# Patient Record
Sex: Male | Born: 1975 | Race: White | Hispanic: No | Marital: Married | Smoking: Never smoker
Health system: Southern US, Community
[De-identification: ages and names within clinical notes are randomized; demographics above are authoritative.]

## PROBLEM LIST (undated history)

## (undated) DIAGNOSIS — K4090 Unilateral inguinal hernia, without obstruction or gangrene, not specified as recurrent: Secondary | ICD-10-CM

## (undated) DIAGNOSIS — F32A Depression, unspecified: Secondary | ICD-10-CM

## (undated) DIAGNOSIS — K219 Gastro-esophageal reflux disease without esophagitis: Secondary | ICD-10-CM

## (undated) DIAGNOSIS — F329 Major depressive disorder, single episode, unspecified: Secondary | ICD-10-CM

## (undated) DIAGNOSIS — K589 Irritable bowel syndrome without diarrhea: Secondary | ICD-10-CM

## (undated) DIAGNOSIS — R7303 Prediabetes: Secondary | ICD-10-CM

## (undated) DIAGNOSIS — K2970 Gastritis, unspecified, without bleeding: Secondary | ICD-10-CM

## (undated) DIAGNOSIS — I739 Peripheral vascular disease, unspecified: Secondary | ICD-10-CM

## (undated) DIAGNOSIS — U071 COVID-19: Secondary | ICD-10-CM

## (undated) DIAGNOSIS — F419 Anxiety disorder, unspecified: Secondary | ICD-10-CM

## (undated) DIAGNOSIS — K529 Noninfective gastroenteritis and colitis, unspecified: Secondary | ICD-10-CM

## (undated) DIAGNOSIS — G4700 Insomnia, unspecified: Secondary | ICD-10-CM

## (undated) DIAGNOSIS — IMO0002 Reserved for concepts with insufficient information to code with codable children: Secondary | ICD-10-CM

## (undated) DIAGNOSIS — T7840XA Allergy, unspecified, initial encounter: Secondary | ICD-10-CM

## (undated) DIAGNOSIS — N419 Inflammatory disease of prostate, unspecified: Secondary | ICD-10-CM

## (undated) HISTORY — DX: Anxiety disorder, unspecified: F41.9

## (undated) HISTORY — DX: Reserved for concepts with insufficient information to code with codable children: IMO0002

## (undated) HISTORY — DX: Gilbert syndrome: E80.4

## (undated) HISTORY — DX: Gastro-esophageal reflux disease without esophagitis: K21.9

## (undated) HISTORY — DX: Allergy, unspecified, initial encounter: T78.40XA

## (undated) HISTORY — PX: SPINE SURGERY: SHX786

## (undated) HISTORY — DX: Irritable bowel syndrome, unspecified: K58.9

## (undated) HISTORY — PX: LASIK: SHX215

## (undated) HISTORY — DX: Insomnia, unspecified: G47.00

## (undated) HISTORY — DX: Gastritis, unspecified, without bleeding: K29.70

## (undated) HISTORY — PX: EYE SURGERY: SHX253

## (undated) HISTORY — DX: Inflammatory disease of prostate, unspecified: N41.9

## (undated) HISTORY — DX: Depression, unspecified: F32.A

## (undated) HISTORY — DX: Unilateral inguinal hernia, without obstruction or gangrene, not specified as recurrent: K40.90

## (undated) HISTORY — PX: BACK SURGERY: SHX140

## (undated) HISTORY — DX: Major depressive disorder, single episode, unspecified: F32.9

## (undated) HISTORY — DX: Noninfective gastroenteritis and colitis, unspecified: K52.9

---

## 1997-12-03 ENCOUNTER — Emergency Department (HOSPITAL_COMMUNITY): Admission: EM | Admit: 1997-12-03 | Discharge: 1997-12-03 | Payer: Self-pay | Admitting: Emergency Medicine

## 1999-01-11 ENCOUNTER — Encounter: Admission: RE | Admit: 1999-01-11 | Discharge: 1999-01-26 | Payer: Self-pay | Admitting: Family Medicine

## 2000-12-06 ENCOUNTER — Encounter: Payer: Self-pay | Admitting: Family Medicine

## 2000-12-06 ENCOUNTER — Encounter: Admission: RE | Admit: 2000-12-06 | Discharge: 2000-12-06 | Payer: Self-pay | Admitting: Family Medicine

## 2004-06-09 ENCOUNTER — Encounter: Admission: RE | Admit: 2004-06-09 | Discharge: 2004-06-09 | Payer: Self-pay | Admitting: Occupational Medicine

## 2004-09-20 HISTORY — PX: ESOPHAGOGASTRODUODENOSCOPY: SHX1529

## 2004-10-07 ENCOUNTER — Emergency Department (HOSPITAL_COMMUNITY): Admission: EM | Admit: 2004-10-07 | Discharge: 2004-10-08 | Payer: Self-pay | Admitting: Emergency Medicine

## 2004-10-08 ENCOUNTER — Ambulatory Visit: Payer: Self-pay | Admitting: Family Medicine

## 2004-10-10 ENCOUNTER — Emergency Department (HOSPITAL_COMMUNITY): Admission: EM | Admit: 2004-10-10 | Discharge: 2004-10-10 | Payer: Self-pay | Admitting: Emergency Medicine

## 2004-10-11 ENCOUNTER — Ambulatory Visit: Payer: Self-pay | Admitting: Family Medicine

## 2004-10-15 ENCOUNTER — Ambulatory Visit: Payer: Self-pay | Admitting: Internal Medicine

## 2004-10-19 ENCOUNTER — Ambulatory Visit (HOSPITAL_COMMUNITY): Admission: RE | Admit: 2004-10-19 | Discharge: 2004-10-19 | Payer: Self-pay | Admitting: Internal Medicine

## 2004-10-19 ENCOUNTER — Ambulatory Visit: Payer: Self-pay | Admitting: Internal Medicine

## 2004-10-20 ENCOUNTER — Ambulatory Visit: Payer: Self-pay | Admitting: Family Medicine

## 2005-02-02 ENCOUNTER — Inpatient Hospital Stay: Payer: Self-pay | Admitting: Unknown Physician Specialty

## 2005-02-03 ENCOUNTER — Encounter: Payer: Self-pay | Admitting: Gastroenterology

## 2005-02-07 ENCOUNTER — Encounter: Payer: Self-pay | Admitting: Gastroenterology

## 2005-02-08 ENCOUNTER — Encounter: Payer: Self-pay | Admitting: Gastroenterology

## 2005-02-11 ENCOUNTER — Ambulatory Visit: Payer: Self-pay | Admitting: Gastroenterology

## 2005-02-11 ENCOUNTER — Encounter: Payer: Self-pay | Admitting: Gastroenterology

## 2005-02-17 ENCOUNTER — Ambulatory Visit: Payer: Self-pay | Admitting: Family Medicine

## 2005-03-17 ENCOUNTER — Encounter: Payer: Self-pay | Admitting: Gastroenterology

## 2005-03-24 ENCOUNTER — Ambulatory Visit: Payer: Self-pay | Admitting: Gastroenterology

## 2005-03-24 ENCOUNTER — Encounter: Payer: Self-pay | Admitting: Gastroenterology

## 2005-04-26 ENCOUNTER — Encounter: Payer: Self-pay | Admitting: Gastroenterology

## 2005-04-28 ENCOUNTER — Ambulatory Visit: Payer: Self-pay | Admitting: Family Medicine

## 2005-05-27 ENCOUNTER — Ambulatory Visit: Payer: Self-pay | Admitting: Family Medicine

## 2005-06-08 ENCOUNTER — Ambulatory Visit: Payer: Self-pay | Admitting: Family Medicine

## 2005-06-09 ENCOUNTER — Ambulatory Visit: Payer: Self-pay | Admitting: Gastroenterology

## 2005-06-27 ENCOUNTER — Ambulatory Visit: Payer: Self-pay | Admitting: Family Medicine

## 2005-11-11 ENCOUNTER — Ambulatory Visit: Payer: Self-pay | Admitting: Family Medicine

## 2006-05-19 ENCOUNTER — Ambulatory Visit: Payer: Self-pay | Admitting: Family Medicine

## 2006-05-23 DIAGNOSIS — N419 Inflammatory disease of prostate, unspecified: Secondary | ICD-10-CM

## 2006-05-23 HISTORY — DX: Inflammatory disease of prostate, unspecified: N41.9

## 2006-08-07 ENCOUNTER — Emergency Department: Payer: Self-pay | Admitting: Emergency Medicine

## 2006-08-07 ENCOUNTER — Encounter: Payer: Self-pay | Admitting: Gastroenterology

## 2007-05-24 HISTORY — PX: BACK SURGERY: SHX140

## 2007-05-30 ENCOUNTER — Ambulatory Visit: Payer: Self-pay | Admitting: Gastroenterology

## 2007-05-30 LAB — CONVERTED CEMR LAB
Albumin: 4 g/dL (ref 3.5–5.2)
BUN: 14 mg/dL (ref 6–23)
Basophils Relative: 0.1 % (ref 0.0–1.0)
CO2: 31 meq/L (ref 19–32)
Calcium: 9.6 mg/dL (ref 8.4–10.5)
Chloride: 103 meq/L (ref 96–112)
Creatinine, Ser: 0.9 mg/dL (ref 0.4–1.5)
Eosinophils Relative: 1.1 % (ref 0.0–5.0)
GFR calc Af Amer: 128 mL/min
GFR calc non Af Amer: 106 mL/min
HCT: 44.5 % (ref 39.0–52.0)
Hemoglobin: 15.3 g/dL (ref 13.0–17.0)
Lipase: 20 units/L (ref 11.0–59.0)
Lymphocytes Relative: 28.1 % (ref 12.0–46.0)
Monocytes Absolute: 0.6 10*3/uL (ref 0.2–0.7)
Monocytes Relative: 11.4 % — ABNORMAL HIGH (ref 3.0–11.0)
Neutro Abs: 3.1 10*3/uL (ref 1.4–7.7)
Neutrophils Relative %: 59.3 % (ref 43.0–77.0)
TSH: 1.29 microintl units/mL (ref 0.35–5.50)
Tissue Transglutaminase Ab, IgA: 0.5 units (ref <7)

## 2007-06-10 ENCOUNTER — Encounter: Admission: RE | Admit: 2007-06-10 | Discharge: 2007-06-10 | Payer: Self-pay | Admitting: Gastroenterology

## 2007-08-30 ENCOUNTER — Ambulatory Visit: Payer: Self-pay | Admitting: Family Medicine

## 2007-08-30 ENCOUNTER — Telehealth (INDEPENDENT_AMBULATORY_CARE_PROVIDER_SITE_OTHER): Payer: Self-pay | Admitting: Internal Medicine

## 2007-08-30 DIAGNOSIS — J309 Allergic rhinitis, unspecified: Secondary | ICD-10-CM | POA: Insufficient documentation

## 2007-10-27 ENCOUNTER — Encounter: Admission: RE | Admit: 2007-10-27 | Discharge: 2007-10-27 | Payer: Self-pay | Admitting: Orthopedic Surgery

## 2007-11-13 ENCOUNTER — Encounter (INDEPENDENT_AMBULATORY_CARE_PROVIDER_SITE_OTHER): Payer: Self-pay | Admitting: Internal Medicine

## 2007-11-14 ENCOUNTER — Encounter (INDEPENDENT_AMBULATORY_CARE_PROVIDER_SITE_OTHER): Payer: Self-pay | Admitting: Internal Medicine

## 2007-11-14 DIAGNOSIS — M5126 Other intervertebral disc displacement, lumbar region: Secondary | ICD-10-CM | POA: Insufficient documentation

## 2007-11-28 ENCOUNTER — Ambulatory Visit (HOSPITAL_COMMUNITY): Admission: RE | Admit: 2007-11-28 | Discharge: 2007-11-29 | Payer: Self-pay | Admitting: Orthopedic Surgery

## 2008-03-17 ENCOUNTER — Ambulatory Visit: Payer: Self-pay | Admitting: Family Medicine

## 2008-03-21 ENCOUNTER — Telehealth: Payer: Self-pay | Admitting: Family Medicine

## 2008-05-23 ENCOUNTER — Emergency Department: Payer: Self-pay | Admitting: Unknown Physician Specialty

## 2008-07-28 ENCOUNTER — Ambulatory Visit: Payer: Self-pay | Admitting: Family Medicine

## 2008-09-01 ENCOUNTER — Ambulatory Visit: Payer: Self-pay | Admitting: Family Medicine

## 2008-09-01 DIAGNOSIS — F341 Dysthymic disorder: Secondary | ICD-10-CM | POA: Insufficient documentation

## 2008-09-02 ENCOUNTER — Encounter: Payer: Self-pay | Admitting: Family Medicine

## 2008-10-07 ENCOUNTER — Ambulatory Visit: Payer: Self-pay | Admitting: Family Medicine

## 2009-06-29 ENCOUNTER — Encounter: Payer: Self-pay | Admitting: Endocrinology

## 2009-06-29 ENCOUNTER — Encounter: Payer: Self-pay | Admitting: Family Medicine

## 2009-09-18 ENCOUNTER — Encounter: Payer: Self-pay | Admitting: Family Medicine

## 2009-09-18 ENCOUNTER — Encounter: Payer: Self-pay | Admitting: Endocrinology

## 2009-09-28 ENCOUNTER — Ambulatory Visit: Payer: Self-pay | Admitting: Gastroenterology

## 2009-09-28 ENCOUNTER — Encounter: Payer: Self-pay | Admitting: Endocrinology

## 2009-09-28 DIAGNOSIS — R1011 Right upper quadrant pain: Secondary | ICD-10-CM | POA: Insufficient documentation

## 2009-09-28 DIAGNOSIS — K580 Irritable bowel syndrome with diarrhea: Secondary | ICD-10-CM | POA: Insufficient documentation

## 2009-09-28 DIAGNOSIS — K589 Irritable bowel syndrome without diarrhea: Secondary | ICD-10-CM

## 2009-09-28 DIAGNOSIS — R197 Diarrhea, unspecified: Secondary | ICD-10-CM | POA: Insufficient documentation

## 2009-09-29 ENCOUNTER — Ambulatory Visit: Payer: Self-pay | Admitting: Family Medicine

## 2009-09-29 DIAGNOSIS — R5381 Other malaise: Secondary | ICD-10-CM | POA: Insufficient documentation

## 2009-09-29 DIAGNOSIS — W57XXXA Bitten or stung by nonvenomous insect and other nonvenomous arthropods, initial encounter: Secondary | ICD-10-CM | POA: Insufficient documentation

## 2009-09-29 DIAGNOSIS — R5383 Other fatigue: Secondary | ICD-10-CM | POA: Insufficient documentation

## 2009-09-29 DIAGNOSIS — T148 Other injury of unspecified body region: Secondary | ICD-10-CM | POA: Insufficient documentation

## 2009-10-01 ENCOUNTER — Ambulatory Visit: Payer: Self-pay | Admitting: Internal Medicine

## 2009-10-01 DIAGNOSIS — K4090 Unilateral inguinal hernia, without obstruction or gangrene, not specified as recurrent: Secondary | ICD-10-CM | POA: Insufficient documentation

## 2009-10-02 ENCOUNTER — Ambulatory Visit: Payer: Self-pay | Admitting: Urology

## 2009-10-02 ENCOUNTER — Encounter: Payer: Self-pay | Admitting: Family Medicine

## 2009-10-02 ENCOUNTER — Encounter: Payer: Self-pay | Admitting: Endocrinology

## 2009-10-07 ENCOUNTER — Encounter: Payer: Self-pay | Admitting: Endocrinology

## 2009-10-12 ENCOUNTER — Ambulatory Visit: Payer: Self-pay | Admitting: Urology

## 2009-10-12 ENCOUNTER — Encounter: Payer: Self-pay | Admitting: Endocrinology

## 2009-10-13 ENCOUNTER — Encounter: Payer: Self-pay | Admitting: Gastroenterology

## 2009-10-15 ENCOUNTER — Encounter: Payer: Self-pay | Admitting: Family Medicine

## 2009-10-26 ENCOUNTER — Ambulatory Visit: Payer: Self-pay | Admitting: Family Medicine

## 2009-10-26 DIAGNOSIS — H6690 Otitis media, unspecified, unspecified ear: Secondary | ICD-10-CM | POA: Insufficient documentation

## 2009-10-28 ENCOUNTER — Ambulatory Visit: Payer: Self-pay | Admitting: Gastroenterology

## 2009-11-02 ENCOUNTER — Encounter: Payer: Self-pay | Admitting: Endocrinology

## 2009-11-12 ENCOUNTER — Ambulatory Visit: Payer: Self-pay | Admitting: Gastroenterology

## 2009-11-13 ENCOUNTER — Ambulatory Visit: Payer: Self-pay | Admitting: Endocrinology

## 2009-11-13 DIAGNOSIS — E229 Hyperfunction of pituitary gland, unspecified: Secondary | ICD-10-CM | POA: Insufficient documentation

## 2009-12-09 ENCOUNTER — Encounter: Payer: Self-pay | Admitting: Endocrinology

## 2009-12-14 ENCOUNTER — Ambulatory Visit: Payer: Self-pay

## 2009-12-18 ENCOUNTER — Encounter: Payer: Self-pay | Admitting: Family Medicine

## 2010-01-07 ENCOUNTER — Ambulatory Visit: Payer: Self-pay | Admitting: Internal Medicine

## 2010-01-07 DIAGNOSIS — R112 Nausea with vomiting, unspecified: Secondary | ICD-10-CM | POA: Insufficient documentation

## 2010-01-13 ENCOUNTER — Encounter (INDEPENDENT_AMBULATORY_CARE_PROVIDER_SITE_OTHER): Payer: Self-pay | Admitting: *Deleted

## 2010-02-11 ENCOUNTER — Encounter: Payer: Self-pay | Admitting: Endocrinology

## 2010-05-23 HISTORY — PX: LASIK: SHX215

## 2010-06-22 NOTE — Letter (Signed)
[facility]: [facility]   Imported By: Lanelle Bal 12/25/2009 13:44:29  _____________________________________________________________________  External Attachment:    Type:   Image     Comment:   External Document

## 2010-06-22 NOTE — Letter (Signed)
Summary: [facility]   Imported By: Sherian Rein 09/29/2009 07:17:52  _____________________________________________________________________  External Attachment:    Type:   Image     Comment:   External Document

## 2010-06-22 NOTE — Letter (Signed)
Summary: [facility]   Imported By: Sherian Rein 09/29/2009 07:18:42  _____________________________________________________________________  External Attachment:    Type:   Image     Comment:   External Document

## 2010-06-22 NOTE — Assessment & Plan Note (Signed)
Summary: R EAR/CLE   Vital Signs:  Patient profile:   35 year old male Height:      73 inches Weight:      178.38 pounds BMI:     23.62 Temp:     98.7 degrees F oral Pulse rate:   80 / minute Pulse rhythm:   regular BP sitting:   102 / 62  (left arm) Cuff size:   regular  Vitals Entered By: Linde Gillis CMA Duncan Dull) (October 26, 2009 3:40 PM) CC: right ear pain   History of Present Illness: 35 yo with Right ear pain, getting worse over last several days.  Started out with runny nose and sneezing which has resolved. No fevers, cough or other symptoms. [place].  No drainage or difficulty hearing.  Current Medications (verified): 1)  Wellbutrin Xl 150 Mg Xr24h-Tab (Bupropion Hcl) .Marland Kitchen.. 1 By Mouth Once Daily 2)  Glycopyrrolate 2 Mg Tabs (Glycopyrrolate) .... One Tablet By Mouth Two Times A Day 3)  Amoxicillin 500 Mg Tabs (Amoxicillin) .Marland Kitchen.. 1 Tab By Mouth Two Times A Day X 10 Days  Allergies (verified): No Known Drug Allergies  Review of Systems      See HPI General:  Denies chills, fatigue, and fever. ENT:  Complains of earache and nasal congestion; denies ear discharge, sinus pressure, and sore throat. Resp:  Denies cough.  Physical Exam  General:  Well-developed,well-nourished,in no acute distress; alert,appropriate and cooperative throughout examination Ears:  R TM erythema and dull.   Left TM normal Nose:  no nasal discharge.   Mouth:  pharynx pink and moist.   Lungs:  Normal respiratory effort, chest expands symmetrically. Lungs are clear to auscultation, no crackles or wheezes. Heart:  Normal rate and regular rhythm. S1 and S2 normal without gallop, murmur, click, rub or other extra sounds. Psych:  nl affect    Impression & Recommendations:  Problem # 1:  OTITIS MEDIA, ACUTE, RIGHT (ICD-382.9) Assessment New Amoxicillin 500 mg two times a day x 10 days. Ibuprofen as needed pain.  Follow up if no resolution of  symptoms. The following medications were removed from the medication list:    Doxycycline Hyclate 100 Mg Caps (Doxycycline hyclate) .Marland Kitchen... 1 by mouth two times a day for 10 days His updated medication list for this problem includes:    Amoxicillin 500 Mg Tabs (Amoxicillin) .Marland Kitchen... 1 tab by mouth two times a day x 10 days  Complete Medication List: 1)  Wellbutrin Xl 150 Mg Xr24h-tab (Bupropion hcl) .Marland Kitchen.. 1 by mouth once daily 2)  Glycopyrrolate 2 Mg Tabs (Glycopyrrolate) .... One tablet by mouth two times a day 3)  Amoxicillin 500 Mg Tabs (Amoxicillin) .Marland Kitchen.. 1 tab by mouth two times a day x 10 days Prescriptions: AMOXICILLIN 500 MG TABS (AMOXICILLIN) 1 tab by mouth two times a day x 10 days  #20 x 0   Entered and Authorized by:   Ruthe Mannan MD   Signed by:   Ruthe Mannan MD on 10/26/2009   Method used:   Electronically to        [facility]* (retail)       [address]       [place], [place]  [address]       Ph: [phone]       Fax: [phone]   RxID:   [id number]   Current Allergies (reviewed today): No known allergies

## 2010-06-22 NOTE — Assessment & Plan Note (Signed)
Summary: THROWING UP AFTER HE EATS/CLE   Vital Signs:  Patient profile:   35 year old male Weight:      174.75 pounds Temp:     98.7 degrees F oral Pulse rate:   84 / minute Pulse rhythm:   regular BP sitting:   122 / 78  (left arm) Cuff size:   regular  Vitals Entered By: Selena Batten Dance CMA Duncan Dull) (January 07, 2010 3:11 PM) CC: Vomitting after eating   History of Present Illness: CC: vomiting after all food   Sick 2 wks ago, flu like sxs - body aches, nausea, diarrhea, mild cough.  No vomiting, ST, congestion, fevers.  Did get better over weekend but around 12/28/09 started getting sick after every meal.  About 15-20 min after meal feels epigastric pain, then has nausea that progresses to vomiting.  After emesis x 1 feels normal.  Emesis clear, mucousy, yellow.  Diarrhea has continued, 3x/day, yellow and watery.  No blood in stool or emesis.  has noted nausea/vomiting worse with standing, improved with sitting.  ROS: + red eyes.  No f/c/joint or muscle pain, chest pain, eye pain, skin rash/change or oral lesions.  Tolerates fluids, but unable to tolerate food.  Today for lunch had fried shrimp, yesterday dinner had chicken, vomited afterwards both times.  Thinks may be developing aversion to meals to prevent vomiting.  Appetite intact.  No sick contacts or similar sxs in family at home.  No smokers at home.    sees Dr. Russella Dar of [facility], dx with IBS.  Normally with diarrhea.  On glycopyrrolate for this.  Also on wellbutrin.  Reviewing chart, extensive GI w/u in past by multiple GI docs including: Recent abd CT unremarkable x small fat containing R inguinal hernia (09/2009).  Recent blood work WNL x Tbili 1.4 (09/2009).  Per GI note, colonoscopy 2006 normal with normal random biopsies.  Normal ANCA, ANA and anti- tissue transglutaminase Ab in 2009.  Allergies (verified): No Known Drug Allergies  Past History:  Past Medical History: depression/ anxiety insomnia   gastritis gerd gilbert's syndrome (hyperbilirubinemia) prostatitis 08 deg disk disease  [name]- Dr Shon Baton  urology- Achilles Dunk  counselor   Social History: Reviewed history from 09/28/2009 and no changes required. married  no smoking  no alcohol Illicit Drug Use - no Occupation: Pharmacologist  Review of Systems       per hpi  Physical Exam  General:  Well-developed,well-nourished,in no acute distress; alert,appropriate and cooperative throughout examination.  vitals reviewed, afebrile, normal, nontoxic Eyes:  + conjunctival injection Mouth:  Oral mucosa and oropharynx without lesions or exudates.  Teeth in good repair. MMM Lungs:  Normal respiratory effort, chest expands symmetrically. Lungs are clear to auscultation, no crackles or wheezes. Heart:  Normal rate and regular rhythm. S1 and S2 normal without gallop, murmur, click, rub or other extra sounds. Abdomen:  hyperactive BS, soft, NTND, no masses appreciated, no HSM Pulses:  2+ periph pulses Extremities:  No clubbing, cyanosis, edema, or deformity noted with normal full range of motion of all joints.   Skin:  no rash Psych:  nl affect    Impression & Recommendations:  Problem # 1:  NAUSEA AND VOMITING (ICD-787.01) Assessment New Vitals stable.  nontoxic today.  No red flags on exam.  Treat symptomatically with zofran, phenergan in case cannot afford zofran.  RTC in 1 wk to see if improvement.  If not, rec f/u with Dr. Russella Dar for recheck.  Extensive  GI w/u in past, consider functional nausea/vomiting vs gastroparesis vs other.  Will need to verify not using MJ next visit.  will have pt return for labwork.  Both wellbutrin and glycopyrrolate have side effects of n/v, however not consistent with current presentation.  Centricity down today, but recommended pt start diary of emesis and foods he ate prior.  To see if any foods precipitate vomiting, and if any foods better  tolerated.  Rec also stick to bland foods.  To call us if cannot keep meds down, could change to suppositories.  Complete Medication List: 1)  Wellbutrin Xl 150 Mg Xr24h-tab (Bupropion hcl) .Marland Kitchen.. 1 by mouth once daily 2)  Glycopyrrolate 2 Mg Tabs (Glycopyrrolate) .Marland Kitchen.. 1 1/2 tablet by mouth once daily as needed for abdominal pain 3)  Zofran 4 Mg Tabs (Ondansetron hcl) .... One q4 hours as needed nausea 4)  Promethazine Hcl 25 Mg Tabs (Promethazine hcl) .... One by mouth q6h as needed nausea Prescriptions: PROMETHAZINE HCL 25 MG TABS (PROMETHAZINE HCL) one by mouth q6h as needed nausea  #30 x 0   Entered and Authorized by:   Eustaquio Boyden  MD   Signed by:   Eustaquio Boyden  MD on 01/07/2010   Method used:   Handwritten   RxID:   [id number] ZOFRAN 4 MG TABS (ONDANSETRON HCL) one q4 hours as needed nausea  #30 x 0   Entered and Authorized by:   Eustaquio Boyden  MD   Signed by:   Eustaquio Boyden  MD on 01/07/2010   Method used:   Handwritten   RxID:   [id number]   Current Allergies (reviewed today): No known allergies   Appended Document: THROWING UP AFTER HE EATS/CLE  can we have pt return for blood work when possible?  CMP, CBC, Lipase [787.01]  Eustaquio Boyden  MD  January 07, 2010 11:27 PM  Left message on mail to return my call. Kim Dance CMA Duncan Dull)  January 08, 2010 9:13 AM   Advised pt, lab appt made.            Lowella Petties CMA  January 08, 2010 11:57 AM  Clinical Lists Changes

## 2010-06-22 NOTE — Procedures (Signed)
Summary: Upper GI Endoscopy/[facility]  Upper GI Endoscopy/[facility]   Imported By: Sherian Rein 09/29/2009 07:14:19  _____________________________________________________________________  External Attachment:    Type:   Image     Comment:   External Document

## 2010-06-22 NOTE — Letter (Signed)
Summary: [facility]   Imported By: Lanelle Bal 10/26/2009 08:44:34  _____________________________________________________________________  External Attachment:    Type:   Image     Comment:   External Document

## 2010-06-22 NOTE — Letter (Signed)
Summary: [facility]   Imported By: Lester Robinson 10/26/2009 11:11:23  _____________________________________________________________________  External Attachment:    Type:   Image     Comment:   External Document

## 2010-06-22 NOTE — Consult Note (Signed)
Summary: Endocrinology/[facility]  [facility]inology/[facility]   Imported By: Sherian Rein 02/19/2010 09:35:38  _____________________________________________________________________  External Attachment:    Type:   Image     Comment:   External Document

## 2010-06-22 NOTE — Procedures (Signed)
Summary: Colonoscopy/[facility]  Colonoscopy/[facility]   Imported By: Sherian Rein 09/29/2009 07:16:51  _____________________________________________________________________  External Attachment:    Type:   Image     Comment:   External Document

## 2010-06-22 NOTE — Procedures (Signed)
Summary: EGD   EGD  Procedure date:  10/19/2004  Findings:      Findings: Gastritis  Location: [facility]    EGD  Procedure date:  10/19/2004  Findings:      Findings: Gastritis  Location: [facility]   Patient Name: Sergio Garcia, Sergio l. MRN: [id number] Procedure Procedures: Panendoscopy (EGD) CPT: 43235.    with biopsy(s)/brushing(s). CPT: D1846139.  Personnel: Endoscopist: Maegen Wigle L. Juanda Chance, MD.  Referred By: Roxy Manns, MD.  Exam Location: Exam performed in Outpatient Clinic. Outpatient  Patient Consent: Procedure, Alternatives, Risks and Benefits discussed, consent obtained, from patient. Consent was obtained by the RN.  Indications Symptoms: Nausea. Vomiting. Hematemesis. Last bleeding episode >48 hours ago,  History  Current Medications: Patient is not currently taking Coumadin.  Pre-Exam Physical: Performed Oct 19, 2004  Entire physical exam was normal.  Exam Exam Info: Maximum depth of insertion Duodenum, intended Duodenum. Vocal cords visualized. Gastric retroflexion performed. Images taken. ASA Classification: I. Tolerance: good.  Sedation Meds: Patient assessed and found to be appropriate for moderate (conscious) sedation. Fentanyl 50 mcg. given IV. Versed 6 mg. given IV. Cetacaine Spray 2 sprays given aerosolized.  Monitoring: BP and pulse monitoring done. Oximetry used. Supplemental O2 given  Findings - Normal: Distal Esophagus. Comments: no M-Weiss tear.  - MUCOSAL ABNORMALITY: Body to Antrum. Erythematous mucosa. RUT done, results pending. ICD9: Gastritis, Acute: 535.00. Comment: mild antral erythema, no erosions.   Assessment Normal examination.  Diagnoses: 535.00: Gastritis, Acute.   Comments: nothing to account for vomiting and abd. pain Events  Unplanned Intervention: No unplanned interventions were required.  Unplanned Events: There were no complications. Plans Medication(s): PPI: Aciphex 20 mg QAM,  starting Oct 19, 2004  Phenergan: 25mg  pc, starting Oct 19, 2004   Comments: if vomiting continues, will continue evaluation  with either CT scan or ultrasound of the abdomen, r/o functional vomiting Disposition: After procedure patient sent to recovery. After recovery patient sent home.  This report was created from the original endoscopy report, which was reviewed and signed by the above listed endoscopist.    cc: Roxy Manns, MD

## 2010-06-22 NOTE — Assessment & Plan Note (Signed)
Summary: UROLOGIST HAS DONE EVERYTHING HE CAN DO & HIS REFERRING HIM B...   History of Present Illness Visit Type: Follow-up Visit Primary GI MD: Elie Goody MD [facility] Chief Complaint: RLQ pain persists with urgency/loose stools after eating History of Present Illness:   This is a 35 year old male has had chronic problems with right-sided abdominal pain. He relates pain frequently radiating into the right testicle as well and has been diagnosed with chronic prostatitis by Dr. Achilles Dunk. He has frequent postprandial right-sided abdominal pain, primarily in the right upper quadrant associated with urgent, loose postprandial bowel movements without bleeding. Colonoscopy performed in September 2006 was normal with normal random biopsies. His last abdominal ultrasound was performed on March 2008 and was normal.   GI Review of Systems    Reports abdominal pain.     Location of  Abdominal pain: RUQ.    Denies acid reflux, belching, bloating, chest pain, dysphagia with liquids, dysphagia with solids, heartburn, loss of appetite, nausea, vomiting, vomiting blood, weight loss, and  weight gain.      Reports change in bowel habits and  diarrhea.     Denies anal fissure, black tarry stools, constipation, diverticulosis, fecal incontinence, heme positive stool, hemorrhoids, irritable bowel syndrome, jaundice, light color stool, liver problems, rectal bleeding, and  rectal pain.   Current Medications (verified): 1)  Wellbutrin Xl 300 Mg Xr24h-Tab (Bupropion Hcl) .Marland Kitchen.. 1 By Mouth Each Am 2)  Rapaflo 8 Mg Caps (Silodosin) .Marland Kitchen.. 1 By Mouth Once Daily  Allergies (verified): No Known Drug Allergies  Past History:  Past Medical History: depression/ anxiety insomnia  gastritis gerd gilbert's syndrome (hyperbilirubinemia) prostatitis 08 deg disk disease  [name] ortho- Dr Shon Baton  urology- Achilles Dunk  counselor   Past Surgical History: Reviewed history from 09/01/2008 and no changes  required. myringotomy tubes 5/06 EGD gastritis (acute), duodenitis  9/06 colonoscopy- ? 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 Family History of Colon Cancer:Grandfather Family History of Colon Polyps:Mother Family History of Heart Disease: Father Family History of Prostate Cancer:Father Family History of Pancreatic Cancer:Grandfather  Social History: Reviewed history from 09/01/2008 and no changes required. married  no smoking  no alcohol Illicit Drug Use - no Occupation: [employer] Drug Use:  no  Review of Systems       The pertinent positives and negatives are noted as above and in the HPI. All other ROS were reviewed and were negative.   Vital Signs:  Patient profile:   35 year old male Height:      73 inches Weight:      176 pounds BMI:     23.30 Pulse rate:   64 / minute Pulse rhythm:   regular BP sitting:   98 / 74  (left arm)  Vitals Entered By: Milford Cage NCMA (Sep 28, 2009 9:16 AM)  Physical Exam  General:  Well developed, well nourished, no acute distress. Head:  Normocephalic and atraumatic. Eyes:  PERRLA, no icterus. Ears:  Normal auditory acuity. Mouth:  No deformity or lesions, dentition normal. Neck:  Supple; no masses or thyromegaly. Lungs:  Clear throughout to auscultation. Heart:  Regular rate and rhythm; no murmurs, rubs,  or bruits. Abdomen:  Soft, nontender and nondistended. No masses, hepatosplenomegaly or hernias noted. Normal bowel sounds. Msk:  Symmetrical with no gross deformities. Normal posture. Pulses:  Normal pulses noted. Extremities:  No clubbing,  cyanosis, edema or deformities noted. Neurologic:  Alert and  oriented x4;  grossly normal neurologically. Cervical Nodes:  No significant cervical adenopathy. Inguinal Nodes:  No significant inguinal adenopathy. Psych:  Alert and cooperative.  anxious.  anxious.    Impression & Recommendations:  Problem # 1:  IRRITABLE BOWEL SYNDROME (ICD-564.1) Right-sided abdominal pain, and urgent postprandial bowel movements and extensive prior evaluation unrevealing. This is typical for irritable bowel syndrome. He has been treated with dicyclomine in the past and states that it was not effective. Trial of glycopyrrolate 2 mg twice daily. Schedule abdominal pelvic CT scan to evaluate for other causes of pain.  Problem # 2:  GILBERT'S SYNDROME (ICD-277.4)  Other Orders: CT Abdomen/Pelvis with Contrast (CT Abd/Pelvis w/con)  Patient Instructions: 1)  You have been schedule for a CT scan.  2)  Pick up your prescription from your pharmacy.  3)  Your f/u appointment with Dr. Russella Dar is on 10/28/09 at 10:15am.  4)  The medication list was reviewed and reconciled.  All changed / newly prescribed medications were explained.  A complete medication list was provided to the patient / caregiver.  Prescriptions: GLYCOPYRROLATE 2 MG TABS (GLYCOPYRROLATE) one tablet by mouth two times a day  #60 x 11   Entered by:   Christie Nottingham CMA (AAMA)   Authorized by:   Meryl Dare MD [facility]   Signed by:   Meryl Dare MD FACG on 09/28/2009   Method used:   Electronically to        [facility]* (retail)       [address]       [place], [place]  [address]       Ph: [phone]       Fax: [phone]   RxID:   [id number]

## 2010-06-22 NOTE — Letter (Signed)
Summary: [facility]   Imported By: Sherian Rein 09/29/2009 07:27:43  _____________________________________________________________________  External Attachment:    Type:   Image     Comment:   External Document

## 2010-06-22 NOTE — Consult Note (Signed)
Summary: Endocrine[facility]  [facility]   Imported By: Sherian Rein 12/14/2009 10:54:40  _____________________________________________________________________  External Attachment:    Type:   Image     Comment:   External Document

## 2010-06-22 NOTE — Letter (Signed)
[facility]   Imported By: Maryln Gottron 09/24/2009 10:53:17  _____________________________________________________________________  External Attachment:    Type:   Image     Comment:   External Document

## 2010-06-22 NOTE — Letter (Signed)
Summary: [facility]   Imported By: Lanelle Bal 10/12/2009 08:31:26  _____________________________________________________________________  External Attachment:    Type:   Image     Comment:   External Document

## 2010-06-22 NOTE — Letter (Signed)
Summary: [facility] No Show Letter  [place] [facility]  [address] [place], [place] [address]   Phone: [phone]  Fax: [phone]    01/13/2010 MRN: [id number]  Sergio Garcia [address] [place], [place]  [address]   Dear Mr. GURNEY,   Our records indicate that you missed your scheduled appointment with ___lab__________________ on __8.23.11__________.  Please contact this office to reschedule your appointment as soon as possible.  It is important that you keep your scheduled appointments with your physician, so we can provide you the best care possible.  Please be advised that there may be a charge for "no show" appointments.    Sincerely,    at [facility]

## 2010-06-22 NOTE — Assessment & Plan Note (Signed)
Summary: f/u abd pain/all   History of Present Illness Visit Type: Follow-up Visit Primary GI MD: Elie Goody MD [facility] Primary Provider: Roxy Manns, MD Chief Complaint: abdominal pain x 1 month - CT scan History of Present Illness:   Sergio Garcia returns today. He reports a marked improvement in his right-sided abdominal pain. His pain is only intermittent and minimal at this point. He notes that glycopyrrolate substantially help the pain but it caused weakness and constipation. He decreased the dosage to once daily and still experienced the side effects so he discontinued it. He was evaluated by Dr. Dwain Sarna, who felt that this hernia was not likely causing symptoms and that elective repair should be deferred unless his hernia worsens.   GI Review of Systems    Reports abdominal pain.     Location of  Abdominal pain: right side.    Denies acid reflux, belching, bloating, chest pain, dysphagia with liquids, dysphagia with solids, heartburn, loss of appetite, nausea, vomiting, vomiting blood, weight loss, and  weight gain.      Reports constipation, diarrhea, and  irritable bowel syndrome.     Denies anal fissure, black tarry stools, change in bowel habit, diverticulosis, fecal incontinence, heme positive stool, hemorrhoids, jaundice, light color stool, liver problems, rectal bleeding, and  rectal pain.   Current Medications (verified): 1)  Wellbutrin Xl 150 Mg Xr24h-Tab (Bupropion Hcl) .Marland Kitchen.. 1 By Mouth Once Daily 2)  Glycopyrrolate 2 Mg Tabs (Glycopyrrolate) .... One Tablet By Mouth Two Times A Day 3)  Amoxicillin 500 Mg Tabs (Amoxicillin) .Marland Kitchen.. 1 Tab By Mouth Two Times A Day X 10 Days  Allergies (verified): No Known Drug Allergies  Past History:  Past Medical History: Reviewed history from 09/28/2009 and no changes required. 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 myringotomy tubes 5/06 EGD gastritis (acute), duodenitis  9/06 colonoscopy- ? 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 Family History of Colon Cancer:Grandfather Family History of Colon Polyps:Mother Family History of Heart Disease: Father Family History of Prostate Cancer:Father Family History of Pancreatic Cancer:Grandfather  Social History: Reviewed history from 09/28/2009 and no changes required. married  no smoking  no alcohol Illicit Drug Use - no Occupation: [employer]  Review of Systems       The patient complains of allergy/sinus, depression-new, and headaches-new.  The patient denies anemia, anxiety-new, arthritis/joint pain, back pain, blood in urine, breast changes/lumps, change in vision, confusion, cough, coughing up blood, fainting, fatigue, fever, hearing problems, heart murmur, heart rhythm changes, itching, menstrual pain, muscle pains/cramps, night sweats, nosebleeds, pregnancy symptoms, shortness of breath, skin rash, sleeping problems, sore throat, swelling of feet/legs, swollen lymph glands, thirst - excessive , urination - excessive , urination changes/pain, urine leakage, vision changes, and voice change.    Vital Signs:  Patient profile:   Sergio Garcia Height:      73 inches Weight:      179.13 pounds BMI:     23.72 Pulse rate:   68 / minute Pulse rhythm:   regular BP sitting:   90 / 68  (left arm) Cuff size:   regular  Vitals Entered By: June McMurray CMA Duncan Dull) (October 28, 2009 10:40  AM)  Physical Exam  General:  Well developed, well nourished, no acute distress. Head:  Normocephalic and atraumatic. Mouth:  No deformity or lesions, dentition normal. Lungs:  Clear throughout to  auscultation. Heart:  Regular rate and rhythm; no murmurs, rubs,  or bruits. Abdomen:  Soft, nontender and nondistended. No masses, hepatosplenomegaly or hernias noted. Normal bowel sounds. Psych:  Alert and cooperative. Normal mood and affect.  Impression & Recommendations:  Problem # 1:  IRRITABLE BOWEL SYNDROME (ICD-564.1) Decrease glycopyrrolate to 1 mg q. day as needed for abdominal pain, which is likely related to irritable bowel syndrome. At this point he is not having symptoms and does not need medication. If he experiences side effects at the lower dose of glycopyrrolate will try another antispasmodic. GI followup p.r.n.  Problem # 2:  INGUINAL HERNIA, RIGHT (ICD-550.90) Followup with Dr. Dwain Sarna, if his hernia worsens.  Patient Instructions: 1)  Change robinul forte to 1 1/2 tablet by mouth once daily as needed for abdominal pain. 2)  Please continue current medications.  3)  Copy sent to : Roxy Manns, MD 4)  The medication list was reviewed and reconciled.  All changed / newly prescribed medications were explained.  A complete medication list was provided to the patient / caregiver.

## 2010-06-22 NOTE — Letter (Signed)
Summary: [facility]   Imported By: Lanelle Bal 07/07/2009 07:57:26  _____________________________________________________________________  External Attachment:    Type:   Image     Comment:   External Document

## 2010-06-22 NOTE — Assessment & Plan Note (Signed)
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 test/aj   Primary Provider:  Roxy Manns, MD  CC:  new endo pt/ABN elev. test/aj.  History of Present Illness: pt states few months of slight headache worst at the bifrontal area, and associated fatigue.  he was found to have an elevated testsosterone level.  he says he has never taken exogenous tesosterone.  Current Medications (verified): 1)  Wellbutrin Xl 150 Mg Xr24h-Tab (Bupropion Hcl) .Marland Kitchen.. 1 By Mouth Once Daily 2)  Glycopyrrolate 2 Mg Tabs (Glycopyrrolate) .Marland Kitchen.. 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 Family History of Colon Cancer:Grandfather Family History of Colon Polyps:Mother Family History of Heart Disease: Father Family History of Prostate Cancer:Father Family History of Pancreatic Cancer:Grandfather neg for testosterone problems  Social History: Reviewed history from 09/28/2009 and no changes required. married  no smoking  no alcohol Illicit Drug Use - no Occupation: [employer]  Review of Systems       denies polyuria, numbness, weight change, decreased  urinary stream, gynecomastia, easy bruising, sob, rash, blurry vision, chest pain.  depression is well-controlled.  he has erectile dysfunction, rhinorrhea, and muscle weakness   Physical Exam  General:  normal appearance.   Head:  head: no deformity eyes: no periorbital swelling, no proptosis external nose and ears are normal mouth: no lesion seen Neck:  Supple without thyroid enlargement or tenderness.  Breasts:  no gynecomastia. Lungs:  Clear to auscultation bilaterally. Normal respiratory effort.  Heart:  Regular rate and rhythm without murmurs or gallops noted. Normal S1,S2.   Abdomen:  abdomen is soft, nontender.  no hepatosplenomegaly.   not distended.  no hernia  Genitalia:  Normal external male genitalia with no urethral discharge.  Msk:  muscle bulk and strength are grossly normal.  no obvious joint swelling.  gait is normal and steady  Extremities:  no edema no deformity Neurologic:  cn 2-12 grossly intact.   readily moves all 4's.   sensation is intact to touch on the feet  Skin:  normal hair distribution Cervical Nodes:  No significant adenopathy.  Psych:  Alert and cooperative; normal mood and affect; normal attention span and concentration.   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 253.1  Other Orders: Endocrinology Referral (Endocrine) Consultation Level IV ([address])  Patient Instructions: 1)  check "hCG" level.  here is a prescription to get it checked at [facility]. 2)  i am referring you to an endocrinologist at [facility], [facility], or [facility].  you will be called with a day  and time for an appointment. Prescriptions: HCG 253.1  #0 x 0   Entered and Authorized by:   Minus Breeding MD   Signed by:   Minus Breeding MD on 11/13/2009   Method used:   Print then Give to Patient   RxID:   [id number]

## 2010-06-22 NOTE — Assessment & Plan Note (Signed)
Summary: LOW ENERGY TIC BITE/DLO   Vital Signs:  Patient profile:   35 year old male Height:      73 inches Weight:      180.25 pounds BMI:     23.87 Temp:     98.4 degrees F oral Pulse rate:   68 / minute Pulse rhythm:   regular BP sitting:   118 / 74  (left arm) Cuff size:   regular  Vitals Entered By: Lewanda Rife LPN (Sep 29, 2009 2:59 PM) CC: Tick bite two weeks ago on right side and back of neck. Now low energy and diarrhea for last 2 days   History of Present Illness: in the past few weeks - no energy -- getting worse and worse  did find 2 ticks  one on neck- no spot one on R torso-- and a sore spot remains  has had some other sore red spots not corresponding to ticks  no fever -- no chills but is achey like the flu  little sore throat last week and a little nausea - no vomiting  diarrhea started yesterday-all day today  some abd cramping  no blood in stool   has had tick fever in the past -- when he was 35 years old -does not remember much about it  no sick contacts   ate grilled chicken yesterday for lunch   saw GI for abd pain -- getting CT of abd on thursday for chronic abd pain   Allergies (verified): No Known Drug Allergies  Past History:  Past Medical History: Last updated: 09/28/2009 depression/ anxiety insomnia  gastritis gerd gilbert's syndrome (hyperbilirubinemia) prostatitis 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 Eyes:  Denies blurring, discharge, and eye irritation. ENT:  Denies nasal congestion, postnasal drainage, and sinus pressure. CV:  Denies chest pain or discomfort, palpitations, and swelling of feet. Resp:  Denies cough, shortness of breath, and wheezing. GI:  Complains of abdominal pain; denies bloody stools, change in bowel habits, and indigestion. GU:  Denies dysuria, hematuria, and urinary frequency. MS:  Denies joint redness, joint swelling, and cramps. Derm:  Denies itching, lesion(s), poor wound healing, and rash. Neuro:  Denies numbness, tingling, and weakness. Psych:  mood has been fairly stable. Endo:  Denies cold intolerance, excessive thirst, excessive urination, and heat intolerance. Heme:  Denies abnormal bruising and bleeding.  Physical Exam  General:  Well-developed,well-nourished,in no acute distress; alert,appropriate and cooperative throughout examination Head:  normocephalic, atraumatic, and no abnormalities observed.  no sinus tenderness  Eyes:  vision grossly intact, pupils equal, pupils round, and pupils reactive to light.  no conjunctival pallor, injection or icterus  Nose:  no nasal discharge.   Mouth:  pharynx pink and moist.   Neck:  No deformities, masses, or tenderness noted. Lungs:  Normal respiratory effort, chest expands symmetrically. Lungs are clear to  auscultation, no crackles or wheezes. Heart:  Normal rate and regular rhythm. S1 and S2 normal without gallop, murmur, click, rub or other extra sounds. Abdomen:  mild epigastric tenderness without rebound or gaurding soft, no distention, and no masses.   Msk:  No deformity or scoliosis noted of thoracic or lumbar spine.  no acute joint changes Pulses:  R and L  carotid,radial,femoral,dorsalis pedis and posterior tibial pulses are full and equal bilaterally Extremities:  No clubbing, cyanosis, edema, or deformity noted with normal full range of motion of all joints.   Neurologic:  cranial nerves II-XII intact, sensation intact to light touch, gait normal, and DTRs symmetrical and normal.   Skin:  small scab R side of back (no tick remains) several scabs consistent with healed folliculitis on L thigh no rash Cervical Nodes:  No lymphadenopathy noted Inguinal Nodes:  No significant adenopathy Psych:  nl affect    Impression & Recommendations:  Problem # 1:  MALAISE AND FATIGUE (ICD-780.79) Assessment New with hx of several tick bites (from sound of it more likely dog than deer ticks but not sure) will tx empirically with doxycycline and get labwork  disc stress issues and other symptoms  no rash on exam  Orders: Venipuncture ([address]454) T-Lyme Disease (09811-91478) T- * Misc. Laboratory test [phone]) Specimen Handling (13086) TLB-BMP (Basic Metabolic Panel-BMET) (80048-METABOL) TLB-CBC Platelet - w/Differential (85025-CBCD) TLB-Hepatic/Liver Function Pnl (80076-HEPATIC) TLB-TSH (Thyroid Stimulating Hormone) (57846-NGE) Prescription Created Electronically [phone])  Problem # 2:  TICK BITE (ICD-E906.4) Assessment: New see above  no target or other rash in light of aches and fatigue - tx empirically until labs return Orders: Venipuncture (13244) T-Lyme Disease (01027-25366) T- * Misc. Laboratory test [phone]) Specimen Handling (74259) TLB-BMP (Basic Metabolic Panel-BMET) (80048-METABOL) TLB-CBC Platelet - w/Differential (85025-CBCD) TLB-Hepatic/Liver Function Pnl (80076-HEPATIC) TLB-TSH (Thyroid Stimulating Hormone) (56387-FIE) Prescription Created Electronically [phone])  Problem # 3:  ANXIETY DEPRESSION (ICD-300.4) Assessment: Improved overall doing better - wants to go down on dose of wellbutrin xl to 150 (daw) -- since the 300 makes  him jittery  will do this - sent to pharmacy  update  will watch for worse depression  Complete Medication List: 1)  Wellbutrin Xl 150 Mg Xr24h-tab (Bupropion hcl) .Marland Kitchen.. 1 by mouth once daily 2)  Rapaflo 8 Mg Caps (Silodosin) .Marland Kitchen.. 1 by mouth once daily 3)  Glycopyrrolate 2 Mg Tabs (Glycopyrrolate) .... One tablet by mouth two times a day 4)  Doxycycline Hyclate 100 Mg Caps (Doxycycline hyclate) .Marland Kitchen.. 1 by mouth two times a day for 10 days  Patient Instructions: 1)  checking labs today 2)  start doxycycline 100 mg two times a day with non dairy food  3)  will advise you when labs come back 4)  update me if rash or feeling worse  5)  go down to 150 mg for wellbutrin xl  Prescriptions: DOXYCYCLINE HYCLATE 100 MG CAPS (DOXYCYCLINE HYCLATE) 1 by mouth two times a day for 10 days  #20 x 0   Entered and Authorized by:   Judith Part MD   Signed by:   Judith Part MD on 09/29/2009   Method used:   Electronically to        [facility]* (retail)       [address]       [place], [place]  [address]       Ph: [phone]       Fax: [phone]   RxID:   [id number] WELLBUTRIN XL 150 MG XR24H-TAB (BUPROPION HCL)  1 by mouth once daily Brand medically necessary #90 x 3   Entered and Authorized by:   Judith Part MD   Signed by:   Judith Part MD on 09/29/2009   Method used:   Electronically to        [facility]* (retail)       [address]       [place], [place]  [address]       Ph: [phone]       Fax: [phone]   RxID:   [id number]   Current Allergies (reviewed today): No known allergies

## 2010-10-05 NOTE — Op Note (Signed)
NAME:  Sergio Garcia, Sergio Garcia NO.:  [id number]   MEDICAL RECORD NO.:  [id number]          PATIENT TYPE:  AMB   LOCATION:  DAY                          FACILITY[facility]  [facility]   PHYSICIAN:  Alvy Beal, MD    DATE OF BIRTH:  July 07, 1975   DATE OF PROCEDURE:  DATE OF DISCHARGE:                               OPERATIVE REPORT   PREOPERATIVE DIAGNOSIS:  Left L5-S1 disk herniation.   POSTOPERATIVE DIAGNOSIS:  Left L5-S1 disk herniation.   FIRST ASSISTANT:  Crissie Reese, PA.   OPERATIVE PROCEDURE:  L5-S1 left lumbar laminectomy for diskectomy, CPT  code 54098.   HISTORY:  This is a very pleasant 35 year old gentleman who presents to  my office with complaints of severe left leg pain.  Clinical and  radiographic analysis confirmed the diagnosis of a disk herniation.  Attempts at conservative management have failed to alleviate his  symptoms.  As a result, he elected to proceed with surgery.  All  appropriate risks, benefits and alternatives were discussed with the  patient.  Consent was obtained.   OPERATIVE NOTE:  The patient was brought to the operating room and  placed supine on the operating table.  After the successful induction of  general anesthesia and endotracheal intubation, TEDs and SCDs were  applied.  He was turned prone onto a Wilson frame.  The back was prepped  and draped in a standard fashion.  Two needles were then placed into the  spine and intraoperative x-ray was done to confirm the incision site.  Once confirmed, a small midline incision was made.  Sharp dissection was  carried out down to the deep fascia.  The deep fascia was sharply  incised on the left-hand side and using a Cobb elevator, I stripped the  paraspinal muscles to expose the L5-S1 spinous process and the 5-1  interbody space.   At this point, I then placed a Penfield IV underneath the lamina of L5  and took a second intraoperative x-ray.  Once confirmed I was at the L5  lamina, I then  proceeded with surgery.   Using a 3 mL Kerrison, I performed a laminotomy of L5.  Then using a  microcurette, I released the lamina from the superior aspect of the S1  lamina.  I then used a 2-mm Kerrison to just resect a small portion of  the S1 lamina.  At this point, I then used that same microcurette to  dissect through the ligamentum flavum and then used 2-3 mm Kerrison to  resect the remaining portion of the ligamentum flavum to expose the  underlying thecal sac.   I then swept the thecal sac and nerve root medially to expose a very  large fragment disk herniation.  At this point using a nerve retractor,  I protected the thecal sac and traversing nerve root, incised the  annulus and then resected the disk fragment.  Using micropituitary  rongeur and Epstein curettes, I was able to mobilize the disk fragment  and resect it.  At this point with the diskectomy completed, I examined  the amount and it  was consistent with what was seen on the MRI.  At this  point, I removed all  the retractors and irrigated copiously with normal  saline.  I used bipolar electrocautery to obtain hemostasis.  I then  took a dural elevator (hockey stick) and passed it down the lateral  recess inferiorly, superiorly and medially.  There was no free fragments  of disk her ongoing compression.  I was able to sweep down the lateral  recess towards the neural foramen to ensure that the nerve root itself  was no longer under any undue tension.  Once this was confirmed, I  placed a thrombin-soaked Gelfoam patty over the laminotomy  site and then closed the deep fascia with interrupted #1 Vicryl sutures,  superficial with 2-0 Vicryl sutures and a 3-0 Monocryl for the skin.  Steri-Strips, dry dressing and Mepilex dressing was applied.  The  patient was extubated and transferred to the PACU without incident.  At  the end of the case, all needle and sponge counts were correct.      Alvy Beal, MD   Electronically Signed     DDB/MEDQ  D:  11/28/2007  T:  11/28/2007  Job:  [id number]

## 2010-10-05 NOTE — Assessment & Plan Note (Signed)
[facility]                         GASTROENTEROLOGY OFFICE NOTE   JAMY, CLECKLER                       MRN:          [id number]  DATE:05/30/2007                            DOB:          1976/03/16    CHIEF COMPLAINT:  A 35 year old white male with postprandial right upper  quadrant pain and diarrhea.   HISTORY OF PRESENT ILLNESS:  The patient is here with his wife today.  He is a prior patient of Hedwig Morton. Juanda Chance, M.D. but requested to switch  physicians.  He has also been evaluated by Lynnae Prude, M.D. and Dr.  Barnetta Chapel in [place].  An upper endoscopy performed by Dr.  Juanda Chance in May of 2006 for nausea, vomiting, and hematemesis showed mild  nonerosive antral gastritis.  Rapid urease testing was negative.  He  states that he has undergone colonoscopy previously by Dr. Marva Panda  which revealed a mild abnormality and a biopsy was taken.  He does not  recall the abnormality and records are pending at the time of this  dictation.  His diarrhea complaints have been present for about six  months and they generally start with meals.  He does better with blander  foods at home.  When he is out to eat, he generally has much more urgent  diarrhea.  He notes right upper quadrant pain and then urgent watery,  nonbloody diarrhea.  He has had problems with right upper quadrant pain  for several years and underwent evaluation by Dr. Mechele Collin and Dr.  Marva Panda in 2006.  A mild indirect hyperbilirubinemia was noted in 2006  with a total bilirubin of 1.3 and a direct bilirubin of 0.1.  Repeat  liver function tests in March of 2008 showed normal liver function tests  and normal chemistries except for an elevated total bilirubin of 1.8.  The bilirubin was not fractionated.  An abdominal ultrasound was  performed in March of 2008 which showed no significant abnormality.  He  was treated with Protonix in the past once a day and twice a day which  did not  improve his symptoms.  Per the records from Dr. Marva Panda he was  treated with Bentyl 10 mg p.r.n., but the patient did not recall taking  this medication.  He does not recall that any medication helped his  symptoms.  An abnormal MRCP is noted in the prior ofiice notes, but the  report is not present.  There was a question of ductal narrowing and a  repeat MRCP versus ERCP was suggested in the prior notes.  An ANCA panel  was negative as was an ANA and antimyocardial antibody panel.  He notes  no weight loss, melena, hematochezia, dysphagia, odynophagia, change in  stool caliber, fevers or chills.  He notes no specific food  intolerances.  It seems that almost any food will lead to postprandial  right upper quadrant pain and diarrhea.  He states that his grandfather  had colon cancer and his mother has had colon polyps.   PAST MEDICAL HISTORY:  Anxiety  Depression  and as in the history of present illness.  PAST SURGICAL HISTORY:  Negative.   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  He denies tobacco and  alcohol product usage.   REVIEW OF SYSTEMS:  Entirely negative except for the recent onset of  headaches.   PHYSICAL EXAMINATION:  GENERAL:  Well-nourished, well-developed, anxious  white male.  VITAL SIGNS:  Weight 164 pounds, blood pressure 108/76, pulse 68 and  regular.  HEENT:  Anicteric sclerae, oropharynx clear.  NECK:  Without thyromegaly or adenopathy.  CHEST:  Clear to auscultation bilaterally.  HEART:  Regular rate and rhythm without murmurs.  ABDOMEN:  Soft, nondistended.  Normal active bowel sounds.  Mild right  upper quadrant tenderness to deep palpation.  No rebound or guarding.  No palpable organomegaly, masses, or hernias.  Normal active bowel  sounds.  EXTREMITIES:  Without cyanosis, clubbing, or edema.  NEUROLOGY:  Alert and oriented x3, mildly anxious, grossly nonfocal.    ASSESSMENT:  1. Postprandial right upper quadrant pain and diarrhea.  We will      obtain records from his prior colonoscopy.  Trial of Bentyl q.a.c.      and q.h.s.  If this is not effective, we will consider other      antispasmodics.  Obtain a CBC, CMET, lipase, TSH, and tissue      transglutaminase today.  Return office visit in 3-4 weeks.  2. Suspected Gilbert's syndrome with a mild indirect      hyperbilirubinemia.  3. History of an abnormal MRCP.  Await records.  Consider a repeat      MRCP.  Return office visit in 3-4 weeks.  4. Anxiety with a history of anxiety and depression.  This may be      contributing to his gastrointestinal complaints and this should be      reassessed by his primary physician, Idamae Schuller A. Milinda Antis, M.D.     Venita Lick. Russella Dar, MD, [facility]  Electronically Signed    MTS/MedQ  DD: 05/30/2007  DT: 05/30/2007  Job #: [id number]   cc:   Marne A. Milinda Antis, MD

## 2010-10-12 ENCOUNTER — Encounter: Payer: Self-pay | Admitting: Family Medicine

## 2010-10-13 ENCOUNTER — Ambulatory Visit (INDEPENDENT_AMBULATORY_CARE_PROVIDER_SITE_OTHER): Payer: BC Managed Care – PPO | Admitting: Family Medicine

## 2010-10-13 ENCOUNTER — Encounter: Payer: Self-pay | Admitting: Family Medicine

## 2010-10-13 VITALS — BP 110/68 | HR 64 | Temp 98.1°F | Ht 73.0 in | Wt 188.5 lb

## 2010-10-13 DIAGNOSIS — W57XXXA Bitten or stung by nonvenomous insect and other nonvenomous arthropods, initial encounter: Secondary | ICD-10-CM | POA: Insufficient documentation

## 2010-10-13 DIAGNOSIS — T148 Other injury of unspecified body region: Secondary | ICD-10-CM

## 2010-10-13 MED ORDER — MUPIROCIN 2 % EX OINT: TOPICAL_OINTMENT | CUTANEOUS | Status: DC

## 2010-10-13 MED ORDER — DOXYCYCLINE HYCLATE 100 MG PO TABS: 100.0000 mg | ORAL_TABLET | Freq: Two times a day (BID) | ORAL | Status: AC

## 2010-10-13 NOTE — Assessment & Plan Note (Signed)
Some redness but no target lesion (inner r thigh)  No diffuse rash Some aches and pains  Will empirically tx with tick fever with doxy (has had tick fever in past) bactroban for bite site Disc what to watch for Update if worse or not imp

## 2010-10-13 NOTE — Progress Notes (Signed)
  Subjective:    Patient ID: Sergio Garcia, male    DOB: 1975/11/15, 35 y.o.   MRN: [id number]  HPI Pulled a tick off back of leg last week Was not on there long  Has a rash around site of bite  Leg is achey  End of last week - felt terrible in general No fever - but was achey all over  No joint swelling  Feels a bit better- still wiped out  Today is first day of work since last Thursday   No other tick bite   Size of tick-- medium (so likely not deer tick)   Had tick fever when she was really young  Past Medical History  Diagnosis Date  . Depression   . Anxiety   . GERD (gastroesophageal reflux disease)   . Insomnia   . Gastritis   . Gilbert's syndrome     hyperbilirubinemia  . Prostatitis 2008  . Degenerative disc disease     History   Social History  . Marital Status: Married    Spouse Name: N/A    Number of Children: N/A  . Years of Education: N/A   Occupational History  . Not on file.   Social History Main Topics  . Smoking status: Never Smoker   . Smokeless tobacco: Not on file  . Alcohol Use: No  . Drug Use: No  . Sexually Active:    Other Topics Concern  . Not on file   Social History Narrative  . No narrative on file      Review of Systems Review of Systems  Constitutional: Negative for fever, appetite change, and unexpected weight change. pos for fatigue  Eyes: Negative for pain and visual disturbance.  Respiratory: Negative for cough and shortness of breath.   Cardiovascular: Negative. For cp or palp   Gastrointestinal: Negative for nausea, diarrhea and constipation.  Genitourinary: Negative for urgency and frequency.  Skin: Negative for pallor. pos for redness around tick bite site but neg for rash  MSK pos for achiness, neg for swollen or tender joints  Neurological: Negative for weakness, light-headedness, numbness and headaches.  Hematological: Negative for adenopathy. Does not bruise/bleed easily.  Psychiatric/Behavioral: Negative  for dysphoric mood. The patient is not nervous/anxious.         Objective:   Physical Exam  Constitutional: He appears well-developed and well-nourished.  HENT:  Head: Normocephalic and atraumatic.  Right Ear: External ear normal.  Left Ear: External ear normal.  Nose: Nose normal.  Mouth/Throat: Oropharynx is clear and moist.  Eyes: Conjunctivae and EOM are normal. Pupils are equal, round, and reactive to light.  Neck: Normal range of motion. Neck supple. No JVD present. No thyromegaly present.  Cardiovascular: Normal rate, regular rhythm, normal heart sounds and intact distal pulses.   Pulmonary/Chest: Effort normal and breath sounds normal.  Abdominal: Soft. Bowel sounds are normal.  Musculoskeletal: He exhibits no edema and no tenderness.  Lymphadenopathy:    He has no cervical adenopathy.  Neurological: He is alert. He has normal reflexes. Coordination normal.  Skin: Skin is warm and dry.       Scabbed tick bite site R inner thigh with 1 cm collar of redness that appears to be resolving Improved swelling per pt  No target pattern  Psychiatric: He has a normal mood and affect.          Assessment & Plan:

## 2010-10-13 NOTE — Patient Instructions (Signed)
Use the ointment 3 times daily to tick bite and update me if the redness worsens or does not improve  Take the doxycycline for 14 days- please update me if not improved with other symptoms If fever or worse - alert me

## 2011-06-17 ENCOUNTER — Ambulatory Visit: Payer: BC Managed Care – PPO | Admitting: Internal Medicine

## 2011-06-20 ENCOUNTER — Encounter: Payer: Self-pay | Admitting: Internal Medicine

## 2011-06-20 ENCOUNTER — Ambulatory Visit (INDEPENDENT_AMBULATORY_CARE_PROVIDER_SITE_OTHER): Payer: BC Managed Care – PPO | Admitting: Internal Medicine

## 2011-06-20 ENCOUNTER — Ambulatory Visit: Payer: BC Managed Care – PPO | Admitting: Internal Medicine

## 2011-06-20 VITALS — BP 102/66 | HR 59 | Temp 98.6°F | Ht 74.0 in | Wt 185.0 lb

## 2011-06-20 DIAGNOSIS — J069 Acute upper respiratory infection, unspecified: Secondary | ICD-10-CM | POA: Insufficient documentation

## 2011-06-20 DIAGNOSIS — K589 Irritable bowel syndrome without diarrhea: Secondary | ICD-10-CM

## 2011-06-20 DIAGNOSIS — Z23 Encounter for immunization: Secondary | ICD-10-CM

## 2011-06-20 DIAGNOSIS — Z0000 Encounter for general adult medical examination without abnormal findings: Secondary | ICD-10-CM

## 2011-06-20 NOTE — Assessment & Plan Note (Signed)
Symptoms consistent with Viral URI. Symptoms mild and not currently requiring any medication. If symptoms do not continue to improve over next week, pt will call.

## 2011-06-20 NOTE — Assessment & Plan Note (Signed)
Chronic issue which is unchanged. We discussed using Align, and pt will look into this. Follow up if symptoms worsening or no improvement.

## 2011-06-20 NOTE — Progress Notes (Signed)
Subjective:    Patient ID: Sergio Garcia, male    DOB: [birth year], 36 y.o.   MRN: [id number]  HPI 36 year old male with history of irritable bowel syndrome presents to establish care. He was previously seen by Dr. Milinda Antis in our practice. He reports that he is generally feeling well. He notes that, in regards to his irritable bowel syndrome, he continues to have intermittent episodes of diarrhea. He reports being tried on some medication the past with little benefit. He has never tried probiotics. He denies any abdominal pain. He denies any blood in his stool. He denies any fever or chills.  He also notes that in the last 2 weeks he has had some nasal congestion and yellow to clear sinus drainage. He denies any sinus pressure. He does have occasional cough which is nonproductive. He denies any shortness of breath. He denies any fever or chills. He reports that his symptoms are gradually improving without intervention.  Outpatient Encounter Prescriptions as of 06/20/2011  Medication Sig Dispense Refill  NONE  Review of Systems  Constitutional: Negative for fever, chills, activity change, appetite change, fatigue and unexpected weight change.  HENT: Positive for congestion, rhinorrhea and postnasal drip. Negative for ear pain, sore throat, sneezing and sinus pressure.   Eyes: Negative for visual disturbance.  Respiratory: Positive for cough. Negative for shortness of breath.   Cardiovascular: Negative for chest pain, palpitations and leg swelling.  Gastrointestinal: Positive for diarrhea. Negative for abdominal pain, constipation, blood in stool, abdominal distention and anal bleeding.  Genitourinary: Negative for dysuria, urgency and difficulty urinating.  Musculoskeletal: Negative for arthralgias and gait problem.  Skin: Negative for color change and rash.  Hematological: Negative for adenopathy.  Psychiatric/Behavioral: Negative for sleep disturbance and dysphoric mood. The patient is not  nervous/anxious.    BP 102/66  Pulse 59  Temp(Src) 98.6 F (37 C) (Oral)  Ht 6\' 2"  (1.88 m)  Wt 185 lb (83.915 kg)  BMI 23.75 kg/m2  SpO2 97%     Objective:   Physical Exam  Constitutional: He is oriented to person, place, and time. He appears well-developed and well-nourished. No distress.  HENT:  Head: Normocephalic and atraumatic.  Right Ear: External ear normal.  Left Ear: External ear normal.  Nose: Nose normal.  Mouth/Throat: Oropharynx is clear and moist. No oropharyngeal exudate.  Eyes: Conjunctivae and EOM are normal. Pupils are equal, round, and reactive to light. Right eye exhibits no discharge. Left eye exhibits no discharge. No scleral icterus.  Neck: Normal range of motion. Neck supple. No tracheal deviation present. No thyromegaly present.  Cardiovascular: Normal rate, regular rhythm and normal heart sounds.  Exam reveals no gallop and no friction rub.   No murmur heard. Pulmonary/Chest: Effort normal and breath sounds normal. No respiratory distress. He has no wheezes. He has no rales. He exhibits no tenderness.  Abdominal: Soft. Bowel sounds are normal. He exhibits no distension and no mass. There is no tenderness. There is no rebound and no guarding.  Musculoskeletal: Normal range of motion. He exhibits no edema.  Lymphadenopathy:    He has no cervical adenopathy.  Neurological: He is alert and oriented to person, place, and time. No cranial nerve deficit. Coordination normal.  Skin: Skin is warm and dry. No rash noted. He is not diaphoretic. No erythema. No pallor.  Psychiatric: He has a normal mood and affect. His behavior is normal. Judgment and thought content normal.          Assessment & Plan:

## 2011-06-21 ENCOUNTER — Telehealth: Payer: Self-pay | Admitting: *Deleted

## 2011-06-21 LAB — CBC WITH DIFFERENTIAL/PLATELET
Basophils Absolute: 0.1 10*3/uL (ref 0.0–0.1)
Eosinophils Absolute: 0.1 10*3/uL (ref 0.0–0.7)
HCT: 45.8 % (ref 39.0–52.0)
Hemoglobin: 15.6 g/dL (ref 13.0–17.0)
Lymphs Abs: 1.9 10*3/uL (ref 0.7–4.0)
MCHC: 34.1 g/dL (ref 30.0–36.0)
Monocytes Relative: 8.5 % (ref 3.0–12.0)
Neutro Abs: 2.8 10*3/uL (ref 1.4–7.7)
Platelets: 261 10*3/uL (ref 150.0–400.0)
RDW: 13.3 % (ref 11.5–14.6)

## 2011-06-21 LAB — COMPREHENSIVE METABOLIC PANEL
AST: 32 U/L (ref 0–37)
Alkaline Phosphatase: 56 U/L (ref 39–117)
BUN: 18 mg/dL (ref 6–23)
Calcium: 9.4 mg/dL (ref 8.4–10.5)
Creatinine, Ser: 0.9 mg/dL (ref 0.4–1.5)
Glucose, Bld: 77 mg/dL (ref 70–99)

## 2011-06-21 LAB — LIPID PANEL
Cholesterol: 176 mg/dL (ref 0–200)
HDL: 38 mg/dL — ABNORMAL LOW (ref >39.00)
LDL Cholesterol: 119 mg/dL — ABNORMAL HIGH (ref 0–99)
Total CHOL/HDL Ratio: 5
Triglycerides: 94 mg/dL (ref 0.0–149.0)

## 2011-06-21 NOTE — Telephone Encounter (Signed)
I spoke w/RN w/call a nurse - pt reported rash/hives all over starting around 4 pm yesterday. Emergent symptoms were r/o - RN wanted ok to advise benadryl. I said OK to advise benadryl and she will f/u with pt later today. Pt advised ER/911 w/severe symptoms. Looks like patient has had Tetanus in the past. Do you want to put flu vaccine on his allergy list?

## 2011-06-21 NOTE — Telephone Encounter (Signed)
Done, My chart message sent to pt. Call a nurse will be following up with pt later today. We will see patient this week if symptoms do not improve.

## 2011-06-21 NOTE — Telephone Encounter (Signed)
Let's list both and put probable allergies (Tdap and Flu)

## 2011-06-22 ENCOUNTER — Telehealth: Payer: Self-pay | Admitting: *Deleted

## 2011-06-22 NOTE — Telephone Encounter (Signed)
I called and spoke w/pt to get update on reaction from Monday. He reports that he continues to have rash and itching all over only at night. Benadryl helps relieve symptoms. Advised patient to come into office for eval or call if symptoms persist or become worse, pt agreed.

## 2011-06-28 ENCOUNTER — Encounter: Payer: Self-pay | Admitting: Internal Medicine

## 2011-06-29 ENCOUNTER — Encounter: Payer: Self-pay | Admitting: Internal Medicine

## 2011-06-29 ENCOUNTER — Ambulatory Visit (INDEPENDENT_AMBULATORY_CARE_PROVIDER_SITE_OTHER): Payer: BC Managed Care – PPO | Admitting: Internal Medicine

## 2011-06-29 DIAGNOSIS — L509 Urticaria, unspecified: Secondary | ICD-10-CM | POA: Insufficient documentation

## 2011-06-29 NOTE — Assessment & Plan Note (Signed)
Patient with two-week history of daily episodes of urticaria. He does not have any current rash. However, he brings photos today of rash that he experiences in the evenings. Recent lab work was normal including blood counts, liver and kidney function tests. His exam is otherwise normal. There are no symptoms or physical exam findings to suggest underlying malignancy or other problem. He denies any new exposures. Question if urticaria may be related to exposure in his tetanus, pertussis, or flu vaccine. Will set him up with allergy and immunology for further evaluation.

## 2011-06-29 NOTE — Progress Notes (Signed)
  Subjective:    Patient ID: Sergio Garcia, male    DOB: 10/10/1975, 36 y.o.   MRN: [id number]  HPI 36 year old male who presents for an acute visit complaining of a two-week history of intermittent rash. He reports that his symptoms first began after his last visit. Notably, at that visit he had received both a tetanus pertussis and a flu vaccine. Soon thereafter, he developed hives diffusely over his entire body. He took Benadryl with resolution of his symptoms. Since that time, he has continued to have hives every evening. They are diffuse over his entire body.  He reports that sometimes this occurs before a shower and sometimes it occurs after taking a shower. The lesions resolve with use of benadryl. He denies use of any new lotions, soaps, medications, supplements, detergents. 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 He denies any fever, chills, difficulty breathing, wheezing, weight loss, fatigue, change in appetite, change in bowel habits. He denies any lymphadenopathy. He notes that his mother has similar symptoms, but only with the use of certain medications.  No outpatient encounter prescriptions on file as of 06/29/2011.    Review of Systems  Constitutional: Negative for fever, chills, activity change, appetite change, fatigue and unexpected weight change.  Eyes: Negative for visual disturbance.  Respiratory: Negative for cough and shortness of breath.   Cardiovascular: Negative for chest pain, palpitations and leg swelling.  Gastrointestinal: Negative for abdominal pain and abdominal distention.  Genitourinary: Negative for dysuria, urgency and difficulty urinating.  Musculoskeletal: Negative for arthralgias and gait problem.  Skin: Positive for color change and rash.  Hematological: Negative for adenopathy.  Psychiatric/Behavioral: Negative for sleep disturbance and dysphoric mood. The patient is not nervous/anxious.    BP 128/68  Pulse 112  Temp(Src) 98.4 F  (36.9 C) (Oral)  Ht 6\' 2"  (1.88 m)  Wt 185 lb (83.915 kg)  BMI 23.75 kg/m2  SpO2 97%     Objective:   Physical Exam  Constitutional: He is oriented to person, place, and time. He appears well-developed and well-nourished. No distress.  HENT:  Head: Normocephalic and atraumatic.  Right Ear: External ear normal.  Left Ear: External ear normal.  Nose: Nose normal.  Mouth/Throat: Oropharynx is clear and moist.  Eyes: Conjunctivae and EOM are normal. Pupils are equal, round, and reactive to light. Right eye exhibits no discharge. Left eye exhibits no discharge. No scleral icterus.  Neck: Normal range of motion. Neck supple.  Pulmonary/Chest: Effort normal and breath sounds normal.  Musculoskeletal: Normal range of motion. He exhibits no edema.  Neurological: He is alert and oriented to person, place, and time. No cranial nerve deficit. Coordination normal.  Skin: Skin is warm and dry. No rash noted. He is not diaphoretic. No erythema. No pallor.  Psychiatric: He has a normal mood and affect. His behavior is normal. Judgment and thought content normal.          Assessment & Plan:

## 2011-06-29 NOTE — Patient Instructions (Signed)
Continue with Benadryl. Start Zantac 150mg  daily.     We will set you up with allergist.  Follow up 1 month.

## 2011-07-04 ENCOUNTER — Ambulatory Visit: Payer: BC Managed Care – PPO | Admitting: Internal Medicine

## 2011-07-27 ENCOUNTER — Ambulatory Visit: Payer: BC Managed Care – PPO | Admitting: Internal Medicine

## 2011-07-27 DIAGNOSIS — Z0289 Encounter for other administrative examinations: Secondary | ICD-10-CM

## 2011-12-23 ENCOUNTER — Telehealth: Payer: Self-pay | Admitting: Internal Medicine

## 2011-12-23 NOTE — Telephone Encounter (Signed)
Caller: Paz/Patient; PCP: Ronna Polio; CB#: [phone];  Call regarding Rt Hand Swollen around base of Thumb with severe itching & burning, redness and bumps; Exposed To MRSA at work; Has Appt 8/5; Onset: 12/22/11.  Afebrile. Was working outside 7 days ago but it does not resemble poison ivy. May use otc antihistamine as directed for itching. Advised to see MD within 24 hrs for new signs and symptoms of local infection per Skin Lesions Guideline. No appt remain in office for 12/23/11.  Is 2 hrs away from home currently. Will go to UC and follow up with office visit  12/26/11.

## 2011-12-23 NOTE — Telephone Encounter (Signed)
Left message on cell phone voicemail for patient to return call. 

## 2011-12-23 NOTE — Telephone Encounter (Signed)
Needs to be seen at urgent care as soon as possible. 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

## 2011-12-26 ENCOUNTER — Ambulatory Visit: Payer: BC Managed Care – PPO | Admitting: Internal Medicine

## 2011-12-26 DIAGNOSIS — Z0289 Encounter for other administrative examinations: Secondary | ICD-10-CM

## 2011-12-26 NOTE — Telephone Encounter (Signed)
Patient had an appt with Dr. Dan Humphreys this morning at 8:15 and no showed that appt.

## 2012-05-17 ENCOUNTER — Encounter: Payer: Self-pay | Admitting: Adult Health

## 2012-05-17 ENCOUNTER — Ambulatory Visit (INDEPENDENT_AMBULATORY_CARE_PROVIDER_SITE_OTHER): Payer: BC Managed Care – PPO | Admitting: Adult Health

## 2012-05-17 VITALS — BP 120/70 | HR 84 | Temp 98.2°F | Ht 74.0 in | Wt 189.5 lb

## 2012-05-17 DIAGNOSIS — R82998 Other abnormal findings in urine: Secondary | ICD-10-CM

## 2012-05-17 DIAGNOSIS — R3989 Other symptoms and signs involving the genitourinary system: Secondary | ICD-10-CM | POA: Insufficient documentation

## 2012-05-17 DIAGNOSIS — R52 Pain, unspecified: Secondary | ICD-10-CM

## 2012-05-17 DIAGNOSIS — M545 Low back pain, unspecified: Secondary | ICD-10-CM | POA: Insufficient documentation

## 2012-05-17 LAB — POCT URINALYSIS DIPSTICK
Blood, UA: NEGATIVE
Ketones, UA: NEGATIVE
Protein, UA: NEGATIVE
Spec Grav, UA: >=1.03
Urobilinogen, UA: 0.2
pH, UA: 6

## 2012-05-17 MED ORDER — CYCLOBENZAPRINE HCL 5 MG PO TABS: 5.0000 mg | ORAL_TABLET | Freq: Three times a day (TID) | ORAL | Status: DC | PRN

## 2012-05-17 MED ORDER — HYDROCODONE-ACETAMINOPHEN 5-325 MG PO TABS: 1.0000 | ORAL_TABLET | Freq: Four times a day (QID) | ORAL | Status: DC | PRN

## 2012-05-17 NOTE — Progress Notes (Signed)
  Subjective:    Patient ID: Sergio Garcia, male    DOB: 06/12/1975, 36 y.o.   MRN: [id number]  HPI  Mr. Polansky is a very pleasant 36 y/o male who presents to clinic today with c/o of low back pain. He reports that he was at his grandparents home on Monday and was helping them get some wood for their furnace. He bent over and felt a sudden pain in his back. He has taken ibuprofen, aleve, acetaminophen and pyridium without any relief.  He further reports "I think it is my kidneys because I am peeing very dark".  Denies dysuria and reports that he has been pushing fluids. Denies any hx of kidney stones.    Review of Systems  Constitutional: Negative.   Respiratory: Negative.   Cardiovascular: Negative.   Gastrointestinal: Negative for nausea, vomiting, diarrhea, constipation and blood in stool.  Genitourinary: Negative for frequency, hematuria and flank pain.  Musculoskeletal: Positive for back pain.  Skin: Negative.   Neurological: Negative for weakness and numbness.  Psychiatric/Behavioral: Negative.      BP 120/70  Pulse 84  Temp 98.2 F (36.8 C) (Oral)  Ht 6\' 2"  (1.88 m)  Wt 189 lb 8 oz (85.957 kg)  BMI 24.33 kg/m2  SpO2 98%     Objective:   Physical Exam  Constitutional: He is oriented to person, place, and time. He appears well-developed and well-nourished.  Cardiovascular: Normal rate and regular rhythm.   Pulmonary/Chest: Effort normal and breath sounds normal.  Musculoskeletal: He exhibits tenderness.       Unable to bend at waist 2/2 pain. Small amount of swelling noted right lower back.  Neurological: He is alert and oriented to person, place, and time. He has normal reflexes.  Skin: Skin is warm and dry.  Psychiatric: He has a normal mood and affect. His behavior is normal. Judgment and thought content normal.       Assessment & Plan:

## 2012-05-17 NOTE — Assessment & Plan Note (Signed)
Patient was concerned that there was something wrong with his kidneys because he was urinating very dark. Patient had been taking pyridium. Explained that medication causes discoloration of kidney. UA performed and was negative for any UTI, blood.

## 2012-05-17 NOTE — Patient Instructions (Addendum)
  Use ice to the affected area for 20 minutes at a time. You can apply ice every 2-4 hours as needed to decrease swelling. You can also use heat to the area for relaxing the muscles. Only do this for 20 minutes as well.  Flexeril is a muscle relaxer. It can cause you to be drowsy. Do not drive when taking this medication.  Norco is for pain. This can also cause you to be drowsy. Use with caution.  Use a firm pillow under your knees when you lie on your back. Place the pillow between your knees when you lie on your side.  You should begin to feel relief within a few days. If your symptoms do not improve or get worse, please call and let us know.

## 2012-05-17 NOTE — Assessment & Plan Note (Signed)
Musculoskeletal strain associated with bending and lifting wood 3 days ago. Start flexeril and Norco. Apply ice to affected area for 20 minutes every 2-4 hours. May alternate with heat. Return to normal activity as tolerated. Use pillow under knees while lying on back to reduce pressure. Use pillow between the knees while lying on side. If no resolution of symptoms within 4-6 weeks, consider physical therapy.

## 2012-05-19 LAB — URINE CULTURE: Colony Count: 5000

## 2012-07-07 ENCOUNTER — Other Ambulatory Visit: Payer: Self-pay

## 2012-12-18 ENCOUNTER — Emergency Department (HOSPITAL_COMMUNITY): Payer: BC Managed Care – PPO

## 2012-12-18 ENCOUNTER — Ambulatory Visit (INDEPENDENT_AMBULATORY_CARE_PROVIDER_SITE_OTHER): Payer: BC Managed Care – PPO | Admitting: Internal Medicine

## 2012-12-18 ENCOUNTER — Encounter (HOSPITAL_COMMUNITY): Payer: Self-pay | Admitting: Emergency Medicine

## 2012-12-18 ENCOUNTER — Telehealth: Payer: Self-pay | Admitting: Family Medicine

## 2012-12-18 ENCOUNTER — Emergency Department (HOSPITAL_COMMUNITY)
Admission: EM | Admit: 2012-12-18 | Discharge: 2012-12-18 | Disposition: A | Discharge status: Home / Self Care | Payer: BC Managed Care – PPO | Attending: Emergency Medicine | Admitting: Emergency Medicine

## 2012-12-18 ENCOUNTER — Encounter: Payer: Self-pay | Admitting: Internal Medicine

## 2012-12-18 VITALS — BP 124/90 | HR 67 | Temp 98.6°F | Wt 190.0 lb

## 2012-12-18 DIAGNOSIS — Z8659 Personal history of other mental and behavioral disorders: Secondary | ICD-10-CM | POA: Insufficient documentation

## 2012-12-18 DIAGNOSIS — R079 Chest pain, unspecified: Secondary | ICD-10-CM | POA: Insufficient documentation

## 2012-12-18 DIAGNOSIS — Z87448 Personal history of other diseases of urinary system: Secondary | ICD-10-CM | POA: Insufficient documentation

## 2012-12-18 DIAGNOSIS — Z8719 Personal history of other diseases of the digestive system: Secondary | ICD-10-CM | POA: Insufficient documentation

## 2012-12-18 DIAGNOSIS — R0602 Shortness of breath: Secondary | ICD-10-CM | POA: Insufficient documentation

## 2012-12-18 DIAGNOSIS — Z9889 Other specified postprocedural states: Secondary | ICD-10-CM | POA: Insufficient documentation

## 2012-12-18 DIAGNOSIS — R42 Dizziness and giddiness: Secondary | ICD-10-CM | POA: Insufficient documentation

## 2012-12-18 DIAGNOSIS — Z8739 Personal history of other diseases of the musculoskeletal system and connective tissue: Secondary | ICD-10-CM | POA: Insufficient documentation

## 2012-12-18 DIAGNOSIS — K219 Gastro-esophageal reflux disease without esophagitis: Secondary | ICD-10-CM | POA: Insufficient documentation

## 2012-12-18 LAB — CBC
HCT: 45.1 % (ref 39.0–52.0)
MCHC: 33.5 g/dL (ref 30.0–36.0)
MCV: 88.3 fL (ref 78.0–100.0)
Platelets: 230 10*3/uL (ref 150–400)
RDW: 12.6 % (ref 11.5–15.5)

## 2012-12-18 LAB — BASIC METABOLIC PANEL
BUN: 16 mg/dL (ref 6–23)
Calcium: 9.5 mg/dL (ref 8.4–10.5)
Creatinine, Ser: 0.96 mg/dL (ref 0.50–1.35)
GFR calc Af Amer: >90 mL/min (ref >90)
GFR calc non Af Amer: >90 mL/min (ref >90)

## 2012-12-18 MED ORDER — GI COCKTAIL ~~LOC~~
30.0000 mL | Freq: Once | ORAL | Status: AC
  Administered 2012-12-18: 30 mL via ORAL
  Filled 2012-12-18: qty 30

## 2012-12-18 MED ORDER — FAMOTIDINE 20 MG PO TABS: 20.0000 mg | ORAL_TABLET | Freq: Once | ORAL | Status: DC

## 2012-12-18 MED ORDER — OMEPRAZOLE 20 MG PO CPDR: 20.0000 mg | DELAYED_RELEASE_CAPSULE | Freq: Every day | ORAL | Status: DC

## 2012-12-18 NOTE — Telephone Encounter (Signed)
Please make the change in the EMR re: PCP and I'll await the ER notes.

## 2012-12-18 NOTE — Progress Notes (Signed)
  Subjective:    Patient ID: Sergio Garcia, male    DOB: 03-05-1976, 37 y.o.   MRN: [id number]  HPI 37YO male presents for acute visit complaining of 5 day h/o intermittent left sided chest pain. Pain is described as pressure that radiates to back and shoulder. Pain began suddenly while in the shower on Friday. Pain was associated with dyspnea, diaphoresis and mild nausea. Pain subsided after about 5 minutes with rest.  Pain recurred on Saturday as pt was trying to work on a car. Pain was similar this time, lasting several minutes, improved with rest. Now having fairly constant 2/10 left sided chest pressure. No personal h/o CAD, however father of pt has CAD. Has not taken any medications for pain. Notes increased stress at home recently with interactions with spouse, and some chronic stress at work.   Outpatient Encounter Prescriptions as of 12/18/2012  Medication Sig Dispense Refill  . cyclobenzaprine (FLEXERIL) 5 MG tablet Take 1 tablet (5 mg total) by mouth 3 (three) times daily as needed for muscle spasms.  30 tablet  1  . HYDROcodone-acetaminophen (NORCO) 5-325 MG per tablet Take 1 tablet by mouth every 6 (six) hours as needed for pain.  30 tablet  0   No facility-administered encounter medications on file as of 12/18/2012.   BP 124/90  Pulse 67  Temp(Src) 98.6 F (37 C) (Oral)  Wt 190 lb (86.183 kg)  BMI 24.38 kg/m2  SpO2 98%  Review of Systems  Constitutional: Positive for diaphoresis and fatigue. Negative for fever, chills, activity change, appetite change and unexpected weight change.  Eyes: Negative for visual disturbance.  Respiratory: Positive for shortness of breath. Negative for cough.   Cardiovascular: Positive for chest pain. Negative for palpitations and leg swelling.  Gastrointestinal: Negative for abdominal pain and abdominal distention.  Genitourinary: Negative for dysuria, urgency and difficulty urinating.  Musculoskeletal: Negative for arthralgias and gait problem.   Skin: Negative for color change and rash.  Hematological: Negative for adenopathy.  Psychiatric/Behavioral: Negative for sleep disturbance and dysphoric mood. The patient is nervous/anxious.        Objective:   Physical Exam  Constitutional: He is oriented to person, place, and time. He appears well-developed and well-nourished. No distress.  HENT:  Head: Normocephalic and atraumatic.  Right Ear: External ear normal.  Left Ear: External ear normal.  Nose: Nose normal.  Mouth/Throat: Oropharynx is clear and moist. No oropharyngeal exudate.  Eyes: Conjunctivae and EOM are normal. Pupils are equal, round, and reactive to light. Right eye exhibits no discharge. Left eye exhibits no discharge. No scleral icterus.  Neck: Normal range of motion. Neck supple. No tracheal deviation present. No thyromegaly present.  Cardiovascular: Normal rate, regular rhythm and normal heart sounds.  Exam reveals no gallop and no friction rub.   No murmur heard. Pulmonary/Chest: Effort normal and breath sounds normal. No respiratory distress. He has no wheezes. He has no rales. He exhibits no tenderness.  Musculoskeletal: Normal range of motion. He exhibits no edema.  Lymphadenopathy:    He has no cervical adenopathy.  Neurological: He is alert and oriented to person, place, and time. No cranial nerve deficit. Coordination normal.  Skin: Skin is warm and dry. No rash noted. He is not diaphoretic. No erythema. No pallor.  Psychiatric: His behavior is normal. Judgment and thought content normal. His mood appears anxious.          Assessment & Plan:

## 2012-12-18 NOTE — Assessment & Plan Note (Signed)
Symptoms of left sided chest pain, radiating to back and shoulder, dyspnea, diaphoresis concerning for myocardial ischemia. EKG shows no acute findings. However, given symptoms, recommended transfer to ED for further evaluation to include cardiac markers, CXR, monitoring. Pt prefers to go by personal vehicle to [facility]. His mother will drive him.

## 2012-12-18 NOTE — ED Provider Notes (Signed)
CSN: [id number]     Arrival date & time 12/18/12  1240 History     First MD Initiated Contact with Patient 12/18/12 1352     Chief Complaint  Patient presents with  . Chest Pain   (Consider location/radiation/quality/duration/timing/severity/associated sxs/prior Treatment) HPI Comments: 37 y/o male with PMHx of depression, anxiety, GERD and Gilbert's syndrome presents to the ED complaining of shortness of breath with associated left sided chest pain over the past 4 days. SOB intermittent, chest discomfort constant. 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 He then developed a pressure feeling on the left side of his chest, non-radiating. Tried taking ibuprofen without relief. Feeling of shortness of breath has since been coming at random. No specific alleviating factors. Denies fever, chills, cough, nausea, vomiting, palpitations. No recent long car rides or plane rides, no recent surgery. Positive family history of early heart disease in both father and grandfather. No family history of blood clots. Admits to being under increased stress recently.  Patient is a 37 y.o. male presenting with chest pain. The history is provided by the patient.  Chest Pain Associated symptoms: shortness of breath   Associated symptoms: no back pain, no cough, no dizziness, no fever, no nausea, no palpitations and not vomiting     Past Medical History  Diagnosis Date  . Depression   . Anxiety   . GERD (gastroesophageal reflux disease)   . Insomnia   . Gastritis   . Gilbert's syndrome     hyperbilirubinemia  . Prostatitis 2008  . Degenerative disc disease    Past Surgical History  Procedure Laterality Date  . Esophagogastroduodenoscopy  09/2004    gastritis acute and duodenitis  . Back surgery      2 ruptured discs   Family History  Problem Relation Age of Onset  . Colon polyps Mother   . Heart  disease Father   . Cancer Father     prostate CA   History  Substance Use Topics  . Smoking status: Never Smoker   . Smokeless tobacco: Never Used  . Alcohol Use: No    Review of Systems  Constitutional: Negative for fever and chills.  Respiratory: Positive for shortness of breath. Negative for cough.   Cardiovascular: Positive for chest pain. Negative for palpitations and leg swelling.  Gastrointestinal: Negative for nausea and vomiting.  Musculoskeletal: Negative for back pain.  Neurological: Positive for light-headedness. Negative for dizziness.  All other systems reviewed and are negative.    Allergies  Tdap  Home Medications   Current Outpatient Rx  Name  Route  Sig  Dispense  Refill  . ibuprofen (ADVIL,MOTRIN) 200 MG tablet   Oral   Take 400 mg by mouth 3 (three) times daily as needed for pain.          BP 128/87  Pulse 58  Temp(Src) 98.1 F (36.7 C) (Oral)  Resp 16  Ht 6\' 2"  (1.88 m)  Wt 190 lb (86.183 kg)  BMI 24.38 kg/m2  SpO2 99% Physical Exam  Nursing note and vitals reviewed. Constitutional: He is oriented to person, place, and time. He appears well-developed and well-nourished. No distress.  HENT:  Head: Normocephalic and atraumatic.  Mouth/Throat: Oropharynx is clear and moist.  Eyes: Conjunctivae and EOM are normal. Pupils are equal, round, and reactive to light.  Neck: Normal range of motion. Neck supple.  Cardiovascular: Normal rate, regular  rhythm, normal heart sounds and normal pulses.   No extremity edema.  Pulmonary/Chest: Effort normal and breath sounds normal. No respiratory distress. He has no decreased breath sounds. He has no wheezes. He has no rhonchi. He has no rales. He exhibits no tenderness.  Abdominal: Soft. Bowel sounds are normal. There is no tenderness.  Musculoskeletal: Normal range of motion. He exhibits no edema.  No calf tenderness.  Neurological: He is alert and oriented to person, place, and time. He has normal  strength. No sensory deficit.  Skin: Skin is warm and dry. He is not diaphoretic.  Psychiatric: He has a normal mood and affect. His behavior is normal.    ED Course   Procedures (including critical care time)  Labs Reviewed  BASIC METABOLIC PANEL - Abnormal; Notable for the following:    Glucose, Bld 117 (*)    All other components within normal limits  CBC  D-DIMER, QUANTITATIVE  POCT I-STAT TROPONIN I    Date: 12/18/2012  Rate: 71  Rhythm: normal sinus rhythm  QRS Axis: normal  Intervals: normal  ST/T Wave abnormalities: normal  Conduction Disutrbances:none  Narrative Interpretation: normal EKG  Old EKG Reviewed: none available   Dg Chest 2 View  12/18/2012   *RADIOLOGY REPORT*  Clinical Data: Chest pain and shortness of breath  CHEST - 2 VIEW  Comparison: Oct 07, 2004  Findings:  Lungs clear.  Heart size and pulmonary vascularity are normal.  No adenopathy.  No pneumothorax.  No bone lesions.  IMPRESSION: No abnormality noted.   Original Report Authenticated By: Bretta Bang, M.D.   1. Chest pain   2. Acid reflux     MDM  Patient with chest pain, sob. Cardiac workup completed in triage, unremarkable. D dimer obtained as symptoms seemed pleuritic, negative d-dimer. Hx of GERD. Gave GI cocktail and patient reports improvement of symptoms. Admits to being under increased stress. Physical exam unremarkable. Symptoms most likely related to GERD. He does not take anything on a daily basis for GERD. Rx omeprazole. Return precautions discussed. He will f/u with his PCP. Patient states understanding of plan and is agreeable.         Trevor Mace, PA-C 12/18/12 1539

## 2012-12-18 NOTE — Telephone Encounter (Signed)
Fine with me. FYI - He came in today with left sided chest pain, diaphoresis, dyspnea. I sent him to [facility] ED for evaluation.

## 2012-12-18 NOTE — ED Provider Notes (Signed)
Medical screening examination/treatment/procedure(s) were performed by non-physician practitioner and as supervising physician I was immediately available for consultation/collaboration.  Doug Sou, MD 12/18/12 1549

## 2012-12-18 NOTE — ED Notes (Signed)
Pt c/o left sided CP x 4 days with SOB

## 2012-12-18 NOTE — Telephone Encounter (Signed)
Patient switched to Dr.Walker in [place] from Dr.Tower.  Patient said his grandparents, Gabriel Rung and Rosalio Catterton, are patients of yours.  He wants to know if he can switch from Dr.Walker, to you.  Please advise.

## 2012-12-18 NOTE — ED Notes (Signed)
PT presents with Left sided CP starting this past Friday. Intermittent. Seen at PCP this am, PCP referred PT to [facility]. Hx of SOB, does not endorse at this time. Family Hx of MI

## 2012-12-18 NOTE — Telephone Encounter (Signed)
Okay with me if okay with current PCP.

## 2012-12-19 ENCOUNTER — Ambulatory Visit: Payer: BC Managed Care – PPO | Admitting: Internal Medicine

## 2012-12-20 ENCOUNTER — Ambulatory Visit (INDEPENDENT_AMBULATORY_CARE_PROVIDER_SITE_OTHER): Payer: BC Managed Care – PPO | Admitting: Internal Medicine

## 2012-12-20 ENCOUNTER — Encounter: Payer: Self-pay | Admitting: Internal Medicine

## 2012-12-20 VITALS — BP 112/72 | HR 75 | Temp 98.7°F | Wt 184.0 lb

## 2012-12-20 DIAGNOSIS — K589 Irritable bowel syndrome without diarrhea: Secondary | ICD-10-CM

## 2012-12-20 DIAGNOSIS — K219 Gastro-esophageal reflux disease without esophagitis: Secondary | ICD-10-CM

## 2012-12-20 DIAGNOSIS — F341 Dysthymic disorder: Secondary | ICD-10-CM

## 2012-12-20 DIAGNOSIS — R300 Dysuria: Secondary | ICD-10-CM

## 2012-12-20 LAB — POCT URINALYSIS DIPSTICK
Glucose, UA: NEGATIVE
Leukocytes, UA: NEGATIVE
Spec Grav, UA: >=1.03

## 2012-12-20 MED ORDER — DEXLANSOPRAZOLE 60 MG PO CPDR: 60.0000 mg | DELAYED_RELEASE_CAPSULE | Freq: Every day | ORAL | Status: DC

## 2012-12-20 NOTE — Progress Notes (Signed)
Subjective:    Patient ID: Sergio Garcia, male    DOB: 1975/09/07, 37 y.o.   MRN: [id number]  HPI 37 year old male with history of anxiety and GERD presents for followup after recent ER evaluation for chest pain and shortness of breath. He was evaluated in clinic for left-sided chest pain radiating to his back and shortness of breath. He was sent to the ER where he underwent chest x-ray which was normal. Cardiac markers were normal. He was given GI cocktail with improvement in his symptoms. He was discharged home on omeprazole 20 mg daily. He reports that symptoms are improved but he still has some epigastric discomfort and mild shortness of breath. He has been following a relatively bland diet. He has been able to tolerate solid foods.  He also notes significant increase in anxiety recently with ongoing issues with his wife. He prefers not to take any medication for this. He reports strong support from his coworkers and family.  Outpatient Prescriptions Prior to Visit  Medication Sig Dispense Refill  . ibuprofen (ADVIL,MOTRIN) 200 MG tablet Take 400 mg by mouth 3 (three) times daily as needed for pain.      Marland Kitchen omeprazole (PRILOSEC) 20 MG capsule Take 1 capsule (20 mg total) by mouth daily.  30 capsule  0   No facility-administered medications prior to visit.   BP 112/72  Pulse 75  Temp(Src) 98.7 F (37.1 C) (Oral)  Wt 184 lb (83.462 kg)  BMI 23.61 kg/m2  SpO2 97%  Review of Systems  Constitutional: Negative for fever, chills, activity change, appetite change, fatigue and unexpected weight change.  Eyes: Negative for visual disturbance.  Respiratory: Negative for cough and shortness of breath.   Cardiovascular: Negative for chest pain, palpitations and leg swelling.  Gastrointestinal: Positive for abdominal pain (epigastric). Negative for abdominal distention.  Genitourinary: Negative for dysuria, urgency and difficulty urinating.  Musculoskeletal: Negative for arthralgias and gait  problem.  Skin: Negative for color change and rash.  Hematological: Negative for adenopathy.  Psychiatric/Behavioral: Negative for sleep disturbance and dysphoric mood. The patient is not nervous/anxious.        Objective:   Physical Exam  Constitutional: He is oriented to person, place, and time. He appears well-developed and well-nourished. No distress.  HENT:  Head: Normocephalic and atraumatic.  Right Ear: External ear normal.  Left Ear: External ear normal.  Nose: Nose normal.  Mouth/Throat: Oropharynx is clear and moist. No oropharyngeal exudate.  Eyes: Conjunctivae and EOM are normal. Pupils are equal, round, and reactive to light. Right eye exhibits no discharge. Left eye exhibits no discharge. No scleral icterus.  Neck: Normal range of motion. Neck supple. No tracheal deviation present. No thyromegaly present.  Cardiovascular: Normal rate, regular rhythm and normal heart sounds.  Exam reveals no gallop and no friction rub.   No murmur heard. Pulmonary/Chest: Effort normal and breath sounds normal. No respiratory distress. He has no wheezes. He has no rales. He exhibits no tenderness.  Abdominal: Soft. Bowel sounds are normal. He exhibits no distension. There is tenderness (epigastric).  Musculoskeletal: Normal range of motion. He exhibits no edema.  Lymphadenopathy:    He has no cervical adenopathy.  Neurological: He is alert and oriented to person, place, and time. No cranial nerve deficit. Coordination normal.  Skin: Skin is warm and dry. No rash noted. He is not diaphoretic. No erythema. No pallor.  Psychiatric: He has a normal mood and affect. His behavior is normal. Judgment and thought content normal.  Assessment & Plan:

## 2012-12-20 NOTE — Assessment & Plan Note (Signed)
Chronic symptoms of irritable bowel syndrome including diarrhea after eating. Will set up follow up with GI. Discussed using Immodium and probiotics.

## 2012-12-20 NOTE — Assessment & Plan Note (Signed)
Chronic symptoms of anxiety made worse recently by marital issues. Offered support today. Discussed potential counseling. He reports that he can obtain counseling through his work. Discussed potentially starting medication to help with anxiety but he would like to hold off for now. Will follow.

## 2012-12-20 NOTE — Assessment & Plan Note (Signed)
Recent symptoms of chest pain secondary to acid reflux. Some improvement with omeprazole. Will try changing to Dexilant for better control. Will send breath test for H. pylori. Will set up GI followup for possible EGD for further evaluation.

## 2012-12-21 ENCOUNTER — Telehealth: Payer: Self-pay | Admitting: *Deleted

## 2012-12-21 NOTE — Telephone Encounter (Signed)
Called [employer] [place] for prior authorization in the [place], received PA request form, placed in Dr.walkers folder

## 2012-12-21 NOTE — Telephone Encounter (Signed)
Patient left message stating the pharmacist told him he needs prior autho on the Dexilant medication. Could you please get this started. Uses [facility]

## 2012-12-21 NOTE — Telephone Encounter (Signed)
Fine to try Pantoprazole 40mg  po daily #30 with 3 refills

## 2012-12-21 NOTE — Telephone Encounter (Signed)
Patient is at the pharmacy right now, pharmacist called and stated it very hard for them to get this approved since he has not been on any other medication for GERD. They would like him to try omeprazole or pantoprozole.

## 2012-12-22 ENCOUNTER — Other Ambulatory Visit: Payer: Self-pay | Admitting: *Deleted

## 2012-12-22 MED ORDER — PANTOPRAZOLE SODIUM 40 MG PO TBEC: 40.0000 mg | DELAYED_RELEASE_TABLET | Freq: Every day | ORAL | Status: DC

## 2012-12-22 NOTE — Telephone Encounter (Signed)
Sent new Rx to pharmacy 

## 2012-12-24 ENCOUNTER — Telehealth: Payer: Self-pay | Admitting: Internal Medicine

## 2012-12-24 ENCOUNTER — Telehealth: Payer: Self-pay | Admitting: Family Medicine

## 2012-12-24 ENCOUNTER — Encounter: Payer: Self-pay | Admitting: Gastroenterology

## 2012-12-24 DIAGNOSIS — R0600 Dyspnea, unspecified: Secondary | ICD-10-CM

## 2012-12-24 NOTE — Telephone Encounter (Signed)
Informed patient of instructions, he verbalized understanding. Would like to see Dr. Henrene Pastor.

## 2012-12-24 NOTE — Telephone Encounter (Signed)
FYI:  Pt mother calling, states Sergio Garcia is at work and is not having any chest pain at all but is having shortness of breath.  Mother asking about being seen by Dr. Kendrick Fries, says this was discussed at [facility] last visit.  She is calling Pulmonary to see if she can schedule that appointment for him.  Did not ask to schedule an appt here in the clinic at this time.  States Sergio Garcia also refuses to go to ER.  [name] (mom)  to give [place] a call back if he is still having trouble with his breathing.

## 2012-12-24 NOTE — Telephone Encounter (Signed)
Fwd to Dr. Walker 

## 2012-12-24 NOTE — Telephone Encounter (Signed)
We can try to get him in here with Dr. Kendrick Fries, however pulmonary exam was normal last visit. If having worsening shortness of breath right now, then I agree, he should be seen in the ED or urgent care for evaluation.

## 2012-12-24 NOTE — Telephone Encounter (Signed)
H. Pylrori breath test was negative.

## 2012-12-25 ENCOUNTER — Encounter: Payer: Self-pay | Admitting: Pulmonary Disease

## 2012-12-25 ENCOUNTER — Ambulatory Visit (INDEPENDENT_AMBULATORY_CARE_PROVIDER_SITE_OTHER): Payer: BC Managed Care – PPO | Admitting: Pulmonary Disease

## 2012-12-25 VITALS — BP 118/74 | HR 62 | Temp 98.4°F | Ht 74.0 in | Wt 189.0 lb

## 2012-12-25 DIAGNOSIS — R0609 Other forms of dyspnea: Secondary | ICD-10-CM

## 2012-12-25 DIAGNOSIS — R0989 Other specified symptoms and signs involving the circulatory and respiratory systems: Secondary | ICD-10-CM

## 2012-12-25 DIAGNOSIS — R0600 Dyspnea, unspecified: Secondary | ICD-10-CM

## 2012-12-25 DIAGNOSIS — R0602 Shortness of breath: Secondary | ICD-10-CM | POA: Insufficient documentation

## 2012-12-25 NOTE — Progress Notes (Signed)
Subjective:    Patient ID: Sergio Garcia, male    DOB: 1976/05/23, 37 y.o.   MRN: [id number]  HPI   This is a very pleasant 37 year old male who is referred to our clinic today for evaluation of shortness of breath and chest tightness. He states he has no prior medical history significant for lung disease. Approximately one week ago he had the sudden onset of chest pain and shortness of breath. Went to the emergency department where he underwent a chest x-ray, EKG and blood work which were all negative. He was discharged home. Ever since then he has had no chest pain and has taken some antacid medicine for it. (He was told that the pain was likely acid reflux). However, in the last week he has had shortness of breath with minimal activity. He says it just walking across the parking lot and very difficult and he often gets out of breath. One week ago he had no problems prior to these symptoms. He has not had cough or fever. He says that when he is at rest he feels fine and he is able to exercise somewhat when the temperature is cooler in the mornings. However if he just walks across the parking lot to get short of breath later in the afternoon. 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 It is mostly just exercise such as lifting heavy objects, carrying things while walking that make him short of breath. He has not had leg swelling, sinus symptoms, or ongoing acid reflux. Nothing has changing his living or work environment lately.    Past Medical History  Diagnosis Date  . Depression   . Anxiety   . GERD (gastroesophageal reflux disease)   . Insomnia   . Gastritis   . Gilbert's syndrome     hyperbilirubinemia  . Prostatitis 2008  . Degenerative disc disease      Family History  Problem Relation Age of Onset  . Colon polyps Mother   . Heart disease Father   . Pancreatic cancer Maternal  Grandfather   . Heart attack Father     x 2     History   Social History  . Marital Status: Married    Spouse Name: N/A    Number of Children: 2  . Years of Education: N/A   Occupational History  . Insurance account manager [employer]   Social History Main Topics  . Smoking status: Never Smoker   . Smokeless tobacco: Never Used  . Alcohol Use: No  . Drug Use: No  . Sexually Active: Not on file   Other Topics Concern  . Not on file   Social History Narrative   Lives in [place] with wife 2 children. Dog.  Works at [employer].      Regular Exercise -  Walk daily   Daily Caffeine Use:  Mt Dew - 4 to 5 20 oz bottles daily              Allergies  Allergen Reactions  . Tdap (Diphth-Acell Pertussis-Tetanus) Hives and Rash     Outpatient Prescriptions Prior to Visit  Medication Sig Dispense Refill  . pantoprazole (PROTONIX) 40 MG tablet Take 1 tablet (40 mg total) by mouth daily.  30 tablet  3  . ranitidine (ZANTAC) 150 MG capsule Take 150 mg by mouth daily.       No facility-administered  medications prior to visit.      Review of Systems  Constitutional: Negative for fever, chills, activity change and appetite change.  HENT: Negative for hearing loss, ear pain, congestion, rhinorrhea, sneezing, neck pain, neck stiffness, postnasal drip and sinus pressure.   Eyes: Negative for redness, itching and visual disturbance.  Respiratory: Positive for chest tightness and shortness of breath. Negative for cough and wheezing.   Cardiovascular: Negative for chest pain, palpitations and leg swelling.  Gastrointestinal: Negative for nausea, vomiting, abdominal pain, diarrhea, constipation, blood in stool and abdominal distention.  Musculoskeletal: Negative for myalgias, joint swelling, arthralgias and gait problem.  Skin: Negative for rash.  Neurological: Negative for dizziness, light-headedness, numbness and headaches.  Hematological: Does not bruise/bleed easily.   Psychiatric/Behavioral: Negative for confusion and dysphoric mood.       Objective:   Physical Exam Filed Vitals:   12/25/12 1509  BP: 118/74  Pulse: 62  Temp: 98.4 F (36.9 C)  TempSrc: Oral  Height: 6\' 2"  (1.88 m)  Weight: 189 lb (85.73 kg)  SpO2: 98%    Gen: anxious, but otherwise well appearing, no acute distress HEENT: NCAT, PERRL, EOMi, OP clear, neck supple without masses PULM: CTA B CV: RRR, no mgr, no JVD AB: BS+, soft, nontender, no hsm Ext: warm, no edema, no clubbing, no cyanosis Derm: no rash or skin breakdown Neuro: A&Ox4, CN II-XII intact, strength 5/5 in all 4 extremities Psyche: anxious     Assessment & Plan:   Dyspnea Ahmad's lung exam, CXR, simple spirometry, and exertional oximetry are all completely normal.  It is puzzling that the symptoms came on suddenly without any prior lung disease and his work up so far is normal.  One would consider a pulmonary embolism, but he had a negative d-dimer which essentially rules out. Because I can't come up with any other explanation other than anxiety I think it is reasonable to go ahead and get a CT scan of his chest. I will also treat him empiric way for asthma, however it would be unusual for this to suddenly pop up at age 26 and had no wheezing on physical exam and no obstruction on spirometry.  If the CT scan and the lung function testing are negative, then we may need to explore the possibilities of underlying heart disease (again unlikely but he has a very strong family history) versus anxiety.  Plan: -CT scan of chest with contrast to look for pulmonary embolism -Full pulmonary function testing in [facility] -Trial of albuterol -Followup 2 weeks. -If no improvement on the next visit consider cardiology evaluation   Updated Medication List Outpatient Encounter Prescriptions as of 12/25/2012  Medication Sig Dispense Refill  . pantoprazole (PROTONIX) 40 MG tablet Take 1 tablet (40 mg total) by mouth daily.   30 tablet  3  . ranitidine (ZANTAC) 150 MG capsule Take 150 mg by mouth daily.       No facility-administered encounter medications on file as of 12/25/2012.

## 2012-12-25 NOTE — Assessment & Plan Note (Signed)
Sergio Garcia's lung exam, CXR, simple spirometry, and exertional oximetry are all completely normal.  It is puzzling that the symptoms came on suddenly without any prior lung disease and his work up so far is normal.  One would consider a pulmonary embolism, but he had a negative d-dimer which essentially rules out. Because I can't come up with any other explanation other than anxiety I think it is reasonable to go ahead and get a CT scan of his chest. I will also treat him empiric way for asthma, however it would be unusual for this to suddenly pop up at age 37 and had no wheezing on physical exam and no obstruction on spirometry.  If the CT scan and the lung function testing are negative, then we may need to explore the possibilities of underlying heart disease (again unlikely but he has a very strong family history) versus anxiety.  Plan: -CT scan of chest with contrast to look for pulmonary embolism -Full pulmonary function testing in [facility] -Trial of albuterol -Followup 2 weeks. -If no improvement on the next visit consider cardiology evaluation

## 2012-12-25 NOTE — Patient Instructions (Addendum)
We will set up a CT scan of your chest to look for a blood clot We will also arrange full pulmonary function testing in [place] as well Use the albuterol 2 puffs every four hours as needed for shortness of breath, you can also use it before activity  We will see you back in 2 weeks or sooner if needed

## 2012-12-26 ENCOUNTER — Ambulatory Visit (INDEPENDENT_AMBULATORY_CARE_PROVIDER_SITE_OTHER)
Admission: RE | Admit: 2012-12-26 | Discharge: 2012-12-26 | Disposition: A | Discharge status: Home / Self Care | Payer: BC Managed Care – PPO | Source: Ambulatory Visit | Attending: Pulmonary Disease | Admitting: Pulmonary Disease

## 2012-12-26 ENCOUNTER — Telehealth: Payer: Self-pay | Admitting: *Deleted

## 2012-12-26 ENCOUNTER — Telehealth: Payer: Self-pay | Admitting: Pulmonary Disease

## 2012-12-26 DIAGNOSIS — R0609 Other forms of dyspnea: Secondary | ICD-10-CM

## 2012-12-26 DIAGNOSIS — R0600 Dyspnea, unspecified: Secondary | ICD-10-CM

## 2012-12-26 DIAGNOSIS — R0989 Other specified symptoms and signs involving the circulatory and respiratory systems: Secondary | ICD-10-CM

## 2012-12-26 MED ORDER — IOHEXOL 350 MG/ML SOLN
80.0000 mL | Freq: Once | INTRAVENOUS | Status: AC | PRN
  Administered 2012-12-26: 80 mL via INTRAVENOUS

## 2012-12-26 NOTE — Telephone Encounter (Signed)
Spoke with Rose @ CT--  CT is neg for PE

## 2012-12-26 NOTE — Telephone Encounter (Signed)
I tried to call Sergio Garcia to let him know that his CT was normal.  I had to leave a message.  Will cc triage so they can attempt to call him with this information tomorrow.

## 2012-12-27 NOTE — Telephone Encounter (Signed)
I spoke with patient about results and he verbalized understanding and had no questions 

## 2012-12-31 ENCOUNTER — Encounter: Payer: Self-pay | Admitting: Internal Medicine

## 2013-01-07 ENCOUNTER — Ambulatory Visit: Payer: BC Managed Care – PPO | Admitting: Internal Medicine

## 2013-01-08 ENCOUNTER — Ambulatory Visit: Payer: BC Managed Care – PPO | Admitting: Pulmonary Disease

## 2013-01-11 ENCOUNTER — Encounter: Payer: Self-pay | Admitting: Pulmonary Disease

## 2013-01-23 ENCOUNTER — Ambulatory Visit (INDEPENDENT_AMBULATORY_CARE_PROVIDER_SITE_OTHER): Payer: BC Managed Care – PPO | Admitting: Gastroenterology

## 2013-01-23 ENCOUNTER — Ambulatory Visit: Payer: BC Managed Care – PPO | Admitting: Internal Medicine

## 2013-01-23 ENCOUNTER — Encounter: Payer: Self-pay | Admitting: Gastroenterology

## 2013-01-23 VITALS — BP 100/78 | HR 72 | Ht 74.0 in | Wt 187.0 lb

## 2013-01-23 DIAGNOSIS — R079 Chest pain, unspecified: Secondary | ICD-10-CM

## 2013-01-23 DIAGNOSIS — K589 Irritable bowel syndrome without diarrhea: Secondary | ICD-10-CM

## 2013-01-23 MED ORDER — GLYCOPYRROLATE 1 MG PO TABS: 1.0000 mg | ORAL_TABLET | Freq: Two times a day (BID) | ORAL | Status: DC

## 2013-01-23 MED ORDER — DEXLANSOPRAZOLE 60 MG PO CPDR: 60.0000 mg | DELAYED_RELEASE_CAPSULE | Freq: Every day | ORAL | Status: DC

## 2013-01-23 NOTE — Progress Notes (Signed)
History of Present Illness: This is a 37 year old male with chest pain, back pain and SOB since July. He was evaluated in the ED and symptoms temporarily improved with a GI cocktail but his symptoms persist. He was evaluated by Cardiology where DOE and SOB were major symptoms however the patient relates that these symptoms have resolved and the only remaining symptom is left upper chest pain/pressure. Recent Chest CT was negative.  He has irritable bowel syndrome which was previously controlled on glycopyrrolate however he has not used this medication for several years. He is attempting to control his symptoms with Imodium as needed.  He underwent upper endoscopy by Dr. Mechele Collin in [place] in 2006 that showed gastritis and duodenitis. H. pylori was negative. He also underwent colonoscopy by Dr. Mechele Collin in 2006 which was normal including negative random left colon and terminal ileal biopsies. Denies weight loss, abdominal pain, constipation, diarrhea, change in stool caliber, melena, hematochezia, nausea, vomiting, dysphagia.  Review of Systems: Pertinent positive and negative review of systems were noted in the above HPI section. All other review of systems were otherwise negative.  Current Medications, Allergies, Past Medical History, Past Surgical History, Family History and Social History were reviewed in [employer] record.  Physical Exam: General: Well developed , well nourished, no acute distress Head: Normocephalic and atraumatic Eyes:  sclerae anicteric, EOMI Ears: Normal auditory acuity Mouth: No deformity or lesions Neck: Supple, no masses or thyromegaly Lungs: Clear throughout to auscultation, no chest wall tenderness. Heart: Regular rate and rhythm; no murmurs, rubs or bruits Abdomen: Soft, non tender and non distended. No masses, hepatosplenomegaly or hernias noted. Normal Bowel sounds Musculoskeletal: Symmetrical with no gross deformities  Skin: No lesions on  visible extremities Pulses:  Normal pulses noted Extremities: No clubbing, cyanosis, edema or deformities noted Neurological: Alert oriented x 4, grossly nonfocal Cervical Nodes:  No significant cervical adenopathy Inguinal Nodes: No significant inguinal adenopathy Psychological:  Alert and cooperative. Anxious.  Assessment and Recommendations:  1. Chest pain. A component is probably related to GERD but he has persistent left upper chest pain/pressure that has not been relieved. Change PPI to Dexilant 60 mg daily. Rule out esophagitis. R/O anxiety. The risks, benefits, and alternatives to endoscopy with possible biopsy and possible dilation were discussed with the patient and they consent to proceed.   2. IBS-D. Resume glycopyrrolate 1 mg bid as needed. Continue Imodium bid as needed.

## 2013-01-23 NOTE — Patient Instructions (Addendum)
We have sent the following medications to your pharmacy for you to pick up at your convenience: Robinul.   Stop taking pantoprazole and start Dexilant samples one tablet by mouth once daily until Upper Endoscopy.  You can also continue Zantac for breakthrough symptoms.   You have been scheduled for an endoscopy with propofol. Please follow written instructions given to you at your visit today. If you use inhalers (even only as needed), please bring them with you on the day of your procedure. Your physician has requested that you go to www.startemmi.com and enter the access code given to you at your visit today. This web site gives a general overview about your procedure. However, you should still follow specific instructions given to you by our office regarding your preparation for the procedure.  Thank you for choosing me and [facility].  Sergio Garcia. Pleas Koch., MD., Clementeen Graham  cc: Ronna Polio, MD

## 2013-02-07 ENCOUNTER — Ambulatory Visit (AMBULATORY_SURGERY_CENTER): Payer: BC Managed Care – PPO | Admitting: Gastroenterology

## 2013-02-07 ENCOUNTER — Encounter: Payer: Self-pay | Admitting: Gastroenterology

## 2013-02-07 VITALS — BP 123/79 | HR 59 | Temp 97.3°F | Resp 22 | Ht 74.0 in | Wt 187.0 lb

## 2013-02-07 DIAGNOSIS — R079 Chest pain, unspecified: Secondary | ICD-10-CM

## 2013-02-07 DIAGNOSIS — K219 Gastro-esophageal reflux disease without esophagitis: Secondary | ICD-10-CM

## 2013-02-07 MED ORDER — SODIUM CHLORIDE 0.9 % IV SOLN: 500.0000 mL | INTRAVENOUS | Status: DC

## 2013-02-07 NOTE — Op Note (Signed)
[facility] [address] [place], [address]   ENDOSCOPY PROCEDURE REPORT  PATIENT: Sergio, Garcia  MR#: [id number] BIRTHDATE: Aug 25, 1975 , 37  yrs. old GENDER: Male ENDOSCOPIST: Meryl Dare, MD, [facility] REFERRED BY:  Ronna Polio, M.D. PROCEDURE DATE:  02/07/2013 PROCEDURE:  EGD, diagnostic ASA CLASS:     Class II INDICATIONS:  Chest pain.   History of esophageal reflux. MEDICATIONS: MAC sedation, administered by CRNA and propofol (Diprivan) 150mg  IV TOPICAL ANESTHETIC: none DESCRIPTION OF PROCEDURE: After the risks benefits and alternatives of the procedure were thoroughly explained, informed consent was obtained.  The LB [id number] endoscope was introduced through the mouth and advanced to the second portion of the duodenum  without limitations.  The instrument was slowly withdrawn as the mucosa was fully examined.  ESOPHAGUS: The mucosa of the esophagus appeared normal. STOMACH: The mucosa and folds of the stomach appeared normal. DUODENUM: The duodenal mucosa showed no abnormalities in the bulb and second portion of the duodenum.  Retroflexed views revealed no abnormalities.  The scope was then withdrawn from the patient and the procedure completed.  COMPLICATIONS: There were no complications.  ENDOSCOPIC IMPRESSION: 1.   The EGD appeared normal  RECOMMENDATIONS: 1.  Anti-reflux regimen long term 2.  Continue PPI long term for GERD 3.  Continue glycopyrrolate bid as needed for IBS-D 4.  Office appt in 1 year and as needed   eSigned:  Meryl Dare, MD, [facility] 02/07/2013 9:21 AM

## 2013-02-07 NOTE — Progress Notes (Signed)
A/ox3 pleased with MAC, report to Jane RN 

## 2013-02-07 NOTE — Progress Notes (Signed)
Patient did not experience any of the following events: a burn prior to discharge; a fall within the facility; wrong site/side/patient/procedure/implant event; or a hospital transfer or hospital admission upon discharge from the facility. (G8907)Patient did not have preoperative order for IV antibiotic SSI prophylaxis. [phone])   Pt. Care partner noted slight swelling right side upper lip.  Nurse examined area.  Barely noticeable change from other side of lip. No broken skin or  Complaints from patient.

## 2013-02-07 NOTE — Patient Instructions (Addendum)

## 2013-02-08 ENCOUNTER — Telehealth: Payer: Self-pay | Admitting: *Deleted

## 2013-02-08 NOTE — Telephone Encounter (Signed)
Left message that we called for f/u 

## 2013-02-26 DIAGNOSIS — N411 Chronic prostatitis: Secondary | ICD-10-CM | POA: Insufficient documentation

## 2013-02-26 DIAGNOSIS — N50819 Testicular pain, unspecified: Secondary | ICD-10-CM | POA: Insufficient documentation

## 2013-02-26 DIAGNOSIS — Z3169 Encounter for other general counseling and advice on procreation: Secondary | ICD-10-CM | POA: Insufficient documentation

## 2013-03-04 ENCOUNTER — Ambulatory Visit (INDEPENDENT_AMBULATORY_CARE_PROVIDER_SITE_OTHER): Payer: BC Managed Care – PPO | Admitting: Family Medicine

## 2013-03-04 ENCOUNTER — Encounter: Payer: Self-pay | Admitting: Family Medicine

## 2013-03-04 VITALS — BP 134/90 | HR 82 | Temp 97.9°F | Ht 74.0 in | Wt 178.5 lb

## 2013-03-04 DIAGNOSIS — R109 Unspecified abdominal pain: Secondary | ICD-10-CM

## 2013-03-04 DIAGNOSIS — K589 Irritable bowel syndrome without diarrhea: Secondary | ICD-10-CM

## 2013-03-04 DIAGNOSIS — R1011 Right upper quadrant pain: Secondary | ICD-10-CM | POA: Insufficient documentation

## 2013-03-04 DIAGNOSIS — F341 Dysthymic disorder: Secondary | ICD-10-CM

## 2013-03-04 LAB — BASIC METABOLIC PANEL
BUN: 15 mg/dL (ref 6–23)
CO2: 28 mEq/L (ref 19–32)
Calcium: 9.3 mg/dL (ref 8.4–10.5)
GFR: 80.03 mL/min (ref >60.00)
Glucose, Bld: 109 mg/dL — ABNORMAL HIGH (ref 70–99)
Potassium: 4.2 mEq/L (ref 3.5–5.1)
Sodium: 138 mEq/L (ref 135–145)

## 2013-03-04 LAB — CBC WITH DIFFERENTIAL/PLATELET
Basophils Relative: 0.2 % (ref 0.0–3.0)
Eosinophils Relative: 2.1 % (ref 0.0–5.0)
HCT: 48.2 % (ref 39.0–52.0)
Lymphs Abs: 1.8 10*3/uL (ref 0.7–4.0)
MCV: 90.2 fl (ref 78.0–100.0)
Monocytes Absolute: 0.6 10*3/uL (ref 0.1–1.0)
Monocytes Relative: 8.4 % (ref 3.0–12.0)
RBC: 5.34 Mil/uL (ref 4.22–5.81)
WBC: 7.4 10*3/uL (ref 4.5–10.5)

## 2013-03-04 MED ORDER — BUPROPION HCL ER (XL) 150 MG PO TB24: 150.0000 mg | ORAL_TABLET | Freq: Every day | ORAL | Status: DC

## 2013-03-04 MED ORDER — PROMETHAZINE HCL 25 MG PO TABS: 25.0000 mg | ORAL_TABLET | Freq: Three times a day (TID) | ORAL | Status: DC | PRN

## 2013-03-04 NOTE — Assessment & Plan Note (Signed)
With diarrhea in pt with hx of IBS and stressors Cbc today  Addressed control of stress rxn and diarrhea

## 2013-03-04 NOTE — Assessment & Plan Note (Signed)
Diarrhea predominant - linked to stress Cbc today in light of prolonged course of symptoms  No help with anti spasmotic  Will try immodium otc short term and update  Disc need for fluids to prev dehydration (suggest pedialyte or rehydration solun) and then adv to [employer] as tol Update if not starting to improve in a week or if worsening

## 2013-03-04 NOTE — Patient Instructions (Signed)
Labs today  Start back on daily wellbutrin Follow up with Dr Dan Humphreys in about 2 weeks Try immodium for the IBS related diarrhea  Hydrate with water or rehydration solution like pedialyte  If worse - let us know  Phenergan with caution of sedation - for nausea as needed

## 2013-03-04 NOTE — Assessment & Plan Note (Signed)
Stressors have inc with recent separation-working toward divorce He declines counseling  .Reviewed stressors/ coping techniques/symptoms/ support sources/ tx options and side effects in detail today Will start wellbutrin xl 150 which has worked well for him in the past - and he will f/u with PCP  inst to call if worse and stop med if suicidal thoughts >25 min spent with face to face with patient, >50% counseling and/or coordinating care

## 2013-03-04 NOTE — Progress Notes (Signed)
Subjective:    Patient ID: Sergio Garcia, male    DOB: July 21, 1975, 37 y.o.   MRN: [id number]  HPI Here for acute illness -stomach problems and also stress reaction/ depression  Started last thurs/ Friday  Going through a lot of stress (separation) and this has caused him to eat and drink less  Is having diarrhea - this happens when he gets stressed  No fever and no signs of infection No blood in stool  He tried his glycopyrrolate from GI (for IBS)- and this made his cramps worse   Vomited yesterday times one/ occ nausea   Does not think he needs a counselor He needs his spouse to move out  He has to see his lawyer tom am He wants to start back on wellbutrin -has helped him in the past   Patient Active Problem List   Diagnosis Date Noted  . Dyspnea 12/25/2012  . GERD (gastroesophageal reflux disease) 12/20/2012  . 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 GILBERT'S SYNDROME 09/28/2009  . IRRITABLE BOWEL SYNDROME 09/28/2009  . ANXIETY DEPRESSION 09/01/2008  . 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 ALLERGIC RHINITIS 08/30/2007   Past Medical History  Diagnosis Date  . Depression   . Anxiety   . GERD (gastroesophageal reflux disease)   . Insomnia   . Gastritis   . Gilbert's syndrome     hyperbilirubinemia  . Prostatitis 2008  . Degenerative disc disease   . IBS (irritable bowel syndrome)    Past Surgical History  Procedure Laterality Date  . Esophagogastroduodenoscopy  09/2004    gastritis acute and duodenitis  . Back surgery      2 ruptured discs   History  Substance Use Topics  . Smoking status: Never Smoker   . Smokeless tobacco: Never Used  . Alcohol Use: No   Family History  Problem Relation Age of Onset  . Colon polyps Mother   . Heart disease Father   . Heart attack Father     x 2  . Pancreatic cancer Maternal Grandfather    Allergies  Allergen Reactions  . Tdap [Diphth-Acell Pertussis-Tetanus] Hives and Rash   Current  Outpatient Prescriptions on File Prior to Visit  Medication Sig Dispense Refill  . dexlansoprazole (DEXILANT) 60 MG capsule Take 1 capsule (60 mg total) by mouth daily.  20 capsule  0  . ranitidine (ZANTAC) 150 MG capsule Take 150 mg by mouth daily.       No current facility-administered medications on file prior to visit.    Review of Systems Review of Systems  Constitutional: Negative for fever, appetite change,  and unexpected weight change.  Eyes: Negative for pain and visual disturbance.  Respiratory: Negative for cough and shortness of breath.   Cardiovascular: Negative for cp or palpitations    Gastrointestinal: Negative for constipation/ blood in stool/ dark stool or heartburn Genitourinary: Negative for urgency and frequency.  Skin: Negative for pallor or rash   Neurological: Negative for weakness, light-headedness, numbness and headaches.  Hematological: Negative for adenopathy. Does not bruise/bleed easily.  Psychiatric/Behavioral: pos for dysphoric and anxious mood , neg for SI         Objective:   Physical Exam  Constitutional: He appears well-developed and well-nourished. No distress.  HENT:  Head: Normocephalic and atraumatic.  Mouth/Throat: Oropharynx is clear and moist.  Eyes: Conjunctivae and EOM are normal. Pupils are equal, round, and reactive to light. Right eye exhibits no discharge. Left eye exhibits no discharge. No scleral icterus.  Neck: Normal range of motion. Neck supple. No thyromegaly present.  Cardiovascular: Normal rate and regular rhythm.   Pulmonary/Chest: Effort normal and breath sounds normal. No respiratory distress. He has no wheezes. He has no rales.  Abdominal: Soft. Bowel sounds are normal. He exhibits no distension and no mass. There is tenderness. There is no rebound and no guarding.  Mild tenderness bilat LQ No rebound or guarding    Musculoskeletal: He exhibits no edema and no tenderness.  Lymphadenopathy:    He has no cervical  adenopathy.  Neurological: He is alert. He has normal reflexes.  Skin: Skin is warm and dry. No rash noted. No pallor.  Psychiatric: His speech is normal. Thought content normal. His mood appears anxious. His affect is blunt. His affect is not labile and not inappropriate. He is slowed and withdrawn. Thought content is not paranoid. Cognition and memory are normal. He exhibits a depressed mood. He expresses no homicidal and no suicidal ideation.  Pt seems anxious and timid  Discusses stressors candidly Declines counseling           Assessment & Plan:

## 2013-03-05 ENCOUNTER — Telehealth: Payer: Self-pay | Admitting: Internal Medicine

## 2013-03-05 NOTE — Telephone Encounter (Signed)
FYI

## 2013-03-05 NOTE — Telephone Encounter (Signed)
Patient Information:  Caller Name: Calyn  Phone: [phone]  Patient: Sergio Garcia, Sergio Garcia  Gender: Male  DOB: January 22, 1976  Age: 37 Years  PCP: Ronna Polio (Adults only)  Office Follow Up:  Does the office need to follow up with this patient?: No  Instructions For The Office: N/A  RN Note:  Headache f/u, Pt was seen at [facility] on 10-13.  Pt going thru divorce.  Wellbutrin not working for Pt as in the past.  No same day appts w/ [facility], appt scheduled back at [facility] w/ Dr Milinda Antis.  Advised Pt to monitor BP and call back if 20 points higher than baseline or be seen.  Symptoms  Reason For Call & Symptoms: Headache f/u, Pt was seen at [facility] on 10-13.  Pt going thru divorce.  Reviewed Health History In EMR: Yes  Reviewed Medications In EMR: Yes  Reviewed Allergies In EMR: Yes  Reviewed Surgeries / Procedures: Yes  Date of Onset of Symptoms: 03/03/2013  Treatments Tried: Phenergan, Welbutrin, Ibuprofen 600mg  taken 6am  Treatments Tried Worked: No  Guideline(s) Used:  Headache  Disposition Per Guideline:   Callback by PCP or Subspecialist within 1 Hour  Reason For Disposition Reached:   Severe headache and not relieved by pain meds  Advice Given:  N/A  RN Overrode Recommendation:  Make Appointment  Pt wants to f/u w/ MD  Appointment Scheduled:  03/06/2013 10:15:00 Appointment Scheduled Provider:  N/A

## 2013-03-06 ENCOUNTER — Ambulatory Visit: Payer: Self-pay | Admitting: Family Medicine

## 2013-03-06 DIAGNOSIS — Z0289 Encounter for other administrative examinations: Secondary | ICD-10-CM

## 2013-03-21 ENCOUNTER — Ambulatory Visit: Payer: BC Managed Care – PPO | Admitting: Internal Medicine

## 2013-03-22 ENCOUNTER — Ambulatory Visit (INDEPENDENT_AMBULATORY_CARE_PROVIDER_SITE_OTHER): Payer: BC Managed Care – PPO | Admitting: Internal Medicine

## 2013-03-22 ENCOUNTER — Encounter: Payer: Self-pay | Admitting: Internal Medicine

## 2013-03-22 VITALS — BP 118/96 | HR 80 | Temp 98.4°F | Wt 177.0 lb

## 2013-03-22 DIAGNOSIS — F341 Dysthymic disorder: Secondary | ICD-10-CM

## 2013-03-22 MED ORDER — BUPROPION HCL ER (XL) 300 MG PO TB24: 300.0000 mg | ORAL_TABLET | Freq: Every day | ORAL | Status: DC

## 2013-03-22 NOTE — Progress Notes (Signed)
Subjective:    Patient ID: Sergio Garcia, male    DOB: 04-12-1976, 37 y.o.   MRN: [id number]  HPI 37 year old male with history of GERD and anxiety presents for followup. He was recently seen by another provider and started on Wellbutrin because of significant anxiety related to ongoing separation from his wife. He reports some improvement in symptoms of anxiety on this medication. However, he continues to have difficulty sleeping through the night. He typically wakes at 2 AM and cannot go back to sleep. He has not yet started any counseling but reports this is available through his work. He notes some ongoing difficulties with his parents which has also contributed to anxiety.  Outpatient Prescriptions Prior to Visit  Medication Sig Dispense Refill  . dexlansoprazole (DEXILANT) 60 MG capsule Take 1 capsule (60 mg total) by mouth daily.  20 capsule  0  . glycopyrrolate (ROBINUL) 1 MG tablet Take 1 mg by mouth 2 (two) times daily as needed.      . ranitidine (ZANTAC) 150 MG capsule Take 150 mg by mouth daily.      Marland Kitchen buPROPion (WELLBUTRIN XL) 150 MG 24 hr tablet Take 1 tablet (150 mg total) by mouth daily.  30 tablet  1  . promethazine (PHENERGAN) 25 MG tablet Take 1 tablet (25 mg total) by mouth every 8 (eight) hours as needed for nausea.  20 tablet  0   No facility-administered medications prior to visit.   BP 118/96  Pulse 80  Temp(Src) 98.4 F (36.9 C) (Oral)  Wt 177 lb (80.287 kg)  BMI 22.72 kg/m2  SpO2 96%  Review of Systems  Constitutional: Negative for fever, chills, activity change, appetite change, fatigue and unexpected weight change.  Eyes: Negative for visual disturbance.  Respiratory: Negative for cough and shortness of breath.   Cardiovascular: Negative for chest pain, palpitations and leg swelling.  Gastrointestinal: Negative for abdominal pain and abdominal distention.  Genitourinary: Negative for dysuria, urgency and difficulty urinating.  Musculoskeletal: Negative for  arthralgias and gait problem.  Skin: Negative for color change and rash.  Hematological: Negative for adenopathy.  Psychiatric/Behavioral: Positive for sleep disturbance and dysphoric mood. The patient is nervous/anxious.        Objective:   Physical Exam  Constitutional: He is oriented to person, place, and time. He appears well-developed and well-nourished. No distress.  HENT:  Head: Normocephalic and atraumatic.  Right Ear: External ear normal.  Left Ear: External ear normal.  Nose: Nose normal.  Mouth/Throat: Oropharynx is clear and moist. No oropharyngeal exudate.  Eyes: Conjunctivae and EOM are normal. Pupils are equal, round, and reactive to light. Right eye exhibits no discharge. Left eye exhibits no discharge. No scleral icterus.  Neck: Normal range of motion. Neck supple. No tracheal deviation present. No thyromegaly present.  Cardiovascular: Normal rate, regular rhythm and normal heart sounds.  Exam reveals no gallop and no friction rub.   No murmur heard. Pulmonary/Chest: Effort normal and breath sounds normal. No respiratory distress. He has no wheezes. He has no rales. He exhibits no tenderness.  Musculoskeletal: Normal range of motion. He exhibits no edema.  Lymphadenopathy:    He has no cervical adenopathy.  Neurological: He is alert and oriented to person, place, and time. No cranial nerve deficit. Coordination normal.  Skin: Skin is warm and dry. No rash noted. He is not diaphoretic. No erythema. No pallor.  Psychiatric: His speech is normal and behavior is normal. Judgment and thought content normal. His mood appears anxious.  Cognition and memory are normal. He expresses no suicidal ideation.          Assessment & Plan:

## 2013-03-22 NOTE — Assessment & Plan Note (Signed)
Symptoms of anxiety and improved with use of Wellbutrin however not completely controlled. Will increase dose of Wellbutrin to 300 mg daily. Plan to followup in 2-4 weeks or sooner as needed. Encouraged patient to pursue counseling through his work which is free for him.

## 2013-04-02 ENCOUNTER — Telehealth: Payer: Self-pay | Admitting: Internal Medicine

## 2013-04-02 NOTE — Telephone Encounter (Signed)
We should see him back in a visit. Or, we can set him up with psychiatry.

## 2013-04-02 NOTE — Telephone Encounter (Deleted)
Patient Information: ° Caller Name: Barbara ° Phone: [phone] ° Patient: Astarita, Kayode L ° Gender: Male ° DOB: 11/29/1975 ° Age: 37 Years ° PCP: Walker, Jennifer (Adults only) ° °Office Follow Up: ° Does the office need to follow up with this patient?: Yes ° Instructions For The Office: Requesting medication change. Has call local agenecy now disposition due to sevre depression per wife/ Barbara. ° ° °Symptoms ° Reason For Call & Symptoms: Wife/Barbara states Jacori has been taking generic Wellbutrin/Buproprion since October 13,2014. States med was increased to 300mg November 7 ,2014.  Has not eaten x one week. Has not slept x one week. Has lost 40 lbs in last month since starting Wellbutrin. States he has filed for separation/Divorce. Wife/Barbara was involuntarily commited on 03/25/13 and was released  today 04/02/13. States Taiga has taken brand name Wellbutrin with positive effect in the past but brand name Wellbutrin is too expensive. States generic Wellbutrin does not work. States "he is in a daze and sits there and stares." Wife requesting medication be changed.  Wife stated  he has a concealed carry permit "which worries her.". Has not voiced suicidal or homocidal thoughts or threats. Has call local agenecy now disposition due to severe depression. Asking if a new medication can be started today 04/02/2013. States Victormanuel has an appt in office on 04/05/13. Please call Barbara at [phone] or Barbara's cell  [phone]. ° Reviewed Health History In EMR: Yes ° Reviewed Medications In EMR: Yes ° Reviewed Allergies In EMR: Yes ° Reviewed Surgeries / Procedures: Yes ° Date of Onset of Symptoms: 01/21/2013 ° Treatments Tried: generic Wellbutrin ° Treatments Tried Worked: No ° °Guideline(s) Used: ° Depression ° °Disposition Per Guideline:  ° Call Local Agency Now ° °Reason For Disposition Reached:  ° Patient sounds very upset or severely depressed (e.g., multiple symptoms of depression) ° °Advice Given: °  N/A ° °Patient Will Follow Care Advice: ° YES ° ° °

## 2013-04-02 NOTE — Telephone Encounter (Signed)
Please read below, not sure if you were aware he is going through a separation.

## 2013-04-02 NOTE — Telephone Encounter (Signed)
Spoke with patient and he would prefer to come back in to discuss this with Dr. Dan Humphreys. Appointment scheduled for Friday 11/14

## 2013-04-02 NOTE — Telephone Encounter (Signed)
Patient Information:  Caller Name: Britta Mccreedy  Phone: [phone]  Patient: Sergio Garcia, Sergio Garcia  Gender: Male  DOB: 09-Mar-1976  Age: 37 Years  PCP: Ronna Polio (Adults only)  Office Follow Up:  Does the office need to follow up with this patient?: Yes  Instructions For The Office: Requesting medication change. Has call local agenecy now disposition due to sevre depression per wife/ Britta Mccreedy.   Symptoms  Reason For Call & Symptoms: Wife/Barbara states Damarious has been taking generic Wellbutrin/Buproprion since October 13,2014. States med was increased to 300mg  November 7 ,2014.  Has not eaten x one week. Has not slept x one week. Has lost 40 lbs in last month since starting Wellbutrin. States he has filed for separation/Divorce. Wife/Barbara was involuntarily commited on 03/25/13 and was released  today 04/02/13. States Clois has taken brand name Wellbutrin with positive effect in the past but brand name Wellbutrin is too expensive. States generic Wellbutrin does not work. States "he is in a daze and sits there and stares." Wife requesting medication be changed.  Wife stated  he has a concealed carry permit "which worries her.". Has not voiced suicidal or homocidal thoughts or threats. Has call local agenecy now disposition due to severe depression. Asking if a new medication can be started today 04/02/2013. States Christofer has an appt in office on 04/05/13. Please call Britta Mccreedy at [phone] or Barbara's cell  [phone].  Reviewed Health History In EMR: Yes  Reviewed Medications In EMR: Yes  Reviewed Allergies In EMR: Yes  Reviewed Surgeries / Procedures: Yes  Date of Onset of Symptoms: 01/21/2013  Treatments Tried: generic Wellbutrin  Treatments Tried Worked: No  Guideline(s) Used:  Depression  Disposition Per Guideline:   Medical sales representative Now  Reason For Disposition Reached:   Patient sounds very upset or severely depressed (e.g., multiple symptoms of depression)  Advice Given:   N/A  Patient Will Follow Care Advice:  YES

## 2013-04-02 NOTE — Telephone Encounter (Signed)
Pt is currently on 300 mg of Wellbutrin and doesn't feel anything different since Dr. Dan Humphreys up the dosage. He was wondering there was anything else he could take that would work better.

## 2013-04-02 NOTE — Telephone Encounter (Signed)
duplicate

## 2013-04-05 ENCOUNTER — Encounter: Payer: Self-pay | Admitting: Internal Medicine

## 2013-04-05 ENCOUNTER — Ambulatory Visit (INDEPENDENT_AMBULATORY_CARE_PROVIDER_SITE_OTHER): Payer: BC Managed Care – PPO | Admitting: Internal Medicine

## 2013-04-05 VITALS — BP 108/70 | HR 88 | Temp 98.2°F | Resp 12 | Wt 172.0 lb

## 2013-04-05 DIAGNOSIS — F4323 Adjustment disorder with mixed anxiety and depressed mood: Secondary | ICD-10-CM | POA: Insufficient documentation

## 2013-04-05 MED ORDER — CLONAZEPAM 0.5 MG PO TABS: 0.5000 mg | ORAL_TABLET | Freq: Two times a day (BID) | ORAL | Status: DC | PRN

## 2013-04-05 MED ORDER — ESCITALOPRAM OXALATE 10 MG PO TABS: 10.0000 mg | ORAL_TABLET | Freq: Every day | ORAL | Status: DC
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
## 2013-04-05 NOTE — Progress Notes (Signed)
Pre visit review using our clinic review tool, if applicable. No additional management support is needed unless otherwise documented below in the visit note. 

## 2013-04-05 NOTE — Assessment & Plan Note (Addendum)
Symptoms consistent with adjustment disorder with mixed anxiety and depressed mood. No improvement with Wellbutrin. He has stopped this medication. Will start Lexapro 10 mg daily. Will use clonazepam as needed for severe episodes of anxiety and insomnia. Encouraged him to continue to seek a safe shelter with his grandparents. He denies suicidal or homicidal ideation. Followup in 2-4 weeks or sooner as needed.  Over of which >50% spent in face-to-face contact with patient discussing plan of care

## 2013-04-08 ENCOUNTER — Ambulatory Visit: Payer: BC Managed Care – PPO | Admitting: Internal Medicine

## 2013-04-22 ENCOUNTER — Encounter: Payer: Self-pay | Admitting: *Deleted

## 2013-04-23 ENCOUNTER — Encounter: Payer: Self-pay | Admitting: Internal Medicine

## 2013-04-23 ENCOUNTER — Ambulatory Visit (INDEPENDENT_AMBULATORY_CARE_PROVIDER_SITE_OTHER): Payer: BC Managed Care – PPO | Admitting: Internal Medicine

## 2013-04-23 VITALS — BP 122/86 | HR 76 | Temp 97.9°F | Wt 173.0 lb

## 2013-04-23 DIAGNOSIS — F4323 Adjustment disorder with mixed anxiety and depressed mood: Secondary | ICD-10-CM

## 2013-04-23 MED ORDER — ESCITALOPRAM OXALATE 20 MG PO TABS: 20.0000 mg | ORAL_TABLET | Freq: Every day | ORAL | Status: DC

## 2013-04-23 NOTE — Progress Notes (Signed)
Pre-visit discussion using our clinic review tool. No additional management support is needed unless otherwise documented below in the visit note.  
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
## 2013-04-23 NOTE — Assessment & Plan Note (Signed)
Symptoms improved but not completely controlled on Lexapro current dose. Will increase Lexapro to 20mg  daily. Continue prn Clonazepam for severe anxiety or panic. Follow up 4 weeks and prn.

## 2013-05-21 ENCOUNTER — Ambulatory Visit: Payer: BC Managed Care – PPO | Admitting: Internal Medicine

## 2013-05-23 HISTORY — PX: EXPLORATORY LAPAROTOMY: SUR591

## 2013-05-24 ENCOUNTER — Inpatient Hospital Stay: Payer: Self-pay | Admitting: Surgery

## 2013-05-24 LAB — URINALYSIS, COMPLETE
Bilirubin,UR: NEGATIVE
Blood: NEGATIVE
Glucose,UR: NEGATIVE mg/dL (ref 0–75)
Ketone: NEGATIVE
Leukocyte Esterase: NEGATIVE
Nitrite: NEGATIVE
PH: 5 (ref 4.5–8.0)
Protein: 30
Specific Gravity: 1.03 (ref 1.003–1.030)
Squamous Epithelial: NONE SEEN
WBC UR: <1 /HPF (ref 0–5)

## 2013-05-24 LAB — CBC WITH DIFFERENTIAL/PLATELET
BASOS PCT: 0.3 %
Basophil #: 0 10*3/uL (ref 0.0–0.1)
Eosinophil #: 0 10*3/uL (ref 0.0–0.7)
Eosinophil %: 0.4 %
HCT: 50.9 % (ref 40.0–52.0)
HGB: 17 g/dL (ref 13.0–18.0)
LYMPHS ABS: 0.9 10*3/uL — AB (ref 1.0–3.6)
Lymphocyte %: 9.5 %
MCH: 30.2 pg (ref 26.0–34.0)
MCHC: 33.5 g/dL (ref 32.0–36.0)
MCV: 90 fL (ref 80–100)
Monocyte #: 0.7 x10 3/mm (ref 0.2–1.0)
Monocyte %: 7.4 %
NEUTROS ABS: 8.1 10*3/uL — AB (ref 1.4–6.5)
NEUTROS PCT: 82.4 %
Platelet: 262 10*3/uL (ref 150–440)
RBC: 5.64 10*6/uL (ref 4.40–5.90)
RDW: 13.8 % (ref 11.5–14.5)
WBC: 9.9 10*3/uL (ref 3.8–10.6)

## 2013-05-24 LAB — COMPREHENSIVE METABOLIC PANEL
ALK PHOS: 61 U/L
Albumin: 3.9 g/dL (ref 3.4–5.0)
Anion Gap: 4 — ABNORMAL LOW (ref 7–16)
BILIRUBIN TOTAL: 1.5 mg/dL — AB (ref 0.2–1.0)
BUN: 23 mg/dL — ABNORMAL HIGH (ref 7–18)
CHLORIDE: 103 mmol/L (ref 98–107)
CO2: 30 mmol/L (ref 21–32)
Calcium, Total: 9.5 mg/dL (ref 8.5–10.1)
Creatinine: 1.1 mg/dL (ref 0.60–1.30)
EGFR (Non-African Amer.): >60
GLUCOSE: 133 mg/dL — AB (ref 65–99)
Osmolality: 279 (ref 275–301)
Potassium: 4.6 mmol/L (ref 3.5–5.1)
SGOT(AST): 267 U/L — ABNORMAL HIGH (ref 15–37)
SGPT (ALT): 211 U/L — ABNORMAL HIGH (ref 12–78)
SODIUM: 137 mmol/L (ref 136–145)
TOTAL PROTEIN: 7.7 g/dL (ref 6.4–8.2)

## 2013-05-24 LAB — LIPASE, BLOOD: Lipase: 90 U/L (ref 73–393)

## 2013-05-24 LAB — CLOSTRIDIUM DIFFICILE(ARMC)

## 2013-05-25 LAB — HEPATIC FUNCTION PANEL A (ARMC)
ALK PHOS: 40 U/L — AB
ALT: 123 U/L — AB (ref 12–78)
AST: 92 U/L — AB (ref 15–37)
Albumin: 3 g/dL — ABNORMAL LOW (ref 3.4–5.0)
Bilirubin, Direct: 0.3 mg/dL — ABNORMAL HIGH (ref 0.00–0.20)
Bilirubin,Total: 1.2 mg/dL — ABNORMAL HIGH (ref 0.2–1.0)
TOTAL PROTEIN: 5.7 g/dL — AB (ref 6.4–8.2)

## 2013-05-25 LAB — CBC WITH DIFFERENTIAL/PLATELET
Basophil #: 0 10*3/uL (ref 0.0–0.1)
Basophil %: 0.4 %
Eosinophil #: 0.1 10*3/uL (ref 0.0–0.7)
Eosinophil %: 2.2 %
HCT: 41.9 % (ref 40.0–52.0)
HGB: 14.1 g/dL (ref 13.0–18.0)
Lymphocyte #: 1.7 10*3/uL (ref 1.0–3.6)
Lymphocyte %: 38.2 %
MCH: 30.4 pg (ref 26.0–34.0)
MCHC: 33.6 g/dL (ref 32.0–36.0)
MCV: 91 fL (ref 80–100)
MONOS PCT: 20.4 %
Monocyte #: 0.9 x10 3/mm (ref 0.2–1.0)
NEUTROS PCT: 38.8 %
Neutrophil #: 1.8 10*3/uL (ref 1.4–6.5)
Platelet: 210 10*3/uL (ref 150–440)
RBC: 4.64 10*6/uL (ref 4.40–5.90)
RDW: 13.4 % (ref 11.5–14.5)
WBC: 4.5 10*3/uL (ref 3.8–10.6)

## 2013-05-25 LAB — BASIC METABOLIC PANEL
Anion Gap: 2 — ABNORMAL LOW (ref 7–16)
BUN: 16 mg/dL (ref 7–18)
CALCIUM: 8.6 mg/dL (ref 8.5–10.1)
Chloride: 104 mmol/L (ref 98–107)
Co2: 29 mmol/L (ref 21–32)
Creatinine: 1.18 mg/dL (ref 0.60–1.30)
EGFR (African American): >60
EGFR (Non-African Amer.): >60
Glucose: 118 mg/dL — ABNORMAL HIGH (ref 65–99)
Osmolality: 272 (ref 275–301)
POTASSIUM: 4 mmol/L (ref 3.5–5.1)
Sodium: 135 mmol/L — ABNORMAL LOW (ref 136–145)

## 2013-05-27 LAB — CBC WITH DIFFERENTIAL/PLATELET
BASOS PCT: 0.6 %
Basophil #: 0 10*3/uL (ref 0.0–0.1)
Eosinophil #: 0.1 10*3/uL (ref 0.0–0.7)
Eosinophil %: 1.5 %
HCT: 43.4 % (ref 40.0–52.0)
HGB: 14.5 g/dL (ref 13.0–18.0)
LYMPHS ABS: 1.8 10*3/uL (ref 1.0–3.6)
LYMPHS PCT: 34.2 %
MCH: 29.7 pg (ref 26.0–34.0)
MCHC: 33.3 g/dL (ref 32.0–36.0)
MCV: 89 fL (ref 80–100)
MONOS PCT: 12.6 %
Monocyte #: 0.7 x10 3/mm (ref 0.2–1.0)
NEUTROS ABS: 2.7 10*3/uL (ref 1.4–6.5)
NEUTROS PCT: 51.1 %
PLATELETS: 235 10*3/uL (ref 150–440)
RBC: 4.87 10*6/uL (ref 4.40–5.90)
RDW: 13.5 % (ref 11.5–14.5)
WBC: 5.3 10*3/uL (ref 3.8–10.6)

## 2013-05-27 LAB — BASIC METABOLIC PANEL
ANION GAP: 0 — AB (ref 7–16)
BUN: 4 mg/dL — ABNORMAL LOW (ref 7–18)
CALCIUM: 8.9 mg/dL (ref 8.5–10.1)
CREATININE: 0.9 mg/dL (ref 0.60–1.30)
Chloride: 104 mmol/L (ref 98–107)
Co2: 35 mmol/L — ABNORMAL HIGH (ref 21–32)
EGFR (Non-African Amer.): >60
Glucose: 87 mg/dL (ref 65–99)
Osmolality: 274 (ref 275–301)
POTASSIUM: 4.1 mmol/L (ref 3.5–5.1)
Sodium: 139 mmol/L (ref 136–145)

## 2013-05-27 LAB — HEPATIC FUNCTION PANEL A (ARMC)
ALBUMIN: 3.1 g/dL — AB (ref 3.4–5.0)
Alkaline Phosphatase: 45 U/L
BILIRUBIN TOTAL: 1.1 mg/dL — AB (ref 0.2–1.0)
Bilirubin, Direct: 0.2 mg/dL (ref 0.00–0.20)
SGOT(AST): 43 U/L — ABNORMAL HIGH (ref 15–37)
SGPT (ALT): 96 U/L — ABNORMAL HIGH (ref 12–78)
Total Protein: 6.4 g/dL (ref 6.4–8.2)

## 2013-05-30 ENCOUNTER — Ambulatory Visit: Payer: BC Managed Care – PPO | Admitting: Internal Medicine

## 2013-05-30 LAB — BASIC METABOLIC PANEL
ANION GAP: 2 — AB (ref 7–16)
BUN: 9 mg/dL (ref 7–18)
CALCIUM: 8.9 mg/dL (ref 8.5–10.1)
Chloride: 103 mmol/L (ref 98–107)
Co2: 34 mmol/L — ABNORMAL HIGH (ref 21–32)
Creatinine: 0.95 mg/dL (ref 0.60–1.30)
EGFR (Non-African Amer.): >60
Glucose: 141 mg/dL — ABNORMAL HIGH (ref 65–99)
Osmolality: 279 (ref 275–301)
POTASSIUM: 4.2 mmol/L (ref 3.5–5.1)
Sodium: 139 mmol/L (ref 136–145)

## 2013-05-30 LAB — CBC WITH DIFFERENTIAL/PLATELET
Basophil #: 0 10*3/uL (ref 0.0–0.1)
Basophil %: 0.2 %
EOS ABS: 0 10*3/uL (ref 0.0–0.7)
Eosinophil %: 0 %
HCT: 44 % (ref 40.0–52.0)
HGB: 14.7 g/dL (ref 13.0–18.0)
Lymphocyte #: 0.9 10*3/uL — ABNORMAL LOW (ref 1.0–3.6)
Lymphocyte %: 7.5 %
MCH: 30.1 pg (ref 26.0–34.0)
MCHC: 33.5 g/dL (ref 32.0–36.0)
MCV: 90 fL (ref 80–100)
MONOS PCT: 3.6 %
Monocyte #: 0.4 x10 3/mm (ref 0.2–1.0)
NEUTROS ABS: 10.7 10*3/uL — AB (ref 1.4–6.5)
Neutrophil %: 88.7 %
Platelet: 281 10*3/uL (ref 150–440)
RBC: 4.89 10*6/uL (ref 4.40–5.90)
RDW: 13.5 % (ref 11.5–14.5)
WBC: 12.1 10*3/uL — ABNORMAL HIGH (ref 3.8–10.6)

## 2013-05-31 LAB — CBC WITH DIFFERENTIAL/PLATELET
BASOS PCT: 0.1 %
Basophil #: 0 10*3/uL (ref 0.0–0.1)
EOS PCT: 0 %
Eosinophil #: 0 10*3/uL (ref 0.0–0.7)
HCT: 44.8 % (ref 40.0–52.0)
HGB: 14.8 g/dL (ref 13.0–18.0)
LYMPHS ABS: 1.3 10*3/uL (ref 1.0–3.6)
Lymphocyte %: 10.3 %
MCH: 29.7 pg (ref 26.0–34.0)
MCHC: 33 g/dL (ref 32.0–36.0)
MCV: 90 fL (ref 80–100)
MONO ABS: 0.6 x10 3/mm (ref 0.2–1.0)
MONOS PCT: 4.5 %
NEUTROS ABS: 10.3 10*3/uL — AB (ref 1.4–6.5)
Neutrophil %: 85.1 %
Platelet: 274 10*3/uL (ref 150–440)
RBC: 4.98 10*6/uL (ref 4.40–5.90)
RDW: 14.1 % (ref 11.5–14.5)
WBC: 12.2 10*3/uL — ABNORMAL HIGH (ref 3.8–10.6)

## 2013-06-27 ENCOUNTER — Encounter: Payer: Self-pay | Admitting: Internal Medicine

## 2013-06-27 ENCOUNTER — Ambulatory Visit (INDEPENDENT_AMBULATORY_CARE_PROVIDER_SITE_OTHER): Payer: BC Managed Care – PPO | Admitting: Internal Medicine

## 2013-06-27 VITALS — BP 118/90 | HR 77 | Temp 98.1°F | Wt 184.0 lb

## 2013-06-27 DIAGNOSIS — F341 Dysthymic disorder: Secondary | ICD-10-CM

## 2013-06-27 DIAGNOSIS — K529 Noninfective gastroenteritis and colitis, unspecified: Secondary | ICD-10-CM

## 2013-06-27 DIAGNOSIS — K5289 Other specified noninfective gastroenteritis and colitis: Secondary | ICD-10-CM

## 2013-06-27 NOTE — Progress Notes (Signed)
Subjective:    Patient ID: Sergio Garcia, male    DOB: March 26, 1976, 38 y.o.   MRN: [id number]  HPI 38YO male presents for hospital follow up. Admitted Jan 2nd with nausea, vomiting, abdominal pain, described as "stabbing" in right lower abdomen. Pt girlfriend took him to hospital. Admitted and had CT abdomen and then ex-lap to evaluate cause of symptoms. Found possible enteritis with SBO. Question of Crohn's disease. Remained in hospital until Jan 12th.  Pt tried to follow up with GI physician at [facility] who performed testing during hospitalization, but he is unable to schedule visit at [facility] because of outstanding financial obligations. He would like to get reports and follow up with Dr. Fuller Plan, his usual GI physician. Since discharge, he has been tolerating full diet. No recurrent abdominal pain. No constipation, diarrhea, NV.   In regards to anxiety, symptoms have been well controlled with use of Lexapro at higher dose of 48m. He notes some increased "jitters" on this medication, but prefers not to adjust dose down because of poor control of anxiety on lower dose. He also prefers not to change medication because of poor control of anxiety on previous meds. He is only occasionally using Clonazepam. He continues to live with girlfriend. Now has custody of his daughter.  Review of Systems  Constitutional: Negative for fever, chills, activity change, appetite change, fatigue and unexpected weight change.  Eyes: Negative for visual disturbance.  Respiratory: Negative for cough and shortness of breath.   Cardiovascular: Negative for chest pain, palpitations and leg swelling.  Gastrointestinal: Negative for nausea, vomiting, abdominal pain, diarrhea, constipation, blood in stool, abdominal distention and anal bleeding.  Genitourinary: Negative for dysuria, urgency and difficulty urinating.  Musculoskeletal: Negative for arthralgias and gait problem.  Skin: Negative for color change and rash.    Hematological: Negative for adenopathy.  Psychiatric/Behavioral: Negative for sleep disturbance and dysphoric mood. The patient is nervous/anxious.        Objective:    BP 118/90  Pulse 77  Temp(Src) 98.1 F (36.7 C) (Oral)  Wt 184 lb (83.462 kg)  SpO2 97% Physical Exam  Constitutional: He is oriented to person, place, and time. He appears well-developed and well-nourished. No distress.  HENT:  Head: Normocephalic and atraumatic.  Right Ear: External ear normal.  Left Ear: External ear normal.  Nose: Nose normal.  Mouth/Throat: Oropharynx is clear and moist. No oropharyngeal exudate.  Eyes: Conjunctivae and EOM are normal. Pupils are equal, round, and reactive to light. Right eye exhibits no discharge. Left eye exhibits no discharge. No scleral icterus.  Neck: Normal range of motion. Neck supple. No tracheal deviation present. No thyromegaly present.  Cardiovascular: Normal rate, regular rhythm and normal heart sounds.  Exam reveals no gallop and no friction rub.   No murmur heard. Pulmonary/Chest: Effort normal and breath sounds normal. No accessory muscle usage. Not tachypneic. No respiratory distress. He has no decreased breath sounds. He has no wheezes. He has no rhonchi. He has no rales. He exhibits no tenderness.  Abdominal: Soft. Bowel sounds are normal. He exhibits no distension and no mass. There is no tenderness. There is no rebound and no guarding.  Musculoskeletal: Normal range of motion. He exhibits no edema.  Lymphadenopathy:    He has no cervical adenopathy.  Neurological: He is alert and oriented to person, place, and time. No cranial nerve deficit. Coordination normal.  Skin: Skin is warm and dry. No rash noted. He is not diaphoretic. No erythema. No pallor.  Psychiatric:  His behavior is normal. Judgment and thought content normal. His mood appears anxious.          Assessment & Plan:   Problem List Items Addressed This Visit   ANXIETY DEPRESSION      Symptoms well controlled with Lexapro and prn Clonazepam. Will continue.    Enteritis - Primary     S/p recent hospitalization for enteritis and partial SBO. Symptomatically doing well with no recurrence of constipation or abdominal pain. Tolerating full diet. Will request records from hospitalization including labs and testing for Crohn's. Will set up follow up with Dr. Fuller Plan in GI. Continue PPI. Samples of Dexilant given today, as pt cannot afford this medication.    Relevant Orders      Ambulatory referral to Gastroenterology      Comp Met (CMET)      CBC w/Diff       Return in about 4 weeks (around 07/25/2013).

## 2013-06-27 NOTE — Progress Notes (Signed)
Pre-visit discussion using our clinic review tool. No additional management support is needed unless otherwise documented below in the visit note.  

## 2013-06-27 NOTE — Assessment & Plan Note (Signed)
S/p recent hospitalization for enteritis and partial SBO. Symptomatically doing well with no recurrence of constipation or abdominal pain. Tolerating full diet. Will request records from hospitalization including labs and testing for Crohn's. Will set up follow up with Dr. Fuller Plan in GI. Continue PPI. Samples of Dexilant given today, as pt cannot afford this medication.

## 2013-06-27 NOTE — Assessment & Plan Note (Signed)
Symptoms well controlled with Lexapro and prn Clonazepam. Will continue.

## 2013-06-28 LAB — CBC WITH DIFFERENTIAL/PLATELET
Basophils Absolute: 0 10*3/uL (ref 0.0–0.1)
Basophils Relative: 0.6 % (ref 0.0–3.0)
Eosinophils Absolute: 0.2 10*3/uL (ref 0.0–0.7)
Eosinophils Relative: 2.7 % (ref 0.0–5.0)
HEMATOCRIT: 48.1 % (ref 39.0–52.0)
HEMOGLOBIN: 15.7 g/dL (ref 13.0–17.0)
LYMPHS ABS: 2.6 10*3/uL (ref 0.7–4.0)
Lymphocytes Relative: 35.4 % (ref 12.0–46.0)
MCHC: 32.6 g/dL (ref 30.0–36.0)
MCV: 92.9 fl (ref 78.0–100.0)
MONOS PCT: 8.7 % (ref 3.0–12.0)
Monocytes Absolute: 0.6 10*3/uL (ref 0.1–1.0)
NEUTROS ABS: 3.9 10*3/uL (ref 1.4–7.7)
Neutrophils Relative %: 52.6 % (ref 43.0–77.0)
Platelets: 235 10*3/uL (ref 150.0–400.0)
RBC: 5.18 Mil/uL (ref 4.22–5.81)
RDW: 14.9 % — ABNORMAL HIGH (ref 11.5–14.6)
WBC: 7.4 10*3/uL (ref 4.5–10.5)

## 2013-07-01 LAB — COMPREHENSIVE METABOLIC PANEL
ALK PHOS: 37 U/L — AB (ref 39–117)
ALT: 58 U/L — AB (ref 0–53)
AST: 35 U/L (ref 0–37)
Albumin: 4 g/dL (ref 3.5–5.2)
BILIRUBIN TOTAL: 0.8 mg/dL (ref 0.3–1.2)
BUN: 14 mg/dL (ref 6–23)
CO2: 31 meq/L (ref 19–32)
Calcium: 9.7 mg/dL (ref 8.4–10.5)
Chloride: 106 mEq/L (ref 96–112)
Creatinine, Ser: 1.1 mg/dL (ref 0.4–1.5)
GFR: 85.17 mL/min (ref >60.00)
Glucose, Bld: 90 mg/dL (ref 70–99)
Potassium: 4.5 mEq/L (ref 3.5–5.1)
Sodium: 140 mEq/L (ref 135–145)
Total Protein: 6.7 g/dL (ref 6.0–8.3)

## 2013-07-04 ENCOUNTER — Ambulatory Visit (INDEPENDENT_AMBULATORY_CARE_PROVIDER_SITE_OTHER): Payer: BC Managed Care – PPO | Admitting: Gastroenterology

## 2013-07-04 ENCOUNTER — Encounter: Payer: Self-pay | Admitting: Gastroenterology

## 2013-07-04 VITALS — BP 94/66 | HR 68 | Ht 73.0 in | Wt 188.4 lb

## 2013-07-04 DIAGNOSIS — K529 Noninfective gastroenteritis and colitis, unspecified: Secondary | ICD-10-CM

## 2013-07-04 DIAGNOSIS — K5289 Other specified noninfective gastroenteritis and colitis: Secondary | ICD-10-CM

## 2013-07-04 DIAGNOSIS — R933 Abnormal findings on diagnostic imaging of other parts of digestive tract: Secondary | ICD-10-CM

## 2013-07-04 NOTE — Progress Notes (Addendum)
    History of Present Illness: This is a 38 year old male who was hospitalized at [facility] in January for abdominal pain. I reviewed records from that hospitalization. Imaging studies consistent with small bowel obstruction or ileus. Small bowel follow-through was however unremarkable. He ultimately went to laparoscopy January 8 showing diffuse small bowel erythema and friability without overt thickening of the small bowel. The majority of the SB were centered in the mid SB and not in the terminal ileum as suggested on CT. He was treated with a ten-day course of Cipro, Flagyl and prednisone and his symptoms completely resolved. [facility] IBD panel was sent and the pattern was not consistent IBD however nonspecific inflammation markers were positive . He has no GI complaints today feels well except for low energy and fatigue.  Current Medications, Allergies, Past Medical History, Past Surgical History, Family History and Social History were reviewed in [employer] record.  Physical Exam: General: Well developed , well nourished, no acute distress Head: Normocephalic and atraumatic Eyes:  sclerae anicteric, EOMI Ears: Normal auditory acuity Mouth: No deformity or lesions Lungs: Clear throughout to auscultation Heart: Regular rate and rhythm; no murmurs, rubs or bruits Abdomen: Soft, non tender and non distended. No masses, hepatosplenomegaly or hernias noted. Normal Bowel sounds Musculoskeletal: Symmetrical with no gross deformities  Pulses:  Normal pulses noted Extremities: No clubbing, cyanosis, edema or deformities noted Neurological: Alert oriented x 4, grossly nonfocal Psychological:  Alert and cooperative. Normal mood and affect  Assessment and Recommendations:  1. Enteritis, self limited, presumed infectious, resolved. Prom[facility] IBD markers were negative and nonspecific inflammatory markers were positive. His symptoms have completely resolved. Expectant management.  Consider further evaluation if he has recurrent symptoms although from all the information available at this point this is not consistent with Crohn's disease.  2. IBS-D. Continue glycopyrrolate 1 mg twice a day as needed and Imodium twice a day as needed.

## 2013-07-04 NOTE — Patient Instructions (Signed)
We will contact you regarding the Prom[facility] test results.   Thank you for choosing me and [facility].  Pricilla Riffle. Dagoberto Ligas., MD., Marval Regal

## 2013-07-10 ENCOUNTER — Telehealth: Payer: Self-pay | Admitting: Gastroenterology

## 2013-07-10 NOTE — Telephone Encounter (Signed)
Patient notified of results of the Prometheus test.  I reviewed the results with the patient and all questions answered

## 2013-07-14 ENCOUNTER — Encounter: Payer: Self-pay | Admitting: Internal Medicine

## 2013-07-26 ENCOUNTER — Encounter: Payer: Self-pay | Admitting: Internal Medicine

## 2013-07-26 ENCOUNTER — Ambulatory Visit (INDEPENDENT_AMBULATORY_CARE_PROVIDER_SITE_OTHER): Payer: BC Managed Care – PPO | Admitting: Internal Medicine

## 2013-07-26 VITALS — BP 122/82 | HR 73 | Temp 98.1°F | Wt 190.0 lb

## 2013-07-26 DIAGNOSIS — R109 Unspecified abdominal pain: Secondary | ICD-10-CM

## 2013-07-26 DIAGNOSIS — R7989 Other specified abnormal findings of blood chemistry: Secondary | ICD-10-CM | POA: Insufficient documentation

## 2013-07-26 DIAGNOSIS — R945 Abnormal results of liver function studies: Secondary | ICD-10-CM

## 2013-07-26 DIAGNOSIS — F4323 Adjustment disorder with mixed anxiety and depressed mood: Secondary | ICD-10-CM

## 2013-07-26 DIAGNOSIS — L299 Pruritus, unspecified: Secondary | ICD-10-CM

## 2013-07-26 DIAGNOSIS — L308 Other specified dermatitis: Secondary | ICD-10-CM

## 2013-07-26 LAB — COMPREHENSIVE METABOLIC PANEL
ALBUMIN: 4.4 g/dL (ref 3.5–5.2)
ALT: 74 U/L — ABNORMAL HIGH (ref 0–53)
AST: 37 U/L (ref 0–37)
Alkaline Phosphatase: 51 U/L (ref 39–117)
BILIRUBIN TOTAL: 1.8 mg/dL — AB (ref 0.2–1.2)
BUN: 21 mg/dL (ref 6–23)
CALCIUM: 8.9 mg/dL (ref 8.4–10.5)
CO2: 25 mEq/L (ref 19–32)
Chloride: 104 mEq/L (ref 96–112)
Creat: 0.87 mg/dL (ref 0.50–1.35)
GLUCOSE: 91 mg/dL (ref 70–99)
POTASSIUM: 4.8 meq/L (ref 3.5–5.3)
Sodium: 140 mEq/L (ref 135–145)
TOTAL PROTEIN: 7 g/dL (ref 6.0–8.3)

## 2013-07-26 LAB — LIPASE: Lipase: 17 U/L (ref 0–75)

## 2013-07-26 MED ORDER — ESCITALOPRAM OXALATE 10 MG PO TABS: 10.0000 mg | ORAL_TABLET | Freq: Every day | ORAL | Status: DC

## 2013-07-26 NOTE — Assessment & Plan Note (Signed)
Unclear etiology of pruritis. Appears most consistent with allergic dermatitis, however no recent changes in medications. No new allergens noted in the home or work environment.  No improvement with oral prednisone course. Pt is very concerned about liver dysfunction contributing to symptoms. Will check for viral hepatitis, autoimmune hepatitis as above. If labs are unrevealing, we discussed referral to dermatology for further evaluation.

## 2013-07-26 NOTE — Assessment & Plan Note (Signed)
Symptoms improved with Lexapro, but pt frustrated by low libido on this medication. Will try reducing dose to 10mg  daily. Follow up in  2-4 weeks.

## 2013-07-26 NOTE — Assessment & Plan Note (Signed)
Recently noted to have elevated ALT in setting of enteritis. Will repeat LFTs and will check for viral hepatitis,autoimmune hepatitis, Wilson's with labs.

## 2013-07-26 NOTE — Progress Notes (Signed)
Pre visit review using our clinic review tool, if applicable. No additional management support is needed unless otherwise documented below in the visit note. 

## 2013-07-26 NOTE — Assessment & Plan Note (Signed)
Right sided abdominal pain improving after recent hospitalization for enteritis. Will monitor for now. Follow up with GI as scheduled.

## 2013-07-26 NOTE — Progress Notes (Signed)
Subjective:    Patient ID: Sergio Garcia, male    DOB: January 12, 1976, 38 y.o.   MRN: [id number]  HPI 38YO male with history of anxiety, and recent admission for enteritis presents for follow up.  Right sided abdominal pain has improved over last few weeks. Described as mild aching right lower abdomen. Recently seen by GI physician and has discussed possible repeat colonoscopy later this year. Tolerating full diet. No diarrhea, constipation, nausea, vomiting.  It continues to be a difficult time for him, as he is fighting for custody and access to his children with his ex-wife.  He feels that Lexapro is helpful, but notes limited libido on this medication.  He is concerned about diffuse skin itching, especially at bedtime. Going on for nearly 1 year. Areas that he scratches become red in appearance. No other family members with similar itching. Tried changing laundry detergent and soaps with no improvement. Tried hydrocortisone cream with no improvement. Tried Clobetasol with no improvement. Minimal improvement with use of Benadryl. Was recently on oral steroids for enteritis, and had no improvement in itching with this. He is concerned about possible liver problem causing his itching. LFTs were noted to be elevated on recent admission with ALT 212. This was improved at 58 on recheck. No other systemic symptoms except for for right abdominal pain described above. No fever, chills, change in appetite or energy level.  Review of Systems  Constitutional: Negative for fever, chills, activity change, appetite change, fatigue and unexpected weight change.  Eyes: Negative for visual disturbance.  Respiratory: Negative for cough and shortness of breath.   Cardiovascular: Negative for chest pain, palpitations and leg swelling.  Gastrointestinal: Positive for abdominal pain. Negative for nausea, vomiting, diarrhea, constipation, blood in stool, abdominal distention, anal bleeding and rectal pain.    Genitourinary: Negative for dysuria, urgency and difficulty urinating.  Musculoskeletal: Negative for arthralgias and gait problem.  Skin: Positive for rash. Negative for color change.  Hematological: Negative for adenopathy.  Psychiatric/Behavioral: Positive for sleep disturbance and dysphoric mood. The patient is nervous/anxious.        Objective:    BP 122/82  Pulse 73  Temp(Src) 98.1 F (36.7 C) (Oral)  Wt 190 lb (86.183 kg)  SpO2 97% Physical Exam  Constitutional: He is oriented to person, place, and time. He appears well-developed and well-nourished. No distress.  HENT:  Head: Normocephalic and atraumatic.  Right Ear: External ear normal.  Left Ear: External ear normal.  Nose: Nose normal.  Mouth/Throat: Oropharynx is clear and moist. No oropharyngeal exudate.  Eyes: Conjunctivae and EOM are normal. Pupils are equal, round, and reactive to light. Right eye exhibits no discharge. Left eye exhibits no discharge. No scleral icterus.  Neck: Normal range of motion. Neck supple. No tracheal deviation present. No thyromegaly present.  Cardiovascular: Normal rate, regular rhythm and normal heart sounds.  Exam reveals no gallop and no friction rub.   No murmur heard. Pulmonary/Chest: Effort normal and breath sounds normal. No accessory muscle usage. Not tachypneic. No respiratory distress. He has no decreased breath sounds. He has no wheezes. He has no rhonchi. He has no rales. He exhibits no tenderness.  Abdominal: Soft. Bowel sounds are normal. He exhibits no distension and no mass. There is no hepatosplenomegaly. There is no tenderness. There is no rebound and no guarding.  Musculoskeletal: Normal range of motion. He exhibits no edema.  Lymphadenopathy:    He has no cervical adenopathy.  Neurological: He is alert and oriented to person,  place, and time. No cranial nerve deficit. Coordination normal.  Skin: Skin is warm and dry. Rash noted. Rash is maculopapular and urticarial  (urticaria noted at sites on left arm and neck where he has been scratching). He is not diaphoretic. There is erythema. No pallor.  Psychiatric: He has a normal mood and affect. His behavior is normal. Judgment and thought content normal.          Assessment & Plan:   Problem List Items Addressed This Visit   Abdominal pain, unspecified site     Right sided abdominal pain improving after recent hospitalization for enteritis. Will monitor for now. Follow up with GI as scheduled.    Adjustment disorder with mixed anxiety and depressed mood     Symptoms improved with Lexapro, but pt frustrated by low libido on this medication. Will try reducing dose to 10mg  daily. Follow up in  2-4 weeks.    Relevant Medications      escitalopram (LEXAPRO) tablet   Elevated LFTs     Recently noted to have elevated ALT in setting of enteritis. Will repeat LFTs and will check for viral hepatitis,autoimmune hepatitis, Wilson's with labs.    Relevant Orders      Hepatitis B core antibody, total      Hepatitis B surface antibody      CMV IgM      Hepatitis B core antibody, total      Lipase      HIV antibody      IgG, IgA, IgM      AntiMicrosomal Ab-Liver / Kidney      ANA      HSV(herpes simplex vrs) 1+2 ab-IgG      TSH      Ammonia      Anti-smooth muscle antibody, IgG      Ceruloplasmin      Protime-INR      Comprehensive metabolic panel      Hepatitis C antibody      Hepatitis A antibody, IgM      Hepatitis B surface antigen      CBC with Differential   Pruritic dermatitis - Primary     Unclear etiology of pruritis. Appears most consistent with allergic dermatitis, however no recent changes in medications. No new allergens noted in the home or work environment.  No improvement with oral prednisone course. Pt is very concerned about liver dysfunction contributing to symptoms. Will check for viral hepatitis, autoimmune hepatitis as above. If labs are unrevealing, we discussed referral to  dermatology for further evaluation.        Return in about 2 weeks (around 08/09/2013).

## 2013-07-27 LAB — HEPATITIS A ANTIBODY, IGM: HEP A IGM: NONREACTIVE

## 2013-07-27 LAB — HEPATITIS C ANTIBODY: HCV AB: NEGATIVE

## 2013-07-27 LAB — HEPATITIS B SURFACE ANTIGEN: Hepatitis B Surface Ag: NEGATIVE

## 2013-07-27 LAB — CMV IGM: CMV IgM: <8 AU/mL (ref <30.00)

## 2013-07-27 LAB — TSH: TSH: 1.733 u[IU]/mL (ref 0.350–4.500)

## 2013-07-27 LAB — IGG, IGA, IGM
IgA: 211 mg/dL (ref 68–379)
IgG (Immunoglobin G), Serum: 1020 mg/dL (ref 650–1600)
IgM, Serum: 99 mg/dL (ref 41–251)

## 2013-07-27 LAB — HIV ANTIBODY (ROUTINE TESTING W REFLEX): HIV: NONREACTIVE

## 2013-07-27 LAB — HEPATITIS B CORE ANTIBODY, TOTAL
HEP B C TOTAL AB: NONREACTIVE
HEP B C TOTAL AB: NONREACTIVE

## 2013-07-27 LAB — HEPATITIS B SURFACE ANTIBODY,QUALITATIVE: HEP B S AB: POSITIVE — AB

## 2013-07-27 LAB — CERULOPLASMIN: Ceruloplasmin: 29 mg/dL (ref 20–60)

## 2013-07-27 LAB — AMMONIA: Ammonia: 46 umol/L (ref 16–53)

## 2013-07-29 ENCOUNTER — Encounter: Payer: Self-pay | Admitting: Internal Medicine

## 2013-07-29 ENCOUNTER — Telehealth: Payer: Self-pay | Admitting: *Deleted

## 2013-07-29 LAB — CBC WITH DIFFERENTIAL/PLATELET

## 2013-07-29 LAB — PROTIME-INR

## 2013-07-29 LAB — HSV(HERPES SIMPLEX VRS) I + II AB-IGG
HSV 1 Glycoprotein G Ab, IgG: 10.2 IV — ABNORMAL HIGH
HSV 2 Glycoprotein G Ab, IgG: 0.14 IV

## 2013-07-29 LAB — ANA: Anti Nuclear Antibody(ANA): NEGATIVE

## 2013-07-29 NOTE — Telephone Encounter (Signed)
OK. Can you ask him to return to clinic to repeat a CBC?

## 2013-07-29 NOTE — Telephone Encounter (Signed)
[facility] called saying the could do the pt cbc due to clumping, since the pt had a blue top the tried to get it from that tube but there was also clumping, they said they would like like another sample with a blue, purple and gray  top

## 2013-07-30 LAB — ANTI-SMOOTH MUSCLE ANTIBODY, IGG: SMOOTH MUSCLE AB: 10 U (ref <20)

## 2013-07-30 NOTE — Telephone Encounter (Signed)
Left v-mail for pt to call back, will try again later

## 2013-07-30 NOTE — Telephone Encounter (Signed)
Spoke with pt, he said he would come in tomorrow 03.11.2015

## 2013-07-31 ENCOUNTER — Other Ambulatory Visit (INDEPENDENT_AMBULATORY_CARE_PROVIDER_SITE_OTHER): Payer: BC Managed Care – PPO

## 2013-07-31 DIAGNOSIS — R7989 Other specified abnormal findings of blood chemistry: Secondary | ICD-10-CM

## 2013-07-31 LAB — CBC WITH DIFFERENTIAL/PLATELET
Basophils Absolute: 0 10*3/uL (ref 0.0–0.1)
Basophils Relative: 0.3 % (ref 0.0–3.0)
EOS PCT: 2.5 % (ref 0.0–5.0)
Eosinophils Absolute: 0.2 10*3/uL (ref 0.0–0.7)
HCT: 45 % (ref 39.0–52.0)
Hemoglobin: 15 g/dL (ref 13.0–17.0)
LYMPHS PCT: 42.7 % (ref 12.0–46.0)
Lymphs Abs: 2.5 10*3/uL (ref 0.7–4.0)
MCHC: 33.4 g/dL (ref 30.0–36.0)
MCV: 91.2 fl (ref 78.0–100.0)
MONO ABS: 0.6 10*3/uL (ref 0.1–1.0)
MONOS PCT: 9.6 % (ref 3.0–12.0)
NEUTROS PCT: 44.9 % (ref 43.0–77.0)
Neutro Abs: 2.7 10*3/uL (ref 1.4–7.7)
PLATELETS: 249 10*3/uL (ref 150.0–400.0)
RBC: 4.93 Mil/uL (ref 4.22–5.81)
RDW: 13.6 % (ref 11.5–14.6)
WBC: 5.9 10*3/uL (ref 4.5–10.5)

## 2013-07-31 LAB — PROTIME-INR
INR: 1.1 ratio — AB (ref 0.8–1.0)
Prothrombin Time: 11.1 s (ref 10.2–12.4)

## 2013-07-31 LAB — ANTI-MICROSOMAL ANTIBODY LIVER / KIDNEY: LKM1 Ab: <=20 U (ref <=20.0)

## 2013-08-16 ENCOUNTER — Ambulatory Visit: Payer: BC Managed Care – PPO | Admitting: Internal Medicine

## 2013-08-26 ENCOUNTER — Ambulatory Visit (INDEPENDENT_AMBULATORY_CARE_PROVIDER_SITE_OTHER): Payer: BC Managed Care – PPO | Admitting: Gastroenterology

## 2013-08-26 ENCOUNTER — Encounter: Payer: Self-pay | Admitting: Gastroenterology

## 2013-08-26 ENCOUNTER — Other Ambulatory Visit (INDEPENDENT_AMBULATORY_CARE_PROVIDER_SITE_OTHER): Payer: BC Managed Care – PPO

## 2013-08-26 VITALS — BP 110/72 | HR 80 | Ht 73.0 in | Wt 196.0 lb

## 2013-08-26 DIAGNOSIS — R7401 Elevation of levels of liver transaminase levels: Secondary | ICD-10-CM

## 2013-08-26 DIAGNOSIS — R7402 Elevation of levels of lactic acid dehydrogenase (LDH): Secondary | ICD-10-CM

## 2013-08-26 DIAGNOSIS — R740 Nonspecific elevation of levels of transaminase and lactic acid dehydrogenase [LDH]: Secondary | ICD-10-CM

## 2013-08-26 DIAGNOSIS — K589 Irritable bowel syndrome without diarrhea: Secondary | ICD-10-CM

## 2013-08-26 LAB — HEPATIC FUNCTION PANEL
ALK PHOS: 44 U/L (ref 39–117)
ALT: 31 U/L (ref 0–53)
AST: 23 U/L (ref 0–37)
Albumin: 4.1 g/dL (ref 3.5–5.2)
BILIRUBIN TOTAL: 2 mg/dL — AB (ref 0.3–1.2)
Bilirubin, Direct: 0.2 mg/dL (ref 0.0–0.3)
Total Protein: 6.9 g/dL (ref 6.0–8.3)

## 2013-08-26 LAB — IBC PANEL
Iron: 95 ug/dL (ref 42–165)
SATURATION RATIOS: 29.2 % (ref 20.0–50.0)
Transferrin: 232.1 mg/dL (ref 212.0–360.0)

## 2013-08-26 LAB — IGA: IgA: 202 mg/dL (ref 68–378)

## 2013-08-26 NOTE — Progress Notes (Signed)
    History of Present Illness: This is a 38 year old male who has had a substantial improvement in his abdominal pain. He occasionally has episodes of mild, brief duration lower abdominal pain and mild diarrhea. He was found to have mildly elevated ALT and total bilirubin. He has a history of Gilbert's syndrome. Hepatic serologies and imaging studies are unremarkable so far.  Current Medications, Allergies, Past Medical History, Past Surgical History, Family History and Social History were reviewed in [employer] record.  Physical Exam: General: Well developed , well nourished, no acute distress Head: Normocephalic and atraumatic Eyes:  sclerae anicteric, EOMI Ears: Normal auditory acuity Mouth: No deformity or lesions Lungs: Clear throughout to auscultation Heart: Regular rate and rhythm; no murmurs, rubs or bruits Abdomen: Soft, non tender and non distended. No masses, hepatosplenomegaly or hernias noted. Normal Bowel sounds Musculoskeletal: Symmetrical with no gross deformities  Pulses:  Normal pulses noted Extremities: No clubbing, cyanosis, edema or deformities noted Neurological: Alert oriented x 4, grossly nonfocal Psychological:  Alert and cooperative. Anxious.  Assessment and Recommendations:  1. IBS-D. Episodic mild abdominal pain is likely IBS related. Continue glycopyrrolate 1 mg twice a day. Consider increasing to 2 mg twice a day if pain persists or worsens.  2. Mildly elevated ALT. Etiology unclear. Hepatic serologic evaluation and hepatic imaging studies unremarkable to date. Obtain iron studies and tTG. Repeat LFTs.   3. Rosanna Randy syndrome. Asymptomatic indirect hyperbilirubinemia that is clinically insignificant.

## 2013-08-26 NOTE — Patient Instructions (Signed)
Your physician has requested that you go to the basement for the following lab work before leaving today:Iron, IBC, Ferritin, TTG, IGA, and LFT's.   Thank you for choosing me and [facility].  Pricilla Riffle. Dagoberto Ligas., MD., Marval Regal

## 2013-08-27 ENCOUNTER — Encounter: Payer: Self-pay | Admitting: Internal Medicine

## 2013-08-27 ENCOUNTER — Other Ambulatory Visit: Payer: Self-pay

## 2013-08-27 DIAGNOSIS — R945 Abnormal results of liver function studies: Secondary | ICD-10-CM

## 2013-08-27 DIAGNOSIS — R7989 Other specified abnormal findings of blood chemistry: Secondary | ICD-10-CM

## 2013-08-27 LAB — FERRITIN: FERRITIN: 78.5 ng/mL (ref 22.0–322.0)

## 2013-08-28 ENCOUNTER — Other Ambulatory Visit: Payer: Self-pay | Admitting: Gastroenterology

## 2013-08-28 DIAGNOSIS — R740 Nonspecific elevation of levels of transaminase and lactic acid dehydrogenase [LDH]: Principal | ICD-10-CM

## 2013-08-28 DIAGNOSIS — R7401 Elevation of levels of liver transaminase levels: Secondary | ICD-10-CM

## 2013-08-28 LAB — TISSUE TRANSGLUTAMINASE, IGA: Tissue Transglutaminase Ab, IgA: 3.2 U/mL (ref <20)

## 2013-08-30 ENCOUNTER — Encounter: Payer: Self-pay | Admitting: Internal Medicine

## 2013-08-30 ENCOUNTER — Ambulatory Visit (INDEPENDENT_AMBULATORY_CARE_PROVIDER_SITE_OTHER): Payer: BC Managed Care – PPO | Admitting: Internal Medicine

## 2013-08-30 ENCOUNTER — Telehealth: Payer: Self-pay | Admitting: Internal Medicine

## 2013-08-30 VITALS — BP 118/90 | HR 87 | Temp 99.0°F | Wt 196.1 lb

## 2013-08-30 DIAGNOSIS — R1011 Right upper quadrant pain: Secondary | ICD-10-CM

## 2013-08-30 DIAGNOSIS — G8929 Other chronic pain: Secondary | ICD-10-CM

## 2013-08-30 NOTE — Progress Notes (Signed)
Pre visit review using our clinic review tool, if applicable. No additional management support is needed unless otherwise documented below in the visit note. 

## 2013-08-30 NOTE — Telephone Encounter (Signed)
Patient Information:  Caller Name: Earnest Bailey  Phone: [phone]  Patient: Sergio Garcia, Sergio Garcia  Gender: Male  DOB: 1975/12/27  Age: 38 Years  PCP: Ronette Deter (Adults only)  Office Follow Up:  Does the office need to follow up with this patient?: No  Instructions For The Office: N/A   Symptoms  Reason For Call & Symptoms: Pt's wife states pt has ongoing  right sided pain since 05/2013.  Pt's wife states pt has dizziness.  Reviewed Health History In EMR: Yes  Reviewed Medications In EMR: Yes  Reviewed Allergies In EMR: Yes  Reviewed Surgeries / Procedures: Yes  Date of Onset of Symptoms: 08/29/2013  Guideline(s) Used:  Abdominal Pain - Male  Dizziness  Disposition Per Guideline:   See Today in Office  Reason For Disposition Reached:   Patient wants to be seen  Advice Given:  Call Back If:  You become worse.  Patient Will Follow Care Advice:  YES  Appointment Scheduled:  08/30/2013 16:00:00 Appointment Scheduled Provider: Gwendolyn Grant, MD

## 2013-08-30 NOTE — Assessment & Plan Note (Signed)
Education and reassurance on same provided today

## 2013-08-30 NOTE — Patient Instructions (Signed)
It was good to see you today.  We have reviewed your prior records including labs and tests today  Referral today for HIDA scan to look at gallbladder function. Our office will contact you regarding appointment once made. Your results will be released to [place] (or called to you) after review, usually within 72hours after test completion. If any changes need to be made, you will be notified at that same time.  Medications reviewed and updated, no changes recommended at this time.  Please schedule followup in 6 weeks with Dr Gilford Rile, call sooner if problems.

## 2013-08-30 NOTE — Assessment & Plan Note (Signed)
RUQ pain, chronic since 05/2013 - hospitalization at [facility] for same January 2015 reviewed (enteritis). Imaging 05/24/2013 including ultrasound of abdomen and CT abdomen pelvis reviewed. 05/30/13 dx lap also reviewed. Subsequent GI evaluation since discharge unremarkable for cause of pain. Patient remains concerned about gallbladder problem given the intensity of symptoms after meals and elevation of bilirubin. Will order nuclear scan to evaluate same (HIDA) -patient followup with PCP after test done for further evaluation treatment as needed

## 2013-08-30 NOTE — Progress Notes (Signed)
Subjective:    Patient ID: Sergio Garcia, male    DOB: July 13, 1975, 38 y.o.   MRN: [id number]  HPI PCP= Gilford Rile Seen as acute for continued RUQ pain Reviewed chronic medical issues and interval medical events  Past Medical History  Diagnosis Date  . Depression   . Anxiety   . GERD (gastroesophageal reflux disease)   . Insomnia   . Gastritis   . Gilbert's syndrome     hyperbilirubinemia  . Prostatitis 2008  . Degenerative disc disease   . IBS (irritable bowel syndrome)   . Enteritis   . Inguinal hernia     Review of Systems  Constitutional: Positive for fever (intermittent, low grade). Negative for fatigue.  Respiratory: Negative for cough and shortness of breath.   Cardiovascular: Negative for chest pain and leg swelling.  Gastrointestinal: Positive for abdominal pain (RUQ, worse after meals - ongoing since 05/2013 hosp).       Objective:   Physical Exam  BP 118/90  Pulse 87  Temp(Src) 99 F (37.2 C) (Oral)  Wt 196 lb 1.9 oz (88.959 kg)  SpO2 97% Wt Readings from Last 3 Encounters:  08/30/13 196 lb 1.9 oz (88.959 kg)  08/26/13 196 lb (88.905 kg)  07/26/13 190 lb (86.183 kg)   Constitutional: he appears well-developed and well-nourished. No distress.  Neck: Normal range of motion. Neck supple. No JVD present. No thyromegaly present.  Cardiovascular: Normal rate, regular rhythm and normal heart sounds.  No murmur heard. No BLE edema. Pulmonary/Chest: Effort normal and breath sounds normal. No respiratory distress. he has no wheezes.  Abdomen: soft, minimally tender over right side to palpation, but no rebound or guarding. No rigidity. Bowel sounds present throughout. No mass appreciated Psychiatric: he has a normal mood and affect. His behavior is normal. Judgment and thought content normal.   Lab Results  Component Value Date   WBC 5.9 07/31/2013   HGB 15.0 07/31/2013   HCT 45.0 07/31/2013   PLT 249.0 07/31/2013   GLUCOSE 91 07/26/2013   CHOL 176 06/20/2011   TRIG  94.0 06/20/2011   HDL 38.00* 06/20/2011   LDLCALC 119* 06/20/2011   ALT 31 08/26/2013   AST 23 08/26/2013   NA 140 07/26/2013   K 4.8 07/26/2013   CL 104 07/26/2013   CREATININE 0.87 07/26/2013   BUN 21 07/26/2013   CO2 25 07/26/2013   TSH 1.733 07/26/2013   INR 1.1* 07/31/2013    Ct Angio Chest Pe W/cm &/or Wo Cm  12/26/2012   *RADIOLOGY REPORT*  Clinical Data: Increasing shortness of breath over the last several weeks, recent chest pain  CT ANGIOGRAPHY CHEST  Technique:  Multidetector CT imaging of the chest using the standard protocol during bolus administration of intravenous contrast. Multiplanar reconstructed images including MIPs were obtained and reviewed to evaluate the vascular anatomy.  Contrast: 68mL OMNIPAQUE IOHEXOL 350 MG/ML SOLN  Comparison: Chest x-ray of 12/18/2012  Findings: The pulmonary arteries are well opacified and there is no evidence of acute pulmonary embolism.  The thoracic aorta is not as well opacified but no definite acute abnormality is seen.  No mediastinal or hilar adenopathy is noted.  The thyroid gland is unremarkable.  On the lung window images, no parenchymal abnormality is seen.  No lung nodule is noted.  There is no evidence of pleural effusion. The central airway is widely patent.  On bone window images no bony abnormality is seen.  The thoracic vertebrae are in normal alignment with no acute  abnormality noted.  IMPRESSION: Negative CT angio of the chest.  No evidence of pulmonary embolism. No acute abnormality is seen.   Original Report Authenticated By: Ivar Drape, M.D.       Assessment & Plan:   Problem List Items Addressed This Visit   GILBERT'S SYNDROME     Education and reassurance on same provided today    RUQ abdominal pain     RUQ pain, chronic since 05/2013 - hospitalization at [facility] for same January 2015 reviewed (enteritis). Imaging 05/24/2013 including ultrasound of abdomen and CT abdomen pelvis reviewed. 05/30/13 dx lap also reviewed. Subsequent GI  evaluation since discharge unremarkable for cause of pain. Patient remains concerned about gallbladder problem given the intensity of symptoms after meals and elevation of bilirubin. Will order nuclear scan to evaluate same (HIDA) -patient followup with PCP after test done for further evaluation treatment as needed     Other Visit Diagnoses   Chronic RUQ pain    -  Primary    Relevant Orders       NM Hepato W/EjeCT Fract

## 2013-08-30 NOTE — Telephone Encounter (Signed)
Fwd to Dr. Walker 

## 2013-09-11 ENCOUNTER — Encounter (HOSPITAL_COMMUNITY)
Admission: RE | Admit: 2013-09-11 | Discharge: 2013-09-11 | Disposition: A | Discharge status: Home / Self Care | Payer: BC Managed Care – PPO | Source: Ambulatory Visit | Attending: Internal Medicine | Admitting: Internal Medicine

## 2013-09-11 DIAGNOSIS — R1011 Right upper quadrant pain: Secondary | ICD-10-CM | POA: Insufficient documentation

## 2013-09-11 DIAGNOSIS — G8929 Other chronic pain: Secondary | ICD-10-CM | POA: Insufficient documentation

## 2013-09-11 MED ORDER — TECHNETIUM TC 99M MEBROFENIN IV KIT
5.4000 | PACK | Freq: Once | INTRAVENOUS | Status: AC | PRN
  Administered 2013-09-11: 5 via INTRAVENOUS

## 2013-09-19 ENCOUNTER — Encounter: Payer: Self-pay | Admitting: Adult Health

## 2013-09-19 ENCOUNTER — Ambulatory Visit (INDEPENDENT_AMBULATORY_CARE_PROVIDER_SITE_OTHER): Payer: BC Managed Care – PPO | Admitting: Adult Health

## 2013-09-19 VITALS — BP 106/62 | HR 76 | Temp 98.2°F | Resp 14 | Wt 201.0 lb

## 2013-09-19 DIAGNOSIS — H9209 Otalgia, unspecified ear: Secondary | ICD-10-CM

## 2013-09-19 DIAGNOSIS — H9202 Otalgia, left ear: Secondary | ICD-10-CM | POA: Insufficient documentation

## 2013-09-19 DIAGNOSIS — J029 Acute pharyngitis, unspecified: Secondary | ICD-10-CM | POA: Insufficient documentation

## 2013-09-19 LAB — POCT RAPID STREP A (OFFICE): RAPID STREP A SCREEN: NEGATIVE

## 2013-09-19 MED ORDER — AZITHROMYCIN 250 MG PO TABS: ORAL_TABLET | ORAL | Status: DC

## 2013-09-19 MED ORDER — ANTIPYRINE-BENZOCAINE 5.4-1.4 % OT SOLN: 3.0000 [drp] | OTIC | Status: DC | PRN

## 2013-09-19 NOTE — Patient Instructions (Signed)
  Start azithromycin 2 tablets today then one tablet daily for the next 4 days.  Gargle with either Listerine with salt water. He may also use Chloraseptic spray for your sore throat.  For your left ear pain, place 3-4 drops of the medication prescribed (Auralgan) into the left ear every 2 hours as needed for pain.  Tylenol for pain or if you develop a fever.  Return if your symptoms are not improved within 4-5 days or sooner if necessary.

## 2013-09-19 NOTE — Progress Notes (Signed)
Pre visit review using our clinic review tool, if applicable. No additional management support is needed unless otherwise documented below in the visit note. 

## 2013-09-19 NOTE — Progress Notes (Signed)
Patient ID: Sergio Garcia, male   DOB: 1976-04-25, 38 y.o.   MRN: [id number]    Subjective:    Patient ID: Sergio Garcia, male    DOB: [birth year], 38 y.o.   MRN: [id number]  HPI Patient is a pleasant 38 year old male who presents to clinic with sore throat and left ear pain that has been ongoing since Tuesday. He denies fever. He reports throat is painful mainly when he swallows.   Past Medical History  Diagnosis Date  . Depression   . Anxiety   . GERD (gastroesophageal reflux disease)   . Insomnia   . Gastritis   . Gilbert's syndrome     hyperbilirubinemia  . Prostatitis 2008  . Degenerative disc disease   . IBS (irritable bowel syndrome)   . Enteritis   . Inguinal hernia     Current Outpatient Prescriptions on File Prior to Visit  Medication Sig Dispense Refill  . clonazePAM (KLONOPIN) 0.5 MG tablet Take 1 tablet (0.5 mg total) by mouth 2 (two) times daily as needed for anxiety.  60 tablet  1  . dexlansoprazole (DEXILANT) 60 MG capsule Take 60 mg by mouth daily.      Marland Kitchen escitalopram (LEXAPRO) 10 MG tablet Take 1 tablet (10 mg total) by mouth daily.  30 tablet  2  . glycopyrrolate (ROBINUL) 1 MG tablet Take 1 mg by mouth 2 (two) times daily as needed.      . ranitidine (ZANTAC) 150 MG capsule Take 150 mg by mouth daily.       No current facility-administered medications on file prior to visit.     Review of Systems  HENT: Positive for ear pain and sore throat.   Respiratory: Negative for cough, chest tightness, shortness of breath and wheezing.   Cardiovascular: Negative.   Neurological: Negative.  Negative for dizziness and headaches.  Psychiatric/Behavioral: Negative.   All other systems reviewed and are negative.      Objective:  BP 106/62  Pulse 76  Temp(Src) 98.2 F (36.8 C) (Oral)  Resp 14  Wt 201 lb (91.173 kg)  SpO2 96%   Physical Exam  Constitutional: He is oriented to person, place, and time. He appears well-developed and well-nourished. No distress.    HENT:  Head: Normocephalic and atraumatic.  Erythematous throat, tonsils. Bilateral ear canals are swollen and red.  Cardiovascular: Normal rate and regular rhythm.   Pulmonary/Chest: Effort normal. No respiratory distress.  Lymphadenopathy:    He has no cervical adenopathy.  Neurological: He is alert and oriented to person, place, and time.  Skin: Skin is warm and dry.  Psychiatric: He has a normal mood and affect. His behavior is normal. Judgment and thought content normal.       Assessment & Plan:   1. Sore throat Negative from strep. His throat is significantly erythematous. I am treating him empirically for strep Start Azithromycin - POCT rapid strep A  2. Otalgia of left ear Auralgan ear drops as directed.

## 2013-11-01 ENCOUNTER — Encounter: Payer: Self-pay | Admitting: Internal Medicine

## 2013-11-01 ENCOUNTER — Ambulatory Visit (INDEPENDENT_AMBULATORY_CARE_PROVIDER_SITE_OTHER): Payer: BC Managed Care – PPO | Admitting: Internal Medicine

## 2013-11-01 VITALS — BP 112/82 | HR 69 | Temp 98.0°F | Ht 73.0 in | Wt 193.8 lb

## 2013-11-01 DIAGNOSIS — F4323 Adjustment disorder with mixed anxiety and depressed mood: Secondary | ICD-10-CM

## 2013-11-01 DIAGNOSIS — F411 Generalized anxiety disorder: Secondary | ICD-10-CM

## 2013-11-01 DIAGNOSIS — IMO0002 Reserved for concepts with insufficient information to code with codable children: Secondary | ICD-10-CM | POA: Insufficient documentation

## 2013-11-01 MED ORDER — SERTRALINE HCL 50 MG PO TABS: 50.0000 mg | ORAL_TABLET | Freq: Every day | ORAL | Status: DC

## 2013-11-01 MED ORDER — CLONAZEPAM 0.5 MG PO TABS: 0.5000 mg | ORAL_TABLET | Freq: Three times a day (TID) | ORAL | Status: DC | PRN

## 2013-11-01 NOTE — Progress Notes (Signed)
Pre visit review using our clinic review tool, if applicable. No additional management support is needed unless otherwise documented below in the visit note. 

## 2013-11-01 NOTE — Assessment & Plan Note (Signed)
Son diagnosed with Gaucher's. 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 He would like to speak with genetic counselor about this, so will set up.

## 2013-11-01 NOTE — Progress Notes (Signed)
Subjective:    Patient ID: Sergio Garcia, male    DOB: 1975/12/25, 38 y.o.   MRN: [id number]  HPI 38YO male presents for acute visit.  Anxiety has increased. Feels that Lexapro is not helping. Also increased depressed mood. Difficult to get to work and get out of bed. Also anxious about issues with his son. Son was diagnosed with Gaucher's disease. He would like to get more information about this disorder and how it applies to him.  Review of Systems  Constitutional: Negative for fever, chills, activity change, appetite change, fatigue and unexpected weight change.  Eyes: Negative for visual disturbance.  Respiratory: Negative for cough and shortness of breath.   Cardiovascular: Negative for chest pain, palpitations and leg swelling.  Gastrointestinal: Negative for abdominal pain and abdominal distention.  Genitourinary: Negative for dysuria, urgency and difficulty urinating.  Musculoskeletal: Negative for arthralgias and gait problem.  Skin: Negative for color change and rash.  Hematological: Negative for adenopathy.  Psychiatric/Behavioral: Positive for sleep disturbance and dysphoric mood. The patient is nervous/anxious.        Objective:    BP 112/82  Pulse 69  Temp(Src) 98 F (36.7 C) (Oral)  Ht 6\' 1"  (1.854 m)  Wt 193 lb 12 oz (87.884 kg)  BMI 25.57 kg/m2  SpO2 97% Physical Exam  Constitutional: He is oriented to person, place, and time. He appears well-developed and well-nourished. No distress.  HENT:  Head: Normocephalic and atraumatic.  Right Ear: External ear normal.  Left Ear: External ear normal.  Nose: Nose normal.  Mouth/Throat: Oropharynx is clear and moist. No oropharyngeal exudate.  Eyes: Conjunctivae and EOM are normal. Pupils are equal, round, and reactive to light. Right eye exhibits no discharge. Left eye exhibits no discharge. No scleral icterus.  Neck: Normal range of motion. Neck supple. No tracheal deviation present. No thyromegaly present.    Cardiovascular: Normal rate, regular rhythm and normal heart sounds.  Exam reveals no gallop and no friction rub.   No murmur heard. Pulmonary/Chest: Effort normal and breath sounds normal. No accessory muscle usage. Not tachypneic. No respiratory distress. He has no decreased breath sounds. He has no wheezes. He has no rhonchi. He has no rales. He exhibits no tenderness.  Musculoskeletal: Normal range of motion. He exhibits no edema.  Lymphadenopathy:    He has no cervical adenopathy.  Neurological: He is alert and oriented to person, place, and time. No cranial nerve deficit. Coordination normal.  Skin: Skin is warm and dry. No rash noted. He is not diaphoretic. No erythema. No pallor.  Psychiatric: His speech is normal and behavior is normal. Judgment and thought content normal. His mood appears anxious. He exhibits a depressed mood. He expresses no suicidal ideation.          Assessment & Plan:   Problem List Items Addressed This Visit     Unprioritized   Adjustment disorder with mixed anxiety and depressed mood   Relevant Medications      clonazePAM (KLONOPIN)  tablet   Generalized anxiety disorder - Primary     Pt with generalized anxiety, worsened by recent situation with ongoing divorce and his son's health issues. Offered support today. Will stop lexapro and change to Sertraline. Increase Clonazepam to tid prn. Discussed referral to psychiatry, but he would like to hold off for now. Follow up 4 weeks and prn.    Relevant Medications      sertraline (ZOLOFT) tablet   Genetic counseling     Son diagnosed  with Gaucher's. 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 He would like to speak with genetic counselor about this, so will set up.    Relevant Orders      Ambulatory referral to Genetics       Return in about 4 weeks (around 11/29/2013) for Recheck.

## 2013-11-01 NOTE — Assessment & Plan Note (Signed)
Pt with generalized anxiety, worsened by recent situation with ongoing divorce and his son's health issues. Offered support today. Will stop lexapro and change to Sertraline. Increase Clonazepam to tid prn. Discussed referral to psychiatry, but he would like to hold off for now. Follow up 4 weeks and prn.

## 2013-11-01 NOTE — Patient Instructions (Signed)
Stop Lexapro.  Start Sertraline 50mg  daily at bedtime.  Increase Clonazepam to 0.5 mg up to three times daily as needed for anxiety.  We will set up genetic evaluation at [facility].

## 2013-11-05 ENCOUNTER — Telehealth: Payer: Self-pay | Admitting: *Deleted

## 2013-11-05 NOTE — Telephone Encounter (Signed)
Pt returned my and I confirmed 12/04/13 genetic appt w/ him.  Sherron Monday at referring to make her aware.

## 2013-11-05 NOTE — Telephone Encounter (Signed)
Received a call from [place] at pts PCP requesting a genetic appt to be scheduled.  Called and left a message for the pt to return my call so I can do so.

## 2013-11-06 ENCOUNTER — Encounter (HOSPITAL_COMMUNITY): Payer: Self-pay | Admitting: Emergency Medicine

## 2013-11-06 ENCOUNTER — Emergency Department (HOSPITAL_COMMUNITY)
Admission: EM | Admit: 2013-11-06 | Discharge: 2013-11-06 | Disposition: A | Discharge status: Home / Self Care | Payer: Worker's Compensation | Attending: Emergency Medicine | Admitting: Emergency Medicine

## 2013-11-06 DIAGNOSIS — S61209A Unspecified open wound of unspecified finger without damage to nail, initial encounter: Secondary | ICD-10-CM | POA: Insufficient documentation

## 2013-11-06 DIAGNOSIS — Z79899 Other long term (current) drug therapy: Secondary | ICD-10-CM | POA: Insufficient documentation

## 2013-11-06 DIAGNOSIS — Z8639 Personal history of other endocrine, nutritional and metabolic disease: Secondary | ICD-10-CM | POA: Insufficient documentation

## 2013-11-06 DIAGNOSIS — Z862 Personal history of diseases of the blood and blood-forming organs and certain disorders involving the immune mechanism: Secondary | ICD-10-CM | POA: Diagnosis not present

## 2013-11-06 DIAGNOSIS — F411 Generalized anxiety disorder: Secondary | ICD-10-CM | POA: Diagnosis not present

## 2013-11-06 DIAGNOSIS — W268XXA Contact with other sharp object(s), not elsewhere classified, initial encounter: Secondary | ICD-10-CM | POA: Diagnosis not present

## 2013-11-06 DIAGNOSIS — Z8739 Personal history of other diseases of the musculoskeletal system and connective tissue: Secondary | ICD-10-CM | POA: Insufficient documentation

## 2013-11-06 DIAGNOSIS — F329 Major depressive disorder, single episode, unspecified: Secondary | ICD-10-CM | POA: Insufficient documentation

## 2013-11-06 DIAGNOSIS — Y990 Civilian activity done for income or pay: Secondary | ICD-10-CM | POA: Diagnosis not present

## 2013-11-06 DIAGNOSIS — Y9389 Activity, other specified: Secondary | ICD-10-CM | POA: Insufficient documentation

## 2013-11-06 DIAGNOSIS — Y9289 Other specified places as the place of occurrence of the external cause: Secondary | ICD-10-CM | POA: Insufficient documentation

## 2013-11-06 DIAGNOSIS — F3289 Other specified depressive episodes: Secondary | ICD-10-CM | POA: Insufficient documentation

## 2013-11-06 DIAGNOSIS — S61219A Laceration without foreign body of unspecified finger without damage to nail, initial encounter: Secondary | ICD-10-CM

## 2013-11-06 DIAGNOSIS — Z87448 Personal history of other diseases of urinary system: Secondary | ICD-10-CM | POA: Diagnosis not present

## 2013-11-06 DIAGNOSIS — K219 Gastro-esophageal reflux disease without esophagitis: Secondary | ICD-10-CM | POA: Insufficient documentation

## 2013-11-06 NOTE — ED Provider Notes (Signed)
CSN: [id number]     Arrival date & time 11/06/13  2055 History  This chart was scribed for non-physician practitioner, Quincy Carnes, PA-C, working with Janice Norrie, MD by Ladene Artist, ED Scribe. This patient was seen in room [place] and the patient's care was started at 10:16 PM.   Chief Complaint  Patient presents with  . Extremity Laceration   The history is provided by the patient. No language interpreter was used.   HPI Comments: Sergio Garcia is a 38 y.o. male who presents to the Emergency Department complaining of L little finger laceration sustained today while at work. Pt states that he cut himself on a razor blade while he was cutting a piece of rubber. Bleeding is controlled. He denies numbness. Tetanus is UTD.   Past Medical History  Diagnosis Date  . Depression   . Anxiety   . GERD (gastroesophageal reflux disease)   . Insomnia   . Gastritis   . Gilbert's syndrome     hyperbilirubinemia  . Prostatitis 2008  . Degenerative disc disease   . IBS (irritable bowel syndrome)   . Enteritis   . Inguinal hernia    Past Surgical History  Procedure Laterality Date  . Esophagogastroduodenoscopy  09/2004    gastritis acute and duodenitis  . Back surgery      2 ruptured discs  . Exploratory laparotomy     Family History  Problem Relation Age of Onset  . Colon polyps Mother   . Heart disease Father   . Heart attack Father     x 2  . Pancreatic cancer Maternal Grandfather    History  Substance Use Topics  . Smoking status: Never Smoker   . Smokeless tobacco: Never Used  . Alcohol Use: No    Review of Systems  Skin: Positive for wound.  Neurological: Negative for numbness.  All other systems reviewed and are negative.  Allergies  Morphine and related and Tdap  Home Medications   Prior to Admission medications   Medication Sig Start Date End Date Taking? Authorizing Provider  clonazePAM (KLONOPIN) 0.5 MG tablet Take 1 tablet (0.5 mg total) by mouth 3  (three) times daily as needed for anxiety. 11/01/13   Jackolyn Confer, MD  dexlansoprazole (DEXILANT) 60 MG capsule Take 60 mg by mouth daily.    Historical Provider, MD  glycopyrrolate (ROBINUL) 1 MG tablet Take 1 mg by mouth 2 (two) times daily as needed. 01/23/13   Ladene Artist, MD  ranitidine (ZANTAC) 150 MG capsule Take 150 mg by mouth daily.    Historical Provider, MD  sertraline (ZOLOFT) 50 MG tablet Take 1 tablet (50 mg total) by mouth daily. 11/01/13   Jackolyn Confer, MD   Triage Vitals: BP 122/81  Pulse 76  Temp(Src) 98.3 F (36.8 C) (Oral)  Resp 20  Ht 6\' 2"  (1.88 m)  Wt 193 lb (87.544 kg)  BMI 24.77 kg/m2  SpO2 96% Physical Exam  Nursing note and vitals reviewed. Constitutional: He is oriented to person, place, and time. He appears well-developed and well-nourished.  HENT:  Head: Normocephalic and atraumatic.  Mouth/Throat: Oropharynx is clear and moist.  Eyes: Conjunctivae and EOM are normal. Pupils are equal, round, and reactive to light.  Neck: Normal range of motion.  Cardiovascular: Normal rate, regular rhythm and normal heart sounds.   Pulmonary/Chest: Effort normal and breath sounds normal.  Abdominal: Soft. Bowel sounds are normal.  Musculoskeletal: Normal range of motion.  Left hand: He exhibits laceration. He exhibits normal range of motion, no tenderness, no bony tenderness, normal two-point discrimination, normal capillary refill, no deformity and no swelling. Normal sensation noted. Normal strength noted.  L little finger with small, superficial, v-shaped laceration to medial aspect of finger No active bleeding No foreign body or signs of infection  Full flexion and extension of finger maintained Sensation intact throughout hand  Neurological: He is alert and oriented to person, place, and time.  Skin: Skin is warm and dry. Laceration noted.  Psychiatric: He has a normal mood and affect.   ED Course  Procedures (including critical care  time)  LACERATION REPAIR Performed by: Larene Pickett Authorized by: Larene Pickett Consent: Verbal consent obtained. Risks and benefits: risks, benefits and alternatives were discussed Consent given by: patient Patient identity confirmed: provided demographic data Prepped and Draped in normal sterile fashion Wound explored  Laceration Location: left little finger  Laceration Length: 1cm, v-shaped  No Foreign Bodies seen or palpated  Anesthesia: none  Local anesthetic: none  Anesthetic total: 0 mL  Irrigation method: syringe Amount of cleaning: standard  Skin closure: dermbond  Number of sutures: 0  Technique: n/a  Patient tolerance: Patient tolerated the procedure well with no immediate complications.  DIAGNOSTIC STUDIES: Oxygen Saturation is 96% on RA, adequate by my interpretation.    COORDINATION OF CARE: 10:18 PM Discussed treatment plan with pt at bedside and pt agreed to plan.  Labs Review Labs Reviewed - No data to display  Imaging Review No results found.   EKG Interpretation None      MDM   Final diagnoses:  Laceration of finger of left hand   Tetanus updated.  Wound cleansed and repaired with dermabond, pt tolerated well.  Will FU with PCP.  Discussed plan with patient, he/she acknowledged understanding and agreed with plan of care.  Return precautions given for new or worsening symptoms.  I personally performed the services described in this documentation, which was scribed in my presence. The recorded information has been reviewed and is accurate.  Larene Pickett, PA-C 11/06/13 2352

## 2013-11-06 NOTE — ED Notes (Signed)
Per pt report: Pt was a work and cut himself on a razor blade on his left little finger.  Bleeding controlled. Pt a/o x 4. Skin warm and dry.

## 2013-11-07 NOTE — ED Provider Notes (Signed)
Medical screening examination/treatment/procedure(s) were performed by non-physician practitioner and as supervising physician I was immediately available for consultation/collaboration.   EKG Interpretation None      Rolland Porter, MD, Abram Sander   Janice Norrie, MD 11/07/13 1501

## 2013-11-29 ENCOUNTER — Ambulatory Visit (INDEPENDENT_AMBULATORY_CARE_PROVIDER_SITE_OTHER): Payer: BC Managed Care – PPO | Admitting: Internal Medicine

## 2013-11-29 ENCOUNTER — Encounter: Payer: Self-pay | Admitting: Internal Medicine

## 2013-11-29 VITALS — BP 102/76 | HR 72 | Temp 98.1°F | Ht 73.0 in | Wt 197.5 lb

## 2013-11-29 DIAGNOSIS — F411 Generalized anxiety disorder: Secondary | ICD-10-CM

## 2013-11-29 MED ORDER — SERTRALINE HCL 100 MG PO TABS: 100.0000 mg | ORAL_TABLET | Freq: Every day | ORAL | Status: DC

## 2013-11-29 NOTE — Progress Notes (Signed)
Pre visit review using our clinic review tool, if applicable. No additional management support is needed unless otherwise documented below in the visit note. 

## 2013-11-29 NOTE — Progress Notes (Signed)
   Subjective:    Patient ID: Sergio Garcia, male    DOB: [birth year], 38 y.o.   MRN: [id number]  HPI 38YO male presents for follow up.  Anxiety - Symptoms improved with Sertraline. Continues to have"good days and bad days."  Continues in custody battle with wife.  Some recent aching in neck and back after lifting objects at work. Plans to see chiropractor today.  Review of Systems  Constitutional: Negative for fever, chills, activity change, appetite change, fatigue and unexpected weight change.  Eyes: Negative for visual disturbance.  Respiratory: Negative for cough and shortness of breath.   Cardiovascular: Negative for chest pain, palpitations and leg swelling.  Gastrointestinal: Negative for abdominal pain and abdominal distention.  Genitourinary: Negative for dysuria, urgency and difficulty urinating.  Musculoskeletal: Positive for myalgias and neck pain. Negative for arthralgias and gait problem.  Skin: Negative for color change and rash.  Hematological: Negative for adenopathy.  Psychiatric/Behavioral: Positive for dysphoric mood. Negative for sleep disturbance. The patient is nervous/anxious.        Objective:    BP 102/76  Pulse 72  Temp(Src) 98.1 F (36.7 C) (Oral)  Ht 6\' 1"  (1.854 m)  Wt 197 lb 8 oz (89.585 kg)  BMI 26.06 kg/m2  SpO2 97% Physical Exam  Constitutional: He is oriented to person, place, and time. He appears well-developed and well-nourished. No distress.  HENT:  Head: Normocephalic and atraumatic.  Right Ear: External ear normal.  Left Ear: External ear normal.  Nose: Nose normal.  Mouth/Throat: Oropharynx is clear and moist. No oropharyngeal exudate.  Eyes: Conjunctivae and EOM are normal. Pupils are equal, round, and reactive to light. Right eye exhibits no discharge. Left eye exhibits no discharge. No scleral icterus.  Neck: Normal range of motion. Neck supple. No tracheal deviation present. No thyromegaly present.  Cardiovascular: Normal rate,  regular rhythm and normal heart sounds.  Exam reveals no gallop and no friction rub.   No murmur heard. Pulmonary/Chest: Effort normal and breath sounds normal. No accessory muscle usage. Not tachypneic. No respiratory distress. He has no decreased breath sounds. He has no wheezes. He has no rhonchi. He has no rales. He exhibits no tenderness.  Musculoskeletal: Normal range of motion. He exhibits no edema.  Lymphadenopathy:    He has no cervical adenopathy.  Neurological: He is alert and oriented to person, place, and time. No cranial nerve deficit. Coordination normal.  Skin: Skin is warm and dry. No rash noted. He is not diaphoretic. No erythema. No pallor.  Psychiatric: His behavior is normal. Judgment and thought content normal. His mood appears anxious.          Assessment & Plan:   Problem List Items Addressed This Visit     Unprioritized   Generalized anxiety disorder - Primary     Symptoms improved but not completely controlled. Will increase Sertraline to 100mg  daily. Continue prn Clonazepam. Follow up in 4 weeks or as needed.    Relevant Medications      sertraline (ZOLOFT) tablet       Return in about 4 weeks (around 12/27/2013) for Recheck.

## 2013-11-29 NOTE — Assessment & Plan Note (Signed)
Symptoms improved but not completely controlled. Will increase Sertraline to 100mg  daily. Continue prn Clonazepam. Follow up in 4 weeks or as needed.

## 2013-11-29 NOTE — Patient Instructions (Signed)
Increase Sertraline to 100mg  daily.  Follow up in 4 weeks or sooner as needed.

## 2013-12-04 ENCOUNTER — Other Ambulatory Visit: Payer: BC Managed Care – PPO

## 2013-12-04 ENCOUNTER — Telehealth: Payer: Self-pay | Admitting: Genetic Counselor

## 2013-12-04 NOTE — Telephone Encounter (Signed)
LEFT MESSAGE INFORMING PATIENT THAT APPT HAD BEEN CANCEL PATIENT NEEDS TO SPEAK W/GENETICIST ABOUT GAUCHER'S.

## 2013-12-31 ENCOUNTER — Encounter: Payer: Self-pay | Admitting: Internal Medicine

## 2013-12-31 ENCOUNTER — Ambulatory Visit: Payer: Self-pay | Admitting: Internal Medicine

## 2013-12-31 ENCOUNTER — Ambulatory Visit (INDEPENDENT_AMBULATORY_CARE_PROVIDER_SITE_OTHER): Payer: BC Managed Care – PPO | Admitting: Internal Medicine

## 2013-12-31 VITALS — BP 110/80 | HR 76 | Temp 98.1°F | Ht 73.0 in | Wt 193.5 lb

## 2013-12-31 DIAGNOSIS — M25579 Pain in unspecified ankle and joints of unspecified foot: Secondary | ICD-10-CM

## 2013-12-31 DIAGNOSIS — M25571 Pain in right ankle and joints of right foot: Secondary | ICD-10-CM | POA: Insufficient documentation

## 2013-12-31 DIAGNOSIS — K824 Cholesterolosis of gallbladder: Secondary | ICD-10-CM

## 2013-12-31 DIAGNOSIS — R1011 Right upper quadrant pain: Secondary | ICD-10-CM | POA: Insufficient documentation

## 2013-12-31 DIAGNOSIS — R112 Nausea with vomiting, unspecified: Secondary | ICD-10-CM | POA: Insufficient documentation

## 2013-12-31 LAB — COMPREHENSIVE METABOLIC PANEL
ALBUMIN: 4.2 g/dL (ref 3.5–5.2)
ALK PHOS: 47 U/L (ref 39–117)
ALT: 25 U/L (ref 0–53)
AST: 32 U/L (ref 0–37)
BILIRUBIN TOTAL: 4.2 mg/dL — AB (ref 0.2–1.2)
BUN: 18 mg/dL (ref 6–23)
CO2: 25 mEq/L (ref 19–32)
Calcium: 9.3 mg/dL (ref 8.4–10.5)
Chloride: 104 mEq/L (ref 96–112)
Creatinine, Ser: 1 mg/dL (ref 0.4–1.5)
GFR: 86.84 mL/min (ref >60.00)
GLUCOSE: 89 mg/dL (ref 70–99)
POTASSIUM: 3.8 meq/L (ref 3.5–5.1)
SODIUM: 139 meq/L (ref 135–145)
Total Protein: 7.5 g/dL (ref 6.0–8.3)

## 2013-12-31 LAB — CBC WITH DIFFERENTIAL/PLATELET
BASOS PCT: 0.4 % (ref 0.0–3.0)
Basophils Absolute: 0 10*3/uL (ref 0.0–0.1)
Eosinophils Absolute: 0.1 10*3/uL (ref 0.0–0.7)
Eosinophils Relative: 1.4 % (ref 0.0–5.0)
HEMATOCRIT: 45.3 % (ref 39.0–52.0)
Hemoglobin: 15.4 g/dL (ref 13.0–17.0)
LYMPHS PCT: 40.9 % (ref 12.0–46.0)
Lymphs Abs: 2.2 10*3/uL (ref 0.7–4.0)
MCHC: 34.1 g/dL (ref 30.0–36.0)
MCV: 88.7 fl (ref 78.0–100.0)
MONOS PCT: 12.3 % — AB (ref 3.0–12.0)
Monocytes Absolute: 0.6 10*3/uL (ref 0.1–1.0)
Neutro Abs: 2.4 10*3/uL (ref 1.4–7.7)
Neutrophils Relative %: 45 % (ref 43.0–77.0)
Platelets: 251 10*3/uL (ref 150.0–400.0)
RBC: 5.11 Mil/uL (ref 4.22–5.81)
RDW: 12.9 % (ref 11.5–15.5)
WBC: 5.3 10*3/uL (ref 4.0–10.5)

## 2013-12-31 MED ORDER — ONDANSETRON 8 MG PO TBDP: 8.0000 mg | ORAL_TABLET | Freq: Three times a day (TID) | ORAL | Status: DC | PRN

## 2013-12-31 NOTE — Progress Notes (Signed)
Subjective:    Patient ID: Sergio Garcia, male    DOB: 1975-12-09, 38 y.o.   MRN: [id number]  HPI 38YO male presents for acute visit.  Last 3-4 weeks having nausea and vomiting with eating. Tried limiting foods to bland foods with no improvement. Also having some loose stools. No blood in vomit or stool.  Some right upper quadrant pain. Tried taking some Vit C with water and had vomiting.  Also twisted right ankle about 2 months ago. Ankle was swollen. Pain with walking continues. No imaging performed.  Review of Systems  Constitutional: Negative for fever, chills, activity change, appetite change, fatigue and unexpected weight change.  Eyes: Negative for visual disturbance.  Respiratory: Negative for cough and shortness of breath.   Cardiovascular: Negative for chest pain, palpitations and leg swelling.  Gastrointestinal: Positive for nausea, vomiting, abdominal pain and diarrhea. Negative for abdominal distention.  Genitourinary: Negative for dysuria, urgency and difficulty urinating.  Musculoskeletal: Positive for arthralgias and myalgias. Negative for gait problem.  Skin: Negative for color change and rash.  Hematological: Negative for adenopathy.  Psychiatric/Behavioral: Negative for sleep disturbance and dysphoric mood. The patient is not nervous/anxious.        Objective:    BP 110/80  Pulse 76  Temp(Src) 98.1 F (36.7 C) (Oral)  Ht 6' 1"  (1.854 m)  Wt 193 lb 8 oz (87.771 kg)  BMI 25.53 kg/m2  SpO2 97% Physical Exam  Constitutional: He is oriented to person, place, and time. He appears well-developed and well-nourished. No distress.  HENT:  Head: Normocephalic and atraumatic.  Right Ear: External ear normal.  Left Ear: External ear normal.  Nose: Nose normal.  Mouth/Throat: Oropharynx is clear and moist. No oropharyngeal exudate.  Eyes: Conjunctivae and EOM are normal. Pupils are equal, round, and reactive to light. Right eye exhibits no discharge. Left eye  exhibits no discharge. No scleral icterus.  Neck: Normal range of motion. Neck supple. No tracheal deviation present. No thyromegaly present.  Cardiovascular: Normal rate, regular rhythm and normal heart sounds.  Exam reveals no gallop and no friction rub.   No murmur heard. Pulmonary/Chest: Effort normal and breath sounds normal. No respiratory distress. He has no wheezes. He has no rales. He exhibits no tenderness.  Abdominal: Soft. Bowel sounds are normal. He exhibits no distension and no mass. There is tenderness (right upper quadrant). There is no rebound and no guarding.  Musculoskeletal: Normal range of motion. He exhibits no edema.  Lymphadenopathy:    He has no cervical adenopathy.  Neurological: He is alert and oriented to person, place, and time. No cranial nerve deficit. Coordination normal.  Skin: Skin is warm and dry. No rash noted. He is not diaphoretic. No erythema. No pallor.  Psychiatric: He has a normal mood and affect. His behavior is normal. Judgment and thought content normal.          Assessment & Plan:   Problem List Items Addressed This Visit     Unprioritized   Abdominal pain, right upper quadrant - Primary     Symptoms most consistent with cholecystitis. Will get RUQ [place] and check CMP and CBC with labs.    Relevant Orders      Comp Met (CMET)      CBC w/Diff   Nausea with vomiting     Symptoms are most consistent with cholecystitis. Will get RUQ [place] and check CMP and CBC with labs. Ondansetron prn.    Relevant Medications  ondansetron (ZOFRAN-ODT) disintegrating tablet   Other Relevant Orders      US Abdomen Limited RUQ   Right ankle pain     Right ankle pain after twisting injury. Exam normal today, however having persistent lateral malleolar pain with ambulation. Will get plain xray for evaluation.    Relevant Orders      DG Ankle Complete Right       Return in about 1 week (around 01/07/2014) for Recheck.

## 2013-12-31 NOTE — Addendum Note (Signed)
Addended by: Ronette Deter A on: 12/31/2013 10:48 AM   Modules accepted: Orders

## 2013-12-31 NOTE — Assessment & Plan Note (Signed)
Right ankle pain after twisting injury. Exam normal today, however having persistent lateral malleolar pain with ambulation. Will get plain xray for evaluation.

## 2013-12-31 NOTE — Patient Instructions (Addendum)
Labs today.  Ultrasound of your abdomen today. Xray of ankle today.  Follow up in 1 week.

## 2013-12-31 NOTE — Assessment & Plan Note (Addendum)
Symptoms are most consistent with cholecystitis. Will get RUQ [place] and check CMP and CBC with labs. Ondansetron prn.

## 2013-12-31 NOTE — Assessment & Plan Note (Signed)
Symptoms most consistent with cholecystitis. Will get RUQ [place] and check CMP and CBC with labs.

## 2014-01-01 ENCOUNTER — Encounter: Payer: Self-pay | Admitting: *Deleted

## 2014-01-07 ENCOUNTER — Telehealth: Payer: Self-pay

## 2014-01-07 NOTE — Telephone Encounter (Signed)
His appointment is next week, and the [place] results showed only a polyp in the gallbladder, no urgent or emergent findings. I think next week is fine.

## 2014-01-07 NOTE — Telephone Encounter (Signed)
The patient's girlfriend called and is hoping to move the apt with Dr.Byrnett's office to a sooner date.  I called Dr.Byrnett's office, and they stated Dr.Walker has to call Dr.Byrnett and personally request a sooner apt.

## 2014-01-08 ENCOUNTER — Telehealth: Payer: Self-pay | Admitting: Internal Medicine

## 2014-01-08 NOTE — Telephone Encounter (Signed)
Notified pt. Pt states that he will go to the ED within the next 30-45 min

## 2014-01-08 NOTE — Telephone Encounter (Signed)
Patient Information:  Caller Name: Earnest Bailey  Phone: [phone]  Patient: Sergio, Garcia  Gender: Male  DOB: 02/13/1976  Age: 38 Years  PCP: Ronette Deter (Adults only)  Office Follow Up:  Does the office need to follow up with this patient?: Yes  Instructions For The Office: Please call Earnest Bailey back at initial call bakc number, [phone] and advise.   Symptoms  Reason For Call & Symptoms: Girlfriend, Earnest Bailey is calling requesting Dr. Gilford Rile send a message to Dr. Dwyane Luo office for earlier appt. than next week, as pt is "miserable, waking out of a dead sleep, doubled over in pain, keeping nothing down (even with phenergan on board) and only urinating 1 x daily. Earnest Bailey says pt refused to go to ED for dehydration sx, b/c last time he went, they did nothing for him. She states she is an Therapist, sports and "has trouble finding a vein, herself".  Called pt to triage [phone].--pt did not answer; lm on his vm. Dr. Festus Aloe # is [phone].  Reviewed Health History In EMR: Yes  Reviewed Medications In EMR: Yes  Reviewed Allergies In EMR: Yes  Reviewed Surgeries / Procedures: Yes  Date of Onset of Symptoms: 01/08/2014  Treatments Tried: Phenergan  Treatments Tried Worked: No  Guideline(s) Used:  No Protocol Available - Sick Adult  Disposition Per Guideline:   Callback by PCP or Subspecialist within 1 Hour  Reason For Disposition Reached:   Nursing judgment  Advice Given:  N/A  Patient Refused Recommendation:  Patient Refused Appt, Patient Requests Appt At Later Date  wants appt asap with Dr. Festus Aloe office if they can get it. Dr. Braulio Conte office says they'll fit pt in if Dr. Thomes Dinning office will call and request it.

## 2014-01-08 NOTE — Telephone Encounter (Signed)
Please advise 

## 2014-01-08 NOTE — Telephone Encounter (Signed)
If he is so nauseous that he is "keeping nothing down" even with phenergan, then he needs to be seen urgently in the ED for evaluation and IVF. The ED physician can consult surgery.

## 2014-01-10 ENCOUNTER — Ambulatory Visit: Payer: Self-pay | Admitting: Internal Medicine

## 2014-01-10 DIAGNOSIS — Z0289 Encounter for other administrative examinations: Secondary | ICD-10-CM

## 2014-01-12 ENCOUNTER — Inpatient Hospital Stay: Payer: Self-pay | Admitting: Internal Medicine

## 2014-01-12 LAB — COMPREHENSIVE METABOLIC PANEL
ALT: 16 U/L
AST: 20 U/L (ref 15–37)
Albumin: 3.7 g/dL (ref 3.4–5.0)
Alkaline Phosphatase: 51 U/L
Anion Gap: 6 — ABNORMAL LOW (ref 7–16)
BILIRUBIN TOTAL: 2.7 mg/dL — AB (ref 0.2–1.0)
BUN: 10 mg/dL (ref 7–18)
CALCIUM: 8.8 mg/dL (ref 8.5–10.1)
CHLORIDE: 105 mmol/L (ref 98–107)
Co2: 28 mmol/L (ref 21–32)
Creatinine: 1.14 mg/dL (ref 0.60–1.30)
Glucose: 178 mg/dL — ABNORMAL HIGH (ref 65–99)
OSMOLALITY: 281 (ref 275–301)
Potassium: 3.9 mmol/L (ref 3.5–5.1)
Sodium: 139 mmol/L (ref 136–145)
Total Protein: 7 g/dL (ref 6.4–8.2)

## 2014-01-12 LAB — CBC WITH DIFFERENTIAL/PLATELET
Basophil #: 0 10*3/uL (ref 0.0–0.1)
Basophil %: 0.7 %
Eosinophil #: 0 10*3/uL (ref 0.0–0.7)
Eosinophil %: 0.8 %
HCT: 46.3 % (ref 40.0–52.0)
HGB: 15.1 g/dL (ref 13.0–18.0)
LYMPHS ABS: 1.5 10*3/uL (ref 1.0–3.6)
LYMPHS PCT: 25.6 %
MCH: 29.7 pg (ref 26.0–34.0)
MCHC: 32.6 g/dL (ref 32.0–36.0)
MCV: 91 fL (ref 80–100)
MONOS PCT: 7 %
Monocyte #: 0.4 x10 3/mm (ref 0.2–1.0)
NEUTROS PCT: 65.9 %
Neutrophil #: 3.8 10*3/uL (ref 1.4–6.5)
PLATELETS: 240 10*3/uL (ref 150–440)
RBC: 5.09 10*6/uL (ref 4.40–5.90)
RDW: 13.5 % (ref 11.5–14.5)
WBC: 5.8 10*3/uL (ref 3.8–10.6)

## 2014-01-12 LAB — LIPASE, BLOOD: Lipase: 83 U/L (ref 73–393)

## 2014-01-12 LAB — HEMOGLOBIN A1C: Hemoglobin A1C: 6.3 % (ref 4.2–6.3)

## 2014-01-12 LAB — BILIRUBIN, DIRECT: BILIRUBIN DIRECT: 0.3 mg/dL — AB (ref 0.00–0.20)

## 2014-01-13 ENCOUNTER — Ambulatory Visit: Payer: Self-pay | Admitting: General Surgery

## 2014-01-13 LAB — COMPREHENSIVE METABOLIC PANEL
Albumin: 3.2 g/dL — ABNORMAL LOW (ref 3.4–5.0)
Alkaline Phosphatase: 44 U/L — ABNORMAL LOW
Anion Gap: 4 — ABNORMAL LOW (ref 7–16)
BUN: 10 mg/dL (ref 7–18)
Bilirubin,Total: 2.2 mg/dL — ABNORMAL HIGH (ref 0.2–1.0)
Calcium, Total: 8.3 mg/dL — ABNORMAL LOW (ref 8.5–10.1)
Chloride: 107 mmol/L (ref 98–107)
Co2: 29 mmol/L (ref 21–32)
Creatinine: 1.02 mg/dL (ref 0.60–1.30)
EGFR (African American): >60
EGFR (Non-African Amer.): >60
Glucose: 134 mg/dL — ABNORMAL HIGH (ref 65–99)
Osmolality: 280 (ref 275–301)
Potassium: 4.4 mmol/L (ref 3.5–5.1)
SGOT(AST): 17 U/L (ref 15–37)
SGPT (ALT): 12 U/L — ABNORMAL LOW
Sodium: 140 mmol/L (ref 136–145)
Total Protein: 6.1 g/dL — ABNORMAL LOW (ref 6.4–8.2)

## 2014-01-13 LAB — CBC WITH DIFFERENTIAL/PLATELET
Basophil #: 0 10*3/uL (ref 0.0–0.1)
Basophil %: 0.1 %
Eosinophil #: 0 10*3/uL (ref 0.0–0.7)
Eosinophil %: 0.2 %
HCT: 40.6 % (ref 40.0–52.0)
HGB: 13.3 g/dL (ref 13.0–18.0)
Lymphocyte #: 1 10*3/uL (ref 1.0–3.6)
Lymphocyte %: 12.2 %
MCH: 29.9 pg (ref 26.0–34.0)
MCHC: 32.7 g/dL (ref 32.0–36.0)
MCV: 92 fL (ref 80–100)
Monocyte #: 0.4 x10 3/mm (ref 0.2–1.0)
Monocyte %: 4.8 %
Neutrophil #: 6.5 10*3/uL (ref 1.4–6.5)
Neutrophil %: 82.7 %
Platelet: 216 10*3/uL (ref 150–440)
RBC: 4.43 10*6/uL (ref 4.40–5.90)
RDW: 13.4 % (ref 11.5–14.5)
WBC: 7.8 10*3/uL (ref 3.8–10.6)

## 2014-01-13 LAB — URINALYSIS, COMPLETE
Bacteria: NONE SEEN
Bilirubin,UR: NEGATIVE
Blood: NEGATIVE
Glucose,UR: NEGATIVE mg/dL (ref 0–75)
Ketone: NEGATIVE
Leukocyte Esterase: NEGATIVE
Nitrite: NEGATIVE
Ph: 6 (ref 4.5–8.0)
Protein: NEGATIVE
RBC,UR: 1 /HPF (ref 0–5)
Specific Gravity: 1.012 (ref 1.003–1.030)
Squamous Epithelial: NONE SEEN
WBC UR: NONE SEEN /HPF (ref 0–5)

## 2014-01-14 HISTORY — PX: ESOPHAGOGASTRODUODENOSCOPY: SHX1529

## 2014-01-17 ENCOUNTER — Ambulatory Visit: Payer: Self-pay | Admitting: Surgery

## 2014-01-17 LAB — PATHOLOGY REPORT

## 2014-01-21 ENCOUNTER — Encounter: Payer: Self-pay | Admitting: General Surgery

## 2014-01-21 ENCOUNTER — Ambulatory Visit (INDEPENDENT_AMBULATORY_CARE_PROVIDER_SITE_OTHER): Payer: BC Managed Care – PPO | Admitting: General Surgery

## 2014-01-21 VITALS — BP 120/72 | HR 76 | Resp 12 | Ht 74.0 in | Wt 191.0 lb

## 2014-01-21 DIAGNOSIS — F411 Generalized anxiety disorder: Secondary | ICD-10-CM

## 2014-01-21 DIAGNOSIS — R1011 Right upper quadrant pain: Secondary | ICD-10-CM

## 2014-01-21 DIAGNOSIS — K824 Cholesterolosis of gallbladder: Secondary | ICD-10-CM

## 2014-01-21 MED ORDER — DICYCLOMINE HCL 20 MG PO TABS: 20.0000 mg | ORAL_TABLET | Freq: Three times a day (TID) | ORAL | Status: DC

## 2014-01-21 NOTE — Progress Notes (Addendum)
Patient ID: KYLOR VALVERDE, male   DOB: Feb 15, 1976, 38 y.o.   MRN: [id number]  Chief Complaint  Patient presents with  . Abdominal Pain    HPI Sergio Garcia is a 38 y.o. male.  Here today for evaluation of recurrent abdominal pain.  States he has a polyp in his gall bladder. HIDA scan was 01-17-14. States he has had chronic abdominal pain on/off for about a month or two, This after several months of being asymptomatic. Pain is now in the upper right abdomen and is sharp and constant. States he was in the hospital August 23-25. Having trouble nausea and vomiting with all meals. Weight loss of 15 pounds over one month. During his hospitalization an upper endoscopy was completed by [name], M.D.  Localize, moderate erythema was identified in the gastric antrum. Biopsies were unremarkable with no evidence of active inflammation and no evidence of H. Pylori infection. The patient was reevaluated by Dr. Pat Patrick on January 12, 2014. Except the identification of new findings of gallbladder polyp he did not find indication for surgical intervention.At the time of discharge on January 14, 2014 he was to receive an anti-emetics, PPI as well as clonazepam.  The patient's girlfriend, Sergio Garcia, was present for today's exam. She works at [employer]. She feels his right side of his abdomen is more swollen than the left.  He seems to tolerate blue Gatorade the best. Stools described as "green water".  He had went to the ER in January 2015 and was diagnosed with gastritis and was seen by Dr. Donnella Sham and Dr. Burt Knack. The patient was admitted by Bronson Ing, M.D. In January 2015.  During that admission, a CT scan date May 24, 2013 showed distention of the distal small bowel with a suggestion of bowel wall thickening involving a loop of distal jejunum. The terminal ileum appeared decompressed with a transition point in the right lower quadrant. Etiology not identified. Colon was decompressed. The possibility of Crohn's  disease or regional infection were to be considered. Abdominal ultrasound of the same date was unremarkable. Plain films completed the following day again suggested partial small bowel obstruction. This pattern persisted over the next several days, with normals dated May 27, 2013 showing less small bowel dilatation. Small bowel series dated May 29, 2013 showed a normal transit time of 2 hours and 30 minutes. No evidence of bowel dilatation or obstruction. No evidence of mass or stricture. No wall thickening evident.Diagnostic laparoscopy dated May 30, 2013 completed by Phoebe Perch, M.D. Described diffuse small bowel erythema and friability without overt thickening. This was noted most prominently in the midportion of the small bowel, with the terminal ileum uninvolved. Minimal "creeping fat" identified. The area involved was described as being extensive, showing evidence of hyper peristaltic waves and erythematous. The visualized portion of the appendix was unremarkable. No Meckel's diverticulum was identified. The patient was placed on steroids as well as treated with Cipro and Flagyl. At the time of discharge she was showing some improvement, and by his report he was doing well through spring of 2015.Marland Kitchen Shortly after that hospitalization, blood testing for IBD Was reported low probability for inflammatory bowel disease.  During his hospitalization he was evaluated by Loistine Simas, M.D. From the GI department. He had been evaluated through GI dating back to 2006 with upper abdominal pain, with a negative colonoscopy at that time including intubation of the terminal ileum. Clinical impression from GI consultation note was the possibility for Crohn's disease/regional enteritis.  Beginning in February 2015 he transferred to Lucio Edward, MD in [place] for GI follow up. Review of the February and April 2015 nodes suggested an infectious enteritis that had resolved as well as a history compatible  with IBS to be treated as needed with glycopyrrolate.  HPI  Past Medical History  Diagnosis Date  . Depression   . Anxiety   . GERD (gastroesophageal reflux disease)   . Insomnia   . Gastritis   . Gilbert's syndrome     hyperbilirubinemia  . Prostatitis 2008  . Degenerative disc disease   . IBS (irritable bowel syndrome)   . Enteritis   . Inguinal hernia     Past Surgical History  Procedure Laterality Date  . Esophagogastroduodenoscopy  09/2004    gastritis acute and duodenitis  . Back surgery      2 ruptured discs  . Exploratory laparotomy  Jan 2015    Dr Burt Knack  . Esophagogastroduodenoscopy  01-14-14    Dr Allen Norris    Family History  Problem Relation Age of Onset  . Colon polyps Mother   . Heart disease Father   . Heart attack Father     x 2  . Pancreatic cancer Maternal Grandfather     Social History History  Substance Use Topics  . Smoking status: Never Smoker   . Smokeless tobacco: Never Used  . Alcohol Use: No    Allergies  Allergen Reactions  . Morphine And Related Anaphylaxis  . Morphine Nausea And Vomiting  . Tdap [Diphth-Acell Pertussis-Tetanus] Hives and Rash    Current Outpatient Prescriptions  Medication Sig Dispense Refill  . clonazePAM (KLONOPIN) 0.5 MG tablet Take 1 tablet (0.5 mg total) by mouth 3 (three) times daily as needed for anxiety.  90 tablet  1  . glycopyrrolate (ROBINUL) 1 MG tablet Take 1 mg by mouth 2 (two) times daily as needed.      Marland Kitchen HYDROcodone-acetaminophen (NORCO/VICODIN) 5-325 MG per tablet       . ibuprofen (ADVIL,MOTRIN) 800 MG tablet       . ondansetron (ZOFRAN-ODT) 8 MG disintegrating tablet Take 1 tablet (8 mg total) by mouth every 8 (eight) hours as needed for nausea or vomiting.  60 tablet  0  . pantoprazole (PROTONIX) 40 MG tablet       . dicyclomine (BENTYL) 20 MG tablet Take 1 tablet (20 mg total) by mouth 4 (four) times daily -  before meals and at bedtime.  60 tablet  0   No current facility-administered  medications for this visit.    Review of Systems Review of Systems  Constitutional: Negative.   Respiratory: Negative.   Cardiovascular: Negative.   Gastrointestinal: Positive for nausea, vomiting and abdominal pain. Negative for diarrhea.    Blood pressure 120/72, pulse 76, resp. rate 12, height 6\' 2"  (1.88 m), weight 191 lb (86.637 kg).  Physical Exam Physical Exam  Constitutional: He is oriented to person, place, and time. He appears well-developed and well-nourished.  Eyes: No scleral icterus.  Neck: Neck supple.  Cardiovascular: Normal rate, regular rhythm and normal heart sounds.   Pulmonary/Chest: Effort normal and breath sounds normal.  Abdominal: Soft. Normal appearance and bowel sounds are normal. There is tenderness in the right upper quadrant.  Lymphadenopathy:    He has no cervical adenopathy.  Neurological: He is alert and oriented to person, place, and time.  Skin: Skin is warm and dry.    Data Reviewed Recently completed his abdominal ultrasound showed evidence of a 6.5  mm polyp in the fundus of the gallbladder, confirmed on multiple studies are both August 7 of January 10, 2014. Recent HIDA scan showed an ejection fraction of 99% with reproduction/exacerbation of his right upper quadrant pain.  Assessment    Abdominal pain, recurrent.  Gallbladder polyp, recently diagnosed inflammatory versus adenomatous.  Previous laparoscopies suggesting serosal inflammation without mucosal abnormalities on barium contrast study.  Gilbert's syndrome.   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    Plan    The patient's abdominal pain is difficult to explain based on the identification of a gallbladder polyp, especially one that has developed just in the past 8 months.Serum transaminases were modestly elevated during his January 2015 exam, but entirely normal at the time of his arteries admission. Bilirubin at the time of the January 12, 2014 exam was 2.3 with a direct component  of 0.3. This is a little higher than normally seen with simple Gilbert's syndrome. Alkaline phosphatase was normal.  Elective cholecystectomy  could be considered based on the identification of a polyp greater than 6 mm. I am not convinced that this has anything to do with his present abdominal pain. Diagnostic imaging today has been unremarkable, with his recent EGD not showing evidence of ulcer disease or inflammation on biopsy, nor evidence of H. Pylori infection. A repeat small bowel follow-through does not felt reasonable considering the discordance between a normal small bowel follow-through and inflammatory changes noted at the time of laparoscopy the following day.  There is no clear data to suggest patient's with high ejection fractions should be considered candidate for cholecystectomy, although his reproduction/exacerbation of symptoms during the CCK infusion could suggest a degree of inappropriate biliary contraction. A trial of Bentyl, 20 mg q.i.d. Has been recommended pending reassessment by his GI physician.  Diagnostic laparoscopy with probable cholecystectomy/cholangiogram can be considered if no other etiology for his abdominal symptoms can be identified. Liver biopsy could be completed at the same time if the GI service thought that this would be of benefit.  I have spoken with the patient's primary care provider, who reported that the patient does have a high anxiety level and is going through a tumultuous divorced. Efforts are in place to obtain early reassessment by Dr. Fuller Plan.       PCP/Ref: Ronette Deter  Dr. Particia Lather, [place] Gleason 01/23/2014, 6:43 PM

## 2014-01-21 NOTE — Patient Instructions (Addendum)
The patient is aware to call back for any questions or concerns.  

## 2014-01-22 ENCOUNTER — Telehealth: Payer: Self-pay | Admitting: *Deleted

## 2014-01-22 NOTE — Telephone Encounter (Signed)
Pts girfriend called stating that the patient is still in pain and today is actually getting worse. The Medicine Dicyclomine is not helping, he wants to proceed with surgery.

## 2014-01-23 ENCOUNTER — Telehealth: Payer: Self-pay | Admitting: Gastroenterology

## 2014-01-23 ENCOUNTER — Telehealth: Payer: Self-pay | Admitting: Internal Medicine

## 2014-01-23 DIAGNOSIS — F329 Major depressive disorder, single episode, unspecified: Secondary | ICD-10-CM | POA: Insufficient documentation

## 2014-01-23 DIAGNOSIS — F419 Anxiety disorder, unspecified: Secondary | ICD-10-CM | POA: Insufficient documentation

## 2014-01-23 DIAGNOSIS — K824 Cholesterolosis of gallbladder: Secondary | ICD-10-CM | POA: Insufficient documentation

## 2014-01-23 NOTE — Telephone Encounter (Signed)
Patient with weight loss, green watery stools and 20 lbs. Weight loss over the last 2 months.  Dr. Fleet Contras was going to remove gallbladder due to a polyp only, but wants [place] to evaluate the patient prior to surgery for other GI causes.  He will come in tomorrow at 3:00 with Alonza Bogus, PA

## 2014-01-23 NOTE — Telephone Encounter (Signed)
Message copied by Carson Myrtle on Thu Jan 23, 2014  4:37 PM ------      Message from: Augustin Schooling      Created: Thu Jan 23, 2014  2:23 PM      Contact: [phone]       Estill Bamberg from [facility] (Dr.Malcom Fuller Plan) called and stated that the above patient was referred to them by [place] and she was wanting to know why, there was noting in the notes stating why he needs to be seen there.  ------

## 2014-01-23 NOTE — Telephone Encounter (Signed)
I talked with Sergio Garcia and she is aware of s/s. Notes to be faxed soon.

## 2014-01-23 NOTE — Telephone Encounter (Signed)
Pt states that he has not scheduled that appt yet, he states that a specialty appt is $81 copay and he does not have the money at this time. Pt does state that he does not want to go another month hurting like he has been, so he is working on getting some money together for the appt.

## 2014-01-23 NOTE — Telephone Encounter (Signed)
Dr. Bary Castilla wants to be thorough and make sure that he is evaluated again by Dr. Fuller Plan prior to any surgery. I am sure they will work with him on cost of his visit.

## 2014-01-23 NOTE — Telephone Encounter (Signed)
Can you please call pt and let him know that I spoke with Dr. Bary Castilla. He needs to follow up with Dr. Fuller Plan prior to any surgery. Has he scheduled that appointment?

## 2014-01-24 ENCOUNTER — Encounter: Payer: Self-pay | Admitting: Gastroenterology

## 2014-01-24 ENCOUNTER — Ambulatory Visit (INDEPENDENT_AMBULATORY_CARE_PROVIDER_SITE_OTHER): Payer: BC Managed Care – PPO | Admitting: Gastroenterology

## 2014-01-24 VITALS — BP 112/62 | HR 68 | Ht 72.25 in | Wt 187.1 lb

## 2014-01-24 DIAGNOSIS — R1011 Right upper quadrant pain: Secondary | ICD-10-CM

## 2014-01-24 DIAGNOSIS — R112 Nausea with vomiting, unspecified: Secondary | ICD-10-CM

## 2014-01-24 NOTE — Progress Notes (Signed)
01/24/2014 Sergio Garcia [id number] May 10, 1976   History of Present Illness:  This is a 38 year old male who is previously known to Dr. Fuller Plan for complaints of abdominal pain and elevated bilirubin in the past. He presents to our office today with his grandmother at the request of a surgeon, Dr. Bary Castilla, for evaluation and "clearance" before having his gallbladder removed.  Apparently they want to be sure that we do not think there is any other evaluation needed or other cause of his complaints prior to having a cholecystectomy. He has complaints of right upper quadrant abdominal pain with nausea and vomiting. After reviewing his records, he has had extensive evaluation of his abdomen for complaints of abdominal pain, particularly in the right upper quadrant dating back to at least 2009. He tells me that he had right quadrant abdominal pain back in January and had some evaluation, but the pain subsided some but never completely resolved. Then again about a month and a half ago the pain returned and he has nausea and vomiting with it as well. He has not been able to keep anything down except for Gatorade. He is having some diarrhea also, but once again not eating any solid food according to his report.  The pain is worsened by eating. He has an elevated bilirubin, which has been evaluated extensively with MRCP in the past and with liver serologies, which is all been unremarkable. He was given a diagnosis of Gilbert's disease. His bilirubin recently was 4.2, but direct bilirubin is only 0.3. Remainder of LFTs are normal. He was recently hospitalized in August at [place] center for these symptoms and abdominal ultrasound showed no acute findings, but revealed a gallbladder polyp that was 7 x 5 x 7 mm. Common bile duct is 2.7 mm. He underwent an EGD which revealed only gastritis. Pathology was consistent with minimal reactive gastropathy. He had a HIDA scan in April, which was normal and he  says that he had another HIDA scan repeated in August, however, I do not have those results; he says that the repeat study was normal as well.  When he had similar pain back in January of this year he underwent CT scan of the abdomen and pelvis with contrast, which was suspicious for small bowel obstruction.  He then subsequently underwent an an esophagram and small bowel series, which was unremarkable.  IBD serologies have been performed, which were negative.  Celiac labs have been negative.  He has been taking Bentyl 20 mg four times daily for the past several days and has not noticed any improvement in his symptoms.  He is on protonix as well.  He would really like to have his gallbladder removed and see if his symptoms get better because he is tired of being sick.   Current Medications, Allergies, Past Medical History, Past Surgical History, Family History and Social History were reviewed in [employer] record.   Physical Exam: BP 112/62  Pulse 68  Ht 6' 0.25" (1.835 m)  Wt 187 lb 2 oz (84.879 kg)  BMI 25.21 kg/m2 General: Well developed white male in no acute distress Head: Normocephalic and atraumatic Eyes:  Sclerae anicteric, conjunctiva pink  Ears: Normal auditory acuity Lungs: Clear throughout to auscultation Heart: Regular rate and rhythm Abdomen: Soft, non-distended.  Normal bowel sounds.  RUQ and epigastric TTP without R/R/G. Musculoskeletal: Symmetrical with no gross deformities  Extremities: No edema  Neurological: Alert oriented x 4, grossly non-focal Psychological:  Alert  and cooperative. Normal mood and affect  Assessment and Recommendations: -RUQ abdominal pain with nausea/vomiting and diarrhea:  This abdominal pain has been present for several years at least dating back to 2009 when he had complaints of right upper quadrant abdominal pain. The nausea and vomiting is new, however. He has undergone extensive evaluation of his abdomen without any  other definite diagnoses or other problems. In my opinion, I think that he should undergo cholecystectomy to see if any of his symptoms improve/resolve.  *More than 30 minutes were spent reviewing records and spending face to face time with the patient.

## 2014-01-24 NOTE — Patient Instructions (Signed)
Will send office note to Dr. Bary Castilla

## 2014-01-25 NOTE — Progress Notes (Signed)
Reviewed and agree with management plan. Cholecystectomy is a reasonable option given his persistent symptoms and his extensive evaluation that has not established a diagnosis however it may not resolve his problems. He is in favor of cholecystectomy since it might help. Cholecystectomy will definitively address the gallbladder polyp.  Pricilla Riffle. Fuller Plan, MD [facility]

## 2014-01-28 ENCOUNTER — Telehealth: Payer: Self-pay

## 2014-01-28 ENCOUNTER — Other Ambulatory Visit: Payer: Self-pay | Admitting: General Surgery

## 2014-01-28 DIAGNOSIS — K824 Cholesterolosis of gallbladder: Secondary | ICD-10-CM

## 2014-01-28 NOTE — Telephone Encounter (Signed)
Spoke with patient about scheduling his surgery for gallbladder removal. Patient is scheduled for surgery at [facility] on 01/31/14. He will pre admit by phone on 01/29/14. Patient is aware of dates and instructions.

## 2014-01-31 ENCOUNTER — Ambulatory Visit: Payer: Self-pay | Admitting: General Surgery

## 2014-01-31 ENCOUNTER — Encounter: Payer: Self-pay | Admitting: General Surgery

## 2014-01-31 DIAGNOSIS — K811 Chronic cholecystitis: Secondary | ICD-10-CM

## 2014-01-31 HISTORY — PX: CHOLECYSTECTOMY: SHX55

## 2014-02-03 LAB — PATHOLOGY REPORT

## 2014-02-04 ENCOUNTER — Ambulatory Visit: Payer: Self-pay | Admitting: General Surgery

## 2014-02-04 ENCOUNTER — Encounter: Payer: Self-pay | Admitting: General Surgery

## 2014-02-04 ENCOUNTER — Ambulatory Visit: Payer: Self-pay | Admitting: Gastroenterology

## 2014-02-10 ENCOUNTER — Ambulatory Visit (INDEPENDENT_AMBULATORY_CARE_PROVIDER_SITE_OTHER): Payer: Self-pay | Admitting: General Surgery

## 2014-02-10 ENCOUNTER — Encounter: Payer: Self-pay | Admitting: General Surgery

## 2014-02-10 VITALS — BP 116/70 | HR 78 | Resp 12 | Ht 72.0 in | Wt 187.0 lb

## 2014-02-10 DIAGNOSIS — K811 Chronic cholecystitis: Secondary | ICD-10-CM

## 2014-02-10 NOTE — Patient Instructions (Addendum)
Proper lifting techniques reviewed.Patient to return to work on 02/17/14.

## 2014-02-10 NOTE — Progress Notes (Signed)
Patient ID: Sergio Garcia, male   DOB: Jan 24, 1976, 38 y.o.   MRN: [id number]  Chief Complaint  Patient presents with  . Routine Post Op    gallbladder    HPI Sergio Garcia is a 38 y.o. male. here today for his post op gallbladder surgery on 01/31/14. Patient states he is doing well. Patient report he is moving his bowels after every meal.  This is an ongoing problem over time attributed to his IBS.  The patient reports no bowel pain since postoperative day one. He has been able to resume a regular diet. HPI  Past Medical History  Diagnosis Date  . Depression   . Anxiety   . GERD (gastroesophageal reflux disease)   . Insomnia   . Gastritis   . Gilbert's syndrome     hyperbilirubinemia  . Prostatitis 2008  . Degenerative disc disease   . IBS (irritable bowel syndrome)   . Enteritis   . Inguinal hernia     Past Surgical History  Procedure Laterality Date  . Esophagogastroduodenoscopy  09/2004    gastritis acute and duodenitis  . Back surgery      2 ruptured discs  . Exploratory laparotomy  Jan 2015    Dr Burt Knack  . Esophagogastroduodenoscopy  01-14-14    Dr Allen Norris  . Cholecystectomy  01/31/14    Family History  Problem Relation Age of Onset  . Colon polyps Mother   . Heart disease Father   . Heart attack Father     x 2  . Pancreatic cancer Maternal Grandfather     Social History History  Substance Use Topics  . Smoking status: Never Smoker   . Smokeless tobacco: Never Used  . Alcohol Use: No    Allergies  Allergen Reactions  . Morphine And Related Anaphylaxis  . Morphine Nausea And Vomiting  . Tdap [Diphth-Acell Pertussis-Tetanus] Hives and Rash    Current Outpatient Prescriptions  Medication Sig Dispense Refill  . clonazePAM (KLONOPIN) 0.5 MG tablet Take 1 tablet (0.5 mg total) by mouth 3 (three) times daily as needed for anxiety.  90 tablet  1  . dicyclomine (BENTYL) 20 MG tablet Take 1 tablet (20 mg total) by mouth 4 (four) times daily -  before meals  and at bedtime.  60 tablet  0  . glycopyrrolate (ROBINUL) 1 MG tablet Take 1 mg by mouth 2 (two) times daily as needed.      Marland Kitchen HYDROcodone-acetaminophen (NORCO/VICODIN) 5-325 MG per tablet       . ibuprofen (ADVIL,MOTRIN) 800 MG tablet       . ondansetron (ZOFRAN-ODT) 8 MG disintegrating tablet Take 1 tablet (8 mg total) by mouth every 8 (eight) hours as needed for nausea or vomiting.  60 tablet  0  . pantoprazole (PROTONIX) 40 MG tablet        No current facility-administered medications for this visit.    Review of Systems Review of Systems  Constitutional: Negative.   Respiratory: Negative.   Cardiovascular: Negative.     Blood pressure 116/70, pulse 78, resp. rate 12, height 6' (1.829 m), weight 187 lb (84.823 kg).  Physical Exam Physical Exam  Constitutional: He appears well-developed and well-nourished.  Cardiovascular: Normal rate, regular rhythm and normal heart sounds.   Pulmonary/Chest: Effort normal and breath sounds normal.  Abdominal: Soft. Normal appearance and bowel sounds are normal. There is no tenderness.  Port sites look clean and healing well.   Neurological: He is alert.  Skin: Skin is warm.  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  The images from the January 2015 diagnostic laparoscopy with had been previously reviewed. No inflammatory process in the right upper quadrant was noted at that time. Extensive adhesions of the omentum to the undersurface of the gallbladder were noted the time of his recent cholecystectomy.  Assessment    Doing well status post cholecystectomy.     Plan    Will plan to have the patient return to work next week.Follow up here will be on an as needed basis.     PCP: Sonnie Alamo 02/11/2014, 6:50 AM

## 2014-02-11 DIAGNOSIS — K811 Chronic cholecystitis: Secondary | ICD-10-CM | POA: Insufficient documentation

## 2014-02-17 ENCOUNTER — Encounter: Payer: Self-pay | Admitting: General Surgery

## 2014-03-11 ENCOUNTER — Ambulatory Visit: Payer: Self-pay | Admitting: Family Medicine

## 2014-03-11 DIAGNOSIS — Z0289 Encounter for other administrative examinations: Secondary | ICD-10-CM

## 2014-03-18 ENCOUNTER — Encounter: Payer: Self-pay | Admitting: Family Medicine

## 2014-03-18 ENCOUNTER — Ambulatory Visit (INDEPENDENT_AMBULATORY_CARE_PROVIDER_SITE_OTHER): Payer: BC Managed Care – PPO | Admitting: Family Medicine

## 2014-03-18 VITALS — BP 104/70 | HR 82 | Temp 98.4°F | Ht 72.0 in | Wt 188.0 lb

## 2014-03-18 DIAGNOSIS — B9689 Other specified bacterial agents as the cause of diseases classified elsewhere: Secondary | ICD-10-CM | POA: Insufficient documentation

## 2014-03-18 DIAGNOSIS — J019 Acute sinusitis, unspecified: Secondary | ICD-10-CM

## 2014-03-18 MED ORDER — AMOXICILLIN-POT CLAVULANATE 875-125 MG PO TABS: 1.0000 | ORAL_TABLET | Freq: Two times a day (BID) | ORAL | Status: DC

## 2014-03-18 NOTE — Assessment & Plan Note (Signed)
2 weeks of symptoms  Frontal sinus tenderness Cover with augmentin  Disc symptomatic care - see instructions on AVS  Update if not starting to improve in a week or if worsening

## 2014-03-18 NOTE — Progress Notes (Signed)
Pre visit review using our clinic review tool, if applicable. No additional management support is needed unless otherwise documented below in the visit note. 

## 2014-03-18 NOTE — Patient Instructions (Signed)
I think you have a sinus infection  Drink lots of fluids Take the augmentin as directed  Try nasal saline spray or a netti pot over the counter to irrigate sinuses  Also breathe steam  Take mucinex DM for congestion and cough If needed  For headache -plain ibuprofen (advil/ motrin) is ok  Update if not starting to improve in a week or if worsening

## 2014-03-18 NOTE — Progress Notes (Signed)
Subjective:    Patient ID: Sergio Garcia, male    DOB: [birth year], 38 y.o.   MRN: [id number]  HPI Has been sick for a few weeks - cough / productive   Taking mucinex otc  Yellow mucous  Not smoking  No wheeze   Ears are sore  Throat is sore Very congested  Sinus pain in face - bad headache all day yesterday   Has also taken advil cold and sinus  Had fever several days ago    Patient Active Problem List   Diagnosis Date Noted  . Chronic cholecystitis 02/11/2014  . RUQ abdominal pain 01/24/2014  . Gall bladder polyp 01/23/2014  . Anxiety state, unspecified 01/23/2014  . Abdominal pain, right upper quadrant 12/31/2013  . Nausea with vomiting 12/31/2013  . Right ankle pain 12/31/2013  . Generalized anxiety disorder 11/01/2013  . Genetic counseling 11/01/2013  . Elevated LFTs 07/26/2013  . Enteritis 06/27/2013  . Adjustment disorder with mixed anxiety and depressed mood 04/05/2013  . Dyspnea 12/25/2012  . GERD (gastroesophageal reflux disease) 12/20/2012  . 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 GILBERT'S SYNDROME 09/28/2009  . IRRITABLE BOWEL SYNDROME 09/28/2009  . 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 ALLERGIC RHINITIS 08/30/2007   Past Medical History  Diagnosis Date  . Depression   . Anxiety   . GERD (gastroesophageal reflux disease)   . Insomnia   . Gastritis   . Gilbert's syndrome     hyperbilirubinemia  . Prostatitis 2008  . Degenerative disc disease   . IBS (irritable bowel syndrome)   . Enteritis   . Inguinal hernia    Past Surgical History  Procedure Laterality Date  . Esophagogastroduodenoscopy  09/2004    gastritis acute and duodenitis  . Back surgery      2 ruptured discs  . Exploratory laparotomy  Jan 2015    Dr Burt Knack  . Esophagogastroduodenoscopy  01-14-14    Dr Allen Norris  . Cholecystectomy  01/31/14   History  Substance Use Topics  . Smoking status: Never Smoker   . Smokeless tobacco: Never Used  . Alcohol Use:  No   Family History  Problem Relation Age of Onset  . Colon polyps Mother   . Heart disease Father   . Heart attack Father     x 2  . Pancreatic cancer Maternal Grandfather    Allergies  Allergen Reactions  . Morphine And Related Anaphylaxis  . Morphine Nausea And Vomiting  . Tdap [Diphth-Acell Pertussis-Tetanus] Hives and Rash   Current Outpatient Prescriptions on File Prior to Visit  Medication Sig Dispense Refill  . clonazePAM (KLONOPIN) 0.5 MG tablet Take 1 tablet (0.5 mg total) by mouth 3 (three) times daily as needed for anxiety.  90 tablet  1  . glycopyrrolate (ROBINUL) 1 MG tablet Take 1 mg by mouth 2 (two) times daily as needed.       No current facility-administered medications on file prior to visit.      Review of Systems    Review of Systems  Constitutional: Negative for fever, appetite change,  and unexpected weight change.  ENT pos for congestion/ rhinorrhea and facial pain  Eyes: Negative for pain and visual disturbance.  Respiratory: Negative for wheeze  and shortness of breath.   Cardiovascular: Negative for cp or palpitations    Gastrointestinal: Negative for nausea, diarrhea and constipation.  Genitourinary: Negative for urgency and frequency.  Skin: Negative for pallor or rash   Neurological: Negative for weakness, light-headedness, numbness  and headaches.  Hematological: Negative for adenopathy. Does not bruise/bleed easily.  Psychiatric/Behavioral: Negative for dysphoric mood. The patient is not nervous/anxious.      Objective:   Physical Exam  Constitutional: He appears well-developed and well-nourished. No distress.  HENT:  Head: Normocephalic and atraumatic.  Right Ear: External ear normal.  Left Ear: External ear normal.  Mouth/Throat: Oropharynx is clear and moist. No oropharyngeal exudate.  Nares are injected and congested  Bilateral maxillary and frontal sinus tenderness  Rhinorrhea   Eyes: Conjunctivae and EOM are normal. Pupils are  equal, round, and reactive to light. Right eye exhibits no discharge. Left eye exhibits no discharge.  Neck: Normal range of motion. Neck supple.  Cardiovascular: Normal rate, regular rhythm and normal heart sounds.   Pulmonary/Chest: Effort normal and breath sounds normal. No respiratory distress. He has no wheezes. He has no rales.  Lymphadenopathy:    He has no cervical adenopathy.  Neurological: He is alert.  Skin: Skin is warm and dry. No rash noted.  Psychiatric: He has a normal mood and affect.          Assessment & Plan:   Problem List Items Addressed This Visit      Respiratory   Acute bacterial sinusitis - Primary    2 weeks of symptoms  Frontal sinus tenderness Cover with augmentin  Disc symptomatic care - see instructions on AVS  Update if not starting to improve in a week or if worsening      Relevant Medications      amoxicillin-clavulanate (AUGMENTIN) tablet 875-125 mg

## 2014-03-20 ENCOUNTER — Encounter: Payer: Self-pay | Admitting: Internal Medicine

## 2014-03-20 NOTE — Telephone Encounter (Signed)
Please advise 

## 2014-03-25 ENCOUNTER — Ambulatory Visit: Payer: BC Managed Care – PPO | Admitting: Internal Medicine

## 2014-03-25 ENCOUNTER — Telehealth: Payer: Self-pay | Admitting: Internal Medicine

## 2014-03-25 DIAGNOSIS — Z0289 Encounter for other administrative examinations: Secondary | ICD-10-CM

## 2014-03-25 NOTE — Telephone Encounter (Signed)
He was nearly 11min late to his appointment. In effort to provide the best care for all of our patients, we ask that patients come to their visits on time. I don't know about him being "turned away" in the past, however I see that he no-showed to a visit with Dr. Glori Bickers. I do not have any other availability today. We can schedule him in the next 40min slot.  Izora Gala - Can you please talk with him? We cannot tolerate patients cursing at the staff.

## 2014-03-25 NOTE — Telephone Encounter (Signed)
Pt came in for 1pm appt at 1:29. Pt stated that his message said that appt was at 1:30. Pt asked if he could still be seen today. Pt was informed again that his appt was at 1 and if you are more than 54mins late for an appt then he would have to reschedule the appt. Pt was given the option to come for an appt on 11/4 at 0730. Pt stated that he can't take off from work two days straight. Pt stated "just forget it, fuck it" and walked out of the office.msn

## 2014-03-25 NOTE — Telephone Encounter (Signed)
Please see below.

## 2014-03-25 NOTE — Telephone Encounter (Signed)
Pt's girlfriend called, stating we have "turned him away twice". She states he "needs help and won't get the help" because we keep turning him away. States his appointment was at 1:00. Wants him to be seen. Concerned about his depression. FYI

## 2014-03-26 ENCOUNTER — Encounter: Payer: Self-pay | Admitting: Internal Medicine

## 2014-09-13 NOTE — Consult Note (Signed)
PATIENT NAME:  Sergio Garcia, Sergio Garcia MR#:  [id number] DATE OF BIRTH:  08-09-1975  DATE OF CONSULTATION:  05/25/2013  REFERRING PHYSICIAN:  Elta Guadeloupe A. Marina Gravel, MD CONSULTING PHYSICIAN:  Lollie Sails, MD  REASON FOR CONSULTATION: Abdominal pain, nausea, vomiting, possible Crohn's disease.  HISTORY OF PRESENT ILLNESS: Mr. Lunney is a 39 year old Caucasian male who was in his usual state of health until this past Tuesday. He stated at that time he began to have some nausea and lower abdominal pain. Then 2 nights ago, the right lower quadrant abdominal pain got much worse. It was stabbing in nature. His abdomen had become more bloated. He was having diarrhea, nausea and vomiting. Currently, he states that his abdominal pain is a 4 out of 10, more so still related to the right lower quadrant, but across the lower abdomen. He states that the pain will go up to about a 6 or 7 when he needs pain medications. Stools have been watery. There are no black stools or blood in the stool. They have been multiple per day. In review, the patient states that he has had intermittent similar symptoms for many years, maybe happening up to 20 or 30 times a year, but not nearly as bad as what this episode has been. His girlfriend had the influenza B about 1-1/2 weeks ago. He has been told that he had irritable bowel syndrome in the past.   PAST MEDICAL HISTORY:  Pertinent for treatment for anxiety and depression. He recently began Lexapro and Klonopin. He has a history of a back surgery. In review, he had a colonoscopy on 02/11/2005 due to right upper quadrant pain. The colonoscopy was normal, and biopsies of the terminal ileum and right and left colon were normal as well. He had an EGD on 02/03/2005 for the right upper quadrant pain, this showing some duodenitis; however, biopsies again were normal. He denies any rectal bleeding with this episode.   SOCIAL HISTORY: He is a nonsmoker. He rarely drinks alcohol.   GASTROINTESTINAL  FAMILY HISTORY: Pertinent for a grandfather with colon cancer. There is no family history of liver disease. No family history of ulcers or inflammatory bowel disease such as ulcerative colitis or Crohn's disease.   PHYSICAL EXAMINATION:  VITAL SIGNS: Temperature is 97.5, pulse 51, respirations 19, blood pressure 109/67, pulse oximetry 98%.  GENERAL: He is a 39 year old Caucasian male in no acute distress. Complexion is quite sallow. He is in no distress.  HEENT: Normocephalic, atraumatic. Eyes are anicteric. Nose: Septum midline. No lesions. Oropharynx: No lesions.  NECK: Supple. No JVD. No lymphadenopathy. No thyromegaly.  HEART: Regular rate and rhythm.  LUNGS: Clear.  ABDOMEN: Soft. He is markedly tender to palpation in the right lower quadrant extending across the midline into the left lower quadrant, but much less so. The upper abdomen is nontender, including the right upper quadrant. Bowel sounds are positive, but decreased. There is minimal distention.  EXTREMITIES: No clubbing, cyanosis or edema.  NEUROLOGICAL: Cranial nerves II through XII grossly intact. Muscle strength bilaterally equal and symmetric.   LABORATORY DATA: From this morning, include the following: He has a glucose of 118, BUN 16, creatinine 1.18, sodium 135, potassium 4.0, chloride 104, bicarbonate 29, calcium 8.6. He had a lipase on admission of 90. His hepatic profile yesterday morning showed a total protein of 7.7, albumin 3.9, total bilirubin 1.5. This was not fractionated. Alkaline phosphatase 61, AST 267, ALT 211. Hemogram from yesterday showed a white count of 9.9, hemoglobin and hematocrit 17.0/50.9,  platelet count 262. Today's hemogram shows a white count of 4.5, H and H 14.1/41.9, platelet count of 210. He has been checked for C. difficile, which was negative. There have been no other stool studies. He is currently on both cefoxitin as well as clindamycin. Urinalysis on admission to the hospital showed 30 mg/dL  protein, negative nitrite, negative leukocyte esterase.   IMAGING: He had an abdominal ultrasound showing no focal lesion in the liver. His common bile duct was normal caliber at 2 mm. There was no evidence of gallstones, no sonographic Murphy sign, relatively unremarkable right upper quadrant ultrasound. He had a CT scan of the abdomen and pelvis with contrast, this showing a moderate distention of the distal small bowel, apparent bowel wall thickening involving a loop of the small bowel, likely distal jejunum. The terminal ileum appeared decompressed, and there was an apparent transition point within the right lower quadrant. The colon was relatively decompressed. Etiology for an apparent transition point was not identified. Impression on the scan was overall findings worrisome for small bowel obstruction with apparent transition point involving the region of the terminal ileum or right lower quadrant. There was some geographic wall thickening of an isolated loop of small bowel, presumed distal jejunum, within the midline of the lower abdomen/upper pelvis, nonspecific. No definable or drainable intra-abdominal fluid collection. Normal appearance of a retrocecal appendix.   ASSESSMENT: Lower abdominal pain centering mostly on the right lower quadrant. This correlates with CT findings of possible enteritis/regional enteritis. He is C. difficile negative; however, there were no other stool studies done on admission, and he is currently on antibiotics. He has apparently a long history of similar problems going back at least to his colonoscopy done in 2006. At that time, the terminal ileum was intubated, and biopsies were normal. He is currently feeling somewhat better, although he continues with loose stools. He was recently started on Lexapro. Differential diagnosis would include enteritis/regional enteritis/Crohn's disease.   RECOMMENDATIONS:  1. Will repeat LFTs today.  2. Inflammatory bowel disease panel.   3. Small bowel series when clinically feasible. The patient is improving some today. Would continue antibiotics that you have started. It may be necessary to do luminal evaluation; however, since the apparent problem is in the either distal jejunum or ileal region, it may not be possible to visualize this via colonoscopy.  4. Will repeat a.m. plain films.   Will follow with you. Thank you for this consult.   ____________________________ Lollie Sails, MD mus:lb D: 05/25/2013 12:33:39 ET T: 05/25/2013 13:03:28 ET JOB#: [id number]  cc: Lollie Sails, MD, <Dictator> Lollie Sails MD ELECTRONICALLY SIGNED 05/25/2013 15:31

## 2014-09-13 NOTE — Op Note (Signed)
PATIENT NAME:  Sergio Garcia, Sergio Garcia MR#:  [id number] DATE OF BIRTH:  May 27, 1975  DATE OF OPERATION:  05/30/2013  PREOPERATIVE DIAGNOSIS: Enteritis.   POSTOPERATIVE DIAGNOSIS: Enteritis.  PROCEDURE: Diagnostic laparoscopy.   SURGEON: Delos Klich E. Burt Knack, M.D.   ANESTHESIA: General with endotracheal tube.   INDICATIONS: This is a patient with suspected enteritis, but equivocal studies and discrepancies between CT findings and recent small bowel series, with persistent pain dictating the need for diagnostic laparoscopy.   Preoperatively, we discussed rationale for surgery, the options of observation, risks of bleeding, infection, recurrence of symptoms, failure to resolve his symptoms, conversion to an open procedure, possible appendectomy or other procedures. He understood and agreed to proceed, as did his wife.   FINDINGS: Diffuse small bowel erythema and friability, without overt thickening of the small bowel. The majority of this was centered in the mid abdomen, not in the terminal ileum. There was some, but very minimal, creeping fat. The distal ileum was normal. The inflamed segment, which was quite extensive, was hyperperistaltic and erythematous and injected. The appendix was found in a retrocecal position, was not completely elevated, but appeared to be completely normal, that which was seen. The right colon appeared normal. The gallbladder and liver appeared normal, as did the peri-duodenal area, without signs of perforation. The stomach appeared normal, as did the left and sigmoid colon. The pelvis was normal.   Additionally, there was no sign of fixed stricture of the small bowel. The small bowel appeared to be soft and pliable, and there seemed to be a very acute process, without scar. No Meckel's diverticulum was identified. The appendix was not removed.   DESCRIPTION OF PROCEDURE: The patient was induced to general anesthesia. He was on IV antibiotics. VTE prophylaxis was in place. A Foley  catheter was in place. Once he was prepped and draped in sterile fashion, Marcaine was infiltrated in skin and subcutaneous tissues around the periumbilical area. An incision was made. Veress needle was placed. Pneumoperitoneum was obtained, and a 5 mm trocar port was placed. The abdominal cavity was explored. Immediately there was notation made of considerably erythematous but non-thickened small bowel in the central portion of the abdomen. To aid in dissection, a suprapubic 5 mm port was placed. The patient's position was changed from neutral to head down, and rotated right and left to allow for running of the small bowel. Findings were as above, with a normal terminal ileum, normal retrocecal appendix, normal right lower quadrant, but hyperperistaltic and inflamed, but not thickened, small bowel, mostly in the proximal ileum, distal jejunum. No sign of a Meckel's diverticulum. No sign of intussusception, and no sign of perforation. The bowel itself was very friable and tended to bleed if grasped. Thus, the decision to not remove the appendix was placed on the friability of the bowel and the need to manipulate the bowel in order to perform an appendectomy, as well as the fact that it would require a 12 mm port, which was not placed.   With the above findings and the lack of identifiable surgical illness, the abdomen was photographed in multiple angles (note that the color on the photos do not accurately reflect the erythema in the inflamed segments, as noted during the live laparoscopy).   Pneumoperitoneum was released, all ports were removed, and 4-0 subcuticular Monocryl was used at all skin edges. Steri-Strips, Mastisol and sterile dressings were placed.   The patient tolerated the procedure well. There were no complications. He was taken to the recovery  room in stable condition, to be admitted for continued care.    ____________________________ Jerrol Banana. Burt Knack, MD rec:mr D: 05/30/2013 13:11:46  ET T: 05/30/2013 19:20:09 ET JOB#: [id number]  cc: Jerrol Banana. Burt Knack, MD, <Dictator> Florene Glen MD ELECTRONICALLY SIGNED 05/31/2013 9:57

## 2014-09-13 NOTE — Op Note (Signed)
PATIENT NAME:  Sergio Garcia, Sergio Garcia MR#:  [id number] DATE OF BIRTH:  1976-01-01  DATE OF PROCEDURE:  01/31/2014  PREOPERATIVE DIAGNOSES: Abdominal pain, gallbladder polyps by ultrasound.   POSTOPERATIVE DIAGNOSES: Abdominal pain, chronic cholecystitis.   OPERATIVE PROCEDURE: Laparoscopic cholecystectomy with intraoperative cholangiograms.   SURGEON: Hervey Ard, MD.   ANESTHESIA: General endotracheal under Dr. Marcello Moores.   ESTIMATED BLOOD LOSS: Minimal.   CLINICAL NOTE: This 39 year old male has had episodic pain over the last 9 months. Previous laparoscopy completed by Phoebe Perch, MD in January 2015, had suggested edema and early fat wrapping in the mid small bowel. He has had 6 to 8 weeks of recurrent abdominal pain with poor p.o. tolerance. Serial ultrasounds showed development of a 6 mm polyp in the fundus of the gallbladder. Liver functions were normal. Previous UGI/SPFT did not show any abnormality. Given his symptoms, he was felt to be a candidate for possible cholecystectomy.   OPERATIVE NOTE: With the patient under adequate general endotracheal anesthesia, the abdomen was prepped with ChloraPrep and draped. In Trendelenburg position, a Veress needle was placed through a transumbilical incision. Making use of the hanging drop test to confirm intra-abdominal location, the abdomen was insufflated with CO2 at 10 mmHg pressure. A 10 mm step port was expanded and inspection showed no evidence of injury from initial port placement. Inspection of the small bowel showed the bowel to be slightly more reddish in color in the proximal segment and perhaps minimally dilated, but no fat wrapping was noted. No friability appreciated. At the end of the procedure, reinspection from the epigastric site confirmed n o evidence of injury from initial port placement and really no asymmetric coloration or diameter of any the visualized bowel.   The gallbladder was placed on cephalad traction. Extensive adhesions  of the omentum to the undersurface were noted. These were taken down with cautery dissection. Of note, an 11 mm XL port was placed in the epigastrium and two 5-mm step ports were placed laterally under direct vision. The cystic duct was cleared and fluoroscopic cholangiograms completed using 10 mL of 0.5 strength Conray 60. This showed a fairly small cystic duct, as well as only a 2 to 3 mm common hepatic and common bile duct. Free flow into the duodenum was noted. No beading along the ductal structures were noted. Free flow of contrast. No evidence of retained stones. The cystic duct and cystic artery were doubly clipped and divided. The gallbladder was then removed from the liver bed making use of hook cautery dissection. It was delivered through the umbilical port site. The open specimen did not clearly delineate a mucosal polyp, although there was focal thickening of the fundus appreciated.   With pneumoperitoneum re-established, the right upper quadrant was irrigated with lactated Ringer solution. Good hemostasis was noted. The abdomen was then desufflated under direct vision. The fascia at the umbilicus was approximated with a single 0 Vicryl suture. Skin incisions were closed with 4-0 Vicryl subcuticular sutures. Benzoin, Steri-Strips, Telfa and Tegaderm dressings were then applied. The patient tolerated the procedure well and was taken to the recovery room in stable condition.      ____________________________ Robert Bellow, MD jwb:TT D: 01/31/2014 14:19:09 ET T: 01/31/2014 14:39:09 ET JOB#: [id number]  cc: Robert Bellow, MD, <Dictator> Eduard Clos. Gilford Rile, MD Lucio Edward, MD Analiza Cowger Amedeo Kinsman MD ELECTRONICALLY SIGNED 02/03/2014 9:53

## 2014-09-13 NOTE — Consult Note (Signed)
Chief Complaint:  Subjective/Chief Complaint denies n/v, continuing with lower right abdominalpain.  tolerating clears.   VITAL SIGNS/ANCILLARY NOTES: **Vital Signs.:   04-Jan-15 10:14  Vital Signs Type Q 4hr  Temperature Temperature (F) 98.4  Celsius 36.8  Temperature Source oral  Pulse Pulse 68  Respirations Respirations 18  Systolic BP Systolic BP 675  Diastolic BP (mmHg) Diastolic BP (mmHg) 68  Mean BP 82  Pulse Ox % Pulse Ox % 94  Pulse Ox Activity Level  At rest  Oxygen Delivery Room Air/ 21 %   Brief Assessment:  Cardiac Regular   Respiratory clear BS   Gastrointestinal details normal Soft  Bowel sounds normal  No rebound tenderness  tender to palpation ruq/rlq to suprapubic area, mostly rlq, some improvement from yesterday.   Lab Results: Hepatic:  03-Jan-15 13:43   Bilirubin, Total  1.2  Bilirubin, Direct  0.3 (Result(s) reported on 25 May 2013 at 02:16PM.)  Alkaline Phosphatase  40 (45-117 NOTE: New Reference Range 04/12/13)  SGPT (ALT)  123  SGOT (AST)  92  Total Protein, Serum  5.7  Albumin, Serum  3.0  Routine Micro:  02-Jan-15 17:05   Micro Text Report CLOSTRIDIUM DIFFICILE   C.DIFFICILE ANTIGEN       C.DIFFICILE GDH ANTIGEN : NEGATIVE   C.DIFFICILE TOXIN A/B     C.DIFFICILE TOXINS A AND B : NEGATIVE   INTERPRETATION            Negative for C. difficile.    ANTIBIOTIC                        Routine Chem:  03-Jan-15 03:04   Glucose, Serum  118  BUN 16  Creatinine (comp) 1.18  Sodium, Serum  135  Potassium, Serum 4.0  Chloride, Serum 104  CO2, Serum 29  Calcium (Total), Serum 8.6  Anion Gap  2  Osmolality (calc) 272  eGFR (African American) >60  eGFR (Non-African American) >60 (eGFR values <4m/min/1.73 m2 may be an indication of chronic kidney disease (CKD). Calculated eGFR is useful in patients with stable renal function. The eGFR calculation will not be reliable in acutely ill patients when serum creatinine is changing rapidly. It is  not useful in  patients on dialysis. The eGFR calculation may not be applicable to patients at the low and high extremes of body sizes, pregnant women, and vegetarians.)  Routine Hem:  03-Jan-15 03:04   WBC (CBC) 4.5  RBC (CBC) 4.64  Hemoglobin (CBC) 14.1  Hematocrit (CBC) 41.9  Platelet Count (CBC) 210  MCV 91  MCH 30.4  MCHC 33.6  RDW 13.4  Neutrophil % 38.8  Lymphocyte % 38.2  Monocyte % 20.4  Eosinophil % 2.2  Basophil % 0.4  Neutrophil # 1.8  Lymphocyte # 1.7  Monocyte # 0.9  Eosinophil # 0.1  Basophil # 0.0 (Result(s) reported on 25 May 2013 at 03:57AM.)   Radiology Results: XRay:    03-Jan-15 07:50, Abdomen Flat and Erect  Abdomen Flat and Erect   REASON FOR EXAM:    SBO  COMMENTS:       PROCEDURE: DXR - DXR ABDOMEN 2 V FLAT AND ERECT  - May 25 2013  7:50AM     CLINICAL DATA:  Abdominal pain for 2 days    EXAM:  ABDOMEN - 2 VIEW    COMPARISON:  CT ABD-PELV W/ CM dated 05/24/2013    FINDINGS:  There are persistent dilated loops of small bowel up  to 4.5 cm  similar to comparison CT. Oral contrast from CT of 05/24/2013 has  progressed through the bowel and is present within the rectum. The  transverse and descending colon are collapsed. No intraperitoneal  free air. No pathologic calcifications.     IMPRESSION:  Findings are similar to comparison CT suggesting partial small bowel  obstruction.      Electronically Signed    By: Suzy Bouchard M.D.    On: 05/25/2013 08:11         Verified By: Rennis Golden, M.D.,   Assessment/Plan:  Assessment/Plan:  Assessment 1)  enteritis with partial SBO.  question of possible crohns disease versus infective enteritis.  Currently on abx. some mild improvement.  2) abnormal lfts-probably reactive hepatopathy   Plan 1) continue current, will recheck abd films in am, sbs when clinically feasible. following   Electronic Signatures: Loistine Simas (MD)  (Signed 04-Jan-15 14:11)  Authored: Chief Complaint,  VITAL SIGNS/ANCILLARY NOTES, Brief Assessment, Lab Results, Radiology Results, Assessment/Plan   Last Updated: 04-Jan-15 14:11 by Loistine Simas (MD)

## 2014-09-13 NOTE — Consult Note (Signed)
GI follow up: s/p ex -lap today with findings of inflammation of prox small bowel. reports RLQ abd pain much improved. wants to eat.   No n/v. Some pain at surgical site.   CTAreg, no m/r/gmidline surg wound, mild diffuse tendernessno swell, well perf  SBO, prox small bowel inflammation on ex-lap.    ? Crohn'sok to cont steroids, cipro flagyl for nowwill likely require egd and colon as outpatient to better define etiologyDr Gustavo Lah will resume care on Friday.   Electronic Signatures: Arther Dames (MD)  (Signed on 08-Jan-15 18:31)  Authored  Last Updated: 08-Jan-15 18:31 by Arther Dames (MD)

## 2014-09-13 NOTE — Discharge Summary (Signed)
PATIENT NAME:  Sergio Garcia, Sergio Garcia MR#:  [id number] DATE OF BIRTH:  March 28, 1976  DATE OF ADMISSION:  05/24/2013  DATE OF DISCHARGE:  06/01/2013  DIAGNOSIS: Enteritis.   PROCEDURE: Diagnostic laparoscopy.   CONSULTANTS: Dr. Gustavo Lah  HISTORY OF PRESENT ILLNESS AND HOSPITAL COURSE: This is a 39 year old male with a long history of abdominal pain. He had seen Dr. Gustavo Lah in the past, and had at least 2 colonoscopies. He was having diarrhea and nausea, vomiting and abdominal pain in the right side. He was admitted to the hospital by Dr. Pat Patrick for possible bowel obstruction and concern for inflammatory bowel disease. Dr. Gustavo Lah, who had seen the patient in the past, was consulted, and consideration for enteritis was discussed. He was first placed on antibiotics, but failed to improve. He was then placed on steroids, and after 48 hours he had not improved. Therefore, a diagnostic laparoscopy was performed after an upper GI failed to identify any sign of obstruction or thickened bowel that had been seen on CT scan.   At diagnostic laparoscopy, his small bowel was noted to be greatly inflamed and erythematous, but not thickened or scarified as typical Crohn's is usually. This inflammation involved the proximal ileum, distal jejunum, and spared the terminal ileum and colon. There was no sign of a Meckel's, and the appendix was left in situ, as it was not involved, either.   Postoperatively, the patient seemed to feel better. Whether this was the steroid effect after 72 hours or not is unclear, but Dr. Gustavo Lah and I discussed further treatment, and the plan is to continue the steroids for 10 days, along with the antibiotics Cipro and Flagyl, and to follow up in Dr. Marton Redwood office for consideration of additional therapy or a capsule endoscopy, etc. he will be seen in our office in 10 days for evaluation of his wounds. He was given instructions concerning showering, etc.    ____________________________ Jerrol Banana. Burt Knack, MD rec:mr D: 06/01/2013 15:49:40 ET T: 06/01/2013 18:15:55 ET JOB#: [id number]  cc: Jerrol Banana. Burt Knack, MD, <Dictator> Florene Glen MD ELECTRONICALLY SIGNED 06/05/2013 11:46

## 2014-09-13 NOTE — Consult Note (Signed)
PATIENT NAME:  Sergio Garcia, Sergio Garcia MR#:  [id number] DATE OF BIRTH:  [birth year]  DATE OF CONSULTATION:  01/13/2014  PRIMARY CARE PHYSICIAN: Ronette Deter, MD      CONSULTING PHYSICIAN:  Rodena Goldmann III, MD  CHIEF COMPLAINT: Abdominal pain, nausea and vomiting.   BRIEF HISTORY: Sergio Garcia is a 39 year old gentleman with a history of significant abdominal discomfort admitted to the hospital for evaluation and further management.  Over the last several weeks he has had increasing right upper quadrant abdominal pain with colicky-type symptoms associated with profound nausea and vomiting.  He has been unable to keep anything other than "blue Gatorade" down.   He has had intermittent diarrhea.  He has no fever or chills.  A has felt significantly bloated.  He has a long-standing history of abdominal symptoms dating back almost 10 years.  He has been evaluated extensively in [place] and in [place].  He was admitted to the hospital in January 2015 with CT evidence consistent with possible small bowel obstruction. He eventually underwent diagnostic laparoscopy which suggested an inflammatory bowel diagnosis with some friable inflamed small intestine. Further work-up for inflammatory bowel disease was negative. No other significant abnormalities were identified at that time.  In the interim, he has had ultrasound and HIDA scan, all of which were normal except for the demonstration of 6 mm polyp in the gallbladder. This pain has persisted. He has a slightly elevated liver function studies consistent with his history of Gilbert syndrome.  He has been told he has irritable bowel symptoms.  He has had a full hepatic work up with multiple liver function studies and liver evaluation with persistent blood work.  His MRCP which demonstrates some mild stricturing of the common bile duct 10 years ago.  His son was recently diagnosed with Gaucher disease.   Because of his persistent symptoms, weight loss, nausea, and  vomiting he was admitted to the hospital.  He has not undergone any recent EGD or colonoscopy.  He has not had a history of previous abdominal surgery other than the laparoscopy.   CURRENT MEDICATIONS: Listed in his medical work-up on consultation.   FAMILY HISTORY: Noncontributory other than a family history of colon cancer and pancreatic cancer.   REVIEW OF SYSTEMS:  Otherwise unremarkable.   PHYSICAL EXAMINATION:  GENERAL: He is lying comfortably in bed.  He has no specific complaints at the present time. He does not have any significant abdominal pain.  VITALS: Blood pressure 110/66, afebrile. Heart rate 62 and regular.  HEENT: No scleral icterus. No pupillary abnormalities. No facial deformities.  NECK: Supple, nontender with midline trachea.  CHEST: Clear with no adventitious sounds. He has normal pulmonary excursion.  CARDIAC: No murmurs, rubs or gallops to my ear and he seems to be in normal sinus rhythm ABDOMEN:  Exam was largely unremarkable. He does not have any abdominal tenderness on my examination. No CVA tenderness, no rebound, no guarding. No hernias noted.  He has active bowel sounds.  LOWER EXTREMITIES: Full range of motion, no deformities.  PSYCHIATRIC: Normal orientation and a flat affect.   LABORATORY DATA:  I have independently reviewed his laboratory values and the films that are available to [place]. We do not have a lot of his work-up from [place].  At the present time, I do not see any indication for surgical intervention.  He does have a subcentimeter polyp in the gallbladder.  Normally, polyps in the gallbladder are asymptomatic and are removed only in the event  that they enlarge to rule out possible malignancy.  Subcentimeter lymph nodes are rarely malignant and therefore normally do not warrant any surgical intervention.  Certainly, his abdominal symptoms do not suggest biliary tract disease, other than possible ductal sclerosis. However, that workup was nearly a decade  ago and it is unclear to what extent these symptoms are similar to the symptoms that he had at that time.  At the present time, I do not see any surgical indications and will continue to follow him while he is hospitalized.      ____________________________ Rodena Goldmann III, MD rle:DT D: 01/13/2014 16:09:27 ET T: 01/13/2014 17:07:38 ET JOB#: [id number]  cc: Rodena Goldmann III, MD, <Dictator> Rodena Goldmann MD ELECTRONICALLY SIGNED 01/16/2014 17:28

## 2014-09-13 NOTE — H&P (Signed)
PATIENT NAME:  Sergio Garcia, Sergio Garcia MR#:  [id number] DATE OF BIRTH:  15-Aug-1975  DATE OF ADMISSION:  01/12/2014  PRIMARY CARE PHYSICIAN: Ronette Deter, MD  HISTORY OF PRESENT ILLNESS: The patient is a 39 year old Caucasian male with past medical history significant for history of recent workup for abdominal pains, presents back to the hospital with complaints of severe abdominal pain. Apparently, the patient was admitted to Surgical Service at [facility] in January 2015. He was diagnosed with what looks like a small bowel obstruction, which was found on admission CT scan. He was having diarrhea, as well as nausea, vomiting and abdominal pain in the right side of his abdomen. He was admitted because of concern of inflammatory bowel disease. Dr. Gustavo Lah, gastroenterologist, was consulted and followed the patient along. He was initiated on antibiotics initially; however, failed to improve, then he was placed on steroids and he improved. He underwent diagnostic laparoscopy and was noted to have greatly inflamed, erythematous, friable,  but not thickened small bowel diffusely. Terminal ileum was spared. Postoperatively, He did somewhat better and he was discharged on steroids as well as antibiotics, ciprofloxacin and Flagyl. He was supposed to follow up with Dr. Gustavo Lah for further evaluation. He was seen by Dr. Gustavo Lah who continued the antibiotics as well as steroids over the next 1 month and discharged him. Apparently, the patient did relatively well; however, developed recurrent pains approximately a month ago and now over the past 1 month he has been having problems with nausea, vomiting and inability to eat. He is losing weight, he lost approximately 15 pounds over the past 1 month. His pain is described as right upper quadrant, sharp intermittent pain, increasing whenever he takes deep breaths and accompanied by severe nausea and vomiting. No significant pain radiation was noted. He  apparently was seen or about to be seen by Dr. Marlyn Corporal who was recommending possible cholecystectomy. Because of significant pain, he decided to come to Emergency Room for further evaluation and admission. Hospitalist services were contacted for admission. In the Emergency Room, he had ultrasound of his abdomen right upper quadrant, which was unremarkable. Apparently, over the past 8 months, since January, he also had HIDA scan which was unremarkable, colonoscopy as well as EGD and all of those were unremarkable. I did not yield a diagnosis. In the Emergency Room today, the patient is noted to have a mild elevation of total bilirubin; otherwise, his laboratories seem to be unremarkable.   PAST MEDICAL HISTORY: Significant for history of back surgery in 2009, history of gastrointestinal problems over the past 8 months for which the patient underwent ultrasound of his abdomen during this admission to the Emergency Room. Also, HIDA scan, colonoscopy, endoscopy. Also laparoscopic evaluation which revealed small bowel inflammation sparing the terminal ileum. I suspect it is a small bowel obstruction. On admission in January, a small bowel follow through was unremarkable. Anxiety.  MEDICATIONS: According to medical records, the patient is on clonazepam 0.5 mg 1 tablet 3 times daily as needed for anxiety and promethazine 25 mg every 4 to 6 hours as needed.   PAST SURGICAL HISTORY: Also exploratory laparoscopy as mentioned above in January 2015.   ALLERGIES: MORPHINE.   FAMILY HISTORY: Coronary artery disease in the patient's dad who had first MI in early 9s also hypertension, diabetes mellitus in some family members. The patient's maternal grandfather had pancreatic cancer.   SOCIAL HISTORY: The patient is single, has 2 children, dating. He is a nonsmoker, drinks alcohol occasionally socially.  He is a Dealer by occupation.  REVIEW OF SYSTEMS:  CONSTITUTIONAL: Positive for feeling chilly, fatigue and  weak for the past few months, pains in the abdomen, weight loss about 15 pounds, intermittent chest pains, palpitations, also feeling presyncopal whenever he stands up, feels lightheaded as well as dizzy, nausea, vomiting, abdominal pain as well as watery diarrhea over the past 2 or 3 weeks. He is able to tolerate only blue Gatorade and is having some intermittent dysuria symptoms as well as dark urine.  EYES: Denies any high fevers, blurry vision, double vision, glaucoma or cataracts.  EARS, NOSE, THROAT: Denies any tinnitus, allergies, epistaxis, sinus pain, dentures, difficulty swallowing. RESPIRATORY: Denies any cough, wheezes, asthma, COPD.  CARDIOVASCULAR: Denies any orthopnea, edema, arrhythmias. GASTROINTESTINAL: Denies any hematemesis, rectal bleeding, change in bowel habits.  GENITOURINARY: Denies hematuria, frequency or incontinence.  ENDOCRINOLOGY: Denies any polydipsia, nocturia, thyroid problem, heat or cold intolerance or thirst. HEMATOLOGIC: Denies anemia, easy bruising or bleeding, swollen glands. SKIN: Denies acne, rash, lesions, change in moles.  MUSCULOSKELETAL: Denies arthritis, cramps, swelling. NEUROLOGICAL: No numbness, epilepsy or tremor. PSYCHIATRY: Denies anxiety, insomnia, depression.   PHYSICAL EXAMINATION:  VITAL SIGNS: On arrival to the hospital: Temperature 97.6, pulse was 89, respiratory rate was 20, blood pressure 116/80, saturation was 97% on room air.  GENERAL: This is a well-developed, well-nourished Caucasian male, pale and sick looking with sunken eyes. HEENT: His pupils equal, reactive to light. Extraocular muscles intact, no icterus or conjunctivitis. Has normal hearing. No pharyngeal erythema. Mucosa is moist. NECK: No masses. Supple, nontender. Thyroid not enlarged. No adenopathy. No JVD or carotid bruit bilaterally. Full range of motion. LUNGS: Clear to auscultation in all fields. No rales, rhonchi, diminished breath sounds or wheezing. No labored  inspirations, increased effort, dullness to percussion or overt respiratory distress.  CARDIOVASCULAR: S1, S2 appreciated. Rhythm is regular. PMI not lateralized. Chest is nontender to palpation, 1+ pedal pulses. No lower extremity edema, calf tenderness or cyanosis was noted.  ABDOMEN: Soft, minimally tender in the epigastric area as well as the right upper quadrant, but no rebound or guarding were noted. No hepatosplenomegaly or masses were noted.  RECTAL: Deferred.  MUSCLE STRENGTH: Able to move all extremities. No cyanosis, degenerative joint disease or kyphosis. Gait not tested.  SKIN: Did not reveal any rashes, lesions, erythema, nodularity or induration. It was warm and dry to palpation.  LYMPHATIC: No adenopathy in the cervical region.  NEUROLOGIC: Cranial nerves grossly intact. Sensory is intact. No dysarthria or aphasia. The patient is alert, oriented to time, person and place, cooperative. Memory is good. No significant confusion, agitation, or depression was noted.   LABORATORY DATA: 01/12/2014: Glucose level of 178; otherwise, BMP was unremarkable. Lipase level was normal at 83. Liver enzymes were normal, except a total bilirubin was 2.7 and direct bilirubin level is still pending. Otherwise, liver enzymes were normal. White blood cell count is normal at 5.8, hemoglobin 15.1, platelet count 240,000. Absolute neutrophil count is normal at 3.8.   RADIOLOGIC STUDIES: Ultrasound of abdomen, limited survey, 01/12/2014, showed no acute findings, gallbladder polyp which is 7/5/7 mm. Common bile duct was normal at 2.7 mm.   ASSESSMENT AND PLAN:  1.  Right upper quadrant abdominal pain. Admit the patient to the medical floor for pain control. Etiology at present is unclear. We will get a gastroenterologist again involved as well as surgery involved for recommendations. We will start him on clear liquid diet and we will continue on Zofran and Phenergan as well  as adding Reglan or Compazine,  depending on the patient's needs.  2.  Elevated bilirubin. Questionable retained stone. We will get direct bilirubin checked and we will follow. If the patient's bilirubin is high, we may need to get MRCP.  3.  Hyperglycemia. Get hemoglobin A1c.  4.  Presyncopal episodes. Suspect orthostatic hypotension and dehydration. We will continue the patient on high rate IV fluids.   TIME SPENT: Fifty minutes on this patient.    ____________________________ Theodoro Grist, MD rv:TT D: 01/12/2014 19:44:40 ET T: 01/12/2014 21:28:11 ET JOB#: [id number]  cc: Theodoro Grist, MD, <Dictator> Eduard Clos. Gilford Rile, MD Theodoro Grist MD ELECTRONICALLY SIGNED 02/17/2014 20:38

## 2014-09-13 NOTE — Consult Note (Signed)
PATIENT NAME:  Sergio Garcia MR#:  [id number] DATE OF BIRTH:  Dec 10, 1975  DATE OF CONSULTATION:  01/13/2014  REFERRING PHYSICIAN:  Theodoro Grist, MD CONSULTING PHYSICIAN:  Sergio Meuse, NP CONSULTING GASTROENTEROLOGIST: Sergio Lame, MD PRIMARY GASTROENTEROLOGIST: Sergio Riffle. Dagoberto Ligas., MD  PRIMARY CARE PHYSICIAN: Sergio Clos. Walker, MD  REASON FOR CONSULTATION: Right upper quadrant pain, nausea, and vomiting.   HISTORY OF PRESENT ILLNESS: Sergio Garcia is a 39 year old Caucasian male with history of irritable bowel syndrome, Sergio Garcia syndrome, history of partial small bowel obstruction with recent laparoscopy in January 2015, and gallbladder polyp who presents with acute-on-chronic intermittent right upper quadrant pain, nausea, and vomiting.   He was admitted to the hospital on January 8th and had a laparoscopy by Sergio Garcia and was found to have diffuse mid-small-bowel erythema and friability. IBD panel was obtained, which was not consistent with inflammatory bowel Garcia. He also had had a negative small bowel followthrough. He continues to have intermittent right upper quadrant pain that started about 1 month ago. He tells me at first he thought he had a virus. He was seen by his PCP 2 weeks ago, as he felt like he could not keep any food or liquids down due to vomiting. He tells me he has been vomiting 2 to 3 times per day. He can go up to 1 to 2 days without a bowel movement. He denies any diarrhea at this point, although his stools are sometimes loose.   He reports the pain is 8 out of 10 on a pain scale. The pain is stabbing, intermittent. It generally lasts about 10 minutes. It does not radiate. It is not aggravated by eating or movement. It is not alleviated by any measures including rest or movement or bowel movement.   His son was recently diagnosed with Sergio Garcia and is being treated at Sergio Garcia.   He denies any new medications, foreign travel, anti-inflammatories. He reports  weight; loss however, our documents show he has actually gained 14 pounds since his admission in January. He had a negative HIDA scan a couple months ago. He had a celiac panel workup through Sergio Garcia office, which was negative. He also had a full hepatic workup for elevated liver function tests earlier this year, which included ceruloplasmin, alpha-I antitrypsin, iron, ferritin, viral hepatitis, and autoimmune liver Garcia. His LFTs are normal this admission with the exception of a total bilirubin of 2.2, which is mostly indirect, consistent with his Sergio Garcia syndrome. His CBC is normal. He had an ultrasound which showed a 2.7-mm common bile duct and a 7 x 5 x-mm gallbladder polyp. Ultrasound was otherwise normal. Interestingly, he had an MRCP which showed stricturing of the common bile duct, right and left hepatic ducts back in 2007.   PAST MEDICAL AND SURGICAL HISTORY: Irritable bowel syndrome, Sergio Garcia syndrome, anxiety, depression, back surgery, colonoscopy in September 2006 which was normal with normal biopsies and an EGD in 2006, which showed duodenitis, but normal biopsies. Gallbladder polyp and partial small bowel obstruction status post laparoscopy in January 2015 by Sergio Garcia.   MEDICATIONS PRIOR TO ADMISSION: Clonazepam 0.5 mg t.i.d. p.r.n., promethazine 25 mg q.4-6 hours p.r.n.   ALLERGIES: INCLUDE MORPHINE, WHICH CAUSES GI DISTRESS.   FAMILY HISTORY: Positive for maternal grandfather with pancreatic carcinoma. He believes he may have had colon carcinoma as well but he is unsure if he had metastatic Garcia. No other family history is pertinent.   SOCIAL HISTORY: He is single. He has 2 children and  lives with his girlfriend and her child. 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 He denies any illicit drug use. He denies any alcohol or tobacco use.   REVIEW OF SYSTEMS: See HPI; otherwise, negative 10 point review of systems.   PHYSICAL EXAMINATION:   VITAL SIGNS: Temperature 97.9, pulse 49, respirations 16, blood pressure 99/62, O2 saturation 97% on room air. Height 74 inches, weight 190 pounds. BMI 24.4.  GENERAL: He is a well-developed, well-nourished Caucasian male in no acute distress.  HEENT: Sclerae clear, anicteric. Conjunctivae pink. Oropharynx pink and moist without lesions.  NECK: Supple without mass or thyromegaly.  HEART: Regular rate and rhythm. Normal S1, S2 without murmurs, clicks, rubs, or gallops.  LUNGS: Clear to percussion bilaterally.  ABDOMEN: Positive bowel sounds x4. No bruits auscultated. Abdomen is soft, nondistended. He does have a positive Carnett sign elicited with flexed abdominal muscles. I am able to reproduce his right upper quadrant pain. Otherwise, abdomen is soft and nontender. No rebound, tenderness or guarding. No hepatosplenomegaly or mass.  EXTREMITIES: Without clubbing or edema.  SKIN: Pink, warm, and dry without any rash or jaundice.  NEUROLOGIC: Grossly intact.  MUSCULOSKELETAL: Good equal movement and strength bilaterally.  PSYCHIATRIC: Alert, cooperative, normal mood and affect. He is accompanied by his grandmother.   LABORATORY STUDIES: Glucose 134, calcium 8.3, anion gap 4. Otherwise normal basic metabolic panel. His lipase was normal at 83. His hemoglobin A1c is normal at 6.3. Total protein 6.1, albumin 3.2, total bilirubin 2.2. Alkaline phosphatase 44, AST 17, ALT 12. CBC is normal. Urinalysis is normal.   IMPRESSION: Sergio Garcia is a pleasant 39 year old Caucasian male with history of irritable bowel syndrome, partial small bowel obstruction status post laparoscopy in January 2015, gallbladder polyp, and Gilbert syndrome, who presents with acute-on-chronic intermittent right upper quadrant pain, nausea, and vomiting. He has had an extensive GI workup from Dr. Gustavo Lah and Dr. Lucio Edward in [place] including negative hepatic liver Garcia workup, negative inflammatory bowel Garcia workup,  negative gallbladder workup except polyp with normal HIDA scan and negative celiac panel. Interestingly, MRCP from 2007 showed strictures of the common bile duct and both right and left hepatic ducts. His last EGD and colonoscopy in 2006 with biopsies were normal except duodenitis with normal biopsies pink.   On exam today, he has reproducible right upper quadrant abdominal wall pain, which I believe is contributing to his pain. He may also have silent reflux, gastritis, or peptic ulcer Garcia contributing to his nausea and vomiting. Constipation may be playing a role as well. A surgical consult is pending for consideration of cholecystectomy as well. I will review his care with Dr. Lucilla Garcia.   PLAN:  1.  Agree with antiemetics, fluids, and supportive measures.  2.  Add Protonix 40 mg daily.  3.  Add MiraLax 17 grams daily.  4.  Consider EGD if no improvement in symptoms.  5.  Await surgical recommendations.    ____________________________ Sergio Meuse, NP [id number] D: 01/13/2014 11:04:15 ET T: 01/13/2014 11:28:41 ET JOB#: [id number]  cc: Sergio Meuse, NP, <Dictator> Sergio Clos. Gilford Rile, MD Sergio Riffle. Dagoberto Ligas., MD  Sergio Meuse FNP ELECTRONICALLY SIGNED 01/20/2014 14:32

## 2014-09-13 NOTE — Consult Note (Signed)
Chief Complaint:  Subjective/Chief Complaint patient tolerating soft po, less abdominal pain (2/10), no bm today, diarrhea yesterday.   VITAL SIGNS/ANCILLARY NOTES: **Vital Signs.:   09-Jan-15 14:53  Vital Signs Type Routine  Temperature Temperature (F) 98.6  Celsius 37  Pulse Pulse 73  Respirations Respirations 16  Systolic BP Systolic BP 629  Diastolic BP (mmHg) Diastolic BP (mmHg) 80  Mean BP 96  Pulse Ox % Pulse Ox % 94  Pulse Ox Activity Level  At rest  Oxygen Delivery Room Air/ 21 %   Brief Assessment:  Cardiac Regular   Respiratory clear BS   Gastrointestinal details normal Soft  Nondistended  Bowel sounds normal  No rebound tenderness  No gaurding  minimal tenderness in the rlq, mild tender at lap incision site.   Lab Results:  General Ref:  03-Jan-15 13:43   C-Reactive Protein ========== TEST NAME ==========  ========= RESULTS =========  = REFERENCE RANGE =  C-REACTIVE PROTEIN,QUANT  C-Reactive Protein, Quant C-Reactive Protein, Quant       [H  20.2 mg/L            ]           0.0-4.9               [facility]            No: [id number]           [address], [place], [place] [address]           Lindon Romp, MD         [phone]   Result(s) reported on 27 May 2013 at 05:20PM.  Routine Hem:  08-Jan-15 06:04   WBC (CBC)  12.1  09-Jan-15 09:37   WBC (CBC)  12.2  RBC (CBC) 4.98  Hemoglobin (CBC) 14.8  Hematocrit (CBC) 44.8  Platelet Count (CBC) 274  MCV 90  MCH 29.7  MCHC 33.0  RDW 14.1  Neutrophil % 85.1  Lymphocyte % 10.3  Monocyte % 4.5  Eosinophil % 0.0  Basophil % 0.1  Neutrophil #  10.3  Lymphocyte # 1.3  Monocyte # 0.6  Eosinophil # 0.0  Basophil # 0.0 (Result(s) reported on 31 May 2013 at 10:33AM.)   Radiology Results: XRay:    07-Jan-15 13:45, Small Bowel  Small Bowel   REASON FOR EXAM:    enteritis, possible crohn's disease, see CT 2 days   ago.  COMMENTS:       PROCEDURE: FL  - FL SMALL BOWEL  - May 29 2013  1:45PM     CLINICAL DATA:  Right-sided abdominal pain. Recent small bowel  obstruction.    EXAM:  SMALL BOWEL SERIES    TECHNIQUE:  Following ingestion of thin barium, serial small bowel images were  obtained including spot views of the terminal ileum.  COMPARISON:  CT on 05/24/2013    FLUOROSCOPY TIME:  1 min 0 seconds    FINDINGS:  The scout radiograph shows no evidence of small bowel dilatation.    Small bowel series shows normal small bowel transit time with  contrast reaching the colon on the 2 hr 30 min film. There is no  evidence of small bowel dilatation or obstruction. No mass or  stricture seen involving the small bowel. No evidence of small bowel  wall thickening. Jejunal and ileal fold patterns are within normal  limits. Spot views of the terminal ileum are normal in appearance.     IMPRESSION:  Negative small bowel  series.      Electronically Signed    By: Earle Gell M.D.    On: 05/29/2013 16:43         Verified By: Marlaine Hind, M.D.,   Assessment/Plan:  Assessment/Plan:  Assessment 1) enteritis/ possible seorsitis-uncertain etiology, patient improving no n/v , much improved abdominal pain.  no bm today, diarrhea yesterday   Plan 1) change solumedrol to prednisone 40 mg po daily 2) continue flagyl and cipro to complete a 7-10 day course total 3) low residue diet 4) GI fu in a week with Dr Jimmie Molly will taper prednisone as outpatient.  discussed with Dr Burt Knack.   Electronic Signatures: Loistine Simas (MD)  (Signed 09-Jan-15 18:56)  Authored: Chief Complaint, VITAL SIGNS/ANCILLARY NOTES, Brief Assessment, Lab Results, Radiology Results, Assessment/Plan   Last Updated: 09-Jan-15 18:56 by Loistine Simas (MD)

## 2014-09-13 NOTE — Consult Note (Signed)
Brief Consult Note: Diagnosis: abdominal pain and abnormal CT of abdomen.   Patient was seen by consultant.   Consult note dictated.   Recommend further assessment or treatment.   Orders entered.   Discussed with Attending MD.   Comments: Patietn seen and examined, please see full GI consult [phone].  Patietn admitted with abdominal pain, n/v and diarrhea of relatively acute onset over the course of 2 days or so.  CT showing small bowel thickening of the distal jejunum and possible transition point in the rlq, possible distal ileum.  Colonoscopy for similar presentation in 2009 negative including biopsies.  No rectal bleeding or black stools.  Elevated transaminases also noted with normal AP and minimal elevation of bili, without fractionation.  Patietn states he is feeling a little better today, tolerating clears.  Recommend IBD panel, lfts, SBS when clinically feasible.  Patient is feeling some better today.  Continue current, may be necessary to repeat colonoscopy, but may not be able to access the part of the small bowel affected on CT, and possible partial sbo a relative contraindication.  Will repeat 2 way abd film in am. Following.  Electronic Signatures: Loistine Simas (MD)  (Signed 03-Jan-15 12:41)  Authored: Brief Consult Note   Last Updated: 03-Jan-15 12:41 by Loistine Simas (MD)

## 2014-09-13 NOTE — Consult Note (Signed)
Brief Consult Note: Diagnosis: RUQ pain, N/V.   Patient was seen by consultant.   Comments: [name] is a pleasant 39 y/o caucasian male with hx IBS, partial small bowel obstruction s/p laparoscopy 1/15, gallbladder polyp & Gilbert's syndrome who presents with acute on chronic intermittent RUQ pain, nausea & vomiting.  He has had extensive GI workup from Dr Gustavo Lah & Dr Lucio Edward in [place] including negative hepatic liver disease workup, negative inflammatory bowel disease workup, negative gallbladder workup except polyp with normal HIDA & negative celiac panel.  Interestingly,. MRCP from 2007 showed strictures CBD & both right & left hepatic ducts.  Last EGD/colonoscopy in 2006 with biopsies were normal except duodenitis with normal biopsies.  On exam today, he has reproducable RUQ abdominal wall pain which I believe is contributing to his pain.  He may also have silent reflux, gastritis or PUD contributing to his nausea & vomiting.  Constipation may be playing a role as well.  A surgical consult is pending for consideration of cholecystectomy as well.  I will review his care with Dr [name].  Plan: 1) Agree with antiemetics, fluids, supportive measures 2) Add protonix 40mg  daily 3) Add miralax 17 grams daily 4) consider EGD if no improvement in symptoms 5) await surgical recommendations Thanks for allowing [place] to participate in his care.  Please see full dictated note. #[id number].  Electronic Signatures for Addendum Section:  Andria Meuse (NP) (Signed Addendum 24-Aug-15 17:09)  Discussed with Dr Allen Norris.  Appreciate surgery's input.  Plan for EGD tomorrow.  Discussed risks/benefits of procedure which include but are not limited to bleeding, infection, perforation & drug reaction.  Patient agrees with this plan & consent will be obtained.  Endo aware.   Electronic Signatures: Andria Meuse (NP)  (Signed 24-Aug-15 11:04)  Authored: Brief Consult Note   Last Updated: 24-Aug-15  17:09 by Andria Meuse (NP)

## 2014-09-13 NOTE — Consult Note (Signed)
Chief Complaint:  Subjective/Chief Complaint continuing with right abd pain, mostly rlq to suprapubic.  mild nausea no emesis.  pain was improving until this am, some increase today.  passing flatus, last bm yesterday.   VITAL SIGNS/ANCILLARY NOTES: **Vital Signs.:   05-Jan-15 02:20  Oxygen Delivery Room Air/ 21 %    09:21  Vital Signs Type Q 4hr  Temperature Temperature (F) 97.3  Celsius 36.2  Temperature Source oral  Pulse Pulse 69  Respirations Respirations 18  Systolic BP Systolic BP 237  Diastolic BP (mmHg) Diastolic BP (mmHg) 70  Mean BP 80  Pulse Ox % Pulse Ox % 100  Pulse Ox Activity Level  At rest  Oxygen Delivery Room Air/ 21 %   Brief Assessment:  Cardiac Regular   Respiratory clear BS   Gastrointestinal details normal Soft  Nondistended  No masses palpable  No gaurding  positive bowel sounds, tender to palpation  rlq to suprapubic, no rebound.   Lab Results: Hepatic:  03-Jan-15 13:43   Albumin, Serum  3.0  Routine Micro:  02-Jan-15 17:05   Micro Text Report CLOSTRIDIUM DIFFICILE   C.DIFFICILE ANTIGEN       C.DIFFICILE GDH ANTIGEN : NEGATIVE   C.DIFFICILE TOXIN A/B     C.DIFFICILE TOXINS A AND B : NEGATIVE   INTERPRETATION            Negative for C. difficile.    ANTIBIOTIC                        General Ref:  03-Jan-15 13:43   C-Reactive Protein ========== TEST NAME ==========  ========= RESULTS =========  = REFERENCE RANGE =  C-REACTIVE PROTEIN,QUANT  C-Reactive Protein, Quant C-Reactive Protein, Quant       [H  20.2 mg/L            ]           0.0-4.9               [facility]            No: [id number]           [address], [place], [place] [address]           Lindon Romp, MD         [phone]   Result(s) reported on 26 May 2013 at 10:47PM.   Radiology Results: XRay:    05-Jan-15 08:02, Abdomen Flat and Erect  Abdomen Flat and Erect   REASON FOR EXAM:    partial sbo, please compare to previous film  COMMENTS:        PROCEDURE: DXR - DXR ABDOMEN 2 V FLAT AND ERECT  - May 27 2013  8:02AM     CLINICAL DATA:  Partial small bowel obstruction.    EXAM:  ABDOMEN - 2 VIEW    COMPARISON:  05/26/2013.  05/25/2013.  CT scan from 05/24/2013.    FINDINGS:  Upright film shows no evidence for intraperitoneal free air.  Supine film shows interval decrease in gaseous small bowel  distention although small bowel loops do remain diffusely  air-filled. Small bowel in the right abdomen measures up to 3.6 cm  in diameter. There is gas seen along the length of a nondilated  colon.    Visualized bony structures are unremarkable. Phleboliths are noted  over the anatomic pelvis bilaterally.     IMPRESSION:  Although small bowel remains diffusely distended, the degree of  small bowel  dilatation has decreased in the interval. Gas remains  visible along the length of the colon as well, which is nondilated.    Electronically Signed    By: Misty Stanley M.D.    On: 05/27/2013 08:06         Verified By: ERIC A. MANSELL, M.D.,   Assessment/Plan:  Assessment/Plan:  Assessment 1) abdominal pain, enteritis with possible partial small bowel obstruction/stenosis. concern for crohns disease/infective enteritis.   Plan 1) elevated CRP noted, index of suspicion for crohns disease increased reather than infective etiology.  Will continue abx, start iv solumedrol.  discussed with surgery. Specialty labs ordered for IBD serologies will likely be another week.  Discussed with Dr Burt Knack.   Electronic Signatures: Loistine Simas (MD)  (Signed 05-Jan-15 12:28)  Authored: Chief Complaint, VITAL SIGNS/ANCILLARY NOTES, Brief Assessment, Lab Results, Radiology Results, Assessment/Plan   Last Updated: 05-Jan-15 12:28 by Loistine Simas (MD)

## 2014-09-13 NOTE — Consult Note (Signed)
Chief Complaint:  Subjective/Chief Complaint seen for enteritis, abdominalpain.  Stable, continues with abd pain, though inproving, tolerating meds, no nausea or emesis.  passing flatus and loose/watery stool   VITAL SIGNS/ANCILLARY NOTES: **Vital Signs.:   06-Jan-15 10:15  Vital Signs Type Q 4hr  Temperature Temperature (F) 97.7  Celsius 36.5  Temperature Source oral  Pulse Pulse 80  Respirations Respirations 17  Systolic BP Systolic BP 829  Diastolic BP (mmHg) Diastolic BP (mmHg) 71  Mean BP 84  Pulse Ox % Pulse Ox % 95  Pulse Ox Activity Level  At rest  Oxygen Delivery Room Air/ 21 %   Brief Assessment:  Cardiac Regular   Respiratory clear BS   Gastrointestinal details normal Soft  Nondistended  Bowel sounds normal  No rebound tenderness  No gaurding  postiive abdominalpain to palpation rlq, however improved from yesterday   Lab Results:  Hepatic:  05-Jan-15 12:38   Bilirubin, Total  1.1  Bilirubin, Direct 0.2 (Result(s) reported on 27 May 2013 at 01:14PM.)  Alkaline Phosphatase 45 (45-117 NOTE: New Reference Range 04/12/13)  SGPT (ALT)  96  SGOT (AST)  43  Total Protein, Serum 6.4  Albumin, Serum  3.1  General Ref:  03-Jan-15 13:43   C-Reactive Protein ========== TEST NAME ==========  ========= RESULTS =========  = REFERENCE RANGE =  C-REACTIVE PROTEIN,QUANT  C-Reactive Protein, Quant C-Reactive Protein, Quant       [H  20.2 mg/L            ]           0.0-4.9               [facility]            No: [id number]           [address], [place], [place] [address]           Lindon Romp, MD         [phone]   Result(s) reported on 27 May 2013 at 05:20PM.  Routine Chem:  05-Jan-15 12:38   Glucose, Serum 87  BUN  4  Creatinine (comp) 0.90  Sodium, Serum 139  Potassium, Serum 4.1  Chloride, Serum 104  CO2, Serum  35  Calcium (Total), Serum 8.9  Anion Gap  0  Osmolality (calc) 274  eGFR (African American) >60  eGFR (Non-African  American) >60 (eGFR values <22m/min/1.73 m2 may be an indication of chronic kidney disease (CKD). Calculated eGFR is useful in patients with stable renal function. The eGFR calculation will not be reliable in acutely ill patients when serum creatinine is changing rapidly. It is not useful in  patients on dialysis. The eGFR calculation may not be applicable to patients at the low and high extremes of body sizes, pregnant women, and vegetarians.)  Routine Hem:  05-Jan-15 12:38   WBC (CBC) 5.3  RBC (CBC) 4.87  Hemoglobin (CBC) 14.5  Hematocrit (CBC) 43.4  Platelet Count (CBC) 235  MCV 89  MCH 29.7  MCHC 33.3  RDW 13.5  Neutrophil % 51.1  Lymphocyte % 34.2  Monocyte % 12.6  Eosinophil % 1.5  Basophil % 0.6  Neutrophil # 2.7  Lymphocyte # 1.8  Monocyte # 0.7  Eosinophil # 0.1  Basophil # 0.0 (Result(s) reported on 27 May 2013 at 01:14PM.)   Radiology Results: [place]    02-Jan-15 09:36, [place]  [place]   REASON FOR EXAM:    RUQ pain, elevated LFTs  COMMENTS:   May transport without cardiac monitor    PROCEDURE: [place]  - US ABDOMEN LIMITED SURVEY  - May 24 2013  9:36AM     CLINICAL DATA:  Right upper quadrant pain.  Elevated LFTs.    EXAM:  US ABDOMEN LIMITED - RIGHT UPPER QUADRANT    COMPARISON:  10/02/2009    FINDINGS:  Gallbladder  No gallstones or wall thickening visualized. No sonographic Murphy  sign noted.    Common bile duct    Diameter: Normal caliber, 2 mm    Liver:    No focal lesion identified. Within normal limits in parenchymal  echogenicity.     IMPRESSION:  Unremarkable right upper quadrant ultrasound.  Electronically Signed    By: Rolm Baptise M.D.    On: 05/24/2013 09:39         Verified By: Raelyn Number, M.D.,   Assessment/Plan:  Assessment/Plan:  Assessment 1) abdominalpain, diarrhea, CT c/w enteritis, possible crohns disease.  symptoms improving.  Will arrange for SBS tomorrow am.  2) abnormal  lfts-improving, likely reactive.   Plan as above. continue current.   Electronic Signatures for Addendum Section:  Loistine Simas (MD) (Signed Addendum 06-Jan-15 15:51)  note: initial IBD serologies negative, awaiting [employer] panel result.   Electronic Signatures: Loistine Simas (MD)  (Signed 06-Jan-15 15:47)  Authored: Chief Complaint, VITAL SIGNS/ANCILLARY NOTES, Brief Assessment, Lab Results, Radiology Results, Assessment/Plan   Last Updated: 06-Jan-15 15:51 by Loistine Simas (MD)

## 2014-09-13 NOTE — H&P (Signed)
PATIENT NAME:  Sergio Garcia, Sergio Garcia MR#:  [id number] DATE OF BIRTH:  [birth year]  DATE OF ADMISSION:  05/24/2013  PRIMARY CARE PHYSICIAN: Ronette Deter at [facility] in [place].   ADMITTING PHYSICIAN: Dr. Pat Patrick.   CHIEF COMPLAINT: Abdominal pain, nausea, vomiting, diarrhea.   BRIEF HISTORY: Sergio Garcia is a 39 year old gentleman seen in the Emergency Room with a 3 day history of abdominal pain. He started off with primarily nausea and noted some flu-like symptoms with malaise. He began to vomit and had significant problems with nausea, vomiting and then symptoms progressed to having diarrhea. For the last 3 days, he has had 10 to 12 bowel movements a day, mostly brown water with very little stool. Last night he began to develop some significant crampy right lower quadrant abdominal pain with 10 to 15 second cramps followed by 5 to 10 minutes of constant pain and then re-exacerbated by crampy abdominal pain. The pain was severe and he could not tolerate it so he came to the Emergency Room for further evaluation.   He denies any previous similar symptoms. He does have a history of irritable bowel syndrome and has been on medication for that for some time. He was seen by the GI service 8 years ago for some abdominal discomfort. He underwent colonoscopy and upper endoscopy. Colonoscopy demonstrated an erythematous terminal ileum, but I do not have the biopsy reports. He was  never told that he had any serious problem with regard to his ileum and was not started on any medication for that. 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 His last ultrasound demonstrated a 2 mm common bile duct. His liver functions are slightly elevated. Elevated liver function studies several years ago are what prompted his initial evaluation by the GI service. He takes no medication for any GI problems. Denies any other GI history. Specifically, denies any history of hepatitis, yellow jaundice,  pancreatitis, peptic ulcer disease, gallbladder disease or diverticulitis. He has had no abdominal surgery. His only previous surgery was a lumbar decompression. He has no history of cardiac disease, hypertension, diabetes or thyroid problems. He takes Lexapro and Klonopin. These are his only normal medications. He does not have any medical allergies. He does not smoke cigarettes and drinks alcohol only occasionally. He is regularly followed by Ronette Deter at [facility] in [place].   REVIEW OF SYSTEMS:  Otherwise unremarkable.   FAMILY HISTORY: Noncontributory in this situation.   PHYSICAL EXAMINATION: GENERAL: He is an alert, pale, washed out appearing gentleman in no significant distress. VITAL SIGNS:  His blood pressure is 120/80. Heart rate is 85, temperature is 97.9.  HEENT: No scleral icterus. No pupillary abnormalities. No facial deformities.  NECK: Supple, nontender with a midline trachea and no adenopathy.  CHEST: Clear with normal pulmonary excursion. No adventitious sounds.  CARDIAC: No murmurs or gallops. He seems to be in normal sinus rhythm.  ABDOMEN: Slightly distended, soft, but has some definite tenderness in the right lower quadrant with guarding, point tenderness and some mild rebound and referred rebound.  LOWER EXTREMITIES: Reveal full range of motion, no deformities. Good distal pulses.  PSYCHIATRIC: Reveals normal orientation, normal affect.   I have independently reviewed his CT scan. It is a contrasted CT scan. It is read as significant mismatch in bowel size but I really cannot see any obvious evidence of bowel obstruction. His primary symptom is nausea, vomiting, diarrhea and he has profound diarrhea. He has history of having had previous  terminal ileum problems which defied diagnosis 8 years ago and the fact the CT scan does show his terminal ileum to be involved in the current process, makes me wonder if he does not have underlying ileitis or inflammatory bowel  disease. He has not vomited in 12 hours. His stomach is not significantly dilated on his CT scan. We will plan to admit him to  the hospital, rehydrate him, start him on empiric antibiotics. Se how he does over the next 12 to 24 hours. He may need another GI evaluation. He has been seen by the [place] doctors in the past. Should there be some change in his status, we will consider nasogastric decompression. I do not see any indication for surgical intervention at the present time. This plan has been discussed with the patient and his wife. They are in agreement.    ____________________________ Micheline Maze, MD rle:dp D: 05/24/2013 15:10:09 ET T: 05/24/2013 16:00:03 ET JOB#: [id number]  cc: Micheline Maze, MD, <Dictator> Eduard Clos. Gilford Rile, MD Rodena Goldmann MD ELECTRONICALLY SIGNED 05/28/2013 20:37

## 2014-09-13 NOTE — Discharge Summary (Signed)
PATIENT NAME:  Sergio Garcia, Sergio Garcia MR#:  [id number] DATE OF BIRTH:  [birth year]  DATE OF ADMISSION:  01/12/2014 DATE OF DISCHARGE:  01/14/2014  ADMISSION DIAGNOSIS: Abdominal pain.   DISCHARGE DIAGNOSES:  1. Abdominal pain with an ultrasound showing gallbladder polyp.  2. 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  3. Hyperglycemia with an A1c of 6.3.  4. Presyncope from dehydration.   CONSULTATIONS:  1. GI. 2. Dr. Pat Patrick.  3. EGD showed gastritis.  4. Ultrasound of the abdomen showed a small gallbladder polyp.   HOSPITAL COURSE: A 39 year old male who has had chronic abdominal pain presents with nausea and vomiting and abdominal pain. For further details, please refer to the H and P.  1. Right upper quadrant abdominal pain. As per GI he has had extensive GI workup including negative hepatic liver disease, workup negative inflammatory bowel disease, workup negative gallbladder workup except a polyp with normal HIDA scan and negative celiac panel. Surgery consultation was appreciated. At this time, Dr. Pat Patrick does not feel that there is a surgical indication if symptoms are not related to a gallbladder disease at this time. EGD: The patient did undergo an EGD with Dr. Allen Norris which showed gastritis. Again, this is not the etiology of his symptoms. The patient is on a PPI. He will need supportive care and close followup. No surgical intervention was recommended. However, the patient was given the option for a second opinion.  2. Elevated bilirubin from [place] syndrome.  3. Hyperglycemia. The patient's A1c 6.3. He was referred to outpatient diabetes.  4. Presyncope from dehydration, poor p.o. intake, and abdominal pain.   DISCHARGE MEDICATIONS:  1. Clonazepam 0.5 mg t.i.d. p.r.n.  2. Promethazine 25 mg q. 4-6 hours p.r.n. nausea, vomiting. 3. Pantoprazole 40 mg p.o. b.i.d.   DISCHARGE DIET: Regular diet.   DISCHARGE ACTIVITY: As tolerated.   DISCHARGE FOLLOWUP: The patient can follow up  with Dr. Allen Norris in 2 weeks and Dr. Ronette Deter in 1 week.  TIME SPENT: 35 minutes.    ____________________________ Sergio Beers. Benjie Karvonen, MD spm:lt D: 01/14/2014 14:22:14 ET T: 01/14/2014 22:09:35 ET JOB#: [id number]  cc: Laberta Wilbon P. Benjie Karvonen, MD, <Dictator> Eduard Clos. Gilford Rile, MD [name], MD Kenijah Benningfield P Tien Spooner MD ELECTRONICALLY SIGNED 01/15/2014 10:28

## 2014-09-14 IMAGING — CR DG CHEST 2V
2 series · 2 of 2 positions shown · non-contrast
Comparison: October 07, 2004

CLINICAL DATA: Chest pain and shortness of breath

CHEST - 2 VIEW

[w chest pa]
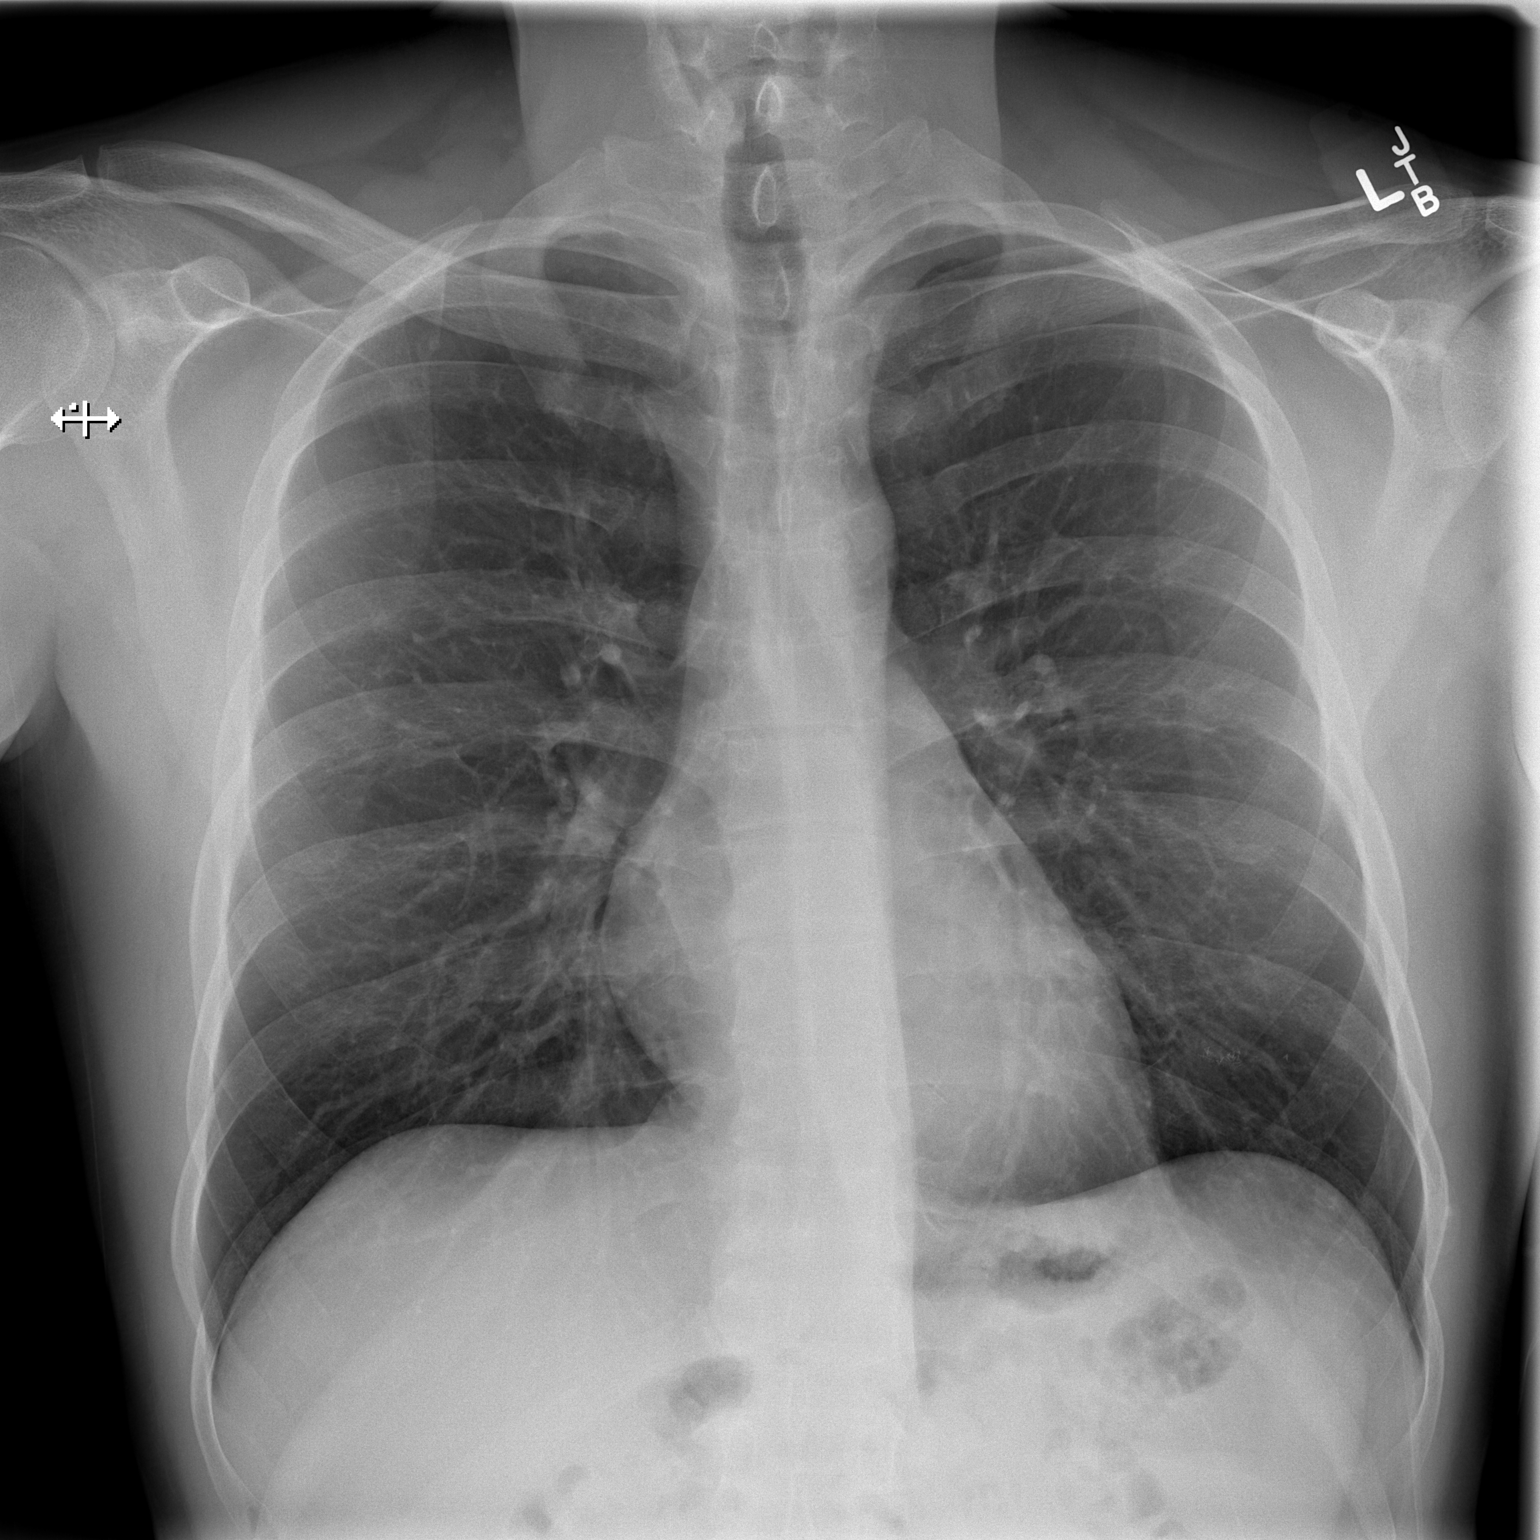

[w chest lat]
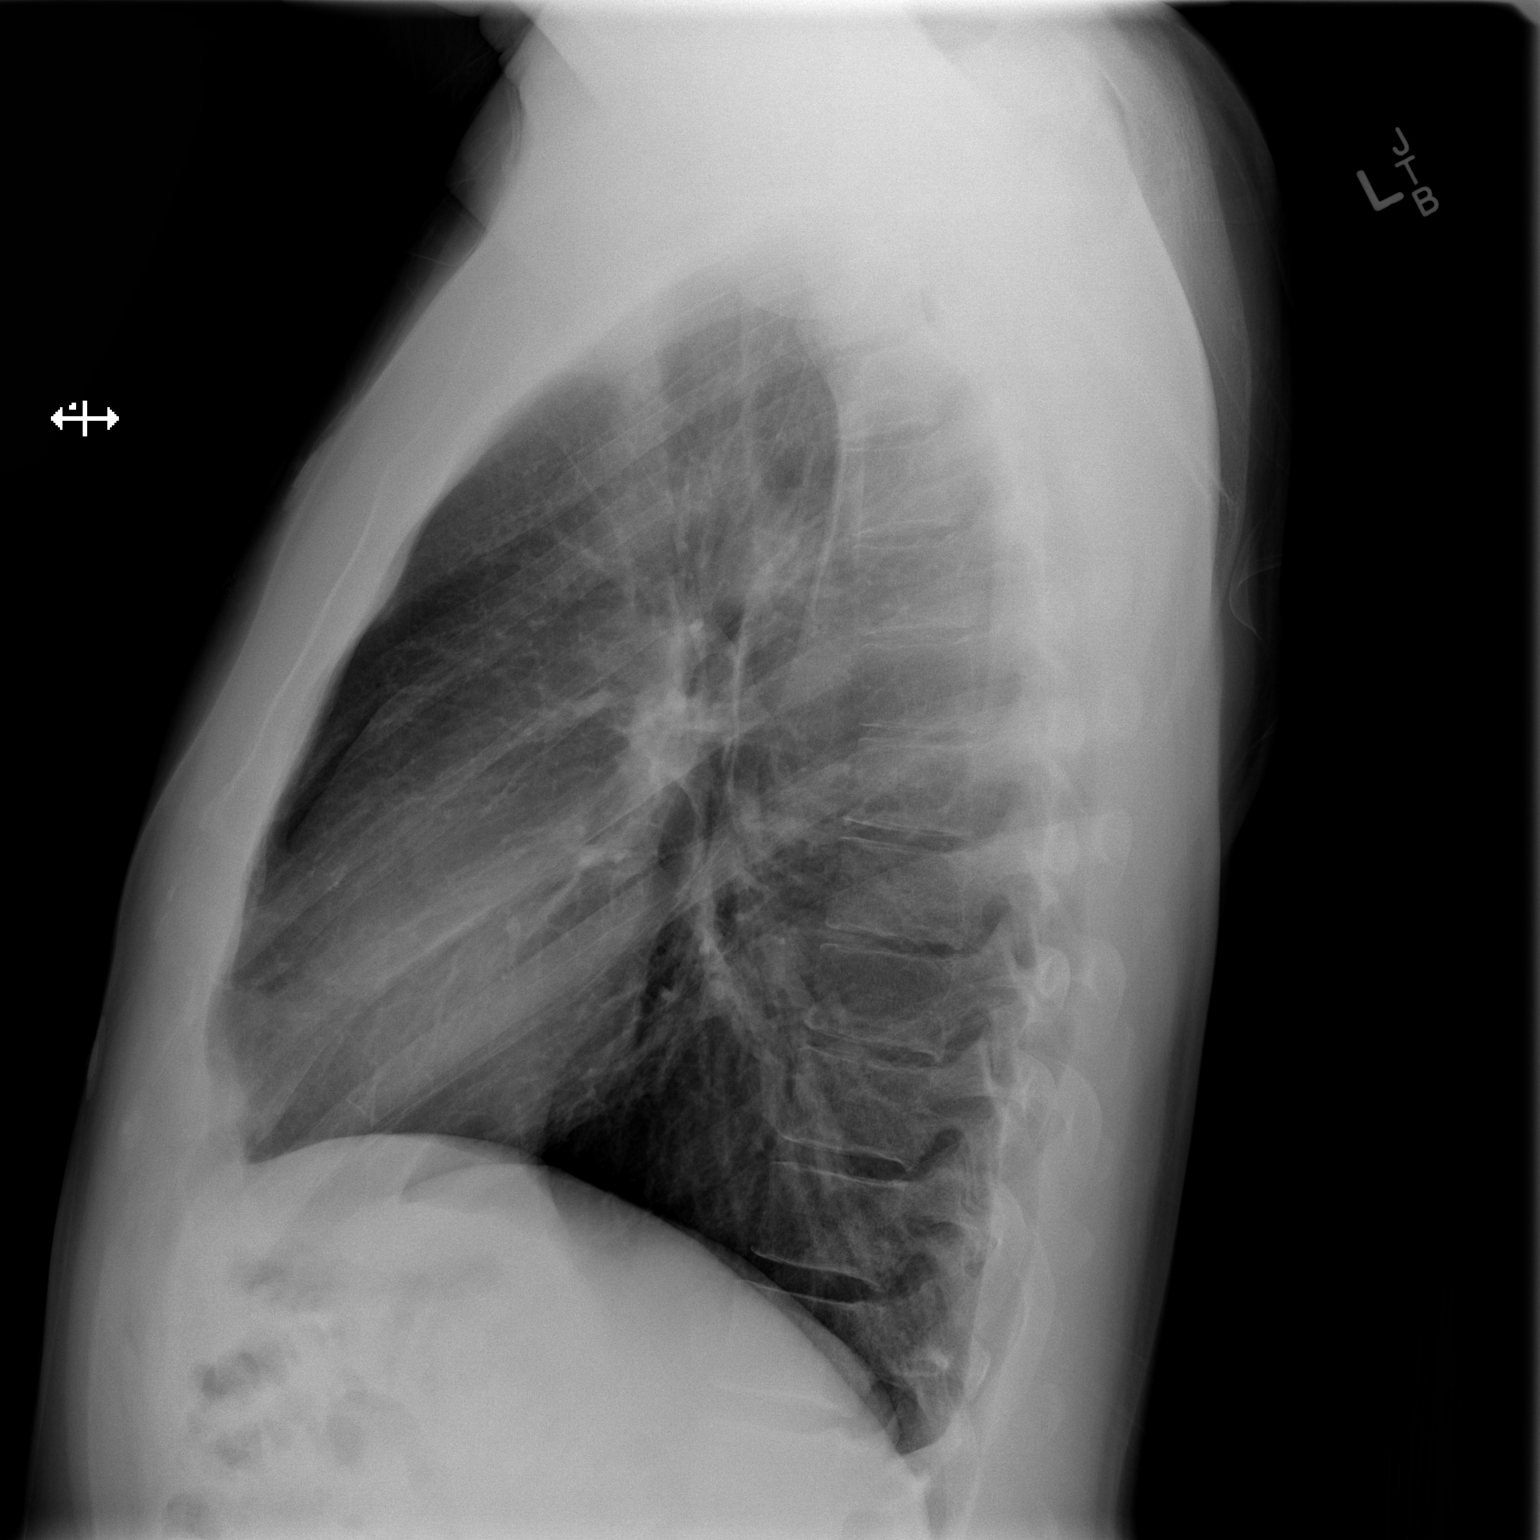

[2 of 2 positions shown; findings below may reference images not displayed]

FINDINGS: Lungs clear.  Heart size and pulmonary vascularity are
normal.  No adenopathy.  No pneumothorax.  No bone lesions.
IMPRESSION: No abnormality noted.

## 2014-10-07 ENCOUNTER — Encounter: Payer: Self-pay | Admitting: Internal Medicine

## 2014-10-07 ENCOUNTER — Ambulatory Visit (INDEPENDENT_AMBULATORY_CARE_PROVIDER_SITE_OTHER): Payer: BC Managed Care – PPO | Admitting: Internal Medicine

## 2014-10-07 VITALS — BP 108/70 | HR 75 | Temp 98.1°F | Ht 72.0 in | Wt 182.4 lb

## 2014-10-07 DIAGNOSIS — F4323 Adjustment disorder with mixed anxiety and depressed mood: Secondary | ICD-10-CM

## 2014-10-07 DIAGNOSIS — F411 Generalized anxiety disorder: Secondary | ICD-10-CM

## 2014-10-07 DIAGNOSIS — R079 Chest pain, unspecified: Secondary | ICD-10-CM | POA: Insufficient documentation

## 2014-10-07 LAB — COMPREHENSIVE METABOLIC PANEL
ALK PHOS: 54 U/L (ref 39–117)
ALT: 11 U/L (ref 0–53)
AST: 15 U/L (ref 0–37)
Albumin: 4.3 g/dL (ref 3.5–5.2)
BILIRUBIN TOTAL: 1.5 mg/dL — AB (ref 0.2–1.2)
BUN: 13 mg/dL (ref 6–23)
CALCIUM: 9.5 mg/dL (ref 8.4–10.5)
CO2: 29 meq/L (ref 19–32)
CREATININE: 1.03 mg/dL (ref 0.40–1.50)
Chloride: 104 mEq/L (ref 96–112)
GFR: 86.47 mL/min (ref >60.00)
GLUCOSE: 96 mg/dL (ref 70–99)
Potassium: 4.1 mEq/L (ref 3.5–5.1)
Sodium: 137 mEq/L (ref 135–145)
Total Protein: 7 g/dL (ref 6.0–8.3)

## 2014-10-07 LAB — LIPID PANEL
Cholesterol: 158 mg/dL (ref 0–200)
HDL: 38.6 mg/dL — ABNORMAL LOW (ref >39.00)
LDL Cholesterol: 106 mg/dL — ABNORMAL HIGH (ref 0–99)
NonHDL: 119.4
Total CHOL/HDL Ratio: 4
Triglycerides: 68 mg/dL (ref 0.0–149.0)
VLDL: 13.6 mg/dL (ref 0.0–40.0)

## 2014-10-07 LAB — TROPONIN I: TNIDX: 0.01 ug/l (ref 0.00–0.06)

## 2014-10-07 MED ORDER — VENLAFAXINE HCL ER 37.5 MG PO CP24: 37.5000 mg | ORAL_CAPSULE | Freq: Every day | ORAL | Status: DC

## 2014-10-07 MED ORDER — CLONAZEPAM 0.5 MG PO TABS: 0.5000 mg | ORAL_TABLET | Freq: Three times a day (TID) | ORAL | Status: DC | PRN

## 2014-10-07 NOTE — Progress Notes (Signed)
Pre visit review using our clinic review tool, if applicable. No additional management support is needed unless otherwise documented below in the visit note. 

## 2014-10-07 NOTE — Assessment & Plan Note (Addendum)
Symptoms are concerning for angina. Strong family history of CAD at early age. EKG today normal. Cardiology evaluation. Labs today including lipid profile.

## 2014-10-07 NOTE — Assessment & Plan Note (Signed)
Anxiety and depressed mood worsening recently with ongoing marital discord and pending divorce. Offered support today. Will add Effexor. Follow up in 4 weeks and prn.

## 2014-10-07 NOTE — Patient Instructions (Signed)
Start Effexor 37.5mg  daily every morning to help with anxiety and depression.  Continue Clonazepam.  We will set up a cardiology evaluation with Dr. Fletcher Anon.

## 2014-10-07 NOTE — Progress Notes (Signed)
Subjective:    Patient ID: Sergio Garcia, male    DOB: Apr 09, 1976, 39 y.o.   MRN: [id number]  HPI  39YO male presents for acute visit.  Feeling more anxious and depressed recently. Continues to have ongoing battle with ex-wife regarding separation.  Dyspnea and chest pain - having some dyspnea with exertion over last few months. Feels dizzy and sweaty with exertion. Some chest tightness and pain during episodes. At times, symptoms are severe, "brings him to his knees". Resolves in a few minutes with rest.  Past medical, surgical, family and social history per today's encounter.  Review of Systems  Constitutional: Negative for fever, chills, activity change, appetite change, fatigue and unexpected weight change.  Eyes: Negative for visual disturbance.  Respiratory: Positive for shortness of breath. Negative for cough.   Cardiovascular: Positive for chest pain. Negative for palpitations and leg swelling.  Gastrointestinal: Negative for nausea, abdominal pain, diarrhea, constipation and abdominal distention.  Genitourinary: Negative for dysuria, urgency and difficulty urinating.  Musculoskeletal: Negative for arthralgias and gait problem.  Skin: Negative for color change and rash.  Hematological: Negative for adenopathy.  Psychiatric/Behavioral: Positive for dysphoric mood. Negative for sleep disturbance. The patient is nervous/anxious.        Objective:    BP 108/70 mmHg  Pulse 75  Temp(Src) 98.1 F (36.7 C) (Oral)  Ht 6' (1.829 m)  Wt 182 lb 6 oz (82.725 kg)  BMI 24.73 kg/m2  SpO2 98% Physical Exam  Constitutional: He is oriented to person, place, and time. He appears well-developed and well-nourished. No distress.  HENT:  Head: Normocephalic and atraumatic.  Right Ear: External ear normal.  Left Ear: External ear normal.  Nose: Nose normal.  Mouth/Throat: Oropharynx is clear and moist. No oropharyngeal exudate.  Eyes: Conjunctivae and EOM are normal. Pupils are equal,  round, and reactive to light. Right eye exhibits no discharge. Left eye exhibits no discharge. No scleral icterus.  Neck: Normal range of motion. Neck supple. No tracheal deviation present. No thyromegaly present.  Cardiovascular: Normal rate, regular rhythm and normal heart sounds.  Exam reveals no gallop and no friction rub.   No murmur heard. Pulmonary/Chest: Effort normal and breath sounds normal. No accessory muscle usage. No tachypnea. No respiratory distress. He has no decreased breath sounds. He has no wheezes. He has no rhonchi. He has no rales. He exhibits no tenderness.  Musculoskeletal: Normal range of motion. He exhibits no edema.  Lymphadenopathy:    He has no cervical adenopathy.  Neurological: He is alert and oriented to person, place, and time. No cranial nerve deficit. Coordination normal.  Skin: Skin is warm and dry. No rash noted. He is not diaphoretic. No erythema. No pallor.  Psychiatric: His speech is normal and behavior is normal. Judgment and thought content normal. His mood appears anxious. Cognition and memory are normal. He exhibits a depressed mood. He expresses no suicidal ideation.          Assessment & Plan:   Problem List Items Addressed This Visit      Unprioritized   Adjustment disorder with mixed anxiety and depressed mood    Anxiety and depressed mood worsening recently with ongoing marital discord and pending divorce. Offered support today. Will add Effexor. Follow up in 4 weeks and prn.      Relevant Medications   clonazePAM (KLONOPIN) 0.5 MG tablet   Generalized anxiety disorder - Primary    Worsening symptoms of anxiety. Will start Effexor 37.5mg  daily. Continue prn Clonazepam.  Follow up recheck in 4 weeks and prn.       Relevant Medications   venlafaxine XR (EFFEXOR XR) 37.5 MG 24 hr capsule   Pain in the chest    Symptoms are concerning for angina. Strong family history of CAD at early age. EKG today normal. Cardiology evaluation. Labs  today including lipid profile.      Relevant Orders   Ambulatory referral to Cardiology   EKG 12-Lead (Completed)   Comprehensive metabolic panel   Troponin I   Lipid panel   Lipoprotein A (LPA)       Return in about 4 weeks (around 11/04/2014) for Recheck.

## 2014-10-07 NOTE — Assessment & Plan Note (Signed)
Worsening symptoms of anxiety. Will start Effexor 37.5mg  daily. Continue prn Clonazepam. Follow up recheck in 4 weeks and prn.

## 2014-10-08 LAB — LIPOPROTEIN A (LPA)

## 2014-11-11 ENCOUNTER — Ambulatory Visit: Payer: Self-pay | Admitting: Cardiovascular Disease

## 2014-11-11 ENCOUNTER — Encounter: Payer: Self-pay | Admitting: *Deleted

## 2014-11-11 ENCOUNTER — Ambulatory Visit: Payer: Self-pay | Admitting: Internal Medicine

## 2014-12-04 ENCOUNTER — Encounter: Payer: Self-pay | Admitting: Internal Medicine

## 2014-12-04 ENCOUNTER — Ambulatory Visit (INDEPENDENT_AMBULATORY_CARE_PROVIDER_SITE_OTHER): Payer: BC Managed Care – PPO | Admitting: Internal Medicine

## 2014-12-04 VITALS — BP 106/72 | HR 67 | Temp 98.1°F | Ht 72.0 in | Wt 175.2 lb

## 2014-12-04 DIAGNOSIS — F411 Generalized anxiety disorder: Secondary | ICD-10-CM

## 2014-12-04 NOTE — Progress Notes (Signed)
   Subjective:    Patient ID: Sergio Garcia, male    DOB: [birth year], 39 y.o.   MRN: [id number]  HPI  39YO male presents for follow up.  Continues in custody battle with wife.  Looking for a new job. Feels that Effexor and Clonazepam are helping with symptoms of anxiety.  Past medical, surgical, family and social history per today's encounter.  Review of Systems  Constitutional: Negative for fever, chills, activity change, appetite change, fatigue and unexpected weight change.  Eyes: Negative for visual disturbance.  Respiratory: Negative for cough and shortness of breath.   Cardiovascular: Negative for chest pain, palpitations and leg swelling.  Gastrointestinal: Negative for abdominal pain and abdominal distention.  Genitourinary: Negative for dysuria, urgency and difficulty urinating.  Musculoskeletal: Negative for arthralgias and gait problem.  Skin: Negative for color change and rash.  Hematological: Negative for adenopathy.  Psychiatric/Behavioral: Negative for sleep disturbance and dysphoric mood. The patient is not nervous/anxious.        Objective:    BP 106/72 mmHg  Pulse 67  Temp(Src) 98.1 F (36.7 C) (Oral)  Ht 6' (1.829 m)  Wt 175 lb 4 oz (79.493 kg)  BMI 23.76 kg/m2  SpO2 96% Physical Exam  Constitutional: He is oriented to person, place, and time. He appears well-developed and well-nourished. No distress.  HENT:  Head: Normocephalic and atraumatic.  Right Ear: External ear normal.  Left Ear: External ear normal.  Nose: Nose normal.  Mouth/Throat: Oropharynx is clear and moist. No oropharyngeal exudate.  Eyes: Conjunctivae and EOM are normal. Pupils are equal, round, and reactive to light. Right eye exhibits no discharge. Left eye exhibits no discharge. No scleral icterus.  Neck: Normal range of motion. Neck supple. No tracheal deviation present. No thyromegaly present.  Cardiovascular: Normal rate, regular rhythm and normal heart sounds.  Exam reveals no  gallop and no friction rub.   No murmur heard. Pulmonary/Chest: Effort normal and breath sounds normal. No accessory muscle usage. No tachypnea. No respiratory distress. He has no decreased breath sounds. He has no wheezes. He has no rhonchi. He has no rales. He exhibits no tenderness.  Musculoskeletal: Normal range of motion. He exhibits no edema.  Lymphadenopathy:    He has no cervical adenopathy.  Neurological: He is alert and oriented to person, place, and time. No cranial nerve deficit. Coordination normal.  Skin: Skin is warm and dry. No rash noted. He is not diaphoretic. No erythema. No pallor.  Psychiatric: He has a normal mood and affect. His behavior is normal. Judgment and thought content normal.          Assessment & Plan:   Problem List Items Addressed This Visit      Unprioritized   Generalized anxiety disorder - Primary    Symptoms well controlled with use of Effexor and prn Clonazepam. Will continue. Follow up in 6 months or sooner as needed.          Return in about 6 months (around 06/06/2015) for Physical.

## 2014-12-04 NOTE — Patient Instructions (Signed)
Continue current medications    Follow up in 6 months

## 2014-12-04 NOTE — Assessment & Plan Note (Signed)
Symptoms well controlled with use of Effexor and prn Clonazepam. Will continue. Follow up in 6 months or sooner as needed.

## 2014-12-04 NOTE — Progress Notes (Signed)
Pre visit review using our clinic review tool, if applicable. No additional management support is needed unless otherwise documented below in the visit note. 

## 2015-01-22 ENCOUNTER — Encounter: Payer: Self-pay | Admitting: Internal Medicine

## 2015-01-22 ENCOUNTER — Ambulatory Visit (INDEPENDENT_AMBULATORY_CARE_PROVIDER_SITE_OTHER): Payer: BC Managed Care – PPO | Admitting: Internal Medicine

## 2015-01-22 VITALS — BP 122/70 | HR 65 | Temp 98.5°F | Resp 18 | Wt 176.0 lb

## 2015-01-22 DIAGNOSIS — F4323 Adjustment disorder with mixed anxiety and depressed mood: Secondary | ICD-10-CM

## 2015-01-22 MED ORDER — CLONAZEPAM 0.5 MG PO TABS: 0.5000 mg | ORAL_TABLET | Freq: Three times a day (TID) | ORAL | Status: DC | PRN

## 2015-01-22 MED ORDER — BUPROPION HCL ER (XL) 150 MG PO TB24: 150.0000 mg | ORAL_TABLET | Freq: Every day | ORAL | Status: DC

## 2015-01-22 NOTE — Assessment & Plan Note (Signed)
Offered support today. Will start Wellbutrin 150mg  daily. Continue prn Clonazepam. Continue counseling. Follow up 4 weeks and prn.

## 2015-01-22 NOTE — Progress Notes (Signed)
Subjective:    Patient ID: Sergio Garcia, male    DOB: [birth year], 39 y.o.   MRN: [id number]  HPI  39YO male presents for acute visit.  Step son killed in car crash last week. Very difficult time for family. Counseling through pastor in place. Remer Macho was last week. Strong support from family and friends. Would like to start back on Wellbutrin.   Past Medical History  Diagnosis Date  . Depression   . Anxiety   . GERD (gastroesophageal reflux disease)   . Insomnia   . Gastritis   . Gilbert's syndrome     hyperbilirubinemia  . Prostatitis 2008  . Degenerative disc disease   . IBS (irritable bowel syndrome)   . Enteritis   . Inguinal hernia    Family History  Problem Relation Age of Onset  . Colon polyps Mother   . Heart disease Father   . Heart attack Father     x 2  . Pancreatic cancer Maternal Grandfather    Past Surgical History  Procedure Laterality Date  . Esophagogastroduodenoscopy  09/2004    gastritis acute and duodenitis  . Back surgery      2 ruptured discs  . Exploratory laparotomy  Jan 2015    Dr Burt Knack  . Esophagogastroduodenoscopy  01-14-14    Dr Allen Norris  . Cholecystectomy  01/31/14   Social History   Social History  . Marital Status: Married    Spouse Name: N/A  . Number of Children: 2  . Years of Education: N/A   Occupational History  . 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 Smoking status: Never Smoker   . Smokeless tobacco: Never Used  . Alcohol Use: No  . Drug Use: No  . Sexual Activity: Not Asked   Other Topics Concern  . None   Social History Narrative   Lives in [place] with wife 2 children. Dog.  Works at [employer].      Regular Exercise -  Walk daily   Daily Caffeine Use:  Mt Dew - 4 to 5 20 oz bottles daily             Review of Systems  Constitutional: Negative for fever, chills, activity change, appetite change, fatigue and unexpected weight change.  Eyes: Negative for visual  disturbance.  Respiratory: Negative for cough and shortness of breath.   Cardiovascular: Negative for chest pain, palpitations and leg swelling.  Gastrointestinal: Negative for abdominal pain and abdominal distention.  Genitourinary: Negative for dysuria, urgency and difficulty urinating.  Musculoskeletal: Negative for arthralgias and gait problem.  Skin: Negative for color change and rash.  Hematological: Negative for adenopathy.  Psychiatric/Behavioral: Positive for sleep disturbance and dysphoric mood. Negative for suicidal ideas. The patient is not nervous/anxious.        Objective:    BP 122/70 mmHg  Pulse 65  Temp(Src) 98.5 F (36.9 C) (Oral)  Resp 18  Wt 176 lb (79.833 kg)  SpO2 97% Physical Exam  Constitutional: He is oriented to person, place, and time. He appears well-developed and well-nourished. No distress.  HENT:  Head: Normocephalic and atraumatic.  Right Ear: External ear normal.  Left Ear: External ear normal.  Eyes: Conjunctivae and EOM are normal. Pupils are equal, round, and reactive to light.  Pulmonary/Chest: Effort normal.  Neurological: He is alert and oriented to person, place, and time. No cranial nerve deficit. He exhibits normal muscle tone. Coordination normal.  Skin: Skin is warm  and dry. He is not diaphoretic.  Psychiatric: His speech is normal and behavior is normal. Judgment and thought content normal. His mood appears anxious. Cognition and memory are normal. He exhibits a depressed mood. He expresses no suicidal ideation.          Assessment & Plan:   Problem List Items Addressed This Visit      Unprioritized   Adjustment disorder with mixed anxiety and depressed mood - Primary    Offered support today. Will start Wellbutrin 150mg  daily. Continue prn Clonazepam. Continue counseling. Follow up 4 weeks and prn.      Relevant Medications   clonazePAM (KLONOPIN) 0.5 MG tablet       Return in about 4 weeks (around 02/19/2015) for  Recheck.

## 2015-01-22 NOTE — Progress Notes (Signed)
Pre visit review using our clinic review tool, if applicable. No additional management support is needed unless otherwise documented below in the visit note. 

## 2015-01-22 NOTE — Patient Instructions (Signed)
Start Wellbutrin 150mg  daily.  Use Clonazepam as needed for severe anxiety.  Follow up in 4 weeks.

## 2015-01-29 ENCOUNTER — Ambulatory Visit: Payer: Self-pay

## 2015-01-29 ENCOUNTER — Other Ambulatory Visit: Payer: Self-pay | Admitting: Occupational Medicine

## 2015-01-29 DIAGNOSIS — M79672 Pain in left foot: Secondary | ICD-10-CM

## 2015-02-16 ENCOUNTER — Other Ambulatory Visit: Payer: Self-pay

## 2015-02-16 ENCOUNTER — Telehealth: Payer: Self-pay | Admitting: Internal Medicine

## 2015-02-16 ENCOUNTER — Ambulatory Visit (INDEPENDENT_AMBULATORY_CARE_PROVIDER_SITE_OTHER): Payer: BC Managed Care – PPO

## 2015-02-16 DIAGNOSIS — F4323 Adjustment disorder with mixed anxiety and depressed mood: Secondary | ICD-10-CM

## 2015-02-16 DIAGNOSIS — Z23 Encounter for immunization: Secondary | ICD-10-CM | POA: Diagnosis not present

## 2015-02-16 MED ORDER — CLONAZEPAM 0.5 MG PO TABS: 0.5000 mg | ORAL_TABLET | Freq: Three times a day (TID) | ORAL | Status: DC | PRN

## 2015-02-21 IMAGING — CR DG ABDOMEN 2V
1 series · 3 of 3 positions shown · non-contrast
Comparison: 05/26/2013.  05/25/2013.  CT scan from 05/24/2013.

CLINICAL DATA: Partial small bowel obstruction.

EXAM:
ABDOMEN - 2 VIEW

[Series 8: w abdomen upright · 0.14mm/px · 3 of 3 slices shown]
[im 1/3]
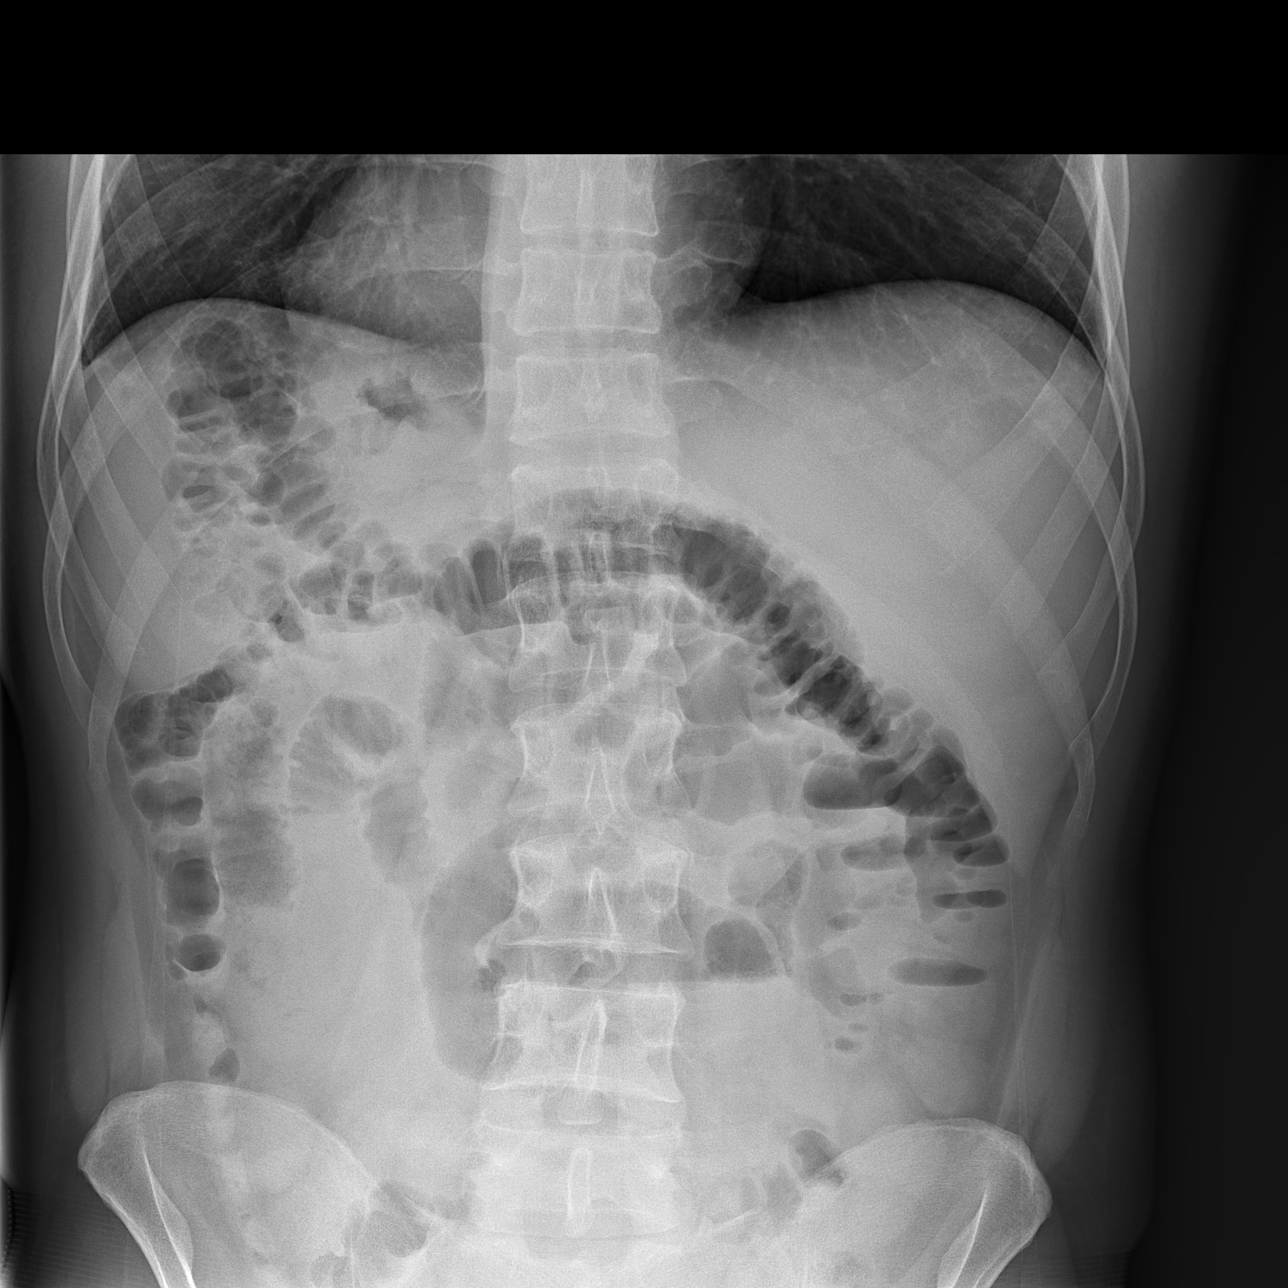
[im 2/3]
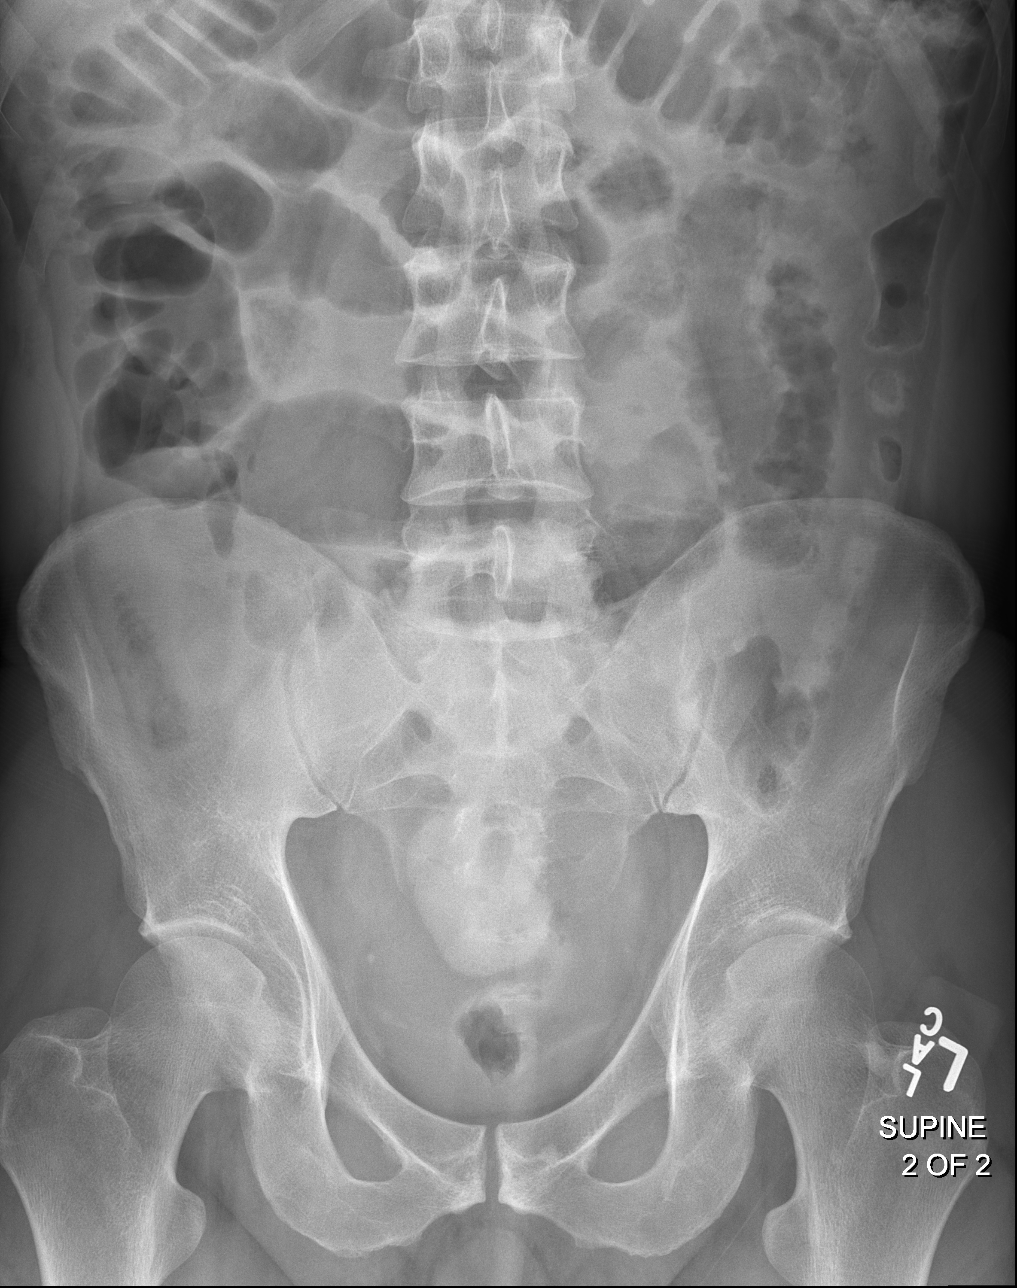
[im 3/3]
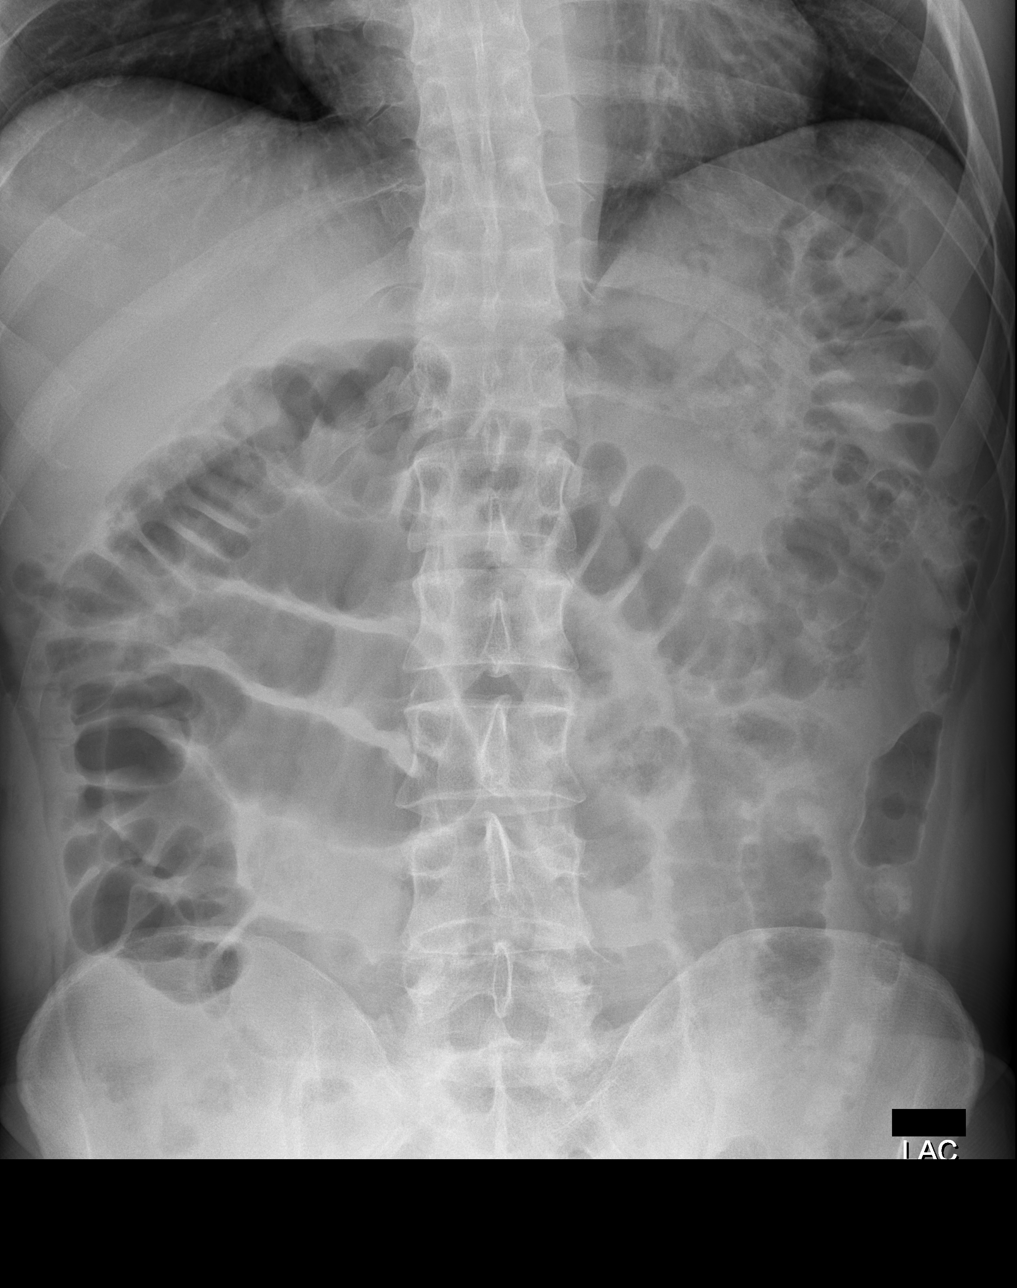

[3 of 3 positions shown; findings below may reference images not displayed]

FINDINGS: Upright film shows no evidence for intraperitoneal free air.

Supine film shows interval decrease in gaseous small bowel
distention although small bowel loops do remain diffusely
air-filled. Small bowel in the right abdomen measures up to 3.6 cm
in diameter. There is gas seen along the length of a nondilated
colon.

Visualized bony structures are unremarkable. Phleboliths are noted
over the anatomic pelvis bilaterally.
IMPRESSION: Although small bowel remains diffusely distended, the degree of
small bowel dilatation has decreased in the interval. Gas remains
visible along the length of the colon as well, which is nondilated.

## 2015-02-23 ENCOUNTER — Ambulatory Visit (INDEPENDENT_AMBULATORY_CARE_PROVIDER_SITE_OTHER): Payer: BC Managed Care – PPO | Admitting: Internal Medicine

## 2015-02-23 ENCOUNTER — Encounter: Payer: Self-pay | Admitting: Internal Medicine

## 2015-02-23 VITALS — BP 112/70 | HR 74 | Temp 98.3°F | Ht 72.0 in | Wt 169.4 lb

## 2015-02-23 DIAGNOSIS — F4323 Adjustment disorder with mixed anxiety and depressed mood: Secondary | ICD-10-CM

## 2015-02-23 DIAGNOSIS — S92352A Displaced fracture of fifth metatarsal bone, left foot, initial encounter for closed fracture: Secondary | ICD-10-CM | POA: Insufficient documentation

## 2015-02-23 DIAGNOSIS — S92352D Displaced fracture of fifth metatarsal bone, left foot, subsequent encounter for fracture with routine healing: Secondary | ICD-10-CM | POA: Diagnosis not present

## 2015-02-23 MED ORDER — GLYCOPYRROLATE 1 MG PO TABS: 1.0000 mg | ORAL_TABLET | Freq: Two times a day (BID) | ORAL | Status: DC | PRN

## 2015-02-23 MED ORDER — BUPROPION HCL ER (XL) 150 MG PO TB24: 300.0000 mg | ORAL_TABLET | Freq: Every day | ORAL | Status: DC

## 2015-02-23 NOTE — Assessment & Plan Note (Signed)
Symptoms improved with Wellbutrin 300mg  daily and prn Clonazepam. Will continue. Follow up in 3 months and prn.

## 2015-02-23 NOTE — Patient Instructions (Signed)
Continue Wellbutrin 300mg  daily.  Continue Clonazepam as needed.  Follow up in 3 months.

## 2015-02-23 NOTE — Progress Notes (Signed)
Subjective:    Patient ID: Sergio Garcia, male    DOB: [birth year], 39 y.o.   MRN: [id number]  HPI  39YO male presents for follow up.  Anxiety - Increased Wellbutrin on his own to 300mg  daily. Tolerating well. Taking Clonazepam as needed. Symptoms well controlled. Also getting counseling with a local [facility].  Left 5th metatarsal fracture - Twisted foot while stepping off a bus at work. Has follow up with Ortho this week. Continues to have pain and swelling at the site. Wearing a solid boot. Not taking anything for pain except for occasional ibuprofen.  Wt Readings from Last 3 Encounters:  02/23/15 169 lb 6 oz (76.828 kg)  01/22/15 176 lb (79.833 kg)  12/04/14 175 lb 4 oz (79.493 kg)   BP Readings from Last 3 Encounters:  02/23/15 112/70  01/22/15 122/70  12/04/14 106/72    Past Medical History  Diagnosis Date  . Depression   . Anxiety   . GERD (gastroesophageal reflux disease)   . Insomnia   . Gastritis   . Gilbert's syndrome     hyperbilirubinemia  . Prostatitis 2008  . Degenerative disc disease   . IBS (irritable bowel syndrome)   . Enteritis   . Inguinal hernia    Family History  Problem Relation Age of Onset  . Colon polyps Mother   . Heart disease Father   . Heart attack Father     x 2  . Pancreatic cancer Maternal Grandfather    Past Surgical History  Procedure Laterality Date  . Esophagogastroduodenoscopy  09/2004    gastritis acute and duodenitis  . Back surgery      2 ruptured discs  . Exploratory laparotomy  Jan 2015    Dr Burt Knack  . Esophagogastroduodenoscopy  01-14-14    Dr Allen Norris  . Cholecystectomy  01/31/14   Social History   Social History  . Marital Status: Married    Spouse Name: N/A  . Number of Children: 2  . Years of Education: N/A   Occupational History  . 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 Smoking status: Never Smoker   . Smokeless tobacco: Never Used  . Alcohol Use: No  . Drug Use: No  . Sexual  Activity: Not Asked   Other Topics Concern  . None   Social History Narrative   Lives in [place] with wife 2 children. Dog.  Works at [employer].      Regular Exercise -  Walk daily   Daily Caffeine Use:  Mt Dew - 4 to 5 20 oz bottles daily             Review of Systems  Constitutional: Negative for fever, chills, activity change, appetite change, fatigue and unexpected weight change.  Eyes: Negative for visual disturbance.  Respiratory: Negative for cough and shortness of breath.   Cardiovascular: Negative for chest pain, palpitations and leg swelling.  Gastrointestinal: Negative for abdominal pain, diarrhea, constipation and abdominal distention.  Genitourinary: Negative for dysuria, urgency and difficulty urinating.  Musculoskeletal: Positive for joint swelling and arthralgias. Negative for gait problem.  Skin: Negative for color change and rash.  Hematological: Negative for adenopathy.  Psychiatric/Behavioral: Positive for dysphoric mood. Negative for suicidal ideas and sleep disturbance. The patient is nervous/anxious.        Objective:    BP 112/70 mmHg  Pulse 74  Temp(Src) 98.3 F (36.8 C) (Oral)  Ht 6' (1.829 m)  Wt 169 lb 6  oz (76.828 kg)  BMI 22.97 kg/m2  SpO2 96% Physical Exam  Constitutional: He is oriented to person, place, and time. He appears well-developed and well-nourished. No distress.  HENT:  Head: Normocephalic and atraumatic.  Right Ear: External ear normal.  Left Ear: External ear normal.  Nose: Nose normal.  Mouth/Throat: Oropharynx is clear and moist. No oropharyngeal exudate.  Eyes: Conjunctivae and EOM are normal. Pupils are equal, round, and reactive to light. Right eye exhibits no discharge. Left eye exhibits no discharge. No scleral icterus.  Neck: Normal range of motion. Neck supple. No tracheal deviation present. No thyromegaly present.  Cardiovascular: Normal rate, regular rhythm and normal heart sounds.  Exam reveals no  gallop and no friction rub.   No murmur heard. Pulmonary/Chest: Effort normal and breath sounds normal. No accessory muscle usage. No tachypnea. No respiratory distress. He has no decreased breath sounds. He has no wheezes. He has no rhonchi. He has no rales. He exhibits no tenderness.  Musculoskeletal: He exhibits no edema.       Left foot: There is decreased range of motion, tenderness, bony tenderness and swelling.       Feet:  Lymphadenopathy:    He has no cervical adenopathy.  Neurological: He is alert and oriented to person, place, and time. No cranial nerve deficit. Coordination normal.  Skin: Skin is warm and dry. No rash noted. He is not diaphoretic. No erythema. No pallor.  Psychiatric: His speech is normal and behavior is normal. Judgment and thought content normal. His mood appears anxious. Cognition and memory are normal. He exhibits a depressed mood.          Assessment & Plan:   Problem List Items Addressed This Visit      Unprioritized   Adjustment disorder with mixed anxiety and depressed mood - Primary    Symptoms improved with Wellbutrin 300mg  daily and prn Clonazepam. Will continue. Follow up in 3 months and prn.      Fracture of fifth metatarsal bone of left foot    Reviewed xrays with pt. Encouraged him to follow up with ortho as scheduled. Continue Ibuprofen prn.          Return in about 3 months (around 05/26/2015) for Recheck.

## 2015-02-23 NOTE — Progress Notes (Signed)
Pre visit review using our clinic review tool, if applicable. No additional management support is needed unless otherwise documented below in the visit note. 

## 2015-02-23 NOTE — Assessment & Plan Note (Signed)
Reviewed xrays with pt. Encouraged him to follow up with ortho as scheduled. Continue Ibuprofen prn.

## 2015-03-18 ENCOUNTER — Encounter: Payer: Self-pay | Admitting: Internal Medicine

## 2015-04-26 ENCOUNTER — Other Ambulatory Visit: Payer: Self-pay | Admitting: Internal Medicine

## 2015-06-08 ENCOUNTER — Encounter: Payer: Self-pay | Admitting: Internal Medicine

## 2015-06-08 ENCOUNTER — Ambulatory Visit (INDEPENDENT_AMBULATORY_CARE_PROVIDER_SITE_OTHER): Payer: BC Managed Care – PPO | Admitting: Internal Medicine

## 2015-06-08 VITALS — BP 114/74 | HR 71 | Temp 97.9°F | Ht 73.25 in | Wt 173.1 lb

## 2015-06-08 DIAGNOSIS — Z0000 Encounter for general adult medical examination without abnormal findings: Secondary | ICD-10-CM | POA: Diagnosis not present

## 2015-06-08 DIAGNOSIS — M542 Cervicalgia: Secondary | ICD-10-CM | POA: Diagnosis not present

## 2015-06-08 LAB — COMPREHENSIVE METABOLIC PANEL
ALBUMIN: 4.3 g/dL (ref 3.5–5.2)
ALK PHOS: 78 U/L (ref 39–117)
ALT: 80 U/L — ABNORMAL HIGH (ref 0–53)
AST: 43 U/L — ABNORMAL HIGH (ref 0–37)
BILIRUBIN TOTAL: 1.1 mg/dL (ref 0.2–1.2)
BUN: 16 mg/dL (ref 6–23)
CALCIUM: 9.9 mg/dL (ref 8.4–10.5)
CO2: 26 mEq/L (ref 19–32)
Chloride: 103 mEq/L (ref 96–112)
Creatinine, Ser: 1.03 mg/dL (ref 0.40–1.50)
GFR: 86.15 mL/min (ref >60.00)
Glucose, Bld: 93 mg/dL (ref 70–99)
POTASSIUM: 4.9 meq/L (ref 3.5–5.1)
Sodium: 142 mEq/L (ref 135–145)
TOTAL PROTEIN: 7.4 g/dL (ref 6.0–8.3)

## 2015-06-08 LAB — CBC WITH DIFFERENTIAL/PLATELET
Basophils Absolute: 0 10*3/uL (ref 0.0–0.1)
Basophils Relative: 0.5 % (ref 0.0–3.0)
EOS PCT: 1.4 % (ref 0.0–5.0)
Eosinophils Absolute: 0.1 10*3/uL (ref 0.0–0.7)
HEMATOCRIT: 52.1 % — AB (ref 39.0–52.0)
HEMOGLOBIN: 17.2 g/dL — AB (ref 13.0–17.0)
LYMPHS ABS: 1.9 10*3/uL (ref 0.7–4.0)
Lymphocytes Relative: 31.4 % (ref 12.0–46.0)
MCHC: 33 g/dL (ref 30.0–36.0)
MCV: 92.8 fl (ref 78.0–100.0)
Monocytes Absolute: 0.7 10*3/uL (ref 0.1–1.0)
Monocytes Relative: 10.9 % (ref 3.0–12.0)
NEUTROS PCT: 55.8 % (ref 43.0–77.0)
Neutro Abs: 3.4 10*3/uL (ref 1.4–7.7)
Platelets: 274 10*3/uL (ref 150.0–400.0)
RBC: 5.61 Mil/uL (ref 4.22–5.81)
RDW: 13.1 % (ref 11.5–15.5)
WBC: 6.1 10*3/uL (ref 4.0–10.5)

## 2015-06-08 LAB — VITAMIN D 25 HYDROXY (VIT D DEFICIENCY, FRACTURES): VITD: 31.24 ng/mL (ref 30.00–100.00)

## 2015-06-08 LAB — LIPID PANEL
CHOLESTEROL: 130 mg/dL (ref 0–200)
HDL: 31.9 mg/dL — AB (ref >39.00)
LDL Cholesterol: 85 mg/dL (ref 0–99)
NonHDL: 97.76
TRIGLYCERIDES: 64 mg/dL (ref 0.0–149.0)
Total CHOL/HDL Ratio: 4
VLDL: 12.8 mg/dL (ref 0.0–40.0)

## 2015-06-08 LAB — TSH: TSH: 1.73 u[IU]/mL (ref 0.35–4.50)

## 2015-06-08 LAB — HEMOGLOBIN A1C: Hgb A1c MFr Bld: 5.7 % (ref 4.6–6.5)

## 2015-06-08 MED ORDER — CYCLOBENZAPRINE HCL 5 MG PO TABS: 5.0000 mg | ORAL_TABLET | Freq: Three times a day (TID) | ORAL | Status: DC | PRN

## 2015-06-08 NOTE — Assessment & Plan Note (Signed)
General medical exam normal today. Encouraged healthy diet, exercise. Discussed cancer screening. We discussed starting prostate cancer screening at age 40. Labs today. Immunizations are UTD. Follow up 1 year.

## 2015-06-08 NOTE — Progress Notes (Signed)
Pre visit review using our clinic review tool, if applicable. No additional management support is needed unless otherwise documented below in the visit note. 

## 2015-06-08 NOTE — Patient Instructions (Addendum)
Start Flexeril 5mg  up to three times daily as needed for neck pain. Note this medication can make you drowsy.  Health Maintenance, Male A healthy lifestyle and preventative care can promote health and wellness.  Maintain regular health, dental, and eye exams.  Eat a healthy diet. Foods like vegetables, fruits, whole grains, low-fat dairy products, and lean protein foods contain the nutrients you need and are low in calories. Decrease your intake of foods high in solid fats, added sugars, and salt. Get information about a proper diet from your health care provider, if necessary.  Regular physical exercise is one of the most important things you can do for your health. Most adults should get at least 150 minutes of moderate-intensity exercise (any activity that increases your heart rate and causes you to sweat) each week. In addition, most adults need muscle-strengthening exercises on 2 or more days a week.   Maintain a healthy weight. The body mass index (BMI) is a screening tool to identify possible weight problems. It provides an estimate of body fat based on height and weight. Your health care provider can find your BMI and can help you achieve or maintain a healthy weight. For males 20 years and older:  A BMI below 18.5 is considered underweight.  A BMI of 18.5 to 24.9 is normal.  A BMI of 25 to 29.9 is considered overweight.  A BMI of 30 and above is considered obese.  Maintain normal blood lipids and cholesterol by exercising and minimizing your intake of saturated fat. Eat a balanced diet with plenty of fruits and vegetables. Blood tests for lipids and cholesterol should begin at age 18 and be repeated every 5 years. If your lipid or cholesterol levels are high, you are over age 59, or you are at high risk for heart disease, you may need your cholesterol levels checked more frequently.Ongoing high lipid and cholesterol levels should be treated with medicines if diet and exercise are not  working.  If you smoke, find out from your health care provider how to quit. If you do not use tobacco, do not start.  Lung cancer screening is recommended for adults aged 38-80 years who are at high risk for developing lung cancer because of a history of smoking. A yearly low-dose CT scan of the lungs is recommended for people who have at least a 30-pack-year history of smoking and are current smokers or have quit within the past 15 years. A pack year of smoking is smoking an average of 1 pack of cigarettes a day for 1 year (for example, a 30-pack-year history of smoking could mean smoking 1 pack a day for 30 years or 2 packs a day for 15 years). Yearly screening should continue until the smoker has stopped smoking for at least 15 years. Yearly screening should be stopped for people who develop a health problem that would prevent them from having lung cancer treatment.  If you choose to drink alcohol, do not have more than 2 drinks per day. One drink is considered to be 12 oz (360 mL) of beer, 5 oz (150 mL) of wine, or 1.5 oz (45 mL) of liquor.  Avoid the use of street drugs. Do not share needles with anyone. Ask for help if you need support or instructions about stopping the use of drugs.  High blood pressure causes heart disease and increases the risk of stroke. High blood pressure is more likely to develop in:  People who have blood pressure in the end  of the normal range (100-139/85-89 mm Hg).  People who are overweight or obese.  People who are African American.  If you are 41-30 years of age, have your blood pressure checked every 3-5 years. If you are 73 years of age or older, have your blood pressure checked every year. You should have your blood pressure measured twice--once when you are at a hospital or clinic, and once when you are not at a hospital or clinic. Record the average of the two measurements. To check your blood pressure when you are not at a hospital or clinic, you can  use:  An automated blood pressure machine at a pharmacy.  A home blood pressure monitor.  If you are 45-10 years old, ask your health care provider if you should take aspirin to prevent heart disease.  Diabetes screening involves taking a blood sample to check your fasting blood sugar level. This should be done once every 3 years after age 66 if you are at a normal weight and without risk factors for diabetes. Testing should be considered at a younger age or be carried out more frequently if you are overweight and have at least 1 risk factor for diabetes.  Colorectal cancer can be detected and often prevented. Most routine colorectal cancer screening begins at the age of 57 and continues through age 51. However, your health care provider may recommend screening at an earlier age if you have risk factors for colon cancer. On a yearly basis, your health care provider may provide home test kits to check for hidden blood in the stool. A small camera at the end of a tube may be used to directly examine the colon (sigmoidoscopy or colonoscopy) to detect the earliest forms of colorectal cancer. Talk to your health care provider about this at age 52 when routine screening begins. A direct exam of the colon should be repeated every 5-10 years through age 2, unless early forms of precancerous polyps or small growths are found.  People who are at an increased risk for hepatitis B should be screened for this virus. You are considered at high risk for hepatitis B if:  You were born in a country where hepatitis B occurs often. Talk with your health care provider about which countries are considered high risk.  Your parents were born in a high-risk country and you have not received a shot to protect against hepatitis B (hepatitis B vaccine).  You have HIV or AIDS.  You use needles to inject street drugs.  You live with, or have sex with, someone who has hepatitis B.  You are a man who has sex with other  men (MSM).  You get hemodialysis treatment.  You take certain medicines for conditions like cancer, organ transplantation, and autoimmune conditions.  Hepatitis C blood testing is recommended for all people born from [birth year] through 1963 and any individual with known risk factors for hepatitis C.  Healthy men should no longer receive prostate-specific antigen (PSA) blood tests as part of routine cancer screening. Talk to your health care provider about prostate cancer screening.  Testicular cancer screening is not recommended for adolescents or adult males who have no symptoms. Screening includes self-exam, a health care provider exam, and other screening tests. Consult with your health care provider about any symptoms you have or any concerns you have about testicular cancer.  Practice safe sex. Use condoms and avoid high-risk sexual practices to reduce the spread of sexually transmitted infections (STIs).  You should be  screened for STIs, including gonorrhea and chlamydia if:  You are sexually active and are younger than 24 years.  You are older than 24 years, and your health care provider tells you that you are at risk for this type of infection.  Your sexual activity has changed since you were last screened, and you are at an increased risk for chlamydia or gonorrhea. Ask your health care provider if you are at risk.  If you are at risk of being infected with HIV, it is recommended that you take a prescription medicine daily to prevent HIV infection. This is called pre-exposure prophylaxis (PrEP). You are considered at risk if:  You are a man who has sex with other men (MSM).  You are a heterosexual man who is sexually active with multiple partners.  You take drugs by injection.  You are sexually active with a partner who has HIV.  Talk with your health care provider about whether you are at high risk of being infected with HIV. If you choose to begin PrEP, you should first be tested  for HIV. You should then be tested every 3 months for as long as you are taking PrEP.  Use sunscreen. Apply sunscreen liberally and repeatedly throughout the day. You should seek shade when your shadow is shorter than you. Protect yourself by wearing long sleeves, pants, a wide-brimmed hat, and sunglasses year round whenever you are outdoors.  Tell your health care provider of new moles or changes in moles, especially if there is a change in shape or color. Also, tell your health care provider if a mole is larger than the size of a pencil eraser.  A one-time screening for abdominal aortic aneurysm (AAA) and surgical repair of large AAAs by ultrasound is recommended for men aged 34-75 years who are current or former smokers.  Stay current with your vaccines (immunizations).   This information is not intended to replace advice given to you by your health care provider. Make sure you discuss any questions you have with your health care provider.   Document Released: 11/05/2007 Document Revised: 05/30/2014 Document Reviewed: 10/04/2010 Elsevier Interactive Patient Education [employer].

## 2015-06-08 NOTE — Assessment & Plan Note (Signed)
Bilateral neck pain most consistent with trapezius spasm. Encouraged some exercises to stretch trapezius. Will add prn Flexeril. Discussed risk of sedation on this medication. Follow up prn.

## 2015-06-08 NOTE — Progress Notes (Signed)
Subjective:    Patient ID: Sergio Garcia, male    DOB: 1975/10/14, 41 y.o.   MRN: [id number]  HPI  40YO male presents for physical exam.  Feeling well except for some bilateral neck pain. This has been going on for 4 weeks. Described as aching. Used IcyHot with no improvement.No injury noted. Worse with prolonged computer use.   Aside from this, doing well.  Wt Readings from Last 3 Encounters:  06/08/15 173 lb 2 oz (78.529 kg)  02/23/15 169 lb 6 oz (76.828 kg)  01/22/15 176 lb (79.833 kg)   BP Readings from Last 3 Encounters:  06/08/15 114/74  02/23/15 112/70  01/22/15 122/70    Past Medical History  Diagnosis Date  . Depression   . Anxiety   . GERD (gastroesophageal reflux disease)   . Insomnia   . Gastritis   . Gilbert's syndrome     hyperbilirubinemia  . Prostatitis 2008  . Degenerative disc disease   . IBS (irritable bowel syndrome)   . Enteritis   . Inguinal hernia    Family History  Problem Relation Age of Onset  . Colon polyps Mother   . Cancer Mother     sinus cavity, on XRT  . Heart disease Father   . Heart attack Father     x 2  . Pancreatic cancer Maternal Grandfather    Past Surgical History  Procedure Laterality Date  . Esophagogastroduodenoscopy  09/2004    gastritis acute and duodenitis  . Back surgery      2 ruptured discs  . Exploratory laparotomy  Jan 2015    Dr Burt Knack  . Esophagogastroduodenoscopy  01-14-14    Dr Allen Norris  . Cholecystectomy  01/31/14   Social History   Social History  . Marital Status: Married    Spouse Name: N/A  . Number of Children: 2  . Years of Education: N/A   Occupational History  . 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 Smoking status: Never Smoker   . Smokeless tobacco: Never Used  . Alcohol Use: No  . Drug Use: No  . Sexual Activity: Not Asked   Other Topics Concern  . None   Social History Narrative   Lives in [place] with wife 2 children. Dog.  Works at [employer].      Regular Exercise -  Walk daily   Daily Caffeine Use:  Mt Dew - 4 to 5 20 oz bottles daily             Review of Systems  Constitutional: Negative for fever, chills, activity change, appetite change, fatigue and unexpected weight change.  Eyes: Negative for visual disturbance.  Respiratory: Negative for cough and shortness of breath.   Cardiovascular: Negative for chest pain, palpitations and leg swelling.  Gastrointestinal: Negative for nausea, vomiting, abdominal pain, diarrhea, constipation and abdominal distention.  Genitourinary: Negative for dysuria, urgency and difficulty urinating.  Musculoskeletal: Positive for myalgias, neck pain and neck stiffness. Negative for arthralgias and gait problem.  Skin: Negative for color change and rash.  Neurological: Negative for weakness.  Hematological: Negative for adenopathy.  Psychiatric/Behavioral: Negative for suicidal ideas, sleep disturbance and dysphoric mood. The patient is not nervous/anxious.        Objective:    BP 114/74 mmHg  Pulse 71  Temp(Src) 97.9 F (36.6 C) (Oral)  Ht 6' 1.25" (1.861 m)  Wt 173 lb 2 oz (78.529 kg)  BMI 22.67 kg/m2  SpO2  98% Physical Exam  Constitutional: He is oriented to person, place, and time. He appears well-developed and well-nourished. No distress.  HENT:  Head: Normocephalic and atraumatic.  Right Ear: External ear normal.  Left Ear: External ear normal.  Nose: Nose normal.  Mouth/Throat: Oropharynx is clear and moist. No oropharyngeal exudate.  Eyes: Conjunctivae and EOM are normal. Pupils are equal, round, and reactive to light. Right eye exhibits no discharge. Left eye exhibits no discharge. No scleral icterus.  Neck: Normal range of motion. Neck supple. No tracheal deviation present. No thyromegaly present.  Cardiovascular: Normal rate, regular rhythm and normal heart sounds.  Exam reveals no gallop and no friction rub.   No murmur heard. Pulmonary/Chest: Effort  normal and breath sounds normal. No respiratory distress. He has no wheezes. He has no rales. He exhibits no tenderness.  Abdominal: Soft. Bowel sounds are normal. He exhibits no distension and no mass. There is no tenderness. There is no rebound and no guarding.  Musculoskeletal: Normal range of motion. He exhibits no edema.       Cervical back: He exhibits tenderness (over trapezius bilaterally), pain and spasm. He exhibits normal range of motion.  Lymphadenopathy:    He has no cervical adenopathy.  Neurological: He is alert and oriented to person, place, and time. No cranial nerve deficit. Coordination normal.  Skin: Skin is warm and dry. No rash noted. He is not diaphoretic. No erythema. No pallor.  Psychiatric: He has a normal mood and affect. His behavior is normal. Judgment and thought content normal.          Assessment & Plan:   Problem List Items Addressed This Visit      Unprioritized   Neck pain, bilateral    Bilateral neck pain most consistent with trapezius spasm. Encouraged some exercises to stretch trapezius. Will add prn Flexeril. Discussed risk of sedation on this medication. Follow up prn.      Routine general medical examination at a health care facility - Primary    General medical exam normal today. Encouraged healthy diet, exercise. Discussed cancer screening. We discussed starting prostate cancer screening at age 85. Labs today. Immunizations are UTD. Follow up 1 year.      Relevant Orders   CBC with Differential/Platelet   Comprehensive metabolic panel   Lipid panel   Hemoglobin A1c   Microalbumin / creatinine urine ratio   VITAMIN D 25 Hydroxy (Vit-D Deficiency, Fractures)   TSH       Return in about 1 year (around 06/07/2016) for Physical.

## 2015-06-10 ENCOUNTER — Encounter: Payer: Self-pay | Admitting: Internal Medicine

## 2015-06-10 ENCOUNTER — Other Ambulatory Visit: Payer: Self-pay | Admitting: *Deleted

## 2015-06-10 MED ORDER — ONDANSETRON HCL 8 MG PO TABS: 8.0000 mg | ORAL_TABLET | Freq: Three times a day (TID) | ORAL | Status: DC | PRN

## 2015-07-08 ENCOUNTER — Other Ambulatory Visit: Payer: Self-pay | Admitting: Internal Medicine

## 2015-07-08 DIAGNOSIS — D751 Secondary polycythemia: Secondary | ICD-10-CM

## 2015-07-10 ENCOUNTER — Other Ambulatory Visit (INDEPENDENT_AMBULATORY_CARE_PROVIDER_SITE_OTHER): Payer: BC Managed Care – PPO

## 2015-07-10 DIAGNOSIS — D751 Secondary polycythemia: Secondary | ICD-10-CM

## 2015-07-10 LAB — COMPREHENSIVE METABOLIC PANEL
ALT: 23 U/L (ref 0–53)
AST: 16 U/L (ref 0–37)
Albumin: 4.5 g/dL (ref 3.5–5.2)
Alkaline Phosphatase: 60 U/L (ref 39–117)
BUN: 12 mg/dL (ref 6–23)
CHLORIDE: 103 meq/L (ref 96–112)
CO2: 33 meq/L — AB (ref 19–32)
CREATININE: 1 mg/dL (ref 0.40–1.50)
Calcium: 9.7 mg/dL (ref 8.4–10.5)
GFR: 89.1 mL/min (ref >60.00)
Glucose, Bld: 102 mg/dL — ABNORMAL HIGH (ref 70–99)
Potassium: 4.4 mEq/L (ref 3.5–5.1)
SODIUM: 141 meq/L (ref 135–145)
Total Bilirubin: 1.4 mg/dL — ABNORMAL HIGH (ref 0.2–1.2)
Total Protein: 7 g/dL (ref 6.0–8.3)

## 2015-07-10 LAB — CBC
HCT: 47.7 % (ref 39.0–52.0)
Hemoglobin: 16.1 g/dL (ref 13.0–17.0)
MCHC: 33.8 g/dL (ref 30.0–36.0)
MCV: 90.2 fl (ref 78.0–100.0)
Platelets: 260 10*3/uL (ref 150.0–400.0)
RBC: 5.29 Mil/uL (ref 4.22–5.81)
RDW: 12.7 % (ref 11.5–15.5)
WBC: 5.9 10*3/uL (ref 4.0–10.5)

## 2015-07-10 NOTE — Progress Notes (Signed)
Patient here today for lab draw only. 

## 2015-07-29 ENCOUNTER — Encounter: Payer: Self-pay | Admitting: Internal Medicine

## 2015-07-29 ENCOUNTER — Ambulatory Visit (INDEPENDENT_AMBULATORY_CARE_PROVIDER_SITE_OTHER): Payer: BC Managed Care – PPO | Admitting: Internal Medicine

## 2015-07-29 VITALS — BP 102/70 | HR 86 | Temp 98.6°F | Ht 73.25 in | Wt 178.0 lb

## 2015-07-29 DIAGNOSIS — L500 Allergic urticaria: Secondary | ICD-10-CM | POA: Diagnosis not present

## 2015-07-29 DIAGNOSIS — J020 Streptococcal pharyngitis: Secondary | ICD-10-CM | POA: Diagnosis not present

## 2015-07-29 LAB — POCT RAPID STREP A (OFFICE): RAPID STREP A SCREEN: POSITIVE — AB

## 2015-07-29 MED ORDER — AMOXICILLIN-POT CLAVULANATE 875-125 MG PO TABS
1.0000 | ORAL_TABLET | Freq: Two times a day (BID) | ORAL | Status: DC
Start: 2015-07-29 — End: 2015-08-21

## 2015-07-29 NOTE — Assessment & Plan Note (Signed)
Strep throat. Start Augmentin bid. Rest, fluids. PRN Ibuprofen. Follow up if symptoms not improving.

## 2015-07-29 NOTE — Progress Notes (Signed)
Pre visit review using our clinic review tool, if applicable. No additional management support is needed unless otherwise documented below in the visit note. 

## 2015-07-29 NOTE — Progress Notes (Signed)
Subjective:    Patient ID: Sergio Garcia, male    DOB: [birth year], 40 y.o.   MRN: [id number]  HPI  40YO male presents for acute visit.  Sore throat - Started Fri or Sat. Several sick contacts with Strep. Mild nasal congestion. No dyspnea. No fever, chills.  Itching rash with hives over last few months. Occurs mostly at night. No new lotions, soaps. Using benadryl or zyrtec with some improvement.  Wt Readings from Last 3 Encounters:  07/29/15 178 lb (80.74 kg)  06/08/15 173 lb 2 oz (78.529 kg)  02/23/15 169 lb 6 oz (76.828 kg)   BP Readings from Last 3 Encounters:  07/29/15 102/70  06/08/15 114/74  02/23/15 112/70    Past Medical History  Diagnosis Date  . Depression   . Anxiety   . GERD (gastroesophageal reflux disease)   . Insomnia   . Gastritis   . Gilbert's syndrome     hyperbilirubinemia  . Prostatitis 2008  . Degenerative disc disease   . IBS (irritable bowel syndrome)   . Enteritis   . Inguinal hernia    Family History  Problem Relation Age of Onset  . Colon polyps Mother   . Cancer Mother     sinus cavity, on XRT  . Heart disease Father   . Heart attack Father     x 2  . Pancreatic cancer Maternal Grandfather    Past Surgical History  Procedure Laterality Date  . Esophagogastroduodenoscopy  09/2004    gastritis acute and duodenitis  . Back surgery      2 ruptured discs  . Exploratory laparotomy  Jan 2015    Dr Burt Knack  . Esophagogastroduodenoscopy  01-14-14    Dr Allen Norris  . Cholecystectomy  01/31/14   Social History   Social History  . Marital Status: Married    Spouse Name: N/A  . Number of Children: 2  . Years of Education: N/A   Occupational History  . 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 Smoking status: Never Smoker   . Smokeless tobacco: Never Used  . Alcohol Use: No  . Drug Use: No  . Sexual Activity: Not Asked   Other Topics Concern  . None   Social History Narrative   Lives in [place] with  wife 2 children. Dog.  Works at [employer].      Regular Exercise -  Walk daily   Daily Caffeine Use:  Mt Dew - 4 to 5 20 oz bottles daily             Review of Systems  Constitutional: Positive for fatigue. Negative for fever, chills and activity change.  HENT: Positive for sore throat. Negative for congestion, ear discharge, ear pain, hearing loss, nosebleeds, postnasal drip, rhinorrhea, sinus pressure, sneezing, tinnitus, trouble swallowing and voice change.   Eyes: Negative for discharge, redness, itching and visual disturbance.  Respiratory: Negative for cough, chest tightness, shortness of breath, wheezing and stridor.   Cardiovascular: Negative for chest pain and leg swelling.  Musculoskeletal: Negative for myalgias, arthralgias, neck pain and neck stiffness.  Skin: Positive for color change. Negative for rash.  Neurological: Negative for dizziness, facial asymmetry and headaches.  Psychiatric/Behavioral: Negative for sleep disturbance.       Objective:    BP 102/70 mmHg  Pulse 86  Temp(Src) 98.6 F (37 C) (Oral)  Ht 6' 1.25" (1.861 m)  Wt 178 lb (80.74 kg)  BMI 23.31 kg/m2  SpO2 96%  Physical Exam  Constitutional: He is oriented to person, place, and time. He appears well-developed and well-nourished. No distress.  HENT:  Head: Normocephalic and atraumatic.  Right Ear: External ear normal.  Left Ear: External ear normal.  Nose: Nose normal.  Mouth/Throat: Posterior oropharyngeal erythema present. No oropharyngeal exudate.  Eyes: Conjunctivae and EOM are normal. Pupils are equal, round, and reactive to light. Right eye exhibits no discharge. Left eye exhibits no discharge. No scleral icterus.  Neck: Normal range of motion. Neck supple. No tracheal deviation present. No thyromegaly present.  Cardiovascular: Normal rate, regular rhythm and normal heart sounds.  Exam reveals no gallop and no friction rub.   No murmur heard. Pulmonary/Chest: Effort normal and  breath sounds normal. No accessory muscle usage. No tachypnea. No respiratory distress. He has no decreased breath sounds. He has no wheezes. He has no rhonchi. He has no rales. He exhibits no tenderness.  Musculoskeletal: Normal range of motion. He exhibits no edema.  Lymphadenopathy:    He has no cervical adenopathy.  Neurological: He is alert and oriented to person, place, and time. No cranial nerve deficit. Coordination normal.  Skin: Skin is warm and dry. No rash noted. He is not diaphoretic. No erythema. No pallor.  Psychiatric: He has a normal mood and affect. His behavior is normal. Judgment and thought content normal.          Assessment & Plan:   Problem List Items Addressed This Visit      Unprioritized   Allergic urticaria    Symptoms of intermittent urticaria. Unclear trigger. Will set up allergy testing. Continue prn Zyrtec and Benadryl.      Relevant Orders   Ambulatory referral to Allergy   Streptococcal sore throat - Primary    Strep throat. Start Augmentin bid. Rest, fluids. PRN Ibuprofen. Follow up if symptoms not improving.      Relevant Medications   amoxicillin-clavulanate (AUGMENTIN) 875-125 MG tablet   Other Relevant Orders   POCT rapid strep A (Completed)       Return in about 4 weeks (around 08/26/2015), or if symptoms worsen or fail to improve, for Recheck.  Ronette Deter, MD Internal Medicine [facility]

## 2015-07-29 NOTE — Assessment & Plan Note (Signed)
Symptoms of intermittent urticaria. Unclear trigger. Will set up allergy testing. Continue prn Zyrtec and Benadryl.

## 2015-07-29 NOTE — Patient Instructions (Signed)

## 2015-08-07 ENCOUNTER — Encounter: Payer: Self-pay | Admitting: Internal Medicine

## 2015-08-07 ENCOUNTER — Other Ambulatory Visit: Payer: Self-pay | Admitting: *Deleted

## 2015-08-07 DIAGNOSIS — F4323 Adjustment disorder with mixed anxiety and depressed mood: Secondary | ICD-10-CM

## 2015-08-07 MED ORDER — CLONAZEPAM 0.5 MG PO TABS: 0.5000 mg | ORAL_TABLET | Freq: Three times a day (TID) | ORAL | Status: DC | PRN

## 2015-08-07 NOTE — Telephone Encounter (Signed)
Last OV 07/29/15, Last refilled 02/16/15, # [phone] with 1 refill... Okay to refill?

## 2015-08-21 ENCOUNTER — Encounter: Payer: Self-pay | Admitting: Internal Medicine

## 2015-08-21 ENCOUNTER — Ambulatory Visit (INDEPENDENT_AMBULATORY_CARE_PROVIDER_SITE_OTHER): Payer: BC Managed Care – PPO | Admitting: Internal Medicine

## 2015-08-21 VITALS — BP 104/73 | HR 72 | Temp 98.4°F | Ht 73.25 in | Wt 174.0 lb

## 2015-08-21 DIAGNOSIS — J0100 Acute maxillary sinusitis, unspecified: Secondary | ICD-10-CM

## 2015-08-21 MED ORDER — LEVOFLOXACIN 500 MG PO TABS: 500.0000 mg | ORAL_TABLET | Freq: Every day | ORAL | Status: DC

## 2015-08-21 NOTE — Progress Notes (Signed)
Pre visit review using our clinic review tool, if applicable. No additional management support is needed unless otherwise documented below in the visit note. 

## 2015-08-21 NOTE — Assessment & Plan Note (Signed)
Symptoms and exam c/w maxillary and frontal sinusitis. Will start Levaquin (given recent use of Augmentin). Continue antihistamines and decongestants. Follow up prn if symptoms are not improving.

## 2015-08-21 NOTE — Patient Instructions (Signed)
Start Levaquin daily.  Follow up if symptoms are not improving.

## 2015-08-21 NOTE — Progress Notes (Signed)
Subjective:    Patient ID: Sergio Garcia, male    DOB: August 20, 1975, 40 y.o.   MRN: [id number]  HPI  40YO male presents for acute visit.  Sinus congestion - Feels pressure and pain in sinuses, Pressure and pain behind eyes. Using Allergy and Sinus with no improvement. No fever. Some dry cough.   Wt Readings from Last 3 Encounters:  08/21/15 174 lb (78.926 kg)  07/29/15 178 lb (80.74 kg)  06/08/15 173 lb 2 oz (78.529 kg)   BP Readings from Last 3 Encounters:  08/21/15 104/73  07/29/15 102/70  06/08/15 114/74    Past Medical History  Diagnosis Date  . Depression   . Anxiety   . GERD (gastroesophageal reflux disease)   . Insomnia   . Gastritis   . Gilbert's syndrome     hyperbilirubinemia  . Prostatitis 2008  . Degenerative disc disease   . IBS (irritable bowel syndrome)   . Enteritis   . Inguinal hernia    Family History  Problem Relation Age of Onset  . Colon polyps Mother   . Cancer Mother     sinus cavity, on XRT  . Heart disease Father   . Heart attack Father     x 2  . Pancreatic cancer Maternal Grandfather    Past Surgical History  Procedure Laterality Date  . Esophagogastroduodenoscopy  09/2004    gastritis acute and duodenitis  . Back surgery      2 ruptured discs  . Exploratory laparotomy  Jan 2015    Dr Burt Knack  . Esophagogastroduodenoscopy  01-14-14    Dr Allen Norris  . Cholecystectomy  01/31/14   Social History   Social History  . Marital Status: Married    Spouse Name: N/A  . Number of Children: 2  . Years of Education: N/A   Occupational History  . 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 Smoking status: Never Smoker   . Smokeless tobacco: Never Used  . Alcohol Use: No  . Drug Use: No  . Sexual Activity: Not Asked   Other Topics Concern  . None   Social History Narrative   Lives in [place] with wife 2 children. Dog.  Works at [employer].      Regular Exercise -  Walk daily   Daily Caffeine Use:   Mt Dew - 4 to 5 20 oz bottles daily             Review of Systems  Constitutional: Positive for fatigue. Negative for fever, chills and activity change.  HENT: Positive for congestion and sinus pressure. Negative for ear discharge, ear pain, hearing loss, nosebleeds, postnasal drip, rhinorrhea, sneezing, sore throat, tinnitus, trouble swallowing and voice change.   Eyes: Negative for discharge, redness, itching and visual disturbance.  Respiratory: Positive for cough. Negative for chest tightness, shortness of breath, wheezing and stridor.   Cardiovascular: Negative for chest pain and leg swelling.  Musculoskeletal: Negative for myalgias, arthralgias, neck pain and neck stiffness.  Skin: Negative for color change and rash.  Neurological: Negative for dizziness, facial asymmetry and headaches.  Psychiatric/Behavioral: Negative for sleep disturbance.       Objective:    BP 104/73 mmHg  Pulse 72  Temp(Src) 98.4 F (36.9 C) (Oral)  Ht 6' 1.25" (1.861 m)  Wt 174 lb (78.926 kg)  BMI 22.79 kg/m2  SpO2 98% Physical Exam  Constitutional: He is oriented to person, place, and time. He appears well-developed and well-nourished.  No distress.  HENT:  Head: Normocephalic and atraumatic.  Right Ear: Tympanic membrane and external ear normal.  Left Ear: Tympanic membrane and external ear normal.  Nose: Mucosal edema present. Right sinus exhibits maxillary sinus tenderness. Left sinus exhibits maxillary sinus tenderness.  Mouth/Throat: Oropharynx is clear and moist. No oropharyngeal exudate.  Eyes: Conjunctivae and EOM are normal. Pupils are equal, round, and reactive to light. Right eye exhibits no discharge. Left eye exhibits no discharge. No scleral icterus.  Neck: Normal range of motion. Neck supple. No tracheal deviation present. No thyromegaly present.  Cardiovascular: Normal rate, regular rhythm and normal heart sounds.  Exam reveals no gallop and no friction rub.   No murmur  heard. Pulmonary/Chest: Effort normal and breath sounds normal. No accessory muscle usage. No tachypnea. No respiratory distress. He has no decreased breath sounds. He has no wheezes. He has no rhonchi. He has no rales. He exhibits no tenderness.  Musculoskeletal: Normal range of motion. He exhibits no edema.  Lymphadenopathy:    He has no cervical adenopathy.  Neurological: He is alert and oriented to person, place, and time. No cranial nerve deficit. Coordination normal.  Skin: Skin is warm and dry. No rash noted. He is not diaphoretic. No erythema. No pallor.  Psychiatric: He has a normal mood and affect. His behavior is normal. Judgment and thought content normal.          Assessment & Plan:   Problem List Items Addressed This Visit      Unprioritized   Acute maxillary sinusitis - Primary    Symptoms and exam c/w maxillary and frontal sinusitis. Will start Levaquin (given recent use of Augmentin). Continue antihistamines and decongestants. Follow up prn if symptoms are not improving.      Relevant Medications   levofloxacin (LEVAQUIN) 500 MG tablet       Return if symptoms worsen or fail to improve.  Ronette Deter, MD Internal Medicine [facility]

## 2015-08-26 ENCOUNTER — Ambulatory Visit: Payer: BC Managed Care – PPO | Admitting: Internal Medicine

## 2015-08-26 DIAGNOSIS — Z0289 Encounter for other administrative examinations: Secondary | ICD-10-CM

## 2015-09-30 ENCOUNTER — Ambulatory Visit (INDEPENDENT_AMBULATORY_CARE_PROVIDER_SITE_OTHER): Payer: BC Managed Care – PPO | Admitting: Internal Medicine

## 2015-09-30 ENCOUNTER — Encounter: Payer: Self-pay | Admitting: Internal Medicine

## 2015-09-30 VITALS — BP 122/84 | HR 70 | Temp 97.9°F | Ht 73.25 in | Wt 179.1 lb

## 2015-09-30 DIAGNOSIS — F4323 Adjustment disorder with mixed anxiety and depressed mood: Secondary | ICD-10-CM

## 2015-09-30 DIAGNOSIS — R1011 Right upper quadrant pain: Secondary | ICD-10-CM | POA: Diagnosis not present

## 2015-09-30 LAB — COMPREHENSIVE METABOLIC PANEL WITH GFR
ALT: 20 U/L (ref 0–53)
AST: 16 U/L (ref 0–37)
Albumin: 4.4 g/dL (ref 3.5–5.2)
Alkaline Phosphatase: 55 U/L (ref 39–117)
BUN: 17 mg/dL (ref 6–23)
CO2: 29 meq/L (ref 19–32)
Calcium: 9.6 mg/dL (ref 8.4–10.5)
Chloride: 103 meq/L (ref 96–112)
Creatinine, Ser: 1.19 mg/dL (ref 0.40–1.50)
GFR: 72.81 mL/min
Glucose, Bld: 92 mg/dL (ref 70–99)
Potassium: 4.5 meq/L (ref 3.5–5.1)
Sodium: 141 meq/L (ref 135–145)
Total Bilirubin: 2.4 mg/dL — ABNORMAL HIGH (ref 0.2–1.2)
Total Protein: 7.1 g/dL (ref 6.0–8.3)

## 2015-09-30 MED ORDER — ESCITALOPRAM OXALATE 5 MG PO TABS: 5.0000 mg | ORAL_TABLET | Freq: Every day | ORAL | Status: DC

## 2015-09-30 MED ORDER — PANTOPRAZOLE SODIUM 40 MG PO TBEC: 40.0000 mg | DELAYED_RELEASE_TABLET | Freq: Every day | ORAL | Status: DC

## 2015-09-30 MED ORDER — CLONAZEPAM 0.5 MG PO TABS: 0.5000 mg | ORAL_TABLET | Freq: Three times a day (TID) | ORAL | Status: DC | PRN

## 2015-09-30 NOTE — Assessment & Plan Note (Signed)
RUQ and epigastric abdominal pain. Suspect gastritis or ulcer. Will start pantoprazole. Will also check CMP with labs and repeat RUQ [place]. Follow up in after imaging complete.

## 2015-09-30 NOTE — Assessment & Plan Note (Signed)
Worsening symptoms of depression and anxiety. Will add Lexapro 5mg  daily. Continue Wellbutrin and prn Clonazepam. Follow up in 4 weeks and sooner as needed.

## 2015-09-30 NOTE — Patient Instructions (Addendum)
Add Lexapro 5mg  daily.  Continue Wellbutrin.  Start Pantoprazole daily.  We will schedule a right upper quadrant ultrasound.  Labs today.

## 2015-09-30 NOTE — Progress Notes (Signed)
Subjective:    Patient ID: Sergio Garcia, male    DOB: 1975/07/25, 40 y.o.   MRN: [id number]  HPI  40YO male presents for follow up.  Last week or so, felt more depressed. Taking Wellbutrin 177m twice daily. Also notes some anxiety.  Changing jobs. But not other major stressors. Stopped taking Clonazepam and had some improvement. No suicidal thoughts.  Right upper abdomen pain - Last couple of weeks. S/p cholecystectomy 2015.  No NV. Has IBS with some loose stools. Described as constant dull pain. No change with food intake.  Wt Readings from Last 3 Encounters:  09/30/15 179 lb 2 oz (81.251 kg)  08/21/15 174 lb (78.926 kg)  07/29/15 178 lb (80.74 kg)   BP Readings from Last 3 Encounters:  09/30/15 122/84  08/21/15 104/73  07/29/15 102/70    Past Medical History  Diagnosis Date  . Depression   . Anxiety   . GERD (gastroesophageal reflux disease)   . Insomnia   . Gastritis   . Gilbert's syndrome     hyperbilirubinemia  . Prostatitis 2008  . Degenerative disc disease   . IBS (irritable bowel syndrome)   . Enteritis   . Inguinal hernia    Family History  Problem Relation Age of Onset  . Colon polyps Mother   . Cancer Mother     sinus cavity, on XRT  . Heart disease Father   . Heart attack Father     x 2  . Pancreatic cancer Maternal Grandfather    Past Surgical History  Procedure Laterality Date  . Esophagogastroduodenoscopy  09/2004    gastritis acute and duodenitis  . Back surgery      2 ruptured discs  . Exploratory laparotomy  Jan 2015    Dr CBurt Knack . Esophagogastroduodenoscopy  01-14-14    Dr WAllen Norris . Cholecystectomy  01/31/14   Social History   Social History  . Marital Status: Married    Spouse Name: N/A  . Number of Children: 2  . Years of Education: N/A   Occupational History  . [employer]Designer, [employer]  Social History Main Topics  . Smoking status: Never Smoker   . Smokeless tobacco: Never Used  . Alcohol Use: No  . Drug  Use: No  . Sexual Activity: Not Asked   Other Topics Concern  . None   Social History Narrative   [place]  Works at [employer]      Regular Exercise -  Walk daily   Daily Caffeine Use:  Mt Dew - 4 to 5 20 oz bottles daily             Review of Systems  Constitutional: Negative for fever, chills, activity change, appetite change, fatigue and unexpected weight change.  Eyes: Negative for visual disturbance.  Respiratory: Negative for cough and shortness of breath.   Cardiovascular: Negative for chest pain, palpitations and leg swelling.  Gastrointestinal: Positive for abdominal pain, diarrhea and constipation. Negative for nausea, vomiting, blood in stool and abdominal distention.  Genitourinary: Negative for dysuria, urgency and difficulty urinating.  Musculoskeletal: Negative for arthralgias and gait problem.  Skin: Negative for color change and rash.  Hematological: Negative for adenopathy.  Psychiatric/Behavioral: Positive for sleep disturbance and dysphoric mood. Negative for suicidal ideas, behavioral problems, decreased concentration and agitation. The patient is nervous/anxious.        Objective:    BP 122/84 mmHg  Pulse 70  Temp(Src) 97.9 F (36.6 C) (Oral)  Ht 6' 1.25" (1.861 m)  Wt 179 lb 2 oz (81.251 kg)  BMI 23.46 kg/m2  SpO2 98% Physical Exam  Constitutional: He is oriented to person, place, and time. He appears well-developed and well-nourished. No distress.  HENT:  Head: Normocephalic and atraumatic.  Right Ear: External ear normal.  Left Ear: External ear normal.  Nose: Nose normal.  Mouth/Throat: Oropharynx is clear and moist. No oropharyngeal exudate.  Eyes: Conjunctivae and EOM are normal. Pupils are equal, round, and reactive to light. Right eye exhibits no discharge. Left eye exhibits no discharge. No scleral icterus.  Neck: Normal range of motion. Neck supple. No tracheal deviation present. No  thyromegaly present.  Cardiovascular: Normal rate, regular rhythm and normal heart sounds.  Exam reveals no gallop and no friction rub.   No murmur heard. Pulmonary/Chest: Effort normal and breath sounds normal. No accessory muscle usage. No tachypnea. No respiratory distress. He has no decreased breath sounds. He has no wheezes. He has no rhonchi. He has no rales. He exhibits no tenderness.  Abdominal: Soft. Normal appearance and bowel sounds are normal. He exhibits no distension and no mass. There is no hepatosplenomegaly. There is tenderness in the right upper quadrant and epigastric area. There is no rebound and no guarding.  Musculoskeletal: Normal range of motion. He exhibits no edema.  Lymphadenopathy:    He has no cervical adenopathy.  Neurological: He is alert and oriented to person, place, and time. No cranial nerve deficit. Coordination normal.  Skin: Skin is warm and dry. No rash noted. He is not diaphoretic. No erythema. No pallor.  Psychiatric: He has a normal mood and affect. His behavior is normal. Judgment and thought content normal.          Assessment & Plan:   Problem List Items Addressed This Visit      Unprioritized   Abdominal pain, right upper quadrant    RUQ and epigastric abdominal pain. Suspect gastritis or ulcer. Will start pantoprazole. Will also check CMP with labs and repeat RUQ [place]. Follow up in after imaging complete.      Relevant Medications   pantoprazole (PROTONIX) 40 MG tablet   Other Relevant Orders   Comp Met (CMET)   US Abdomen Limited RUQ   Adjustment disorder with mixed anxiety and depressed mood - Primary    Worsening symptoms of depression and anxiety. Will add Lexapro 90m daily. Continue Wellbutrin and prn Clonazepam. Follow up in 4 weeks and sooner as needed.      Relevant Medications   clonazePAM (KLONOPIN) 0.5 MG tablet       Return in about 4 weeks (around 10/28/2015) for Recheck.  JRonette Deter MD Internal  Medicine [facility]

## 2015-10-07 ENCOUNTER — Ambulatory Visit
Admission: RE | Admit: 2015-10-07 | Discharge: 2015-10-07 | Disposition: A | Discharge status: Home / Self Care | Payer: BC Managed Care – PPO | Source: Ambulatory Visit | Attending: Internal Medicine | Admitting: Internal Medicine

## 2015-10-07 DIAGNOSIS — Z9049 Acquired absence of other specified parts of digestive tract: Secondary | ICD-10-CM | POA: Insufficient documentation

## 2015-10-07 DIAGNOSIS — R1011 Right upper quadrant pain: Secondary | ICD-10-CM | POA: Insufficient documentation

## 2015-10-14 ENCOUNTER — Other Ambulatory Visit: Payer: Self-pay | Admitting: Internal Medicine

## 2015-10-30 ENCOUNTER — Encounter: Payer: Self-pay | Admitting: Internal Medicine

## 2015-10-30 ENCOUNTER — Ambulatory Visit (INDEPENDENT_AMBULATORY_CARE_PROVIDER_SITE_OTHER): Payer: BC Managed Care – PPO | Admitting: Internal Medicine

## 2015-10-30 VITALS — BP 118/82 | HR 70 | Ht 73.0 in | Wt 183.2 lb

## 2015-10-30 DIAGNOSIS — F4323 Adjustment disorder with mixed anxiety and depressed mood: Secondary | ICD-10-CM | POA: Diagnosis not present

## 2015-10-30 DIAGNOSIS — R1011 Right upper quadrant pain: Secondary | ICD-10-CM

## 2015-10-30 DIAGNOSIS — F411 Generalized anxiety disorder: Secondary | ICD-10-CM

## 2015-10-30 DIAGNOSIS — L559 Sunburn, unspecified: Secondary | ICD-10-CM | POA: Insufficient documentation

## 2015-10-30 MED ORDER — GLYCOPYRROLATE 1 MG PO TABS: ORAL_TABLET | ORAL | Status: DC

## 2015-10-30 MED ORDER — CLONAZEPAM 0.5 MG PO TABS: 0.5000 mg | ORAL_TABLET | Freq: Three times a day (TID) | ORAL | Status: DC | PRN

## 2015-10-30 MED ORDER — BUPROPION HCL ER (SR) 200 MG PO TB12: 200.0000 mg | ORAL_TABLET | Freq: Two times a day (BID) | ORAL | Status: DC

## 2015-10-30 NOTE — Assessment & Plan Note (Signed)
Intermittent RUQ pain. S/p cholecystectomy. No improvement with pantoprazole. No pain currently. [place] was normal. Discussed GI evaluation with endoscopy. He prefers to hold off for now. Will monitor.

## 2015-10-30 NOTE — Assessment & Plan Note (Signed)
See above. Stopping Lexapro. Change Wellbutrin to SR 200mg  bid. Continue prn Clonazepam as needed for severe anxiety.

## 2015-10-30 NOTE — Assessment & Plan Note (Signed)
Persistent anxiety and limiting side effects from Lexapro. Will stop Lexapro. Will try changing Wellbutrin to 200mg  po bid. Follow up in 4 weeks.

## 2015-10-30 NOTE — Progress Notes (Addendum)
Subjective:    Patient ID: Sergio Garcia, male    DOB: [birth year], 40 y.o.   MRN: [id number]  HPI  40YO male presents for follow up.  Last seen in 09/2015. Started on Lexapro to help with anxiety and evaluated for abdominal pain.  Feels more jittery on Lexapro and notes sexual dysfunction. Continues to feel anxious. Using prn Clonazepam on most days with some temporary improvement.  Continues to have some intermittent right upper abdominal pain. Rare, though. Continues on Pantoprazole. No NV. Appetite good. Plans to follow up with Dr. Fuller Plan in future.  Notes sunburn over right shoulder, obtained last weekend. Has been applying Neosporin to peeling area.   Wt Readings from Last 3 Encounters:  10/30/15 183 lb 3.7 oz (83.113 kg)  09/30/15 179 lb 2 oz (81.251 kg)  08/21/15 174 lb (78.926 kg)   BP Readings from Last 3 Encounters:  10/30/15 118/82  09/30/15 122/84  08/21/15 104/73    Past Medical History  Diagnosis Date  . Depression   . Anxiety   . GERD (gastroesophageal reflux disease)   . Insomnia   . Gastritis   . Gilbert's syndrome     hyperbilirubinemia  . Prostatitis 2008  . Degenerative disc disease   . IBS (irritable bowel syndrome)   . Enteritis   . Inguinal hernia    Family History  Problem Relation Age of Onset  . Colon polyps Mother   . Cancer Mother     sinus cavity, on XRT  . Heart disease Father   . Heart attack Father     x 2  . Pancreatic cancer Maternal Grandfather    Past Surgical History  Procedure Laterality Date  . Esophagogastroduodenoscopy  09/2004    gastritis acute and duodenitis  . Back surgery      2 ruptured discs  . Exploratory laparotomy  Jan 2015    Dr Burt Knack  . Esophagogastroduodenoscopy  01-14-14    Dr Allen Norris  . Cholecystectomy  01/31/14   Social History   Social History  . Marital Status: Married    Spouse Name: N/A  . Number of Children: 2  . Years of Education: N/A   Occupational History  . Designer, fashion/clothing  [employer]   Social History Main Topics  . Smoking status: Never Smoker   . Smokeless tobacco: Never Used  . Alcohol Use: No  . Drug Use: No  . Sexual Activity: Not Asked   Other Topics Concern  . None   Social History Narrative   Lives in [place] with wife 2 children. Dog.  Works at [employer].      Regular Exercise -  Walk daily   Daily Caffeine Use:  Mt Dew - 4 to 5 20 oz bottles daily             Review of Systems  Constitutional: Negative for fever, chills, activity change, appetite change, fatigue and unexpected weight change.  Eyes: Negative for visual disturbance.  Respiratory: Negative for cough and shortness of breath.   Cardiovascular: Negative for chest pain, palpitations and leg swelling.  Gastrointestinal: Positive for abdominal pain. Negative for nausea, vomiting, diarrhea, constipation and abdominal distention.  Genitourinary: Negative for dysuria, urgency and difficulty urinating.  Musculoskeletal: Negative for arthralgias and gait problem.  Skin: Positive for color change. Negative for rash.  Hematological: Negative for adenopathy.  Psychiatric/Behavioral: Negative for sleep disturbance and dysphoric mood. The patient is nervous/anxious.        Objective:  BP 118/82 mmHg  Pulse 70  Ht 6\' 1"  (1.854 m)  Wt 183 lb 3.7 oz (83.113 kg)  BMI 24.18 kg/m2  SpO2 97% Physical Exam  Constitutional: He is oriented to person, place, and time. He appears well-developed and well-nourished. No distress.  HENT:  Head: Normocephalic and atraumatic.  Right Ear: External ear normal.  Left Ear: External ear normal.  Nose: Nose normal.  Mouth/Throat: Oropharynx is clear and moist. No oropharyngeal exudate.  Eyes: Conjunctivae and EOM are normal. Pupils are equal, round, and reactive to light. Right eye exhibits no discharge. Left eye exhibits no discharge. No scleral icterus.  Neck: Normal range of motion. Neck supple. No tracheal deviation present.  No thyromegaly present.  Cardiovascular: Normal rate, regular rhythm and normal heart sounds.  Exam reveals no gallop and no friction rub.   No murmur heard. Pulmonary/Chest: Effort normal and breath sounds normal. No respiratory distress. He has no wheezes. He has no rales. He exhibits no tenderness.  Abdominal: Soft. Bowel sounds are normal. He exhibits no distension and no mass. There is no tenderness. There is no rebound and no guarding.  Musculoskeletal: Normal range of motion. He exhibits no edema.  Lymphadenopathy:    He has no cervical adenopathy.  Neurological: He is alert and oriented to person, place, and time. No cranial nerve deficit. Coordination normal.  Skin: Skin is warm and dry. No rash noted. He is not diaphoretic. No erythema. No pallor.     Psychiatric: He has a normal mood and affect. His behavior is normal. Judgment and thought content normal.          Assessment & Plan:   Problem List Items Addressed This Visit      Unprioritized   Abdominal pain, right upper quadrant    Intermittent RUQ pain. S/p cholecystectomy. No improvement with pantoprazole. No pain currently. [place] was normal. Discussed GI evaluation with endoscopy. He prefers to hold off for now. Will monitor.      Adjustment disorder with mixed anxiety and depressed mood    Persistent anxiety and limiting side effects from Lexapro. Will stop Lexapro. Will try changing Wellbutrin to 200mg  po bid. Follow up in 4 weeks.      Relevant Medications   clonazePAM (KLONOPIN) 0.5 MG tablet   Generalized anxiety disorder - Primary    See above. Stopping Lexapro. Change Wellbutrin to SR 200mg  bid. Continue prn Clonazepam as needed for severe anxiety.      Sunburn    Sunburn right shoulder. Encouraged him to use Neosporin or Silvadene to peeling areas. Follow up prn. Avoid prolonged sun exposure.          Return in about 4 weeks (around 11/27/2015) for Recheck.  Ronette Deter, MD Internal  Medicine [facility]

## 2015-10-30 NOTE — Patient Instructions (Addendum)
Change Wellbutrin to 200mg  twice daily.  Follow up in 4 weeks.

## 2015-10-30 NOTE — Assessment & Plan Note (Signed)
Sunburn right shoulder. Encouraged him to use Neosporin or Silvadene to peeling areas. Follow up prn. Avoid prolonged sun exposure.

## 2015-11-06 NOTE — Telephone Encounter (Signed)
error 

## 2015-11-25 ENCOUNTER — Ambulatory Visit (INDEPENDENT_AMBULATORY_CARE_PROVIDER_SITE_OTHER): Payer: BC Managed Care – PPO | Admitting: Internal Medicine

## 2015-11-25 ENCOUNTER — Encounter: Payer: Self-pay | Admitting: Internal Medicine

## 2015-11-25 VITALS — BP 133/78 | HR 91 | Ht 73.0 in | Wt 183.4 lb

## 2015-11-25 DIAGNOSIS — F4323 Adjustment disorder with mixed anxiety and depressed mood: Secondary | ICD-10-CM | POA: Diagnosis not present

## 2015-11-25 DIAGNOSIS — F411 Generalized anxiety disorder: Secondary | ICD-10-CM

## 2015-11-25 MED ORDER — BUPROPION HCL ER (XL) 150 MG PO TB24: 150.0000 mg | ORAL_TABLET | Freq: Every day | ORAL | Status: DC

## 2015-11-25 MED ORDER — CLONAZEPAM 0.5 MG PO TABS: 0.5000 mg | ORAL_TABLET | Freq: Three times a day (TID) | ORAL | Status: DC | PRN

## 2015-11-25 MED ORDER — WELLBUTRIN XL 150 MG PO TB24: 150.0000 mg | ORAL_TABLET | Freq: Every day | ORAL | Status: DC

## 2015-11-25 NOTE — Assessment & Plan Note (Signed)
Symptoms of worsening tremor in hands on short acting Wellbutrin. Will change back to long acting Wellbutrin XL, at a lower dose of 150mg  daily. Continue prn Clonazepam. Follow up recheck in 4 weeks.

## 2015-11-25 NOTE — Progress Notes (Signed)
Pre-visit discussion using our clinic review tool. No additional management support is needed unless otherwise documented below in the visit note.  

## 2015-11-25 NOTE — Patient Instructions (Signed)
Stop short acting Wellbutrin.  Start back on 24hr Wellbutrin on Saturday.  Follow up recheck in 2 weeks.

## 2015-11-25 NOTE — Progress Notes (Signed)
Subjective:    Patient ID: Sergio Garcia, male    DOB: 11-20-1975, 40 y.o.   MRN: [id number]  HPI  40YO male presents for follow up.  Anxiety - Recently seen for worsening anxiety. Wellbutrin was changed to short acting twice daily. Since changing to Wellbutrin SR, he has had worsening jitteriness, and tremors in his hands. He would like to change back to Wellbutrin XL as this worked well for him in the past.  Wt Readings from Last 3 Encounters:  11/25/15 183 lb 6.4 oz (83.19 kg)  10/30/15 183 lb 3.7 oz (83.113 kg)  09/30/15 179 lb 2 oz (81.251 kg)   BP Readings from Last 3 Encounters:  11/25/15 133/78  10/30/15 118/82  09/30/15 122/84    Past Medical History  Diagnosis Date  . Depression   . Anxiety   . GERD (gastroesophageal reflux disease)   . Insomnia   . Gastritis   . Gilbert's syndrome     hyperbilirubinemia  . Prostatitis 2008  . Degenerative disc disease   . IBS (irritable bowel syndrome)   . Enteritis   . Inguinal hernia    Family History  Problem Relation Age of Onset  . Colon polyps Mother   . Cancer Mother     sinus cavity, on XRT  . Heart disease Father   . Heart attack Father     x 2  . Pancreatic cancer Maternal Grandfather    Past Surgical History  Procedure Laterality Date  . Esophagogastroduodenoscopy  09/2004    gastritis acute and duodenitis  . Back surgery      2 ruptured discs  . Exploratory laparotomy  Jan 2015    Dr Burt Knack  . Esophagogastroduodenoscopy  01-14-14    Dr Allen Norris  . Cholecystectomy  01/31/14   Social History   Social History  . Marital Status: Married    Spouse Name: N/A  . Number of Children: 2  . Years of Education: N/A   Occupational History  . 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 Smoking status: Never Smoker   . Smokeless tobacco: Never Used  . Alcohol Use: No  . Drug Use: No  . Sexual Activity: Not Asked   Other Topics Concern  . None   Social History Narrative   Lives in  [place] with wife 2 children. Dog.  Works at [employer].      Regular Exercise -  Walk daily   Daily Caffeine Use:  Mt Dew - 4 to 5 20 oz bottles daily             Review of Systems  Constitutional: Negative for fever, chills, activity change, appetite change, fatigue and unexpected weight change.  Eyes: Negative for visual disturbance.  Respiratory: Negative for cough and shortness of breath.   Cardiovascular: Negative for chest pain, palpitations and leg swelling.  Gastrointestinal: Negative for abdominal pain and abdominal distention.  Genitourinary: Negative for dysuria, urgency and difficulty urinating.  Musculoskeletal: Negative for arthralgias and gait problem.  Skin: Negative for color change and rash.  Neurological: Positive for tremors.  Hematological: Negative for adenopathy.  Psychiatric/Behavioral: Negative for suicidal ideas, sleep disturbance and dysphoric mood. The patient is nervous/anxious.        Objective:    BP 133/78 mmHg  Pulse 91  Ht 6\' 1"  (1.854 m)  Wt 183 lb 6.4 oz (83.19 kg)  BMI 24.20 kg/m2  SpO2 97% Physical Exam  Constitutional: He is oriented  to person, place, and time. He appears well-developed and well-nourished. No distress.  HENT:  Head: Normocephalic and atraumatic.  Right Ear: External ear normal.  Left Ear: External ear normal.  Nose: Nose normal.  Mouth/Throat: Oropharynx is clear and moist. No oropharyngeal exudate.  Eyes: Conjunctivae and EOM are normal. Pupils are equal, round, and reactive to light. Right eye exhibits no discharge. Left eye exhibits no discharge. No scleral icterus.  Neck: Normal range of motion. Neck supple. No tracheal deviation present. No thyromegaly present.  Cardiovascular: Normal rate, regular rhythm and normal heart sounds.  Exam reveals no gallop and no friction rub.   No murmur heard. Pulmonary/Chest: Effort normal and breath sounds normal. No accessory muscle usage. No tachypnea. No  respiratory distress. He has no decreased breath sounds. He has no wheezes. He has no rhonchi. He has no rales. He exhibits no tenderness.  Musculoskeletal: Normal range of motion. He exhibits no edema.  Lymphadenopathy:    He has no cervical adenopathy.  Neurological: He is alert and oriented to person, place, and time. He displays tremor (bilateral hands). No cranial nerve deficit. Coordination and gait normal.  Skin: Skin is warm and dry. No rash noted. He is not diaphoretic. No erythema. No pallor.  Psychiatric: His behavior is normal. Judgment and thought content normal. His mood appears anxious. His speech is rapid and/or pressured.          Assessment & Plan:   Problem List Items Addressed This Visit      Unprioritized   Adjustment disorder with mixed anxiety and depressed mood   Relevant Medications   clonazePAM (KLONOPIN) 0.5 MG tablet   Generalized anxiety disorder - Primary    Symptoms of worsening tremor in hands on short acting Wellbutrin. Will change back to long acting Wellbutrin XL, at a lower dose of 150mg  daily. Continue prn Clonazepam. Follow up recheck in 4 weeks.          Return in about 2 weeks (around 12/09/2015) for Recheck.  Ronette Deter, MD Internal Medicine [facility]

## 2015-11-26 ENCOUNTER — Other Ambulatory Visit: Payer: Self-pay | Admitting: Internal Medicine

## 2015-11-27 ENCOUNTER — Other Ambulatory Visit: Payer: Self-pay

## 2015-11-27 MED ORDER — ONDANSETRON HCL 8 MG PO TABS: 8.0000 mg | ORAL_TABLET | Freq: Three times a day (TID) | ORAL | Status: DC | PRN

## 2015-11-27 NOTE — Telephone Encounter (Signed)
Medication refill

## 2015-12-11 ENCOUNTER — Ambulatory Visit: Payer: Self-pay | Admitting: Internal Medicine

## 2015-12-11 DIAGNOSIS — Z0289 Encounter for other administrative examinations: Secondary | ICD-10-CM

## 2015-12-14 ENCOUNTER — Ambulatory Visit: Payer: Self-pay | Admitting: Internal Medicine

## 2016-01-19 ENCOUNTER — Ambulatory Visit: Payer: Self-pay | Admitting: Family Medicine

## 2016-02-01 ENCOUNTER — Ambulatory Visit: Payer: Self-pay | Admitting: Primary Care

## 2016-02-05 ENCOUNTER — Ambulatory Visit: Payer: Self-pay | Admitting: Primary Care

## 2016-02-17 ENCOUNTER — Ambulatory Visit (INDEPENDENT_AMBULATORY_CARE_PROVIDER_SITE_OTHER): Payer: BC Managed Care – PPO | Admitting: Primary Care

## 2016-02-17 ENCOUNTER — Encounter: Payer: Self-pay | Admitting: Primary Care

## 2016-02-17 VITALS — BP 124/84 | HR 86 | Temp 97.6°F | Ht 73.0 in | Wt 193.8 lb

## 2016-02-17 DIAGNOSIS — M62830 Muscle spasm of back: Secondary | ICD-10-CM | POA: Diagnosis not present

## 2016-02-17 DIAGNOSIS — K589 Irritable bowel syndrome without diarrhea: Secondary | ICD-10-CM

## 2016-02-17 DIAGNOSIS — F4323 Adjustment disorder with mixed anxiety and depressed mood: Secondary | ICD-10-CM

## 2016-02-17 DIAGNOSIS — Z8249 Family history of ischemic heart disease and other diseases of the circulatory system: Secondary | ICD-10-CM | POA: Diagnosis not present

## 2016-02-17 DIAGNOSIS — F411 Generalized anxiety disorder: Secondary | ICD-10-CM

## 2016-02-17 DIAGNOSIS — Z23 Encounter for immunization: Secondary | ICD-10-CM

## 2016-02-17 MED ORDER — CYCLOBENZAPRINE HCL 5 MG PO TABS: 5.0000 mg | ORAL_TABLET | Freq: Three times a day (TID) | ORAL | 0 refills | Status: DC | PRN

## 2016-02-17 MED ORDER — BUSPIRONE HCL 7.5 MG PO TABS: 7.5000 mg | ORAL_TABLET | Freq: Two times a day (BID) | ORAL | 1 refills | Status: DC

## 2016-02-17 MED ORDER — CLONAZEPAM 0.5 MG PO TABS: ORAL_TABLET | ORAL | 0 refills | Status: DC

## 2016-02-17 MED ORDER — HYDROXYZINE HCL 25 MG PO TABS: ORAL_TABLET | ORAL | 0 refills | Status: DC

## 2016-02-17 NOTE — Assessment & Plan Note (Signed)
Increased irritability and anxiety on Wellbutrin XL 150 mg. Given this is the second attempt on Wellbutrin, will wean off and switch to Buspar. Failed treatment on Lexapro and Effexor. Buspar sent to pharmacy. Also discussed goal of weaning off of Clonazepam. Will transition him to hydroxyzine PRN. Follow up in 6 weeks.

## 2016-02-17 NOTE — Assessment & Plan Note (Deleted)
Increased irritability and anxiety on Wellbutrin XL 150 mg. Given this is the second attempt on Wellbutrin, will wean off and switch to Buspar. Failed treatment on Lexapro and Effexor. Buspar sent to pharmacy. Also discussed goal of weaning off of Clonazepam. Will transition him to hydroxyzine PRN.

## 2016-02-17 NOTE — Addendum Note (Signed)
Addended by: Jacqualin Combes on: 02/17/2016 04:26 PM   Modules accepted: Orders

## 2016-02-17 NOTE — Progress Notes (Signed)
Pre visit review using our clinic review tool, if applicable. No additional management support is needed unless otherwise documented below in the visit note. 

## 2016-02-17 NOTE — Assessment & Plan Note (Signed)
Managed on Glycopyrrolate 1 mg per GI. Has noticed reduction in medication on symptoms. He will call GI to schedule follow up.

## 2016-02-17 NOTE — Patient Instructions (Addendum)
We need to wean you off of your Wellbutrin XL 150 mg tablets. Take 1 tablet by mouth every other day for 10 days then stop.  Start Buspar (buspirone) 7.5 mg tablets for anxiety. Take 1 tablet by mouth twice daily.  Try to wean off of your Clonazepam tablets. Take 1 tablet by mouth once daily for 5 days, then 1/2 tablet daily for 5 days then stop.  You may try hydroxyzine 25 mg tablets for breakthrough anxiety. Take 1 tablet by mouth once to twice daily as needed for anxiety.   Try taking 1 tablet of Clonazepam in the morning and 1 tablet of hydroxyzine if needed later in the day for anxiety. Caution as this could cause drowsiness. Try to use hydroxyzine in place of Clonazepam.  Follow up in 6 weeks for re-evaluation of anxiety.   It was a pleasure to meet you today! Please don't hesitate to call me with any questions. Welcome to [facility]!

## 2016-02-17 NOTE — Progress Notes (Signed)
Subjective:    Patient ID: Sergio Garcia, male    DOB: 1975/06/14, 40 y.o.   MRN: [id number]  HPI  [name] is a 40 year old male who presents to today to transfer care from [facility].   1) Generalized Anxiety Disorder: Currently managed on Wellbutruin XL 150 mg and Clonazepam 1 mg once daily. Also with tremor to hands. He was previously managed on Lexapro and Effexor which were discontinued. He cannot remember the reason these medications were discontinued, but likely because they were ineffective. Since being on the Wellbutrin he's noticed increased irritability, anxiety, and shaking. He takes his Clonazepam everyday as he will feel short of breath and feels like he may have panic attacks without this. Denies SI/HI, depression, difficulty sleeping.  2) IBS: Currently managed on glycopyrrolate 1 mg for IBS symptoms. Follows with GI but prior PCP was prescribing for the last refill. He has not followed up with GI in nearly 2 years. He has noticed that his IBS symptoms are as well controlled on the Glycopyrrolate 1 mg.   3) Muscle Strain: Moving to a new home for the past 2 weekends and has devolped achy pain to the right lower back. Denies numbness/tinlging. He would like a refill of his Flexeril as this has historically helped in the past. He's applied heat without much improvement and is working out regularly as he's getting ready for [employer] school.   Review of Systems  Respiratory: Negative for shortness of breath.   Cardiovascular: Negative for chest pain.  Musculoskeletal: Positive for back pain and myalgias.  Neurological: Negative for numbness.  Psychiatric/Behavioral: Negative for sleep disturbance and suicidal ideas. The patient is nervous/anxious.        Tremor, chronic but increased       Past Medical History:  Diagnosis Date  . Anxiety   . Degenerative disc disease   . Depression   . Enteritis   . Gastritis   . GERD (gastroesophageal reflux disease)   .  Gilbert's syndrome    hyperbilirubinemia  . IBS (irritable bowel syndrome)   . Inguinal hernia   . Insomnia   . Prostatitis 2008     Social History   Social History  . Marital status: Married    Spouse name: N/A  . Number of children: 2  . Years of education: N/A   Occupational History  . 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 Smoking status: Never Smoker  . Smokeless tobacco: Never Used  . Alcohol use No  . Drug use: No  . Sexual activity: Not on file   Other Topics Concern  . Not on file   Social History Narrative   Married.   Lives in [place].   Recently accepted to [employer].   2 children.   Enjoys watching racing, Dealer, deer hunting.                 Past Surgical History:  Procedure Laterality Date  . BACK SURGERY     2 ruptured discs  . CHOLECYSTECTOMY  01/31/14  . ESOPHAGOGASTRODUODENOSCOPY  09/2004   gastritis acute and duodenitis  . ESOPHAGOGASTRODUODENOSCOPY  01-14-14   Dr Allen Norris  . EXPLORATORY LAPAROTOMY  Jan 2015   Dr Burt Knack    Family History  Problem Relation Age of Onset  . Colon polyps Mother   . Cancer Mother     sinus cavity, on XRT  . Heart disease Father   . Heart attack  Father     x 2  . Pancreatic cancer Maternal Grandfather     Allergies  Allergen Reactions  . Morphine And Related Anaphylaxis  . Morphine Nausea And Vomiting  . Tdap [Diphth-Acell Pertussis-Tetanus] Hives and Rash    Current Outpatient Prescriptions on File Prior to Visit  Medication Sig Dispense Refill  . cetirizine (ZYRTEC) 10 MG tablet Take 10 mg by mouth daily.    Marland Kitchen glycopyrrolate (ROBINUL) 1 MG tablet TAKE 1 TABLET (1 MG TOTAL) BY MOUTH 2 (TWO) TIMES DAILY AS NEEDED. 60 tablet 1  . WELLBUTRIN XL 150 MG 24 hr tablet Take 1 tablet (150 mg total) by mouth daily. 30 tablet 3   No current facility-administered medications on file prior to visit.     BP 124/84   Pulse 86   Temp 97.6 F (36.4 C) (Oral)   Ht 6\' 1"   (1.854 m)   Wt 193 lb 12.8 oz (87.9 kg)   SpO2 98%   BMI 25.57 kg/m    Objective:   Physical Exam  Constitutional: He is oriented to person, place, and time. He appears well-nourished.  Neck: Neck supple.  Cardiovascular: Normal rate and regular rhythm.   Pulmonary/Chest: Effort normal and breath sounds normal. He has no wheezes. He has no rales.  Musculoskeletal:  Right lower back tenderness without decrease in ROM. No spinal tenderness.  Neurological: He is alert and oriented to person, place, and time.  Skin: Skin is warm and dry.  Psychiatric: He has a normal mood and affect.          Assessment & Plan:  Muscle Strain:  Located to right lower back since moving furniture for the past 2 weeks. Tenderness upon exam to right lower back. Negative straight leg raise. Will treat for muscle strain/spasm.  Discussed application of heat, stretching, avoiding intense exercise. Rx for Flexeril sent to pharmacy to use PRN. Follow up PRN.  Sheral Flow, NP

## 2016-03-08 ENCOUNTER — Other Ambulatory Visit: Payer: Self-pay | Admitting: Primary Care

## 2016-03-08 DIAGNOSIS — F411 Generalized anxiety disorder: Secondary | ICD-10-CM

## 2016-03-08 NOTE — Telephone Encounter (Signed)
Ok to refill? Electronically refill request for   hydrOXYzine (ATARAX/VISTARIL) 25 MG tablet  Take 1 tablet by mouth twice daily as needed for anxiety.  Last prescribed on 02/17/2016.

## 2016-03-09 MED ORDER — BUSPIRONE HCL 7.5 MG PO TABS: 7.5000 mg | ORAL_TABLET | Freq: Two times a day (BID) | ORAL | 0 refills | Status: DC

## 2016-03-09 MED ORDER — BUSPIRONE HCL 7.5 MG PO TABS: 7.5000 mg | ORAL_TABLET | Freq: Two times a day (BID) | ORAL | 1 refills | Status: DC

## 2016-03-09 NOTE — Addendum Note (Signed)
Addended by: Jacqualin Combes on: 03/09/2016 05:32 PM   Modules accepted: Orders

## 2016-03-17 ENCOUNTER — Ambulatory Visit (INDEPENDENT_AMBULATORY_CARE_PROVIDER_SITE_OTHER): Payer: BC Managed Care – PPO | Admitting: Primary Care

## 2016-03-17 VITALS — BP 118/84 | HR 74 | Temp 97.8°F | Ht 73.0 in | Wt 200.4 lb

## 2016-03-17 DIAGNOSIS — J029 Acute pharyngitis, unspecified: Secondary | ICD-10-CM

## 2016-03-17 LAB — POCT RAPID STREP A (OFFICE): Rapid Strep A Screen: NEGATIVE

## 2016-03-17 NOTE — Progress Notes (Signed)
Subjective:    Patient ID: Sergio Garcia, male    DOB: [birth year], 40 y.o.   MRN: [id number]  HPI  Sergio Garcia is a 40 year old male who presents today with a chief complaint of sore throat. His throat has been sore since yesterday. His girlfriend was diagnosed with strep throat 2 days ago and was place on antibiotic treatment yesterday. He denies fevers, chills, cough, ear pain. He's taken Nyquil with improvement in sleep.   Review of Systems  Constitutional: Negative for chills, fatigue and fever.  HENT: Positive for sore throat. Negative for congestion, ear pain and postnasal drip.   Respiratory: Negative for cough and shortness of breath.        Past Medical History:  Diagnosis Date  . Anxiety   . Degenerative disc disease   . Depression   . Enteritis   . Gastritis   . GERD (gastroesophageal reflux disease)   . Gilbert's syndrome    hyperbilirubinemia  . IBS (irritable bowel syndrome)   . Inguinal hernia   . Insomnia   . Prostatitis 2008     Social History   Social History  . Marital status: Married    Spouse name: N/A  . Number of children: 2  . Years of education: N/A   Occupational History  . 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 Smoking status: Never Smoker  . Smokeless tobacco: Never Used  . Alcohol use No  . Drug use: No  . Sexual activity: Not on file   Other Topics Concern  . Not on file   Social History Narrative   Married.   Lives in [place].   Recently accepted to [employer].   2 children.   Enjoys watching racing, Dealer, deer hunting.                 Past Surgical History:  Procedure Laterality Date  . BACK SURGERY     2 ruptured discs  . CHOLECYSTECTOMY  01/31/14  . ESOPHAGOGASTRODUODENOSCOPY  09/2004   gastritis acute and duodenitis  . ESOPHAGOGASTRODUODENOSCOPY  01-14-14   Dr Allen Norris  . EXPLORATORY LAPAROTOMY  Jan 2015   Dr Burt Knack    Family History  Problem Relation Age of Onset  . Colon  polyps Mother   . Cancer Mother     sinus cavity, on XRT  . Heart disease Father   . Heart attack Father     x 2  . Pancreatic cancer Maternal Grandfather     Allergies  Allergen Reactions  . Morphine And Related Anaphylaxis  . Morphine Nausea And Vomiting  . Tdap [Diphth-Acell Pertussis-Tetanus] Hives and Rash    Current Outpatient Prescriptions on File Prior to Visit  Medication Sig Dispense Refill  . busPIRone (BUSPAR) 7.5 MG tablet Take 1 tablet (7.5 mg total) by mouth 2 (two) times daily. 180 tablet 1  . cetirizine (ZYRTEC) 10 MG tablet Take 10 mg by mouth daily.    . cyclobenzaprine (FLEXERIL) 5 MG tablet Take 1 tablet (5 mg total) by mouth 3 (three) times daily as needed for muscle spasms. 30 tablet 0  . glycopyrrolate (ROBINUL) 1 MG tablet TAKE 1 TABLET (1 MG TOTAL) BY MOUTH 2 (TWO) TIMES DAILY AS NEEDED. 60 tablet 1  . hydrOXYzine (ATARAX/VISTARIL) 25 MG tablet TAKE 1 TABLET BY MOUTH TWICE DAILY AS NEEDED FOR ANXIETY. 180 tablet 1   No current facility-administered medications on file prior to visit.     BP  118/84   Pulse 74   Temp 97.8 F (36.6 C) (Oral)   Ht 6\' 1"  (1.854 m)   Wt 200 lb 6.4 oz (90.9 kg)   SpO2 98%   BMI 26.44 kg/m    Objective:   Physical Exam  Constitutional: He appears well-nourished.  HENT:  Right Ear: Tympanic membrane and ear canal normal.  Left Ear: Tympanic membrane and ear canal normal.  Nose: No mucosal edema. Right sinus exhibits no maxillary sinus tenderness and no frontal sinus tenderness. Left sinus exhibits no maxillary sinus tenderness and no frontal sinus tenderness.  Mouth/Throat: Oropharynx is clear and moist. No oropharyngeal exudate, posterior oropharyngeal edema or posterior oropharyngeal erythema.  Eyes: Conjunctivae are normal.  Neck: Neck supple.  Cardiovascular: Normal rate and regular rhythm.   Pulmonary/Chest: Effort normal and breath sounds normal. He has no wheezes. He has no rales.  Skin: Skin is warm and dry.            Assessment & Plan:  Sore Throat:   Present x 2 days, girlfriend recently diagnosed and treated with antibiotics. He has no other symptoms.  Exam today unremarkable except for 1 small lymph node. Oropharynx without erythema, exudate, edema. Rapid Strep: Negative Encouraged Ibuprofen, warm salt gargles, fluids. Return precautions provided.  Sheral Flow, NP

## 2016-03-17 NOTE — Patient Instructions (Signed)
Your strep test was negative which is good news.  Please notify me if you develop fevers, chills, increased pain to your throat, white spots to your throat.  It was a pleasure to see you today!

## 2016-03-17 NOTE — Progress Notes (Signed)
Pre visit review using our clinic review tool, if applicable. No additional management support is needed unless otherwise documented below in the visit note. 

## 2016-03-18 ENCOUNTER — Ambulatory Visit: Payer: Self-pay | Admitting: Family Medicine

## 2016-03-30 ENCOUNTER — Encounter: Payer: Self-pay | Admitting: Primary Care

## 2016-03-30 ENCOUNTER — Ambulatory Visit (INDEPENDENT_AMBULATORY_CARE_PROVIDER_SITE_OTHER): Payer: BC Managed Care – PPO | Admitting: Primary Care

## 2016-03-30 DIAGNOSIS — F411 Generalized anxiety disorder: Secondary | ICD-10-CM

## 2016-03-30 NOTE — Patient Instructions (Signed)
Continue Buspar everyday for anxiety.  Use the hydroxyzine as needed for breakthrough panic attacks.  Cough/Congestion: Try taking Mucinex DM or Delsym DM. This will help loosen up the mucous in your chest. Ensure you take this medication with a full glass of water.  Drainage: Consider an antihistamine such as Zyrtec at bedtime.  Nasal Congestion/Ear Pressure: Try using Flonase (fluticasone) nasal spray. Instill 1 spray in each nostril twice daily.   It was a pleasure to see you today!   Upper Respiratory Infection, Adult Most upper respiratory infections (URIs) are a viral infection of the air passages leading to the lungs. A URI affects the nose, throat, and upper air passages. The most common type of URI is nasopharyngitis and is typically referred to as "the common cold." URIs run their course and usually go away on their own. Most of the time, a URI does not require medical attention, but sometimes a bacterial infection in the upper airways can follow a viral infection. This is called a secondary infection. Sinus and middle ear infections are common types of secondary upper respiratory infections. Bacterial pneumonia can also complicate a URI. A URI can worsen asthma and chronic obstructive pulmonary disease (COPD). Sometimes, these complications can require emergency medical care and may be life threatening.  CAUSES Almost all URIs are caused by viruses. A virus is a type of germ and can spread from one person to another.  RISKS FACTORS You may be at risk for a URI if:   You smoke.   You have chronic heart or lung disease.  You have a weakened defense (immune) system.   You are very young or very old.   You have nasal allergies or asthma.  You work in crowded or poorly ventilated areas.  You work in health care facilities or schools. SIGNS AND SYMPTOMS  Symptoms typically develop 2-3 days after you come in contact with a cold virus. Most viral URIs last 7-10 days. However,  viral URIs from the influenza virus (flu virus) can last 14-18 days and are typically more severe. Symptoms may include:   Runny or stuffy (congested) nose.   Sneezing.   Cough.   Sore throat.   Headache.   Fatigue.   Fever.   Loss of appetite.   Pain in your forehead, behind your eyes, and over your cheekbones (sinus pain).  Muscle aches.  DIAGNOSIS  Your health care provider may diagnose a URI by:  Physical exam.  Tests to check that your symptoms are not due to another condition such as:  Strep throat.  Sinusitis.  Pneumonia.  Asthma. TREATMENT  A URI goes away on its own with time. It cannot be cured with medicines, but medicines may be prescribed or recommended to relieve symptoms. Medicines may help:  Reduce your fever.  Reduce your cough.  Relieve nasal congestion. HOME CARE INSTRUCTIONS   Take medicines only as directed by your health care provider.   Gargle warm saltwater or take cough drops to comfort your throat as directed by your health care provider.  Use a warm mist humidifier or inhale steam from a shower to increase air moisture. This may make it easier to breathe.  Drink enough fluid to keep your urine clear or pale yellow.   Eat soups and other clear broths and maintain good nutrition.   Rest as needed.   Return to work when your temperature has returned to normal or as your health care provider advises. You may need to stay home longer  to avoid infecting others. You can also use a face mask and careful hand washing to prevent spread of the virus.  Increase the usage of your inhaler if you have asthma.   Do not use any tobacco products, including cigarettes, chewing tobacco, or electronic cigarettes. If you need help quitting, ask your health care provider. PREVENTION  The best way to protect yourself from getting a cold is to practice good hygiene.   Avoid oral or hand contact with people with cold symptoms.   Wash  your hands often if contact occurs.  There is no clear evidence that vitamin C, vitamin E, echinacea, or exercise reduces the chance of developing a cold. However, it is always recommended to get plenty of rest, exercise, and practice good nutrition.  SEEK MEDICAL CARE IF:   You are getting worse rather than better.   Your symptoms are not controlled by medicine.   You have chills.  You have worsening shortness of breath.  You have brown or red mucus.  You have yellow or brown nasal discharge.  You have pain in your face, especially when you bend forward.  You have a fever.  You have swollen neck glands.  You have pain while swallowing.  You have white areas in the back of your throat. SEEK IMMEDIATE MEDICAL CARE IF:   You have severe or persistent:  Headache.  Ear pain.  Sinus pain.  Chest pain.  You have chronic lung disease and any of the following:  Wheezing.  Prolonged cough.  Coughing up blood.  A change in your usual mucus.  You have a stiff neck.  You have changes in your:  Vision.  Hearing.  Thinking.  Mood. MAKE SURE YOU:   Understand these instructions.  Will watch your condition.  Will get help right away if you are not doing well or get worse.   This information is not intended to replace advice given to you by your health care provider. Make sure you discuss any questions you have with your health care provider.   Document Released: 11/02/2000 Document Revised: 09/23/2014 Document Reviewed: 08/14/2013 Elsevier Interactive Patient Education [employer].

## 2016-03-30 NOTE — Assessment & Plan Note (Signed)
Improved on Buspar and PRN use of hydroxyzine. He's not used Clonazepam. Continue current regimen, will continue to monitor.

## 2016-03-30 NOTE — Progress Notes (Signed)
Subjective:    Patient ID: Sergio Garcia, male    DOB: 23-Jun-1975, 40 y.o.   MRN: [id number]  HPI  Sergio Garcia is a 40 year old male who presents today for follow up of generalized anxiety disorder. Presented as a new patient in late September 2017 with complaints of increased anxiety. He has failed treatment on Wellbutrin XL, Lexapro, and Effexor. He was initiated on Buspar last visit and encouraged to limit use of Clonazepam and was provided with a prescription for Hydroxyzine instead.   Since his last visit he has noticed overall improvement on Buspar such as "feeling better" feels happier, less worry, less anxiety. He has experienced 2 panic attacks in the past one week. He did use the hydroxyzine during panic attacks with improvement. He denies changes work or personal stress. He denies SI/HI and overall feels well managed.  2) Body Aches: Also with drainage, sore throat, cough. Stared over the weekend with drainage, sore throat. He began to cough yesterday. He denies fevers, chills, nausea, productive cough. He's been around his girlfriend who had strep two weeks ago. He's taken Dayquil/Nyquil without much improvement.   Review of Systems  Constitutional: Negative for chills and fever.  HENT: Positive for congestion, postnasal drip, rhinorrhea and sore throat. Negative for ear pain.   Respiratory: Positive for cough. Negative for shortness of breath.   Cardiovascular: Negative for chest pain.       Past Medical History:  Diagnosis Date  . Anxiety   . Degenerative disc disease   . Depression   . Enteritis   . Gastritis   . GERD (gastroesophageal reflux disease)   . Gilbert's syndrome    hyperbilirubinemia  . IBS (irritable bowel syndrome)   . Inguinal hernia   . Insomnia   . Prostatitis 2008     Social History   Social History  . Marital status: Married    Spouse name: N/A  . Number of children: 2  . Years of education: N/A   Occupational History  . Tax inspector [employer]   Social History Main Topics  . Smoking status: Never Smoker  . Smokeless tobacco: Never Used  . Alcohol use No  . Drug use: No  . Sexual activity: Not on file   Other Topics Concern  . Not on file   Social History Narrative   Married.   Lives in [place].   Recently accepted to [employer].   2 children.   Enjoys watching racing, Dealer, deer hunting.                 Past Surgical History:  Procedure Laterality Date  . BACK SURGERY     2 ruptured discs  . CHOLECYSTECTOMY  01/31/14  . ESOPHAGOGASTRODUODENOSCOPY  09/2004   gastritis acute and duodenitis  . ESOPHAGOGASTRODUODENOSCOPY  01-14-14   Dr Allen Norris  . EXPLORATORY LAPAROTOMY  Jan 2015   Dr Burt Knack    Family History  Problem Relation Age of Onset  . Colon polyps Mother   . Cancer Mother     sinus cavity, on XRT  . Heart disease Father   . Heart attack Father     x 2  . Pancreatic cancer Maternal Grandfather     Allergies  Allergen Reactions  . Morphine And Related Anaphylaxis  . Morphine Nausea And Vomiting  . Tdap [Diphth-Acell Pertussis-Tetanus] Hives and Rash    Current Outpatient Prescriptions on File Prior to Visit  Medication Sig Dispense Refill  . busPIRone (  BUSPAR) 7.5 MG tablet Take 1 tablet (7.5 mg total) by mouth 2 (two) times daily. 180 tablet 1  . cetirizine (ZYRTEC) 10 MG tablet Take 10 mg by mouth daily.    . cyclobenzaprine (FLEXERIL) 5 MG tablet Take 1 tablet (5 mg total) by mouth 3 (three) times daily as needed for muscle spasms. 30 tablet 0  . glycopyrrolate (ROBINUL) 1 MG tablet TAKE 1 TABLET (1 MG TOTAL) BY MOUTH 2 (TWO) TIMES DAILY AS NEEDED. 60 tablet 1  . hydrOXYzine (ATARAX/VISTARIL) 25 MG tablet TAKE 1 TABLET BY MOUTH TWICE DAILY AS NEEDED FOR ANXIETY. 180 tablet 1   No current facility-administered medications on file prior to visit.     BP 136/84   Pulse 80   Temp 97.8 F (36.6 C) (Oral)   Ht 6\' 1"  (1.854 m)   Wt 198 lb 1.9 oz (89.9 kg)    SpO2 97%   BMI 26.14 kg/m    Objective:   Physical Exam  Constitutional: He appears well-nourished.  HENT:  Right Ear: Tympanic membrane and ear canal normal.  Left Ear: Tympanic membrane and ear canal normal.  Nose: No mucosal edema. Right sinus exhibits no maxillary sinus tenderness and no frontal sinus tenderness. Left sinus exhibits no maxillary sinus tenderness and no frontal sinus tenderness.  Mouth/Throat: Oropharynx is clear and moist.  Eyes: Conjunctivae are normal.  Neck: Neck supple.  Cardiovascular: Normal rate and regular rhythm.   Pulmonary/Chest: Effort normal and breath sounds normal. He has no wheezes. He has no rales.  Skin: Skin is warm and dry.  Psychiatric: He has a normal mood and affect.          Assessment & Plan:  URI:  Sore throat, congestion, cough x 2-3 days. OTC treatment without improvement. Exam today stable. Clear lungs, does not appear acutely ill. Suspect viral URI vs allergic rhinitis. Will treat with supportive measures. Discussed Mucinex, Delsym, Zyrtec, fluids, rest. Follow up PRN.  Sheral Flow, NP

## 2016-03-30 NOTE — Progress Notes (Signed)
Pre visit review using our clinic review tool, if applicable. No additional management support is needed unless otherwise documented below in the visit note. 

## 2016-05-10 ENCOUNTER — Ambulatory Visit (INDEPENDENT_AMBULATORY_CARE_PROVIDER_SITE_OTHER): Payer: Self-pay | Admitting: Urgent Care

## 2016-05-10 VITALS — BP 122/72 | HR 70 | Temp 98.2°F | Resp 17 | Ht 74.5 in | Wt 193.0 lb

## 2016-05-10 DIAGNOSIS — K589 Irritable bowel syndrome without diarrhea: Secondary | ICD-10-CM

## 2016-05-10 DIAGNOSIS — F419 Anxiety disorder, unspecified: Secondary | ICD-10-CM

## 2016-05-10 DIAGNOSIS — Z024 Encounter for examination for driving license: Secondary | ICD-10-CM

## 2016-05-10 NOTE — Progress Notes (Signed)
Commercial Driver Medical Examination   Sergio Garcia is a 40 y.o. male who presents today for a DOT physical exam. The patient reports history of ruptured disc, s/p back surgery 2009, left forearm fracture as a child, all without sequelae. Also had cholecystectomy in 2015, also without sequelae. Reports history of anxiety managed with Wellbutrin XL BID, Klonopin 0.5mg  daily PRN. Uses Klonopin every other day on average. Denies experiencing drowsiness, AMS. Drinks alcohol occasionally. Denies dizziness, chronic headache, blurred vision, chest pain, shortness of breath, heart racing, palpitations, nausea, vomiting, abdominal pain, hematuria, lower leg swelling.   The following portions of the patient's history were reviewed and updated as appropriate: allergies, current medications, past family history, past medical history, past social history and past surgical history.  Objective:   BP 122/72 (BP Location: Right Arm, Patient Position: Sitting, Cuff Size: Normal)   Pulse 70   Temp 98.2 F (36.8 C) (Oral)   Resp 17   Ht 6' 2.5" (1.892 m)   Wt 193 lb (87.5 kg)   SpO2 97%   BMI 24.45 kg/m   Vision/hearing:  Visual Acuity Screening   Right eye Left eye Both eyes  Without correction: 20/20 20/20 20/20   With correction:     Hearing Screening Comments: Peripheral Vision: Right eye 85 degrees. Left eye 85 degrees. The patient can distinguish the colors red, amber and green. The patient was able to hear a forced whisper from L=10 R=10  feet.  Patient can recognize and distinguish among traffic control signals and devices showing standard red, green, and amber colors.  Corrective lenses required: No  Monocular Vision?: No  Hearing aid requirement: No  Physical Exam  Constitutional: He is oriented to person, place, and time. He appears well-developed and well-nourished.  HENT:  TM's intact bilaterally, no effusions or erythema. Nasal turbinates pink and moist, nasal passages patent.  No sinus tenderness. Oropharynx clear, mucous membranes moist, dentition in good repair.  Eyes: Conjunctivae and EOM are normal. Pupils are equal, round, and reactive to light. Right eye exhibits no discharge. Left eye exhibits no discharge. No scleral icterus.  Neck: Normal range of motion. Neck supple. No thyromegaly present.  Cardiovascular: Normal rate, regular rhythm and intact distal pulses.  Exam reveals no gallop and no friction rub.   No murmur heard. Pulmonary/Chest: No stridor. No respiratory distress. He has no wheezes. He has no rales.  Abdominal: Soft. Bowel sounds are normal. He exhibits no distension and no mass. There is no tenderness.  Well-healed laparoscopic surgical scars over abdomen.  Musculoskeletal: Normal range of motion. He exhibits no edema or tenderness.  Well healed vertical surgical scar in lumbar region.  Lymphadenopathy:    He has no cervical adenopathy.  Neurological: He is alert and oriented to person, place, and time. He has normal reflexes.  Skin: Skin is warm and dry. No rash noted. No erythema. No pallor.  Psychiatric: He has a normal mood and affect.   Labs: Comments: Sp Gr  1.015;  Protein-neg; Blood-neg;  Sugar-neg  Assessment:    Healthy male exam.  Meets standards in 77 CFR 391.41;  qualifies for 2 year certificate.    Plan:   Medical examiners certificate completed and printed. Return as needed.

## 2016-05-10 NOTE — Patient Instructions (Signed)

## 2016-05-28 ENCOUNTER — Telehealth: Payer: Self-pay

## 2016-05-28 NOTE — Telephone Encounter (Signed)
Butch Penny called pt this to let him know that a mistake was made on the DOT card Mani's license number was wrong .  He should be coming to have the card changed   The DOT and new card is in [place] office

## 2016-05-31 ENCOUNTER — Telehealth: Payer: Self-pay

## 2016-05-31 NOTE — Telephone Encounter (Signed)
Put a my chart message for the patient regarding his DOT Card.  The Form is at the TL Desk at check in

## 2016-06-07 ENCOUNTER — Encounter: Payer: Self-pay | Admitting: Internal Medicine

## 2016-06-23 ENCOUNTER — Ambulatory Visit: Payer: Self-pay | Admitting: Family Medicine

## 2016-10-06 ENCOUNTER — Ambulatory Visit (INDEPENDENT_AMBULATORY_CARE_PROVIDER_SITE_OTHER): Payer: Managed Care, Other (non HMO) | Admitting: Family Medicine

## 2016-10-06 ENCOUNTER — Encounter: Payer: Self-pay | Admitting: Family Medicine

## 2016-10-06 VITALS — BP 120/88 | HR 75 | Temp 98.0°F | Ht 74.5 in | Wt 184.0 lb

## 2016-10-06 DIAGNOSIS — M549 Dorsalgia, unspecified: Secondary | ICD-10-CM | POA: Insufficient documentation

## 2016-10-06 DIAGNOSIS — M545 Low back pain, unspecified: Secondary | ICD-10-CM

## 2016-10-06 DIAGNOSIS — R350 Frequency of micturition: Secondary | ICD-10-CM

## 2016-10-06 LAB — COMPREHENSIVE METABOLIC PANEL
ALBUMIN: 4.2 g/dL (ref 3.5–5.2)
ALT: 15 U/L (ref 0–53)
AST: 14 U/L (ref 0–37)
Alkaline Phosphatase: 42 U/L (ref 39–117)
BILIRUBIN TOTAL: 1.1 mg/dL (ref 0.2–1.2)
BUN: 15 mg/dL (ref 6–23)
CALCIUM: 9.6 mg/dL (ref 8.4–10.5)
CO2: 34 meq/L — AB (ref 19–32)
CREATININE: 1.07 mg/dL (ref 0.40–1.50)
Chloride: 102 mEq/L (ref 96–112)
GFR: 81.87 mL/min (ref >60.00)
Glucose, Bld: 132 mg/dL — ABNORMAL HIGH (ref 70–99)
Potassium: 4.1 mEq/L (ref 3.5–5.1)
Sodium: 140 mEq/L (ref 135–145)
TOTAL PROTEIN: 6.8 g/dL (ref 6.0–8.3)

## 2016-10-06 LAB — POC URINALSYSI DIPSTICK (AUTOMATED)
BILIRUBIN UA: NEGATIVE
Blood, UA: NEGATIVE
GLUCOSE UA: NEGATIVE
Ketones, UA: NEGATIVE
LEUKOCYTES UA: NEGATIVE
NITRITE UA: NEGATIVE
PH UA: 7 (ref 5.0–8.0)
Protein, UA: NEGATIVE
Spec Grav, UA: 1.015 (ref 1.010–1.025)
Urobilinogen, UA: 0.2 E.U./dL

## 2016-10-06 NOTE — Patient Instructions (Signed)
Great to see you. Please stop by to see Sergio Garcia on your way out.   

## 2016-10-06 NOTE — Progress Notes (Signed)
Subjective:   Patient ID: Sergio Garcia, male    DOB: 29-Mar-1976, 41 y.o.   MRN: [id number]  Sergio Garcia is a pleasant 41 y.o. year old male who presents to clinic today with Back Pain  on 10/06/2016  HPI:  Past 3 weeks, intermittent right sided back pain.   Severe- can be a 10/10.  Does not radiate to his abdomen or groin. Feels it is not muscular but "internal." He has also had increased urinary frequency and incomplete bladder emptying.  He does not have a personal history of kidney stones but his mother does.   Current Outpatient Prescriptions on File Prior to Visit  Medication Sig Dispense Refill  . buPROPion (WELLBUTRIN XL) 150 MG 24 hr tablet Take 1 tablet by mouth 2 (two) times daily.  3  . clonazePAM (KLONOPIN) 0.5 MG tablet Take 1 tablet by mouth daily as needed.    Marland Kitchen glycopyrrolate (ROBINUL) 1 MG tablet TAKE 1 TABLET (1 MG TOTAL) BY MOUTH 2 (TWO) TIMES DAILY AS NEEDED. 60 tablet 1   No current facility-administered medications on file prior to visit.     Allergies  Allergen Reactions  . Morphine And Related Anaphylaxis  . Morphine Nausea And Vomiting  . Tdap [Diphth-Acell Pertussis-Tetanus] Hives and Rash    Past Medical History:  Diagnosis Date  . Anxiety   . Degenerative disc disease   . Depression   . Enteritis   . Gastritis   . GERD (gastroesophageal reflux disease)   . Gilbert's syndrome    hyperbilirubinemia  . IBS (irritable bowel syndrome)   . Inguinal hernia   . Insomnia   . Prostatitis 2008    Past Surgical History:  Procedure Laterality Date  . BACK SURGERY     2 ruptured discs  . CHOLECYSTECTOMY  01/31/14  . ESOPHAGOGASTRODUODENOSCOPY  09/2004   gastritis acute and duodenitis  . ESOPHAGOGASTRODUODENOSCOPY  01-14-14   Dr Allen Norris  . EXPLORATORY LAPAROTOMY  Jan 2015   Dr Burt Knack    Family History  Problem Relation Age of Onset  . Colon polyps Mother   . Cancer Mother        sinus cavity, on XRT  . Heart disease Father   . Heart  attack Father        x 2  . Pancreatic cancer Maternal Grandfather     Social History   Social History  . Marital status: Married    Spouse name: N/A  . Number of children: 2  . Years of education: N/A   Occupational History  . 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 Smoking status: Never Smoker  . Smokeless tobacco: Never Used  . Alcohol use No  . Drug use: No  . Sexual activity: Not on file   Other Topics Concern  . Not on file   Social History Narrative   Married.   Lives in [place].   Recently accepted to [employer].   2 children.   Enjoys watching racing, Dealer, deer hunting.                The PMH, PSH, Social History, Family History, Medications, and allergies have been reviewed in [facility], and have been updated if relevant. +  Review of Systems  Constitutional: Negative.   Genitourinary: Positive for difficulty urinating, flank pain and frequency. Negative for decreased urine volume, discharge, dysuria, enuresis, genital sores, hematuria, penile pain, penile swelling, scrotal swelling, testicular pain and urgency.  All  other systems reviewed and are negative.      Objective:    BP 120/88   Pulse 75   Temp 98 F (36.7 C)   Ht 6' 2.5" (1.892 m)   Wt 184 lb (83.5 kg)   SpO2 99%   BMI 23.31 kg/m    Physical Exam  Constitutional: He is oriented to person, place, and time. He appears well-developed and well-nourished. No distress.  HENT:  Head: Normocephalic and atraumatic.  Eyes: Conjunctivae are normal.  Cardiovascular: Normal rate.   Pulmonary/Chest: Effort normal.  Abdominal: Soft. Bowel sounds are normal.  Musculoskeletal: Normal range of motion. He exhibits no tenderness.  Neurological: He is alert and oriented to person, place, and time. No cranial nerve deficit.  Skin: Skin is warm and dry. He is not diaphoretic.  Psychiatric: He has a normal mood and affect. His behavior is normal. Judgment and thought  content normal.  Nursing note and vitals reviewed.         Assessment & Plan:   Right low back pain, unspecified chronicity, with sciatica presence unspecified - Plan: POCT Urinalysis Dipstick (Automated) No Follow-up on file.

## 2016-10-06 NOTE — Assessment & Plan Note (Signed)
With associated urinary frequency. UA neg. Proceed with CMET and renal stone CT. The patient indicates understanding of these issues and agrees with the plan.

## 2016-10-07 ENCOUNTER — Ambulatory Visit
Admission: RE | Admit: 2016-10-07 | Discharge: 2016-10-07 | Disposition: A | Discharge status: Home / Self Care | Payer: Managed Care, Other (non HMO) | Source: Ambulatory Visit | Attending: Family Medicine | Admitting: Family Medicine

## 2016-10-07 DIAGNOSIS — M545 Low back pain: Secondary | ICD-10-CM

## 2016-10-18 ENCOUNTER — Ambulatory Visit: Payer: Managed Care, Other (non HMO) | Admitting: Primary Care

## 2016-10-18 DIAGNOSIS — Z0289 Encounter for other administrative examinations: Secondary | ICD-10-CM

## 2016-10-19 ENCOUNTER — Telehealth: Payer: Self-pay

## 2016-10-19 NOTE — Telephone Encounter (Signed)
Pt left v/m; pt had CT on 10/07/16 and pt was seen 10/06/16 and Dr Deborra Medina advised pt to f/u with PCP. Pt is in pain with bulging disc in back and pt wants to get MRI recommended due to back pain. Pt request cb.

## 2016-10-19 NOTE — Telephone Encounter (Signed)
Patient notified as instructed by telephone and verbalized understanding. Appointment scheduled for 10/24/16.

## 2016-10-19 NOTE — Telephone Encounter (Signed)
He had an appointment scheduled with me on May 29 for which he did not show. Please have him reschedule so we can discuss his symptoms.

## 2016-10-24 ENCOUNTER — Encounter: Payer: Self-pay | Admitting: Primary Care

## 2016-10-24 ENCOUNTER — Other Ambulatory Visit: Payer: Self-pay | Admitting: Primary Care

## 2016-10-24 ENCOUNTER — Other Ambulatory Visit: Payer: Self-pay | Admitting: Family Medicine

## 2016-10-24 ENCOUNTER — Ambulatory Visit (INDEPENDENT_AMBULATORY_CARE_PROVIDER_SITE_OTHER): Payer: Managed Care, Other (non HMO) | Admitting: Primary Care

## 2016-10-24 VITALS — BP 116/80 | HR 92 | Temp 98.5°F | Ht 74.5 in | Wt 184.1 lb

## 2016-10-24 DIAGNOSIS — G4726 Circadian rhythm sleep disorder, shift work type: Secondary | ICD-10-CM

## 2016-10-24 DIAGNOSIS — M5126 Other intervertebral disc displacement, lumbar region: Secondary | ICD-10-CM

## 2016-10-24 DIAGNOSIS — Z77018 Contact with and (suspected) exposure to other hazardous metals: Secondary | ICD-10-CM

## 2016-10-24 DIAGNOSIS — M5441 Lumbago with sciatica, right side: Secondary | ICD-10-CM

## 2016-10-24 DIAGNOSIS — F411 Generalized anxiety disorder: Secondary | ICD-10-CM | POA: Diagnosis not present

## 2016-10-24 MED ORDER — TRAZODONE HCL 50 MG PO TABS: 50.0000 mg | ORAL_TABLET | Freq: Every evening | ORAL | 0 refills | Status: DC | PRN

## 2016-10-24 MED ORDER — BUPROPION HCL ER (XL) 150 MG PO TB24: 150.0000 mg | ORAL_TABLET | Freq: Every day | ORAL | 1 refills | Status: DC

## 2016-10-24 NOTE — Patient Instructions (Signed)
I sent refills of your Wellbutrin XL 150 mg tablets to your pharmacy. Take 1 tablet by mouth daily.  You may try Trazodone 50 mg tablets for sleep. Take 1 tablet by mouth at bedtime as needed. You may increase up to two tablets at bedtime if one isn't enough. Please update me if no improvement.  Stop by the front desk and speak with either Rosaria Ferries or Shirlean Mylar regarding your MRI.  It was a pleasure to see you today!

## 2016-10-24 NOTE — Assessment & Plan Note (Signed)
Recent CT scan with evidence of bulging disks to L4-L5. Given history of surgery, presence of symptoms, and recent CT, report will order MRI.

## 2016-10-24 NOTE — Assessment & Plan Note (Signed)
Discussed that he should not have stopped his medicine without notifying our staff. Discussed that I will not prescribe clonazepam. Prescription for Wellbutrin XL150 mg sent to pharmacy. Prescription for trazodone 50 mg tablets sent to pharmacy to use as needed for insomnia. He will update in several weeks in regards to insomnia. He denies SI/HI.

## 2016-10-24 NOTE — Progress Notes (Signed)
Subjective:    Patient ID: Sergio Garcia, male    DOB: November 02, 1975, 41 y.o.   MRN: [id number]  HPI  Sergio Garcia is a 41 year old male who presents today for follow up.  1) Back Pain: Evaluated in our office on 10/06/16 by Dr. Deborra Medina for a chief complaint of back pain. He also endorsed urinary frequency. His UA was negative so he was sent for CT renal stone and CMET given strong family history of renal stones. His renal stone study was negative but there was evidence of a broad-based disc protrusion at the L4-L5 which is narrowing the right lateral recess. The CT recommended MRI for further evaluation.  His pain is located to the right lower back with radiation to his right lower extremity. His pain began about 3-4 weeks. He tired icing, applying heat, relaxing, taking tylenol, muscle relaxer's, and Ibuprofen without much improvement. He sits most of the day at work. He denies numbness/tingling and weakness to his right lower extremity, recent injury or trauma. He has a history of low back surgery (discectomy) in July 2009.   2) GAD: Currently managed on Wellbutrin XL 150 mg once daily for which he's not taken in one month. Previously managed on Clonazepam which was discontinued during his initial visit in mid 2017, he's not had this in one year.   Since he's not had his Wellbutrin he's having a hard time dealing with stress. GAD 7 score of 14 today. He experiences panic attacks 3-4 times monthly. He has difficulty sleeping 2 out of 4 weeks of the month as he switches back and forth from day and night shift at work. He cannot sleep much when he's on his night shift rotation. He's tried Melatonin, benadryl without improvement. He was once managed on Ambien but this caused him to feel bad.   Review of Systems  Respiratory: Negative for shortness of breath.   Cardiovascular: Negative for chest pain and palpitations.  Musculoskeletal: Positive for back pain.  Skin: Negative for color change.    Neurological: Negative for weakness and numbness.  Psychiatric/Behavioral: Positive for sleep disturbance. Negative for suicidal ideas. The patient is nervous/anxious.        Past Medical History:  Diagnosis Date  . Anxiety   . Degenerative disc disease   . Depression   . Enteritis   . Gastritis   . GERD (gastroesophageal reflux disease)   . Gilbert's syndrome    hyperbilirubinemia  . IBS (irritable bowel syndrome)   . Inguinal hernia   . Insomnia   . Prostatitis 2008     Social History   Social History  . Marital status: Married    Spouse name: N/A  . Number of children: 2  . Years of education: N/A   Occupational History  . 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 Smoking status: Never Smoker  . Smokeless tobacco: Never Used  . Alcohol use No  . Drug use: No  . Sexual activity: Not on file   Other Topics Concern  . Not on file   Social History Narrative   Married.   Lives in [place].   Recently accepted to [employer].   2 children.   Enjoys watching racing, Dealer, deer hunting.                 Past Surgical History:  Procedure Laterality Date  . BACK SURGERY     2 ruptured discs  . CHOLECYSTECTOMY  01/31/14  .  ESOPHAGOGASTRODUODENOSCOPY  09/2004   gastritis acute and duodenitis  . ESOPHAGOGASTRODUODENOSCOPY  01-14-14   Dr Allen Norris  . EXPLORATORY LAPAROTOMY  Jan 2015   Dr Burt Knack    Family History  Problem Relation Age of Onset  . Colon polyps Mother   . Cancer Mother        sinus cavity, on XRT  . Heart disease Father   . Heart attack Father        x 2  . Pancreatic cancer Maternal Grandfather     Allergies  Allergen Reactions  . Morphine And Related Anaphylaxis  . Morphine Nausea And Vomiting  . Tdap [Diphth-Acell Pertussis-Tetanus] Hives and Rash    Current Outpatient Prescriptions on File Prior to Visit  Medication Sig Dispense Refill  . glycopyrrolate (ROBINUL) 1 MG tablet TAKE 1 TABLET (1 MG TOTAL)  BY MOUTH 2 (TWO) TIMES DAILY AS NEEDED. 60 tablet 1   No current facility-administered medications on file prior to visit.     BP 116/80   Pulse 92   Temp 98.5 F (36.9 C) (Oral)   Ht 6' 2.5" (1.892 m)   Wt 184 lb 1.9 oz (83.5 kg)   SpO2 97%   BMI 23.32 kg/m    Objective:   Physical Exam  Constitutional: He appears well-nourished. He does not appear ill.  Neck: Neck supple.  Cardiovascular: Normal rate and regular rhythm.   Pulmonary/Chest: Effort normal and breath sounds normal.  Abdominal: There is no CVA tenderness.  Musculoskeletal:       Lumbar back: He exhibits decreased range of motion and pain. He exhibits no spasm.       Back:  Negative straight leg raise bilaterally. Strength 5/5 to lower extremities bilaterally.  Skin: Skin is warm and dry.          Assessment & Plan:

## 2016-10-25 ENCOUNTER — Encounter: Payer: Self-pay | Admitting: Primary Care

## 2016-10-25 DIAGNOSIS — F411 Generalized anxiety disorder: Secondary | ICD-10-CM

## 2016-10-25 MED ORDER — HYDROXYZINE HCL 10 MG PO TABS: 10.0000 mg | ORAL_TABLET | Freq: Two times a day (BID) | ORAL | 0 refills | Status: DC | PRN

## 2016-11-04 ENCOUNTER — Other Ambulatory Visit: Payer: Managed Care, Other (non HMO)

## 2016-11-07 ENCOUNTER — Telehealth: Payer: Self-pay | Admitting: Primary Care

## 2016-11-07 NOTE — Telephone Encounter (Signed)
Tried to call patient but voicemail box is full and unable to leave message.

## 2016-11-07 NOTE — Telephone Encounter (Signed)
Please notify patient that I'm working with his insurance company to get the MRI approved. What type of surgery did he have to his back? What was the name? The surgical history says "back surgery" but I'm needing more details.

## 2016-11-08 NOTE — Telephone Encounter (Signed)
Will close encounter. The information was needed yesterday for the insurance company.

## 2016-11-09 ENCOUNTER — Ambulatory Visit
Admission: RE | Admit: 2016-11-09 | Discharge: 2016-11-09 | Disposition: A | Discharge status: Home / Self Care | Payer: Managed Care, Other (non HMO) | Source: Ambulatory Visit | Attending: Primary Care | Admitting: Primary Care

## 2016-11-09 DIAGNOSIS — Z77018 Contact with and (suspected) exposure to other hazardous metals: Secondary | ICD-10-CM

## 2016-11-09 DIAGNOSIS — M5441 Lumbago with sciatica, right side: Secondary | ICD-10-CM

## 2016-11-09 MED ORDER — GADOBENATE DIMEGLUMINE 529 MG/ML IV SOLN
15.0000 mL | Freq: Once | INTRAVENOUS | Status: AC | PRN
  Administered 2016-11-09: 15 mL via INTRAVENOUS

## 2016-11-17 ENCOUNTER — Other Ambulatory Visit: Payer: Self-pay | Admitting: Primary Care

## 2016-11-17 DIAGNOSIS — F411 Generalized anxiety disorder: Secondary | ICD-10-CM

## 2016-11-18 MED ORDER — HYDROXYZINE HCL 10 MG PO TABS: 10.0000 mg | ORAL_TABLET | Freq: Two times a day (BID) | ORAL | 0 refills | Status: DC | PRN

## 2016-11-29 ENCOUNTER — Encounter: Payer: Self-pay | Admitting: Primary Care

## 2016-11-29 DIAGNOSIS — G8929 Other chronic pain: Secondary | ICD-10-CM

## 2016-11-29 DIAGNOSIS — M545 Low back pain: Principal | ICD-10-CM

## 2016-12-01 ENCOUNTER — Encounter: Payer: Self-pay | Admitting: Primary Care

## 2016-12-01 NOTE — Telephone Encounter (Signed)
Sergio Garcia, see My Chart message regarding neurosurgery referral. Can you help?

## 2016-12-02 ENCOUNTER — Other Ambulatory Visit: Payer: Self-pay | Admitting: Primary Care

## 2016-12-02 DIAGNOSIS — G8929 Other chronic pain: Secondary | ICD-10-CM

## 2016-12-02 DIAGNOSIS — M545 Low back pain: Principal | ICD-10-CM

## 2016-12-02 MED ORDER — METHOCARBAMOL 500 MG PO TABS: 500.0000 mg | ORAL_TABLET | Freq: Three times a day (TID) | ORAL | 0 refills | Status: DC | PRN

## 2016-12-09 ENCOUNTER — Other Ambulatory Visit: Payer: Self-pay | Admitting: Primary Care

## 2016-12-09 ENCOUNTER — Encounter: Payer: Self-pay | Admitting: Primary Care

## 2016-12-09 DIAGNOSIS — F411 Generalized anxiety disorder: Secondary | ICD-10-CM

## 2016-12-09 MED ORDER — HYDROXYZINE HCL 25 MG PO TABS: 25.0000 mg | ORAL_TABLET | Freq: Three times a day (TID) | ORAL | 0 refills | Status: DC | PRN

## 2016-12-09 NOTE — Telephone Encounter (Signed)
Ok to refill? Electronically refill request for hydrOXYzine (ATARAX/VISTARIL) 10 MG tablet.  Last prescribed on 11/18/2016. Last seen on 10/24/2016

## 2016-12-09 NOTE — Telephone Encounter (Signed)
Will see if patient would like an increased dose. My chart message sent.

## 2016-12-14 ENCOUNTER — Telehealth: Payer: Self-pay | Admitting: General Practice

## 2016-12-14 NOTE — Telephone Encounter (Signed)
Called patient cell phone [phone] and left a message regarding getting a new DOT card with correct medical license number on it for [facility] and if not please come by and get corrected card which is waiting in [place] folder up front at [address] under the short counter

## 2016-12-22 ENCOUNTER — Encounter: Payer: Self-pay | Admitting: Primary Care

## 2016-12-22 DIAGNOSIS — G8929 Other chronic pain: Secondary | ICD-10-CM

## 2016-12-22 DIAGNOSIS — M545 Low back pain: Principal | ICD-10-CM

## 2016-12-22 MED ORDER — CYCLOBENZAPRINE HCL 10 MG PO TABS: 10.0000 mg | ORAL_TABLET | Freq: Three times a day (TID) | ORAL | 0 refills | Status: DC | PRN

## 2016-12-28 ENCOUNTER — Ambulatory Visit (INDEPENDENT_AMBULATORY_CARE_PROVIDER_SITE_OTHER): Payer: Managed Care, Other (non HMO) | Admitting: Primary Care

## 2016-12-28 ENCOUNTER — Encounter: Payer: Self-pay | Admitting: Primary Care

## 2016-12-28 VITALS — BP 116/78 | HR 103 | Temp 97.6°F | Wt 176.4 lb

## 2016-12-28 DIAGNOSIS — R110 Nausea: Secondary | ICD-10-CM | POA: Diagnosis not present

## 2016-12-28 MED ORDER — ONDANSETRON 4 MG PO TBDP: 4.0000 mg | ORAL_TABLET | Freq: Three times a day (TID) | ORAL | 0 refills | Status: DC | PRN

## 2016-12-28 NOTE — Patient Instructions (Signed)
Start ondansetron (Zofran). Melt one tablet under the tongue every 8 hours as needed for nausea/vomiting.  You must work on hydrating with water.   Please call me if no improvement in diarrhea by Friday and/or sooner if you can't take in any water or food.  It was a pleasure to see you today!

## 2016-12-28 NOTE — Progress Notes (Signed)
Subjective:    Patient ID: Sergio Garcia, male    DOB: 1976-02-08, 41 y.o.   MRN: [id number]  HPI  Sergio Garcia is a 41 year old male who presents today with a chief complaint of fever. He also reports lower mid abdominal pain, diarrhea, nausea. Last night his fever was 101.9. He's taken 400 mg of Ibuprofen last night and this morning.  He denies cough, sore throat, bloody stools, sick contacts. He's not had much to eat or drink in the last 24 hours. His symptoms began yesterday.   Review of Systems  Constitutional: Positive for fatigue and fever.  Gastrointestinal: Positive for abdominal pain, diarrhea and nausea.  Neurological: Positive for headaches.       Past Medical History:  Diagnosis Date  . Anxiety   . Degenerative disc disease   . Depression   . Enteritis   . Gastritis   . GERD (gastroesophageal reflux disease)   . Gilbert's syndrome    hyperbilirubinemia  . IBS (irritable bowel syndrome)   . Inguinal hernia   . Insomnia   . Prostatitis 2008     Social History   Social History  . Marital status: Married    Spouse name: N/A  . Number of children: 2  . Years of education: N/A   Occupational History  . 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 Smoking status: Never Smoker  . Smokeless tobacco: Never Used  . Alcohol use No  . Drug use: No  . Sexual activity: Not on file   Other Topics Concern  . Not on file   Social History Narrative   Married.   [place]ives in [place]   Recently accepted to [place] [employer].   2 children.   Enjoys watching racing, Dealer, deer hunting.                 Past Surgical History:  Procedure Laterality Date  . BACK SURGERY     2 ruptured discs  . CHOLECYSTECTOMY  01/31/14  . ESOPHAGOGASTRODUODENOSCOPY  09/2004   gastritis acute and duodenitis  . ESOPHAGOGASTRODUODENOSCOPY  01-14-14   Dr Allen Norris  . EXPLORATORY LAPAROTOMY  Jan 2015   Dr Burt Knack    Family History  Problem Relation Age of Onset  .  Colon polyps Mother   . Cancer Mother        sinus cavity, on XRT  . Heart disease Father   . Heart attack Father        x 2  . Pancreatic cancer Maternal Grandfather     Allergies  Allergen Reactions  . Morphine And Related Anaphylaxis  . Morphine Nausea And Vomiting  . Tdap [Diphth-Acell Pertussis-Tetanus] Hives and Rash    Current Outpatient Prescriptions on File Prior to Visit  Medication Sig Dispense Refill  . buPROPion (WELLBUTRIN XL) 150 MG 24 hr tablet Take 1 tablet (150 mg total) by mouth daily. 90 tablet 1  . cyclobenzaprine (FLEXERIL) 10 MG tablet Take 1 tablet (10 mg total) by mouth 3 (three) times daily as needed for muscle spasms. 90 tablet 0  . glycopyrrolate (ROBINUL) 1 MG tablet TAKE 1 TABLET (1 MG TOTAL) BY MOUTH 2 (TWO) TIMES DAILY AS NEEDED. 60 tablet 1  . hydrOXYzine (ATARAX/VISTARIL) 25 MG tablet Take 1-2 tablets (25-50 mg total) by mouth every 8 (eight) hours as needed for anxiety. 180 tablet 0  . traZODone (DESYREL) 50 MG tablet Take 1 tablet (50 mg total) by mouth at bedtime as  needed for sleep. 90 tablet 0   No current facility-administered medications on file prior to visit.     BP 116/78   Pulse (!) 103   Temp 97.6 F (36.4 C) (Oral)   Wt 176 lb 6.4 oz (80 kg)   SpO2 98%   BMI 22.35 kg/m    Objective:   Physical Exam  Constitutional: He appears well-nourished. He does not appear ill.  Neck: Neck supple.  Cardiovascular: Normal rate and regular rhythm.   Pulmonary/Chest: Effort normal and breath sounds normal.  Abdominal: Soft. Normal appearance. Bowel sounds are increased. There is tenderness in the periumbilical area and left upper quadrant.          Assessment & Plan:  Viral GI Infection:  Abdominal pain, nausea, fever, diarrhea x 12 hours.  Improvement in fever with Ibuprofen. Exam today not suspicious for acute appendicitis, diverticulitis. Suspect viral gastritis and will treat with supportive measures. Rx for Zofran sent to  pharmacy. Continue Ibuprofen PRN fevers. Discussed importance of hydration. Work note provided for the next 2 days as he will need to rest. He will update if no improvement by Friday this week. He is stable for outpatient treatment.  Sheral Flow, NP

## 2016-12-29 ENCOUNTER — Encounter: Payer: Self-pay | Admitting: Primary Care

## 2016-12-29 ENCOUNTER — Emergency Department
Admission: EM | Admit: 2016-12-29 | Discharge: 2016-12-29 | Disposition: A | Discharge status: Home / Self Care | Payer: Managed Care, Other (non HMO) | Attending: Emergency Medicine | Admitting: Emergency Medicine

## 2016-12-29 ENCOUNTER — Encounter: Payer: Self-pay | Admitting: Emergency Medicine

## 2016-12-29 DIAGNOSIS — Z79899 Other long term (current) drug therapy: Secondary | ICD-10-CM | POA: Diagnosis not present

## 2016-12-29 DIAGNOSIS — R197 Diarrhea, unspecified: Secondary | ICD-10-CM

## 2016-12-29 DIAGNOSIS — K529 Noninfective gastroenteritis and colitis, unspecified: Secondary | ICD-10-CM | POA: Insufficient documentation

## 2016-12-29 DIAGNOSIS — R112 Nausea with vomiting, unspecified: Secondary | ICD-10-CM | POA: Diagnosis present

## 2016-12-29 LAB — COMPREHENSIVE METABOLIC PANEL
ALT: 20 U/L (ref 17–63)
ANION GAP: 11 (ref 5–15)
AST: 28 U/L (ref 15–41)
Albumin: 4.4 g/dL (ref 3.5–5.0)
Alkaline Phosphatase: 58 U/L (ref 38–126)
BILIRUBIN TOTAL: 1.7 mg/dL — AB (ref 0.3–1.2)
BUN: 18 mg/dL (ref 6–20)
CHLORIDE: 102 mmol/L (ref 101–111)
CO2: 27 mmol/L (ref 22–32)
Calcium: 9.8 mg/dL (ref 8.9–10.3)
Creatinine, Ser: 1.54 mg/dL — ABNORMAL HIGH (ref 0.61–1.24)
GFR, EST NON AFRICAN AMERICAN: 55 mL/min — AB (ref >60)
Glucose, Bld: 114 mg/dL — ABNORMAL HIGH (ref 65–99)
POTASSIUM: 4.6 mmol/L (ref 3.5–5.1)
Sodium: 140 mmol/L (ref 135–145)
TOTAL PROTEIN: 8.7 g/dL — AB (ref 6.5–8.1)

## 2016-12-29 LAB — CBC
HEMATOCRIT: 54.4 % — AB (ref 40.0–52.0)
HEMOGLOBIN: 18.3 g/dL — AB (ref 13.0–18.0)
MCH: 30.3 pg (ref 26.0–34.0)
MCHC: 33.6 g/dL (ref 32.0–36.0)
MCV: 90.1 fL (ref 80.0–100.0)
Platelets: 228 10*3/uL (ref 150–440)
RBC: 6.04 MIL/uL — AB (ref 4.40–5.90)
RDW: 13 % (ref 11.5–14.5)
WBC: 8 10*3/uL (ref 3.8–10.6)

## 2016-12-29 LAB — LIPASE, BLOOD: LIPASE: 22 U/L (ref 11–51)

## 2016-12-29 MED ORDER — ONDANSETRON HCL 4 MG PO TABS: 4.0000 mg | ORAL_TABLET | Freq: Three times a day (TID) | ORAL | 0 refills | Status: DC | PRN

## 2016-12-29 MED ORDER — ONDANSETRON HCL 4 MG/2ML IJ SOLN
4.0000 mg | Freq: Once | INTRAMUSCULAR | Status: AC | PRN
  Administered 2016-12-29: 4 mg via INTRAVENOUS
  Filled 2016-12-29: qty 2

## 2016-12-29 MED ORDER — CLONAZEPAM 0.5 MG PO TABS
0.5000 mg | ORAL_TABLET | Freq: Once | ORAL | Status: AC
  Administered 2016-12-29: 0.5 mg via ORAL
  Filled 2016-12-29: qty 1

## 2016-12-29 MED ORDER — METOCLOPRAMIDE HCL 5 MG/ML IJ SOLN
10.0000 mg | Freq: Once | INTRAMUSCULAR | Status: AC
  Administered 2016-12-29: 10 mg via INTRAVENOUS
  Filled 2016-12-29: qty 2

## 2016-12-29 MED ORDER — SODIUM CHLORIDE 0.9 % IV BOLUS (SEPSIS)
1000.0000 mL | Freq: Once | INTRAVENOUS | Status: AC
  Administered 2016-12-29: 1000 mL via INTRAVENOUS

## 2016-12-29 MED ORDER — KETOROLAC TROMETHAMINE 30 MG/ML IJ SOLN
15.0000 mg | Freq: Once | INTRAMUSCULAR | Status: AC
  Administered 2016-12-29: 15 mg via INTRAVENOUS
  Filled 2016-12-29: qty 1

## 2016-12-29 NOTE — ED Triage Notes (Signed)
Says 2 days he has diarrhea and vomiting. Has seen his doctor and it has not gotten better.  Unable to keep down anythin.  Says stool is dark green watery.

## 2016-12-29 NOTE — ED Notes (Signed)
Pt ambulatory, in NAD at time of d/c. Pt verbalizes understanding of d/c teahcing

## 2016-12-29 NOTE — Discharge Instructions (Signed)

## 2016-12-29 NOTE — ED Provider Notes (Signed)
[facility] Emergency Department Provider Note  ____________________________________________  Time seen: Approximately 3:57 PM  I have reviewed the triage vital signs and the nursing notes.   HISTORY  Chief Complaint Abdominal Pain; Emesis; Diarrhea; and Fever   HPI S[place] is a 41 y.o. male with a history of gastritis, GERD, IBS, prostatitis and presents for evaluation of abdominal pain. Patient endorses 2 days of diffuse cramping moderate to severe abdominal pain associated with several daily episodes of nonbloody nonbilious emesis and watery diarrhea. No melena, no coffee ground emesis, no hematemesis, no hematochezia. Has had a fever as high as 101F the last 2 nights and chills. No dysuria or hematuria. No known sick contact exposures. Patient has had cholecystectomy and ex lap in the past for unknown abdominal pain.  Past Medical History:  Diagnosis Date  . Anxiety   . Degenerative disc disease   . Depression   . Enteritis   . Gastritis   . GERD (gastroesophageal reflux disease)   . Gilbert's syndrome    hyperbilirubinemia  . IBS (irritable bowel syndrome)   . Inguinal hernia   . Insomnia   . Prostatitis 2008    Patient Active Problem List   Diagnosis Date Noted  . Back pain 10/06/2016  . Urinary frequency 10/06/2016  . Routine general medical examination at a health care facility 06/08/2015  . Generalized anxiety disorder 11/01/2013  . Elevated LFTs 07/26/2013  . GERD (gastroesophageal reflux disease) 12/20/2012  . 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 GILBERT'S SYNDROME 09/28/2009  . IRRITABLE BOWEL SYNDROME 09/28/2009  . 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 ALLERGIC RHINITIS 08/30/2007    Past Surgical History:  Procedure Laterality Date  . BACK SURGERY     2 ruptured discs  . CHOLECYSTECTOMY  01/31/14  . ESOPHAGOGASTRODUODENOSCOPY  09/2004   gastritis acute and duodenitis  .  ESOPHAGOGASTRODUODENOSCOPY  01-14-14   Dr Allen Norris  . EXPLORATORY LAPAROTOMY  Jan 2015   Dr Burt Knack    Prior to Admission medications   Medication Sig Start Date End Date Taking? Authorizing Provider  buPROPion (WELLBUTRIN XL) 150 MG 24 hr tablet Take 1 tablet (150 mg total) by mouth daily. 10/24/16   Pleas Koch, NP  cyclobenzaprine (FLEXERIL) 10 MG tablet Take 1 tablet (10 mg total) by mouth 3 (three) times daily as needed for muscle spasms. 12/22/16   Pleas Koch, NP  gabapentin (NEURONTIN) 300 MG capsule TAKE 1 CAPSULE BY MOUTH THREE TIMES A DAY 12/08/16   [provider]  glycopyrrolate (ROBINUL) 1 MG tablet TAKE 1 TABLET (1 MG TOTAL) BY MOUTH 2 (TWO) TIMES DAILY AS NEEDED. 10/30/15   Jackolyn Confer, MD  hydrOXYzine (ATARAX/VISTARIL) 25 MG tablet Take 1-2 tablets (25-50 mg total) by mouth every 8 (eight) hours as needed for anxiety. 12/09/16   Pleas Koch, NP  ondansetron (ZOFRAN ODT) 4 MG disintegrating tablet Take 1 tablet (4 mg total) by mouth every 8 (eight) hours as needed for nausea or vomiting. 12/28/16   Pleas Koch, NP  ondansetron (ZOFRAN) 4 MG tablet Take 1 tablet (4 mg total) by mouth every 8 (eight) hours as needed for nausea or vomiting. 12/29/16   Alfred Levins, [place], MD  traZODone (DESYREL) 50 MG tablet Take 1 tablet (50 mg total) by mouth at bedtime as needed for sleep. 10/24/16   Pleas Koch, NP    Allergies Morphine and related; Morphine; and Tdap [diphth-acell pertussis-tetanus]  Family History  Problem Relation  Age of Onset  . Colon polyps Mother   . Cancer Mother        sinus cavity, on XRT  . Heart disease Father   . Heart attack Father        x 2  . Pancreatic cancer Maternal Grandfather     Social History Social History  Substance Use Topics  . Smoking status: Never Smoker  . Smokeless tobacco: Never Used  . Alcohol use No    Review of Systems  Constitutional: + fever. Eyes: Negative for visual changes. ENT: Negative  for sore throat. Neck: No neck pain  Cardiovascular: Negative for chest pain. Respiratory: Negative for shortness of breath. Gastrointestinal: + diffuse cramping abdominal pain, vomiting and diarrhea. Genitourinary: Negative for dysuria. Musculoskeletal: Negative for back pain. Skin: Negative for rash. Neurological: Negative for headaches, weakness or numbness. Psych: No SI or HI  ____________________________________________   PHYSICAL EXAM:  VITAL SIGNS: ED Triage Vitals  Enc Vitals Group     BP --      Pulse Rate 12/29/16 1244 (!) 115     Resp 12/29/16 1244 18     Temp 12/29/16 1244 97.8 F (36.6 C)     Temp Source 12/29/16 1244 Oral     SpO2 12/29/16 1244 96 %     Weight 12/29/16 1244 176 lb (79.8 kg)     Height 12/29/16 1244 6\' 2"  (1.88 m)     Head Circumference --      Peak Flow --      Pain Score 12/29/16 1243 5     Pain Loc --      Pain Edu? --      Excl. in [place]? --     Constitutional: Alert and oriented. Well appearing and in no apparent distress. HEENT:      Head: Normocephalic and atraumatic.         Eyes: Conjunctivae are normal. Sclera is non-icteric.       Mouth/Throat: Mucous membranes are dry.       Neck: Supple with no signs of meningismus. Cardiovascular: Tachycardic with regular rhythm. No murmurs, gallops, or rubs. 2+ symmetrical distal pulses are present in all extremities. No JVD. Respiratory: Normal respiratory effort. Lungs are clear to auscultation bilaterally. No wheezes, crackles, or rhonchi.  Gastrointestinal: Soft, diffuse tenderness throughout, non distended with positive bowel sounds. No rebound or guarding. Musculoskeletal: Nontender with normal range of motion in all extremities. No edema, cyanosis, or erythema of extremities. Neurologic: Normal speech and language. Face is symmetric. Moving all extremities. No gross focal neurologic deficits are appreciated. Skin: Skin is warm, dry and intact. No rash noted. Psychiatric: Mood and affect  are normal. Speech and behavior are normal.  ____________________________________________   LABS (all labs ordered are listed, but only abnormal results are displayed)  Labs Reviewed  COMPREHENSIVE METABOLIC PANEL - Abnormal; Notable for the following:       Result Value   Glucose, Bld 114 (*)    Creatinine, Ser 1.54 (*)    Total Protein 8.7 (*)    Total Bilirubin 1.7 (*)    GFR calc non Af Amer 55 (*)    All other components within normal limits  CBC - Abnormal; Notable for the following:    RBC 6.04 (*)    Hemoglobin 18.3 (*)    HCT 54.4 (*)    All other components within normal limits  LIPASE, BLOOD  URINALYSIS, COMPLETE (UACMP) WITH MICROSCOPIC   ____________________________________________  EKG  none  ____________________________________________  RADIOLOGY  none  ____________________________________________   PROCEDURES  Procedure(s) performed: None Procedures Critical Care performed:  None ____________________________________________   INITIAL IMPRESSION / ASSESSMENT AND PLAN / ED COURSE   40 y.o. male with a history of gastritis, GERD, IBS, prostatitis and presents for evaluation of diffuse cramping abdominal pain, nausea, vomiting, diarrhea, fever x 2 days. Patient looks dry on exam, tachycardic with pulse of 115, afebrile. Abdomen is soft and diffuse tenderness throughout.Labs showing AKI and hemoconcentration but no leukocytosis, normal LFTs. At this time most likely etiology would be a viral gastroenteritis however patient is tender in the RLQ as well as the rest of his abdomen. Will do serial abdominal exams, treat with toradol, zofran, fluids and reassess.   ----------------------------------------- 3:55 PM on 12/29/2016 -----------------------------------------   OBSERVATION CARE: This patient is being placed under observation care for the following reasons: Abdominal pain patients requiring serial exams to determine response to  treatment   Clinical Course as of Dec 29 1753  Thu Dec 29, 2016  1616 Repeat abdominal exam showing persistent tenderness diffusely including the right lower quadrant. I offered a CT scan to rule out appendicitis however patient just spoke with his partner who also is out of work with similar symptoms therefore patient believes this is a virus which I do believe is the most likely cause of his symptoms at this time as well. Therefore we decided to hold off on the CAT scan at this time. We'll continue to monitor patient with serial abdominal exams.  [CV]  2778 Patient remains with mild diffuse ttp over the lower quadrants, will f/u in 12 hours for recheck or sooner here if pain migrates to the RLQ, if fever returns, if unable to keep liquids down.   [CV]    Clinical Course User Index [CV] Alfred Levins [place], MD    ----------------------------------------- 5:55 PM on 12/29/2016 -----------------------------------------   END OF OBSERVATION STATUS: After an appropriate period of observation, this patient is being discharged    Pertinent labs & imaging results that were available during my care of the patient were reviewed by me and considered in my medical decision making (see chart for details).    ____________________________________________   FINAL CLINICAL IMPRESSION(S) / ED DIAGNOSES  Final diagnoses:  Nausea vomiting and diarrhea  Gastroenteritis      NEW MEDICATIONS STARTED DURING THIS VISIT:  New Prescriptions   ONDANSETRON (ZOFRAN) 4 MG TABLET    Take 1 tablet (4 mg total) by mouth every 8 (eight) hours as needed for nausea or vomiting.     Note:  This document was prepared using Dragon voice recognition software and may include unintentional dictation errors.    Alfred Levins, [place], MD 12/29/16 [phone]

## 2017-01-02 ENCOUNTER — Encounter: Payer: Self-pay | Admitting: Primary Care

## 2017-01-02 DIAGNOSIS — R197 Diarrhea, unspecified: Secondary | ICD-10-CM

## 2017-01-04 ENCOUNTER — Other Ambulatory Visit (INDEPENDENT_AMBULATORY_CARE_PROVIDER_SITE_OTHER): Payer: Managed Care, Other (non HMO)

## 2017-01-04 DIAGNOSIS — R197 Diarrhea, unspecified: Secondary | ICD-10-CM

## 2017-01-04 LAB — CBC WITH DIFFERENTIAL/PLATELET
BASOS ABS: 0 10*3/uL (ref 0.0–0.1)
Basophils Relative: 0.4 % (ref 0.0–3.0)
EOS ABS: 0.1 10*3/uL (ref 0.0–0.7)
Eosinophils Relative: 1.7 % (ref 0.0–5.0)
HCT: 46.8 % (ref 39.0–52.0)
HEMOGLOBIN: 15.5 g/dL (ref 13.0–17.0)
LYMPHS PCT: 23.8 % (ref 12.0–46.0)
Lymphs Abs: 2.1 10*3/uL (ref 0.7–4.0)
MCHC: 33.1 g/dL (ref 30.0–36.0)
MCV: 92.4 fl (ref 78.0–100.0)
Monocytes Absolute: 1.2 10*3/uL — ABNORMAL HIGH (ref 0.1–1.0)
Monocytes Relative: 13.5 % — ABNORMAL HIGH (ref 3.0–12.0)
Neutro Abs: 5.2 10*3/uL (ref 1.4–7.7)
Neutrophils Relative %: 60.6 % (ref 43.0–77.0)
PLATELETS: 356 10*3/uL (ref 150.0–400.0)
RBC: 5.06 Mil/uL (ref 4.22–5.81)
RDW: 12.9 % (ref 11.5–15.5)
WBC: 8.6 10*3/uL (ref 4.0–10.5)

## 2017-01-04 LAB — COMPREHENSIVE METABOLIC PANEL
ALT: 16 U/L (ref 0–53)
AST: 12 U/L (ref 0–37)
Albumin: 4.1 g/dL (ref 3.5–5.2)
Alkaline Phosphatase: 56 U/L (ref 39–117)
BUN: 10 mg/dL (ref 6–23)
CHLORIDE: 104 meq/L (ref 96–112)
CO2: 32 meq/L (ref 19–32)
CREATININE: 0.92 mg/dL (ref 0.40–1.50)
Calcium: 9.5 mg/dL (ref 8.4–10.5)
GFR: 97.33 mL/min (ref >60.00)
GLUCOSE: 97 mg/dL (ref 70–99)
Potassium: 4.6 mEq/L (ref 3.5–5.1)
SODIUM: 140 meq/L (ref 135–145)
Total Bilirubin: 1 mg/dL (ref 0.2–1.2)
Total Protein: 6.7 g/dL (ref 6.0–8.3)

## 2017-01-11 ENCOUNTER — Telehealth: Payer: Self-pay | Admitting: Urgent Care

## 2017-01-11 NOTE — Telephone Encounter (Signed)
I sent a my chart message to the patient asking him to come to the office and sign his new card with the corrected information on it.

## 2017-01-18 ENCOUNTER — Other Ambulatory Visit: Payer: Self-pay | Admitting: Primary Care

## 2017-01-18 DIAGNOSIS — G4726 Circadian rhythm sleep disorder, shift work type: Secondary | ICD-10-CM

## 2017-01-18 DIAGNOSIS — F411 Generalized anxiety disorder: Secondary | ICD-10-CM

## 2017-01-18 NOTE — Telephone Encounter (Signed)
Ok to refill? Electronically refill request for   trazodone (DESYREL) 50 MG tablet - Last prescribed on 10/24/2016  hydrOXYzine (ATARAX/VISTARIL) 25 MG tablet - Last prescribed on 12/09/2016  Last seen on 12/28/2016

## 2017-01-18 NOTE — Telephone Encounter (Signed)
Noted, refill sent to pharmacy. 

## 2017-02-09 ENCOUNTER — Encounter: Payer: Self-pay | Admitting: Primary Care

## 2017-03-03 ENCOUNTER — Emergency Department
Admission: EM | Admit: 2017-03-03 | Discharge: 2017-03-04 | Disposition: A | Discharge status: Home / Self Care | Payer: Managed Care, Other (non HMO) | Attending: Emergency Medicine | Admitting: Emergency Medicine

## 2017-03-03 ENCOUNTER — Emergency Department: Payer: Managed Care, Other (non HMO)

## 2017-03-03 DIAGNOSIS — R519 Headache, unspecified: Secondary | ICD-10-CM

## 2017-03-03 DIAGNOSIS — H538 Other visual disturbances: Secondary | ICD-10-CM | POA: Diagnosis not present

## 2017-03-03 DIAGNOSIS — R112 Nausea with vomiting, unspecified: Secondary | ICD-10-CM | POA: Diagnosis not present

## 2017-03-03 DIAGNOSIS — Z79899 Other long term (current) drug therapy: Secondary | ICD-10-CM | POA: Diagnosis not present

## 2017-03-03 DIAGNOSIS — R51 Headache: Secondary | ICD-10-CM | POA: Insufficient documentation

## 2017-03-03 LAB — URINALYSIS, COMPLETE (UACMP) WITH MICROSCOPIC
BILIRUBIN URINE: NEGATIVE
Bacteria, UA: NONE SEEN
GLUCOSE, UA: NEGATIVE mg/dL
HGB URINE DIPSTICK: NEGATIVE
KETONES UR: NEGATIVE mg/dL
LEUKOCYTES UA: NEGATIVE
NITRITE: NEGATIVE
PH: 7 (ref 5.0–8.0)
Protein, ur: NEGATIVE mg/dL
RBC / HPF: NONE SEEN RBC/hpf (ref 0–5)
Specific Gravity, Urine: 1.001 — ABNORMAL LOW (ref 1.005–1.030)
Squamous Epithelial / LPF: NONE SEEN
WBC, UA: NONE SEEN WBC/hpf (ref 0–5)

## 2017-03-03 LAB — CBC
HCT: 46.3 % (ref 40.0–52.0)
HEMOGLOBIN: 15.7 g/dL (ref 13.0–18.0)
MCH: 30 pg (ref 26.0–34.0)
MCHC: 34 g/dL (ref 32.0–36.0)
MCV: 88.2 fL (ref 80.0–100.0)
Platelets: 278 10*3/uL (ref 150–440)
RBC: 5.25 MIL/uL (ref 4.40–5.90)
RDW: 13.2 % (ref 11.5–14.5)
WBC: 6.9 10*3/uL (ref 3.8–10.6)

## 2017-03-03 LAB — BASIC METABOLIC PANEL
ANION GAP: 9 (ref 5–15)
BUN: 15 mg/dL (ref 6–20)
CALCIUM: 9.5 mg/dL (ref 8.9–10.3)
CO2: 27 mmol/L (ref 22–32)
CREATININE: 1.11 mg/dL (ref 0.61–1.24)
Chloride: 103 mmol/L (ref 101–111)
Glucose, Bld: 139 mg/dL — ABNORMAL HIGH (ref 65–99)
Potassium: 3.4 mmol/L — ABNORMAL LOW (ref 3.5–5.1)
SODIUM: 139 mmol/L (ref 135–145)

## 2017-03-03 LAB — GLUCOSE, CAPILLARY: Glucose-Capillary: 136 mg/dL — ABNORMAL HIGH (ref 65–99)

## 2017-03-03 MED ORDER — ONDANSETRON HCL 4 MG/2ML IJ SOLN
4.0000 mg | Freq: Once | INTRAMUSCULAR | Status: AC
  Administered 2017-03-03: 4 mg via INTRAVENOUS
  Filled 2017-03-03: qty 2

## 2017-03-03 MED ORDER — IOPAMIDOL (ISOVUE-370) INJECTION 76%
75.0000 mL | Freq: Once | INTRAVENOUS | Status: AC | PRN
  Administered 2017-03-03: 75 mL via INTRAVENOUS

## 2017-03-03 MED ORDER — FENTANYL CITRATE (PF) 100 MCG/2ML IJ SOLN
50.0000 ug | Freq: Once | INTRAMUSCULAR | Status: AC
  Administered 2017-03-03: 50 ug via INTRAVENOUS
  Filled 2017-03-03: qty 2

## 2017-03-03 NOTE — ED Notes (Signed)
Pt returned from CT via stretcher.

## 2017-03-03 NOTE — ED Notes (Signed)
Pt states he has had a severe left-sided headache for about 1 week. Today the pain increased and caused him to start vomiting. Pt reports emesis x4 today. Pt eyes appear red at this time. Pt denies any sensitivity to light.

## 2017-03-03 NOTE — ED Triage Notes (Addendum)
Pt c/o sharp and stabbing intermittent headache for last week. Pt also becomes pale, diaphoretic, blurry vision when episodes occur. Denies CP. Reports sob when episodes occur. Last approx 10-15 minutes. No recent illness. Pt is pale at time of triage, mentation appropriate. Reports that first occurrence when he was having sexual intercourse, has happened multiple times since when having sexual intercourse.   Pupils dilated.  Pt denies drug or alcohol use.

## 2017-03-03 NOTE — ED Notes (Signed)
Pt taken to CT via stretcher.

## 2017-03-03 NOTE — ED Provider Notes (Signed)
[facility] Emergency Department Provider Note _______   First MD Initiated Contact with Patient 03/03/17 2217     (approximate)  I have reviewed the triage vital signs and the nursing notes.   HISTORY  Chief Complaint Headache and Blurred Vision    HPI Sergio Garcia is a 41 y.o. male presents with history of acute onset of headache which occurred post choroidal last Friday. Patient admits to persistent intermittent headaches since that time. Patient states that the pain was generalized head and at maximum intensity was 10 out of 10. Patient also admits to blurred vision nausea and vomiting today. Patient also admits to dizziness. Patient denies any weakness or numbness  Past Medical History:  Diagnosis Date  . Anxiety   . Degenerative disc disease   . Depression   . Enteritis   . Gastritis   . GERD (gastroesophageal reflux disease)   . Gilbert's syndrome    hyperbilirubinemia  . IBS (irritable bowel syndrome)   . Inguinal hernia   . Insomnia   . Prostatitis 2008    Patient Active Problem List   Diagnosis Date Noted  . Back pain 10/06/2016  . Urinary frequency 10/06/2016  . Routine general medical examination at a health care facility 06/08/2015  . Generalized anxiety disorder 11/01/2013  . Elevated LFTs 07/26/2013  . GERD (gastroesophageal reflux disease) 12/20/2012  . 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 GILBERT'S SYNDROME 09/28/2009  . IRRITABLE BOWEL SYNDROME 09/28/2009  . 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 ALLERGIC RHINITIS 08/30/2007    Past Surgical History:  Procedure Laterality Date  . BACK SURGERY     2 ruptured discs  . CHOLECYSTECTOMY  01/31/14  . ESOPHAGOGASTRODUODENOSCOPY  09/2004   gastritis acute and duodenitis  . ESOPHAGOGASTRODUODENOSCOPY  01-14-14   Dr Allen Norris  . EXPLORATORY LAPAROTOMY  Jan 2015   Dr Burt Knack    Prior to Admission medications   Medication Sig Start Date End Date  Taking? Authorizing Provider  buPROPion (WELLBUTRIN XL) 150 MG 24 hr tablet Take 1 tablet (150 mg total) by mouth daily. 10/24/16   Pleas Koch, NP  cyclobenzaprine (FLEXERIL) 10 MG tablet Take 1 tablet (10 mg total) by mouth 3 (three) times daily as needed for muscle spasms. 12/22/16   Pleas Koch, NP  gabapentin (NEURONTIN) 300 MG capsule TAKE 1 CAPSULE BY MOUTH THREE TIMES A DAY 12/08/16   [provider]  glycopyrrolate (ROBINUL) 1 MG tablet TAKE 1 TABLET (1 MG TOTAL) BY MOUTH 2 (TWO) TIMES DAILY AS NEEDED. 10/30/15   Jackolyn Confer, MD  hydrOXYzine (ATARAX/VISTARIL) 25 MG tablet TAKE 1-2 TABLETS (25-50 MG TOTAL) BY MOUTH EVERY 8 (EIGHT) HOURS AS NEEDED FOR ANXIETY. 01/18/17   Pleas Koch, NP  ondansetron (ZOFRAN ODT) 4 MG disintegrating tablet Take 1 tablet (4 mg total) by mouth every 8 (eight) hours as needed for nausea or vomiting. 12/28/16   Pleas Koch, NP  ondansetron (ZOFRAN) 4 MG tablet Take 1 tablet (4 mg total) by mouth every 8 (eight) hours as needed for nausea or vomiting. 12/29/16   Alfred Levins, [place], MD  traZODone (DESYREL) 50 MG tablet TAKE 1 TABLET (50 MG TOTAL) BY MOUTH AT BEDTIME AS NEEDED FOR SLEEP. 01/18/17   Pleas Koch, NP    Allergies Morphine and related; Morphine; and Tdap [diphth-acell pertussis-tetanus]  Family History  Problem Relation Age of Onset  . Colon polyps Mother   . Cancer Mother  sinus cavity, on XRT  . Heart disease Father   . Heart attack Father        x 2  . Pancreatic cancer Maternal Grandfather     Social History Social History  Substance Use Topics  . Smoking status: Never Smoker  . Smokeless tobacco: Never Used  . Alcohol use No    Review of Systems Constitutional: No fever/chills Eyes: No visual changes. ENT: No sore throat. Cardiovascular: Denies chest pain. Respiratory: Denies shortness of breath. Gastrointestinal: No abdominal pain.  Positive for nausea, no vomiting.  No diarrhea.  No  constipation. Genitourinary: Negative for dysuria. Musculoskeletal: Negative for neck pain.  Negative for back pain. Integumentary: Negative for rash. Neurological: Positive for headaches, dizziness and blurred vision.  ____________________________________________   PHYSICAL EXAM:  VITAL SIGNS: ED Triage Vitals  Enc Vitals Group     BP 03/03/17 1756 (!) 119/94     Pulse Rate 03/03/17 1756 88     Resp 03/03/17 1756 18     Temp 03/03/17 1756 98.8 F (37.1 C)     Temp Source 03/03/17 1756 Oral     SpO2 03/03/17 1756 99 %     Weight 03/03/17 1756 83.9 kg (185 lb)     Height 03/03/17 1756 1.88 m (6\' 2" )     Head Circumference --      Peak Flow --      Pain Score 03/03/17 1755 10     Pain Loc --      Pain Edu? --      Excl. in [place]? --     Constitutional: Alert and oriented. Well appearing and in no acute distress. Eyes: Conjunctivae are normal. PERRL. EOMI. Head: Atraumatic. Mouth/Throat: Mucous membranes are moist.  Oropharynx non-erythematous. Neck: No stridor.  Cardiovascular: Normal rate, regular rhythm. Good peripheral circulation. Grossly normal heart sounds. Respiratory: Normal respiratory effort.  No retractions. Lungs CTAB. Gastrointestinal: Soft and nontender. No distention.  Musculoskeletal: No lower extremity tenderness nor edema. No gross deformities of extremities. Neurologic:  Normal speech and language. No gross focal neurologic deficits are appreciated.  Skin:  Skin is warm, dry and intact. No rash noted. Psychiatric: Mood and affect are normal. Speech and behavior are normal.  ____________________________________________   LABS (all labs ordered are listed, but only abnormal results are displayed)  Labs Reviewed  BASIC METABOLIC PANEL - Abnormal; Notable for the following:       Result Value   Potassium 3.4 (*)    Glucose, Bld 139 (*)    All other components within normal limits  URINALYSIS, COMPLETE (UACMP) WITH MICROSCOPIC - Abnormal; Notable for  the following:    Color, Urine COLORLESS (*)    APPearance CLEAR (*)    Specific Gravity, Urine 1.001 (*)    All other components within normal limits  GLUCOSE, CAPILLARY - Abnormal; Notable for the following:    Glucose-Capillary 136 (*)    All other components within normal limits  CBC  CBG MONITORING, ED   ____________________________________________  EKG  ED ECG REPORT I, [place] N Nicolina Hirt, the attending physician, personally viewed and interpreted this ECG.   Date: 03/04/2017  EKG Time: 6:02 PM  Rate: 89  Rhythm: Normal sinus rhythm  Axis: Normal  Intervals: Normal  ST&T Change: None  ____________________________________________  RADIOLOGY I, [place] N Islam Villes[place], personally viewed and evaluated these images (plain radiographs) as part of my medical decision making, as well as reviewing the written report by the radiologist.  Ct Head Wo Contrast  Result Date: 03/03/2017 CLINICAL DATA:  Sharp intermittent headache for 1 week. Visual changes during headaches. EXAM: CT HEAD WITHOUT CONTRAST TECHNIQUE: Contiguous axial images were obtained from the base of the skull through the vertex without intravenous contrast. COMPARISON:  None. FINDINGS: Brain: There is no intracranial hemorrhage, mass or evidence of acute infarction. There is no extra-axial fluid collection. Gray matter and white matter appear normal. Cerebral volume is normal for age. Brainstem and posterior fossa are unremarkable. The CSF spaces appear normal. Vascular: No hyperdense vessel or unexpected calcification. Skull: Normal. Negative for fracture or focal lesion. Sinuses/Orbits: No acute finding. Other: None. IMPRESSION: Normal brain Electronically Signed   By: Andreas Newport M.D.   On: 03/03/2017 18:28     Procedures   ____________________________________________   INITIAL IMPRESSION / ASSESSMENT AND PLAN / ED COURSE  As part of my medical decision making, I reviewed the following data within the  electronic MEDICAL RECORD NUMBER6 year old male presenting with above stated history of postcoital headache. Consider the possibility of subarachnoid hemorrhage versus aneurysm and a such CT angiogram of the head and neck were performed which revealed no acute abnormality, no aneurysms or any other vascular abnormality. Considered possibility of tension, migraine or cluster headache. Patient headache free at this time. Spoke with the patient and family at length and informed of all clinical findings. Patient recommended to follow up with primary care provider.    ____________________________________________  FINAL CLINICAL IMPRESSION(S) / ED DIAGNOSES  Final diagnoses:  Acute nonintractable headache, unspecified headache type     MEDICATIONS GIVEN DURING THIS VISIT:  Medications - No data to display   NEW OUTPATIENT MEDICATIONS STARTED DURING THIS VISIT:  New Prescriptions   No medications on file    Modified Medications   No medications on file    Discontinued Medications   No medications on file     Note:  This document was prepared using Dragon voice recognition software and may include unintentional dictation errors.    Gregor Hams, MD 03/04/17 [phone]

## 2017-04-25 ENCOUNTER — Encounter: Payer: Self-pay | Admitting: Physician Assistant

## 2017-04-25 ENCOUNTER — Ambulatory Visit: Payer: Self-pay | Admitting: Physician Assistant

## 2017-04-25 VITALS — BP 144/67 | HR 91 | Temp 98.5°F | Resp 16

## 2017-04-25 DIAGNOSIS — S93401A Sprain of unspecified ligament of right ankle, initial encounter: Secondary | ICD-10-CM

## 2017-04-25 NOTE — Progress Notes (Signed)
   Subjective: Right ankle pain     Patient ID: Sergio Garcia, male    DOB: 1976/03/01, 41 y.o.   MRN: [id number]  HPI Patient complaining of pain to the lateral aspect of right ankle secondary to this incident one week ago. Patient able to ankle stepping off a curb. Patient state cell treatment consisting of a elastic support and anti-inflammatory medication.    Review of Systems Negative except for complaint    Objective:   Physical Exam: No obvious deformity to the right ankle. No obvious edema erythema, or ecchymosis. Patient has full nuchal range of motion of the ankle. Patient is moderate guarding palpation inferior aspect of the lateral malleolus ecchymosis.        Assessment & Plan: Right ankle sprain   Patient given discharge care instructions. Patient given ankle stirrup splint and advise continue anti-inflammatory medications. Follow-up as needed.

## 2017-05-31 ENCOUNTER — Telehealth: Payer: Self-pay

## 2017-05-31 NOTE — Telephone Encounter (Signed)
I would be willing to accept the transfer but am concerned about his expectations since a review of his encounters with Anda Kraft show that she has been very responsive to his needs

## 2017-05-31 NOTE — Telephone Encounter (Signed)
I've attempted to help patient to the best of my ability, with his wellbeing in mind, during each visit, I'm sorry to hear he's dissatisfied.

## 2017-05-31 NOTE — Telephone Encounter (Signed)
Copied from [place] [phone]. Topic: General - Other >> May 31, 2017  9:27 AM Yvette Rack wrote: Reason for CRM: patient calling stating that he would like to switch providers from [facility] to [place] stating that every time he goes to see Carlis Abbott he never get anything accomplished

## 2017-06-01 NOTE — Telephone Encounter (Signed)
I left a detailed msg for pt per DPR to call and set up appt to transfer care to Dr Silvio Pate.

## 2017-06-01 NOTE — Telephone Encounter (Signed)
okay

## 2017-06-01 NOTE — Telephone Encounter (Signed)
We will call him and get his scheduled to establish with Dr. Silvio Pate

## 2017-09-01 ENCOUNTER — Other Ambulatory Visit: Payer: Self-pay

## 2017-09-01 ENCOUNTER — Ambulatory Visit
Admission: EM | Admit: 2017-09-01 | Discharge: 2017-09-01 | Disposition: A | Discharge status: Home / Self Care | Payer: Managed Care, Other (non HMO) | Attending: Family Medicine | Admitting: Family Medicine

## 2017-09-01 ENCOUNTER — Encounter: Payer: Self-pay | Admitting: Emergency Medicine

## 2017-09-01 DIAGNOSIS — J029 Acute pharyngitis, unspecified: Secondary | ICD-10-CM | POA: Diagnosis not present

## 2017-09-01 LAB — RAPID STREP SCREEN (MED CTR MEBANE ONLY): Streptococcus, Group A Screen (Direct): NEGATIVE

## 2017-09-01 MED ORDER — AZITHROMYCIN 250 MG PO TABS: ORAL_TABLET | ORAL | 0 refills | Status: DC

## 2017-09-01 NOTE — ED Triage Notes (Signed)
Patient in today c/o sore throat since this morning. Patient's girlfriend was seen last week (rapid strep and culture were negative). Patient denies fever, chills. He states he has felt hot. Patient is having body aches and slight cough as well.

## 2017-09-01 NOTE — Discharge Instructions (Addendum)
Take medication as prescribed. Rest. Drink plenty of fluids.  ° °Follow up with your primary care physician this week as needed. Return to Urgent care for new or worsening concerns.  ° °
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
## 2017-09-04 LAB — CULTURE, GROUP A STREP (THRC)

## 2018-01-18 ENCOUNTER — Telehealth: Payer: Self-pay | Admitting: Primary Care

## 2018-01-18 NOTE — Telephone Encounter (Signed)
Ok if ok with PCP   [place]

## 2018-01-18 NOTE — Telephone Encounter (Signed)
Copied from [place] [phone]. Topic: General - Other >> Jan 18, 2018  1:06 PM Cecelia Byars, NT wrote: Reason for CRM: Patient would like to do a TOC  from [facility],  to Dr Aundra Dubin Jacklynn Lewis at  [facility] please call him at  [phone] once this is completed to schedule

## 2018-01-18 NOTE — Telephone Encounter (Signed)
Fine with me, thanks. 

## 2018-01-19 NOTE — Telephone Encounter (Signed)
The patient is aware that he has been scheduled with Dr. Aundra Dubin.

## 2018-01-24 ENCOUNTER — Encounter: Payer: Self-pay | Admitting: Internal Medicine

## 2018-01-24 ENCOUNTER — Ambulatory Visit: Payer: BC Managed Care – PPO | Admitting: Internal Medicine

## 2018-01-24 VITALS — BP 120/70 | HR 62 | Temp 98.5°F | Ht 73.0 in | Wt 203.4 lb

## 2018-01-24 DIAGNOSIS — F419 Anxiety disorder, unspecified: Secondary | ICD-10-CM | POA: Diagnosis not present

## 2018-01-24 DIAGNOSIS — F329 Major depressive disorder, single episode, unspecified: Secondary | ICD-10-CM

## 2018-01-24 DIAGNOSIS — K589 Irritable bowel syndrome without diarrhea: Secondary | ICD-10-CM | POA: Diagnosis not present

## 2018-01-24 DIAGNOSIS — Z0184 Encounter for antibody response examination: Secondary | ICD-10-CM

## 2018-01-24 DIAGNOSIS — Z1159 Encounter for screening for other viral diseases: Secondary | ICD-10-CM

## 2018-01-24 DIAGNOSIS — Z1389 Encounter for screening for other disorder: Secondary | ICD-10-CM

## 2018-01-24 DIAGNOSIS — Z23 Encounter for immunization: Secondary | ICD-10-CM | POA: Diagnosis not present

## 2018-01-24 DIAGNOSIS — R739 Hyperglycemia, unspecified: Secondary | ICD-10-CM

## 2018-01-24 DIAGNOSIS — F32A Depression, unspecified: Secondary | ICD-10-CM

## 2018-01-24 DIAGNOSIS — Z1329 Encounter for screening for other suspected endocrine disorder: Secondary | ICD-10-CM

## 2018-01-24 DIAGNOSIS — Z13220 Encounter for screening for lipoid disorders: Secondary | ICD-10-CM

## 2018-01-24 DIAGNOSIS — Z0000 Encounter for general adult medical examination without abnormal findings: Secondary | ICD-10-CM

## 2018-01-24 DIAGNOSIS — E559 Vitamin D deficiency, unspecified: Secondary | ICD-10-CM

## 2018-01-24 MED ORDER — BUPROPION HCL ER (XL) 150 MG PO TB24: 150.0000 mg | ORAL_TABLET | Freq: Every day | ORAL | 2 refills | Status: DC

## 2018-01-24 MED ORDER — CLONAZEPAM 0.5 MG PO TABS: 0.5000 mg | ORAL_TABLET | Freq: Every day | ORAL | 2 refills | Status: DC | PRN

## 2018-01-24 NOTE — Progress Notes (Signed)
Pre visit review using our clinic review tool, if applicable. No additional management support is needed unless otherwise documented below in the visit note. 

## 2018-01-24 NOTE — Progress Notes (Signed)
Chief Complaint  Patient presents with  . Establish Care   F/u  1. Anxiety depression PHQ 9 score 19 and GAD 7 score 14 was on Wellbutrin 300 XL and Klonopin 0.5 bid prn tried Effexor and zoloft in the past and had sexual side effects. 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  2. H/o IBS diarrhea on imodium 2 mg prn   Review of Systems  Constitutional: Negative for weight loss.  HENT: Negative for hearing loss.   Eyes: Negative for blurred vision.  Respiratory: Negative for shortness of breath.   Cardiovascular: Negative for chest pain.  Gastrointestinal: Negative for abdominal pain.  Skin: Negative for rash.  Psychiatric/Behavioral: Positive for depression. The patient is nervous/anxious.        +PTSD   Past Medical History:  Diagnosis Date  . Anxiety   . Degenerative disc disease   . Depression   . Enteritis   . Gastritis   . GERD (gastroesophageal reflux disease)   . Gilbert's syndrome    hyperbilirubinemia  . IBS (irritable bowel syndrome)   . Inguinal hernia   . Insomnia   . Prostatitis 2008   Past Surgical History:  Procedure Laterality Date  . BACK SURGERY     2 ruptured discs  . CHOLECYSTECTOMY  01/31/14  . ESOPHAGOGASTRODUODENOSCOPY  09/2004   gastritis acute and duodenitis  . ESOPHAGOGASTRODUODENOSCOPY  01-14-14   Dr Allen Norris  . EXPLORATORY LAPAROTOMY  Jan 2015   Dr Burt Knack   Family History  Problem Relation Age of Onset  . Colon polyps Mother   . Cancer Mother        sinus cavity, on XRT  . Heart disease Father   . Heart attack Father        x 2  . Pancreatic cancer Maternal Grandfather    Social History   Socioeconomic History  . Marital status: Married    Spouse name: Not on file  . Number of children: 2  . Years of education: Not on file  . Highest education level: Not on file  Occupational History  . Occupation: Music therapist: [employer]  . Financial resource strain: Not on file  . Food insecurity:    Worry: Not on file    Inability: Not on file  . Transportation needs:    Medical: Not on file    Non-medical: Not on file  Tobacco Use  . Smoking status: Never Smoker  . Smokeless tobacco: Never Used  Substance and Sexual Activity  . Alcohol use: No  . Drug use: No  . Sexual activity: Not on file  Lifestyle  . Physical activity:    Days per week: Not on file    Minutes per session: Not on file  . Stress: Not on file  Relationships  . Social connections:    Talks on phone: Not on file    Gets together: Not on file    Attends religious service: Not on file    Active member of club or organization: Not on file    Attends meetings of clubs or organizations: Not on file    Relationship status: Not on file  . Intimate partner violence:    Fear of current or ex partner: Not on file    Emotionally abused: Not on file    Physically abused: Not on file  Forced sexual activity: Not on file  Other Topics Concern  . Not on file  Social History Narrative   Married.   Lives in [place].   Recently accepted to [employer].   2 children.   Enjoys watching racing, Dealer, deer hunting.             No outpatient medications have been marked as taking for the 01/24/18 encounter (Office Visit) with [name], [name], MD.   Allergies  Allergen Reactions  . Morphine And Related Anaphylaxis  . Morphine Nausea And Vomiting  . Tdap [Tetanus-Diphth-Acell Pertussis] Hives and Rash   No results found for this or any previous visit (from the past 2160 hour(s)). Objective  Body mass index is 26.84 kg/m. Wt Readings from Last 3 Encounters:  01/24/18 203 lb 6.4 oz (92.3 kg)  09/01/17 190 lb (86.2 kg)  03/03/17 185 lb (83.9 kg)   Temp Readings from Last 3 Encounters:  01/24/18 98.5 F (36.9 C) (Oral)  09/01/17 98.2 F (36.8 C) (Oral)  04/25/17 98.5 F (36.9 C) (Oral)   BP Readings  from Last 3 Encounters:  01/24/18 120/70  09/01/17 109/83  04/25/17 (!) 144/67   Pulse Readings from Last 3 Encounters:  01/24/18 62  09/01/17 81  04/25/17 91    Physical Exam  Constitutional: He is oriented to person, place, and time. Vital signs are normal. He appears well-developed and well-nourished. He is cooperative.  HENT:  Head: Normocephalic and atraumatic.  Mouth/Throat: Oropharynx is clear and moist and mucous membranes are normal.  Eyes: Pupils are equal, round, and reactive to light. Conjunctivae are normal.  Cardiovascular: Normal rate, regular rhythm and normal heart sounds.  Pulmonary/Chest: Effort normal and breath sounds normal.  Neurological: He is alert and oriented to person, place, and time. Gait normal.  Skin: Skin is warm, dry and intact.  Psychiatric: He has a normal mood and affect. His speech is normal and behavior is normal. Judgment and thought content normal. Cognition and memory are normal.  Nursing note and vitals reviewed.   Assessment   1.anxiety/depression c/w ptsd scores 14 and 19 respectively  2. ibs d s/p GB removal  3. HM Plan   1. Start wellbutrin xl 150 mg qd and klonopin 0.5 qd prn  2. Consider chlostyramine in the future if imodium does not help  3.  Flu shot today  utd Tdap had 06/20/11 and 12/2017 [facility]  Completed hep B 3/3 vxs 12/2017 [facility]  Fasting labs sch  Dermatology saw [facility] 2019 left scalp bx negative  Of note EGD 2015 normal  Provider: Dr. Olivia Mackie [name]-[facility]

## 2018-01-24 NOTE — Patient Instructions (Signed)
Schedule fasting labs before your next appt no food only water and medications x 12 hours  F/u in 6 weeks sooner if needed  Take care   Generalized Anxiety Disorder, Adult Generalized anxiety disorder (GAD) is a mental health disorder. People with this condition constantly worry about everyday events. Unlike normal anxiety, worry related to GAD is not triggered by a specific event. These worries also do not fade or get better with time. GAD interferes with life functions, including relationships, work, and school. GAD can vary from mild to severe. People with severe GAD can have intense waves of anxiety with physical symptoms (panic attacks). What are the causes? The exact cause of GAD is not known. What increases the risk? This condition is more likely to develop in:  Women.  People who have a family history of anxiety disorders.  People who are very shy.  People who experience very stressful life events, such as the death of a loved one.  People who have a very stressful family environment.  What are the signs or symptoms? People with GAD often worry excessively about many things in their lives, such as their health and family. They may also be overly concerned about:  Doing well at work.  Being on time.  Natural disasters.  Friendships.  Physical symptoms of GAD include:  Fatigue.  Muscle tension or having muscle twitches.  Trembling or feeling shaky.  Being easily startled.  Feeling like your heart is pounding or racing.  Feeling out of breath or like you cannot take a deep breath.  Having trouble falling asleep or staying asleep.  Sweating.  Nausea, diarrhea, or irritable bowel syndrome (IBS).  Headaches.  Trouble concentrating or remembering facts.  Restlessness.  Irritability.  How is this diagnosed? Your health care provider can diagnose GAD based on your symptoms and medical history. You will also have a physical exam. The health care provider  will ask specific questions about your symptoms, including how severe they are, when they started, and if they come and go. Your health care provider may ask you about your use of alcohol or drugs, including prescription medicines. Your health care provider may refer you to a mental health specialist for further evaluation. Your health care provider will do a thorough examination and may perform additional tests to rule out other possible causes of your symptoms. To be diagnosed with GAD, a person must have anxiety that:  Is out of his or her control.  Affects several different aspects of his or her life, such as work and relationships.  Causes distress that makes him or her unable to take part in normal activities.  Includes at least three physical symptoms of GAD, such as restlessness, fatigue, trouble concentrating, irritability, muscle tension, or sleep problems.  Before your health care provider can confirm a diagnosis of GAD, these symptoms must be present more days than they are not, and they must last for six months or longer. How is this treated? The following therapies are usually used to treat GAD:  Medicine. Antidepressant medicine is usually prescribed for long-term daily control. Antianxiety medicines may be added in severe cases, especially when panic attacks occur.  Talk therapy (psychotherapy). Certain types of talk therapy can be helpful in treating GAD by providing support, education, and guidance. Options include: ? Cognitive behavioral therapy (CBT). People learn coping skills and techniques to ease their anxiety. They learn to identify unrealistic or negative thoughts and behaviors and to replace them with positive ones. ? Acceptance  and commitment therapy (ACT). This treatment teaches people how to be mindful as a way to cope with unwanted thoughts and feelings. ? Biofeedback. This process trains you to manage your body's response (physiological response) through breathing  techniques and relaxation methods. You will work with a therapist while machines are used to monitor your physical symptoms.  Stress management techniques. These include yoga, meditation, and exercise.  A mental health specialist can help determine which treatment is best for you. Some people see improvement with one type of therapy. However, other people require a combination of therapies. Follow these instructions at home:  Take over-the-counter and prescription medicines only as told by your health care provider.  Try to maintain a normal routine.  Try to anticipate stressful situations and allow extra time to manage them.  Practice any stress management or self-calming techniques as taught by your health care provider.  Do not punish yourself for setbacks or for not making progress.  Try to recognize your accomplishments, even if they are small.  Keep all follow-up visits as told by your health care provider. This is important. Contact a health care provider if:  Your symptoms do not get better.  Your symptoms get worse.  You have signs of depression, such as: ? A persistently sad, cranky, or irritable mood. ? Loss of enjoyment in activities that used to bring you joy. ? Change in weight or eating. ? Changes in sleeping habits. ? Avoiding friends or family members. ? Loss of energy for normal tasks. ? Feelings of guilt or worthlessness. Get help right away if:  You have serious thoughts about hurting yourself or others. If you ever feel like you may hurt yourself or others, or have thoughts about taking your own life, get help right away. You can go to your nearest emergency department or call:  Your local emergency services (911 in the [place]  A suicide crisis helpline, such as the [place] at 9471162981. This is open 24 hours a day.  Summary  Generalized anxiety disorder (GAD) is a mental health disorder that involves worry that is  not triggered by a specific event.  People with GAD often worry excessively about many things in their lives, such as their health and family.  GAD may cause physical symptoms such as restlessness, trouble concentrating, sleep problems, frequent sweating, nausea, diarrhea, headaches, and trembling or muscle twitching.  A mental health specialist can help determine which treatment is best for you. Some people see improvement with one type of therapy. However, other people require a combination of therapies. This information is not intended to replace advice given to you by your health care provider. Make sure you discuss any questions you have with your health care provider. Document Released: 09/03/2012 Document Revised: 03/29/2016 Document Reviewed: 03/29/2016 Elsevier Interactive Patient Education  2018 [place] Disorder If you have been diagnosed with post-traumatic stress disorder (PTSD), you may be relieved that you now know why you have felt or behaved a certain way. Still, you may feel overwhelmed about the treatment ahead. You may also wonder how to get the support you need and how to deal with the condition day-to-day. If you are living with PTSD, there are ways to help you recover from it and manage your symptoms. How to manage lifestyle changes Managing stress Stress is your body's reaction to life changes and events, both good and bad. Stress can make PTSD worse. Take the following steps to cope with  stress:  Talk with your health care provider or a counselor if you would like to learn more about techniques to reduce your stress. He or she may suggest some stress reduction techniques such as: ? Muscle relaxation exercises. ? Regular exercise. ? Meditation, yoga, or other mind-body exercises. ? Breathing exercises. ? Listening to quiet music. ? Spending time outside.  Maintain a healthy lifestyle. Eat a healthy diet, exercise regularly,  get plenty of sleep, and take time to relax.  Spend time with others. Talk with them about how you are feeling and what kind of support you need. Try to not isolate yourself, even though you may feel like doing that. Isolating yourself can delay your recovery.  Do activities and hobbies that you enjoy.  Pace yourself when doing stressful things. Take breaks, and reward yourself when you finish. Make sure that you do not overload your schedule.  Medicines Your health care provider may suggest certain medicines if he or she feels that they will help to improve your condition. Antidepressants or antipsychotic medicines may be used to treat PTSD. Avoid using alcohol and other substances that may prevent your medicines from working properly (may interact). It is also important to:  Talk with your pharmacist or health care provider about all medicines that you take, their possible side effects, and which medicines are safe to take together.  Make it your goal to take part in all treatment decisions (shared decision-making). Ask about possible side effects of medicines that your health care provider recommends, and tell him or her how you feel about having those side effects. It is best if shared decision-making with your health care provider is part of your total treatment plan.  If your health care provider prescribes a medicine, you may not notice the full benefits of it for 4-8 weeks. Most people who are treated for PTSD need to take medicine for at least 6-12 months after they feel better. If you are taking medicines as part of your treatment, do not stop taking medicines before you ask your health care provider if it is safe to stop. You may need to have the medicine slowly decreased (tapered) over time to lower the risk of harmful side effects. Relationships Many people who have PTSD have difficulty trusting others. Make an effort to:  Take risks and develop trust with close friends and family  members. Developing trust in others can help you feel safe and connect you with emotional support.  Be open and honest about your feelings.  Try to have fun and relax in safe spaces, such as with friends and family.  Think about going to couples counseling, family education classes, or family therapy. Your loved ones may not always know how to be supportive. Therapy can be helpful for everyone.  How to recognize changes in your condition Be aware of your symptoms and how often you have them. The following symptoms mean that you need to seek help for your PTSD:  You feel suspicious and angry.  You have repeated flashbacks.  You avoid going out or being with others.  You have an increasing number of fights with close friends or family members, such as your spouse.  You have thoughts about hurting yourself or others.  You cannot get relief from feelings of depression or anxiety.  Where to find support Talking to others  Explain that PTSD is a mental health problem. It is something that a person can develop after experiencing or seeing a life-threatening event. Tell  them that PTSD makes you feel stress like you did during the event.  Talk to your loved ones about the symptoms you have. Also tell them what things or situations can cause symptoms to start (are triggers for you).  Assure your loved ones that there are treatments to help PTSD. Discuss possibly seeking family therapy or couples therapy.  If you are worried or fearful about seeking treatment, ask for support. Finances Not all insurance plans cover mental health care, so it is important to check with your insurance carrier. If paying for co-pays or counseling services is a problem, search for a local or county mental health care center. Public mental health care services may be offered there at a low cost or no cost when you are not able to see a private health care provider. If you are a veteran, contact a local veterans  organization or veterans hospital for more information. If you are taking medicine for PTSD, you may be able to get the genericform, which may be less expensive than brand-name medicine. Some makers of prescription medicines also offer help to patients who cannot afford the medicines that they need. Community resources  Find a support group in your community. Often, groups are available for [employer] veterans, trauma victims, and family members or caregivers.  Look into volunteer opportunities. Taking part in these can help you feel more connected to your community.  Contact a local organization to find out if you are eligible for a service dog.  Keep daily contact with at least one trusted friend or family member. Follow these instructions at home: Lifestyle  Exercise regularly. Try to do 30 or more minutes of physical activity on most days of the week.  Try to get 7-9 hours of sleep each night. To help with sleep: ? Keep your bedroom cool and dark. ? Do not eat a heavy meal during the hour before you go to bed. ? Do not drink alcohol or caffeinated drinks before bed. ? Avoid screen time before bedtime. This means avoiding use of your TV, computer, tablet, and cell phone.  Avoid using alcohol or drugs.  Practice self-soothing skills and use them daily.  Try to have fun and seek humor in your life. General instructions  If your PTSD is affecting your marriage or family, seek help from a family therapist.  Take over-the-counter and prescription medicines only as told by your health care provider.  Make sure to let all of your health care providers know that you have PTSD. This is especially important if you are having surgery or need to be admitted to the hospital.  Keep all follow-up visits as told by your health care providers. This is important. Where to find more information: Go to this website to find more information about PTSD, treatment for PTSD, and how to get  support:  [facility] for PTSD: www.ptsd.PaintballBuzz.cz  Contact a health care provider if:  Your symptoms get worse or they do not get better. Get help right away if:  You have thoughts about hurting yourself or others. If you ever feel like you may hurt yourself or others, or have thoughts about taking your own life, get help right away. You can go to your nearest emergency department or call:  Your local emergency services (911 in the [place]  A suicide crisis helpline, such as the [place] at 435-143-4981. This is open 24-hours a day.  Summary  If you are living with PTSD, there are ways to  help you recover from it and manage your symptoms.  Find supportive environments and people who understand PTSD. Spend time in those places, and maintain contact with those people.  Work with your health care team to create a management plan that includes counseling, stress management techniques, and healthy lifestyle habits. This information is not intended to replace advice given to you by your health care provider. Make sure you discuss any questions you have with your health care provider. Document Released: 09/08/2016 Document Revised: 09/08/2016 Document Reviewed: 09/08/2016 Elsevier Interactive Patient Education  [employer].

## 2018-03-02 ENCOUNTER — Other Ambulatory Visit (INDEPENDENT_AMBULATORY_CARE_PROVIDER_SITE_OTHER): Payer: BC Managed Care – PPO

## 2018-03-02 ENCOUNTER — Telehealth: Payer: Self-pay | Admitting: Internal Medicine

## 2018-03-02 DIAGNOSIS — F329 Major depressive disorder, single episode, unspecified: Secondary | ICD-10-CM

## 2018-03-02 DIAGNOSIS — R739 Hyperglycemia, unspecified: Secondary | ICD-10-CM

## 2018-03-02 DIAGNOSIS — F32A Depression, unspecified: Secondary | ICD-10-CM

## 2018-03-02 DIAGNOSIS — Z13220 Encounter for screening for lipoid disorders: Secondary | ICD-10-CM | POA: Diagnosis not present

## 2018-03-02 DIAGNOSIS — Z1329 Encounter for screening for other suspected endocrine disorder: Secondary | ICD-10-CM

## 2018-03-02 DIAGNOSIS — Z1389 Encounter for screening for other disorder: Secondary | ICD-10-CM

## 2018-03-02 DIAGNOSIS — Z0000 Encounter for general adult medical examination without abnormal findings: Secondary | ICD-10-CM | POA: Diagnosis not present

## 2018-03-02 DIAGNOSIS — E559 Vitamin D deficiency, unspecified: Secondary | ICD-10-CM | POA: Diagnosis not present

## 2018-03-02 DIAGNOSIS — F419 Anxiety disorder, unspecified: Secondary | ICD-10-CM | POA: Diagnosis not present

## 2018-03-02 DIAGNOSIS — Z1159 Encounter for screening for other viral diseases: Secondary | ICD-10-CM

## 2018-03-02 DIAGNOSIS — Z0184 Encounter for antibody response examination: Secondary | ICD-10-CM

## 2018-03-02 LAB — CBC WITH DIFFERENTIAL/PLATELET
BASOS PCT: 0.7 % (ref 0.0–3.0)
Basophils Absolute: 0 10*3/uL (ref 0.0–0.1)
EOS ABS: 0.1 10*3/uL (ref 0.0–0.7)
Eosinophils Relative: 1.6 % (ref 0.0–5.0)
HCT: 47.3 % (ref 39.0–52.0)
Hemoglobin: 15.8 g/dL (ref 13.0–17.0)
LYMPHS ABS: 2.1 10*3/uL (ref 0.7–4.0)
Lymphocytes Relative: 40.6 % (ref 12.0–46.0)
MCHC: 33.4 g/dL (ref 30.0–36.0)
MCV: 90.7 fl (ref 78.0–100.0)
MONO ABS: 0.5 10*3/uL (ref 0.1–1.0)
Monocytes Relative: 10.6 % (ref 3.0–12.0)
NEUTROS ABS: 2.4 10*3/uL (ref 1.4–7.7)
Neutrophils Relative %: 46.5 % (ref 43.0–77.0)
PLATELETS: 276 10*3/uL (ref 150.0–400.0)
RBC: 5.21 Mil/uL (ref 4.22–5.81)
RDW: 13.2 % (ref 11.5–15.5)
WBC: 5.1 10*3/uL (ref 4.0–10.5)

## 2018-03-02 LAB — COMPREHENSIVE METABOLIC PANEL
ALT: 23 U/L (ref 0–53)
AST: 20 U/L (ref 0–37)
Albumin: 4.5 g/dL (ref 3.5–5.2)
Alkaline Phosphatase: 53 U/L (ref 39–117)
BUN: 18 mg/dL (ref 6–23)
CHLORIDE: 103 meq/L (ref 96–112)
CO2: 27 meq/L (ref 19–32)
CREATININE: 1.1 mg/dL (ref 0.40–1.50)
Calcium: 9.7 mg/dL (ref 8.4–10.5)
GFR: 78.72 mL/min (ref >60.00)
Glucose, Bld: 104 mg/dL — ABNORMAL HIGH (ref 70–99)
POTASSIUM: 4.4 meq/L (ref 3.5–5.1)
SODIUM: 138 meq/L (ref 135–145)
Total Bilirubin: 2 mg/dL — ABNORMAL HIGH (ref 0.2–1.2)
Total Protein: 7.2 g/dL (ref 6.0–8.3)

## 2018-03-02 LAB — VITAMIN D 25 HYDROXY (VIT D DEFICIENCY, FRACTURES): VITD: 33.8 ng/mL (ref 30.00–100.00)

## 2018-03-02 LAB — LIPID PANEL
CHOLESTEROL: 178 mg/dL (ref 0–200)
HDL: 41.3 mg/dL (ref >39.00)
LDL CALC: 120 mg/dL — AB (ref 0–99)
NonHDL: 136.55
TRIGLYCERIDES: 81 mg/dL (ref 0.0–149.0)
Total CHOL/HDL Ratio: 4
VLDL: 16.2 mg/dL (ref 0.0–40.0)

## 2018-03-02 LAB — HEMOGLOBIN A1C: Hgb A1c MFr Bld: 5.9 % (ref 4.6–6.5)

## 2018-03-02 LAB — T4, FREE: Free T4: 0.78 ng/dL (ref 0.60–1.60)

## 2018-03-02 LAB — TSH: TSH: 1.88 u[IU]/mL (ref 0.35–4.50)

## 2018-03-02 NOTE — Telephone Encounter (Signed)
Copied from [place] [phone]. Topic: General - Other >> Mar 02, 2018 10:55 AM Keene Breath wrote: Reason for CRM: Patient called to request lab results.  Stated that he had just missed a call and would like to speak with the nurse.  CB# [phone]

## 2018-03-03 LAB — URINALYSIS, ROUTINE W REFLEX MICROSCOPIC
BILIRUBIN UA: NEGATIVE
Glucose, UA: NEGATIVE
KETONES UA: NEGATIVE
Leukocytes, UA: NEGATIVE
Nitrite, UA: NEGATIVE
Protein, UA: NEGATIVE
RBC UA: NEGATIVE
Specific Gravity, UA: 1.025 (ref 1.005–1.030)
Urobilinogen, Ur: 0.2 mg/dL (ref 0.2–1.0)
pH, UA: 6 (ref 5.0–7.5)

## 2018-03-05 LAB — MEASLES/MUMPS/RUBELLA IMMUNITY
Mumps IgG: <9 AU/mL — ABNORMAL LOW
Rubeola IgG: <25 AU/mL — ABNORMAL LOW

## 2018-03-07 ENCOUNTER — Encounter: Payer: Self-pay | Admitting: Internal Medicine

## 2018-03-07 ENCOUNTER — Ambulatory Visit: Payer: BC Managed Care – PPO | Admitting: Internal Medicine

## 2018-03-07 VITALS — BP 122/72 | HR 75 | Temp 98.5°F | Ht 73.0 in | Wt 201.4 lb

## 2018-03-07 DIAGNOSIS — F419 Anxiety disorder, unspecified: Secondary | ICD-10-CM | POA: Diagnosis not present

## 2018-03-07 DIAGNOSIS — R002 Palpitations: Secondary | ICD-10-CM | POA: Insufficient documentation

## 2018-03-07 DIAGNOSIS — K589 Irritable bowel syndrome without diarrhea: Secondary | ICD-10-CM

## 2018-03-07 DIAGNOSIS — F32A Depression, unspecified: Secondary | ICD-10-CM

## 2018-03-07 DIAGNOSIS — R42 Dizziness and giddiness: Secondary | ICD-10-CM

## 2018-03-07 DIAGNOSIS — F329 Major depressive disorder, single episode, unspecified: Secondary | ICD-10-CM

## 2018-03-07 DIAGNOSIS — R17 Unspecified jaundice: Secondary | ICD-10-CM | POA: Diagnosis not present

## 2018-03-07 DIAGNOSIS — R0602 Shortness of breath: Secondary | ICD-10-CM | POA: Diagnosis not present

## 2018-03-07 DIAGNOSIS — I493 Ventricular premature depolarization: Secondary | ICD-10-CM | POA: Insufficient documentation

## 2018-03-07 LAB — BILIRUBIN, FRACTIONATED(TOT/DIR/INDIR)
Bilirubin, Direct: 0.2 mg/dL (ref 0.0–0.2)
Indirect Bilirubin: 0.9 mg/dL (calc) (ref 0.2–1.2)
Total Bilirubin: 1.1 mg/dL (ref 0.2–1.2)

## 2018-03-07 MED ORDER — BUPROPION HCL ER (XL) 150 MG PO TB24: 150.0000 mg | ORAL_TABLET | Freq: Two times a day (BID) | ORAL | 12 refills | Status: DC

## 2018-03-07 NOTE — Patient Instructions (Addendum)
Vitamin D3 1000 IU daily  Results for Sergio Garcia, Sergio Garcia (MRN [id number]) as of 03/07/2018 10:41  Ref. Range 03/02/2018 08:30  Rubella Latest Units: index <0.90 (L)  Mumps IgG Latest Units: AU/mL <9.00 (L)  Rubeola IgG Latest Units: AU/mL <25.00 (L)   MMR vaccine call health dept [phone]     Diet for Irritable Bowel Syndrome When you have irritable bowel syndrome (IBS), the foods you eat and your eating habits are very important. IBS may cause various symptoms, such as abdominal pain, constipation, or diarrhea. Choosing the right foods can help ease discomfort caused by these symptoms. Work with your health care provider and dietitian to find the best eating plan to help control your symptoms. What general guidelines do I need to follow?  Keep a food diary. This will help you identify foods that cause symptoms. Write down: ? What you eat and when. ? What symptoms you have. ? When symptoms occur in relation to your meals.  Avoid foods that cause symptoms. Talk with your dietitian about other ways to get the same nutrients that are in these foods.  Eat more foods that contain fiber. Take a fiber supplement if directed by your dietitian.  Eat your meals slowly, in a relaxed setting.  Aim to eat 5-6 small meals per day. Do not skip meals.  Drink enough fluids to keep your urine clear or pale yellow.  Ask your health care provider if you should take an over-the-counter probiotic during flare-ups to help restore healthy gut bacteria.  If you have cramping or diarrhea, try making your meals low in fat and high in carbohydrates. Examples of carbohydrates are pasta, rice, whole grain breads and cereals, fruits, and vegetables.  If dairy products cause your symptoms to flare up, try eating less of them. You might be able to handle yogurt better than other dairy products because it contains bacteria that help with digestion. What foods are not recommended? The following are some foods and  drinks that may worsen your symptoms:  Fatty foods, such as [place] fries.  Milk products, such as cheese or ice cream.  Chocolate.  Alcohol.  Products with caffeine, such as coffee.  Carbonated drinks, such as soda.  The items listed above may not be a complete list of foods and beverages to avoid. Contact your dietitian for more information. What foods are good sources of fiber? Your health care provider or dietitian may recommend that you eat more foods that contain fiber. Fiber can help reduce constipation and other IBS symptoms. Add foods with fiber to your diet a little at a time so that your body can get used to them. Too much fiber at once might cause gas and swelling of your abdomen. The following are some foods that are good sources of fiber:  Apples.  Peaches.  Pears.  Berries.  Figs.  Broccoli (raw).  Cabbage.  Carrots.  Raw peas.  Kidney beans.  Lima beans.  Whole grain bread.  Whole grain cereal.  Where to find more information: [employer] for Functional Gastrointestinal Disorders: www.iffgd.[employer] of Diabetes and Digestive and Kidney Diseases: NetworkAffair.co.za.aspx This information is not intended to replace advice given to you by your health care provider. Make sure you discuss any questions you have with your health care provider. Document Released: 07/30/2003 Document Revised: 10/15/2015 Document Reviewed: 08/09/2013 Elsevier Interactive Patient Education  2018 [employer].

## 2018-03-07 NOTE — Progress Notes (Addendum)
Chief Complaint  Patient presents with  . Follow-up    6wk   F/u  1. Anxiety and depression improved on wellbtruin 150 xl and klonopin 0.5 had to only take 6 pills he wants to hold on therapy for now he would like to inc wellbutrin to bid  2. Palpitations, sob with exertion, lightheaded and dizzy with exertion and FH dad and pGF heart disease  3. IBS sx's controlled on immodium 2 pills 2-3 x per week  4. 02/16/18 had to have stitches to left index finger x 6 and Tdap vaccine utd had 01/04/18 had injury at work went to [facility] ED   Review of Systems  Constitutional: Negative for malaise/fatigue.  HENT: Negative for hearing loss.   Eyes: Negative for blurred vision.  Respiratory: Negative for shortness of breath.   Cardiovascular: Negative for chest pain.  Gastrointestinal: Negative for abdominal pain.  Musculoskeletal: Negative for falls.  Skin: Negative for rash.  Neurological: Negative for headaches.  Psychiatric/Behavioral: Negative for depression.   Past Medical History:  Diagnosis Date  . Anxiety   . Degenerative disc disease   . Depression   . Enteritis   . Gastritis   . GERD (gastroesophageal reflux disease)   . Gilbert's syndrome    hyperbilirubinemia  . IBS (irritable bowel syndrome)   . Inguinal hernia   . Insomnia   . Prostatitis 2008   Past Surgical History:  Procedure Laterality Date  . BACK SURGERY     2 ruptured discs  . CHOLECYSTECTOMY  01/31/14  . ESOPHAGOGASTRODUODENOSCOPY  09/2004   gastritis acute and duodenitis  . ESOPHAGOGASTRODUODENOSCOPY  01-14-14   Dr Allen Norris  . EXPLORATORY LAPAROTOMY  Jan 2015   Dr Burt Knack   Family History  Problem Relation Age of Onset  . Colon polyps Mother   . Cancer Mother        sinus cavity, on XRT  . Heart disease Father   . Heart attack Father        x 2  . Pancreatic cancer Maternal Grandfather   . Diabetes Other        strong FH d both sides of family    Social History   Socioeconomic History  . Marital status:  Married    Spouse name: Not on file  . Number of children: 2  . Years of education: Not on file  . Highest education level: Not on file  Occupational History  . Occupation: Music therapist: [employer]  . Financial resource strain: Not on file  . Food insecurity:    Worry: Not on file    Inability: Not on file  . Transportation needs:    Medical: Not on file    Non-medical: Not on file  Tobacco Use  . Smoking status: Never Smoker  . Smokeless tobacco: Never Used  Substance and Sexual Activity  . Alcohol use: No  . Drug use: No  . Sexual activity: Not on file  Lifestyle  . Physical activity:    Days per week: Not on file    Minutes per session: Not on file  . Stress: Not on file  Relationships  . Social connections:    Talks on phone: Not on file    Gets together: Not on file    Attends religious service: Not on file    Active member of club or organization: Not on file    Attends meetings of clubs or organizations: Not on file  Relationship status: Not on file  . Intimate partner violence:    Fear of current or ex partner: Not on file    Emotionally abused: Not on file    Physically abused: Not on file    Forced sexual activity: Not on file  Other Topics Concern  . Not on file  Social History Narrative   Married.   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   Enjoys watching racing, Dealer, deer hunting.             Current Meds  Medication Sig  . buPROPion (WELLBUTRIN XL) 150 MG 24 hr tablet Take 1 tablet (150 mg total) by mouth daily.  . clonazePAM (KLONOPIN) 0.5 MG tablet Take 1 tablet (0.5 mg total) by mouth daily as needed for anxiety.   Allergies  Allergen Reactions  . Morphine And Related Anaphylaxis  . Morphine Nausea And Vomiting  . Tdap [Tetanus-Diphth-Acell Pertussis] Hives and Rash   Recent Results (from the past 2160 hour(s))  VITAMIN D 25 Hydroxy (Vit-D Deficiency, Fractures)      Status: None   Collection Time: 03/02/18  8:30 AM  Result Value Ref Range   VITD 33.80 30.00 - 100.00 ng/mL  Measles/Mumps/Rubella Immunity     Status: Abnormal   Collection Time: 03/02/18  8:30 AM  Result Value Ref Range   Rubeola IgG <25.00 (L) AU/mL    Comment: AU/mL            Interpretation -----            -------------- <25.00           Negative 25.00-29.99      Equivocal >29.99           Positive . A positive result indicates that the patient has antibody to measles virus. It does not differentiate  between an active or past infection. The clinical  diagnosis must be interpreted in conjunction with  clinical signs and symptoms of the patient.    Mumps IgG <9.00 (L) AU/mL    Comment:  AU/mL           Interpretation -------         ---------------- <9.00             Negative 9.00-10.99        Equivocal >10.99            Positive A positive result indicates that the patient has  antibody to mumps virus. It does not differentiate between an  active or past infection. The clinical diagnosis must be interpreted in conjunction with clinical signs and symptoms of the patient. .    Rubella <0.90 (L) index    Comment:     Index            Interpretation     -----            --------------       <0.90            Not consistent with Immunity     0.90-0.99        Equivocal     > or = 1.00      Consistent with Immunity  . The presence of rubella IgG antibody suggests  immunization or past or current infection with rubella virus.   Hemoglobin A1c     Status: None   Collection Time: 03/02/18  8:30 AM  Result Value Ref Range  Hgb A1c MFr Bld 5.9 4.6 - 6.5 %    Comment: Glycemic Control Guidelines for People with Diabetes:Non Diabetic:  <6%Goal of Therapy: <7%Additional Action Suggested:  >8%   Urinalysis, Routine w reflex microscopic     Status: None   Collection Time: 03/02/18  8:30 AM  Result Value Ref Range   Specific Gravity, UA 1.025 1.005 - 1.030   pH, UA 6.0 5.0 -  7.5   Color, UA Yellow Yellow   Appearance Ur Clear Clear   Leukocytes, UA Negative Negative   Protein, UA Negative Negative/Trace   Glucose, UA Negative Negative   Ketones, UA Negative Negative   RBC, UA Negative Negative   Bilirubin, UA Negative Negative   Urobilinogen, Ur 0.2 0.2 - 1.0 mg/dL   Nitrite, UA Negative Negative   Microscopic Examination Comment     Comment: Microscopic not indicated and not performed.  T4, free     Status: None   Collection Time: 03/02/18  8:30 AM  Result Value Ref Range   Free T4 0.78 0.60 - 1.60 ng/dL    Comment: Specimens from patients who are undergoing biotin therapy and /or ingesting biotin supplements may contain high levels of biotin.  The higher biotin concentration in these specimens interferes with this Free T4 assay.  Specimens that contain high levels  of biotin may cause false high results for this Free T4 assay.  Please interpret results in light of the total clinical presentation of the patient.    TSH     Status: None   Collection Time: 03/02/18  8:30 AM  Result Value Ref Range   TSH 1.88 0.35 - 4.50 uIU/mL  Lipid panel     Status: Abnormal   Collection Time: 03/02/18  8:30 AM  Result Value Ref Range   Cholesterol 178 0 - 200 mg/dL    Comment: ATP III Classification       Desirable:  < 200 mg/dL               Borderline High:  200 - 239 mg/dL          High:  > = 240 mg/dL   Triglycerides 81.0 0.0 - 149.0 mg/dL    Comment: Normal:  <150 mg/dLBorderline High:  150 - 199 mg/dL   HDL 41.30 >39.00 mg/dL   VLDL 16.2 0.0 - 40.0 mg/dL   LDL Cholesterol 120 (H) 0 - 99 mg/dL   Total CHOL/HDL Ratio 4     Comment:                Men          Women1/2 Average Risk     3.4          3.3Average Risk          5.0          4.42X Average Risk          9.6          7.13X Average Risk          15.0          11.0                       NonHDL 136.55     Comment: NOTE:  Non-HDL goal should be 30 mg/dL higher than patient's LDL goal (i.e. LDL goal of < 70  mg/dL, would have non-HDL goal of < 100 mg/dL)  CBC with Differential/Platelet  Status: None   Collection Time: 03/02/18  8:30 AM  Result Value Ref Range   WBC 5.1 4.0 - 10.5 K/uL   RBC 5.21 4.22 - 5.81 Mil/uL   Hemoglobin 15.8 13.0 - 17.0 g/dL   HCT 47.3 39.0 - 52.0 %   MCV 90.7 78.0 - 100.0 fl   MCHC 33.4 30.0 - 36.0 g/dL   RDW 13.2 11.5 - 15.5 %   Platelets 276.0 150.0 - 400.0 K/uL   Neutrophils Relative % 46.5 43.0 - 77.0 %   Lymphocytes Relative 40.6 12.0 - 46.0 %   Monocytes Relative 10.6 3.0 - 12.0 %   Eosinophils Relative 1.6 0.0 - 5.0 %   Basophils Relative 0.7 0.0 - 3.0 %   Neutro Abs 2.4 1.4 - 7.7 K/uL   Lymphs Abs 2.1 0.7 - 4.0 K/uL   Monocytes Absolute 0.5 0.1 - 1.0 K/uL   Eosinophils Absolute 0.1 0.0 - 0.7 K/uL   Basophils Absolute 0.0 0.0 - 0.1 K/uL  Comprehensive metabolic panel     Status: Abnormal   Collection Time: 03/02/18  8:30 AM  Result Value Ref Range   Sodium 138 135 - 145 mEq/L   Potassium 4.4 3.5 - 5.1 mEq/L   Chloride 103 96 - 112 mEq/L   CO2 27 19 - 32 mEq/L   Glucose, Bld 104 (H) 70 - 99 mg/dL   BUN 18 6 - 23 mg/dL   Creatinine, Ser 1.10 0.40 - 1.50 mg/dL   Total Bilirubin 2.0 (H) 0.2 - 1.2 mg/dL   Alkaline Phosphatase 53 39 - 117 U/L   AST 20 0 - 37 U/L   ALT 23 0 - 53 U/L   Total Protein 7.2 6.0 - 8.3 g/dL   Albumin 4.5 3.5 - 5.2 g/dL   Calcium 9.7 8.4 - 10.5 mg/dL   GFR 78.72 >60.00 mL/min   Objective  Body mass index is 26.57 kg/m. Wt Readings from Last 3 Encounters:  03/07/18 201 lb 6 oz (91.3 kg)  01/24/18 203 lb 6.4 oz (92.3 kg)  09/01/17 190 lb (86.2 kg)   Temp Readings from Last 3 Encounters:  03/07/18 98.5 F (36.9 C) (Oral)  01/24/18 98.5 F (36.9 C) (Oral)  09/01/17 98.2 F (36.8 C) (Oral)   BP Readings from Last 3 Encounters:  03/07/18 122/72  01/24/18 120/70  09/01/17 109/83   Pulse Readings from Last 3 Encounters:  03/07/18 75  01/24/18 62  09/01/17 81    Physical Exam  Constitutional: He is oriented  to person, place, and time. Vital signs are normal. He appears well-developed and well-nourished. He is cooperative.  HENT:  Head: Normocephalic and atraumatic.  Mouth/Throat: Oropharynx is clear and moist and mucous membranes are normal.  Eyes: Pupils are equal, round, and reactive to light. Conjunctivae are normal.  Cardiovascular: Normal rate, regular rhythm and normal heart sounds.  PVCs on exam   Pulmonary/Chest: Effort normal and breath sounds normal.  Neurological: He is alert and oriented to person, place, and time. Gait normal.  Skin: Skin is warm, dry and intact.  Psychiatric: He has a normal mood and affect. His speech is normal and behavior is normal. Judgment and thought content normal. Cognition and memory are normal.  Nursing note and vitals reviewed.   Assessment   1. Anxiety and depression improved  2. Palpitations, sob with exertion, lightheadedness, dizziness with exertion c/w PVCS today 3 .IBS D 4. S/p laceration 02/16/18 left hand index finger  5. HM Plan   1.  Inc  wellbutrin to bid prn klonopin 2.  Refer Dr. Rockey Situ holter and echo 3. Prn immodium given fodmap list today  4.monitor 5.  Flu shot utd   utd Tdap had 06/20/11 and 01/04/2018 [facility]  rec MMR vaccine  Completed hep B 3/3 vxs 12/2017 [facility]  Prediabetes 5.9 A1C   Dermatology saw [facility] 2019 left scalp bx negative  Of note EGD 2015 normal  Check fractionated bilirubin today   07/25/2018 stomach bx with EGD Dr. Gustavo Lah mild chronic gastritis and mild foveolar hyperplasia negative H pylori no repeat , neg metaplasia negative barretts Colonoscopy per report due in 5 years  Provider: Dr. Olivia Mackie [name]-Internal Medicine

## 2018-05-29 ENCOUNTER — Ambulatory Visit: Payer: BC Managed Care – PPO | Admitting: Cardiovascular Disease

## 2018-05-29 ENCOUNTER — Ambulatory Visit (INDEPENDENT_AMBULATORY_CARE_PROVIDER_SITE_OTHER): Payer: BC Managed Care – PPO

## 2018-05-29 ENCOUNTER — Encounter: Payer: Self-pay | Admitting: Cardiovascular Disease

## 2018-05-29 VITALS — BP 132/90 | HR 74 | Ht 73.0 in | Wt 203.2 lb

## 2018-05-29 DIAGNOSIS — R002 Palpitations: Secondary | ICD-10-CM

## 2018-05-29 DIAGNOSIS — R0602 Shortness of breath: Secondary | ICD-10-CM

## 2018-05-29 NOTE — Patient Instructions (Signed)
Medication Instructions:  No changes If you need a refill on your cardiac medications before your next appointment, please call your pharmacy.   Lab work: None ordered  Testing/Procedures: Your physician has requested that you have an echocardiogram. Echocardiography is a painless test that uses sound waves to create images of your heart. It provides your doctor with information about the size and shape of your heart and how well your heart's chambers and valves are working. You may receive an ultrasound enhancing agent through an IV if needed to better visualize your heart during the echo.This procedure takes approximately one hour. There are no restrictions for this procedure. This will take place at the [facility] clinic.   Your physician has recommended that you wear a 14 day ZIO event monitor. Event monitors are medical devices that record the heart's electrical activity. Doctors most often [place] these monitors to diagnose arrhythmias. Arrhythmias are problems with the speed or rhythm of the heartbeat. The monitor is a small, portable device. You can wear one while you do your normal daily activities. This is usually used to diagnose what is causing palpitations/syncope (passing out).   Follow-Up: Follow as needed with Dr. Fletcher Anon

## 2018-05-29 NOTE — Progress Notes (Signed)
Cardiology Office Note   Date:  05/29/2018   ID:  PERSEUS WESTALL, DOB 05-Aug-1975, MRN [id number]  PCP:  [name], [name], MD  Cardiologist:   Kathlyn Sacramento, MD   Chief Complaint  Patient presents with  . other    C/o syncope, dizziness, sob and palpitations. Meds reviewed verbally with pt.      History of Present Illness: Sergio Garcia is a 43 y.o. male who was referred by Dr. Terese Door for evaluation of palpitations and shortness of breath.  The patient has no prior cardiac history.  He has known history of anxiety and depression.  He has strong family history of coronary artery disease.  The patient started having palpitations described as skipping in his heart over the last few months.  This is associated with extreme fatigue feeling and dizziness.  He had a syncopal episode 3 weeks ago while he was walking in his house.  He felt extremely dizzy with loss of consciousness without injuries.  He had chest pain going back to the summer of last year.  It was sharp atypical chest pain.  He went to the emergency room at [facility] with negative basic work-up.  He was referred for a treadmill stress test which was negative for ischemia.  He was able to exercise for 11 minutes.  He reports dyspnea with minimal exertion which has been worsening.  No orthopnea, PND or leg edema.    Past Medical History:  Diagnosis Date  . Anxiety   . Degenerative disc disease   . Depression   . Enteritis   . Gastritis   . GERD (gastroesophageal reflux disease)   . Gilbert's syndrome    hyperbilirubinemia  . IBS (irritable bowel syndrome)   . Inguinal hernia   . Insomnia   . Prostatitis 2008    Past Surgical History:  Procedure Laterality Date  . BACK SURGERY     2 ruptured discs  . CHOLECYSTECTOMY  01/31/14  . ESOPHAGOGASTRODUODENOSCOPY  09/2004   gastritis acute and duodenitis  . ESOPHAGOGASTRODUODENOSCOPY  01-14-14   Dr Allen Norris  . EXPLORATORY LAPAROTOMY  Jan 2015   Dr Burt Knack       Current Outpatient Medications  Medication Sig Dispense Refill  . buPROPion (WELLBUTRIN XL) 150 MG 24 hr tablet Take 1 tablet (150 mg total) by mouth 2 (two) times daily. 60 tablet 12  . clonazePAM (KLONOPIN) 0.5 MG tablet Take 1 tablet (0.5 mg total) by mouth daily as needed for anxiety. 30 tablet 2   No current facility-administered medications for this visit.     Allergies:   Morphine and related; Morphine; and Tdap [tetanus-diphth-acell pertussis]    Social History:  The patient  reports that he has never smoked. He has never used smokeless tobacco. He reports current alcohol use. He reports that he does not use drugs.   Family History:  The patient's family history includes Cancer in his mother; Colon polyps in his mother; Diabetes in an other family member; Heart Problems in his paternal grandfather; Heart attack in his father; Heart disease in his father; Pancreatic cancer in his maternal grandfather.    ROS:  Please see the history of present illness.   Otherwise, review of systems are positive for none.   All other systems are reviewed and negative.    PHYSICAL EXAM: VS:  BP 132/90 (BP Location: Right Arm, Patient Position: Sitting, Cuff Size: Normal)   Pulse 74   Ht 6\' 1"  (1.854 m)  Wt 203 lb 4 oz (92.2 kg)   BMI 26.82 kg/m  , BMI Body mass index is 26.82 kg/m. GEN: Well nourished, well developed, in no acute distress  HEENT: normal  Neck: no JVD, carotid bruits, or masses Cardiac: RRR; no murmurs, rubs, or gallops,no edema  Respiratory:  clear to auscultation bilaterally, normal work of breathing GI: soft, nontender, nondistended, + BS MS: no deformity or atrophy  Skin: warm and dry, no rash Neuro:  Strength and sensation are intact Psych: euthymic mood, full affect   EKG:  EKG is ordered today. The ekg ordered today demonstrates normal sinus rhythm with sinus arrhythmia and nonspecific T wave changes.   Recent Labs: 03/02/2018: ALT 23; BUN 18;  Creatinine, Ser 1.10; Hemoglobin 15.8; Platelets 276.0; Potassium 4.4; Sodium 138; TSH 1.88    Lipid Panel    Component Value Date/Time   CHOL 178 03/02/2018 0830   TRIG 81.0 03/02/2018 0830   HDL 41.30 03/02/2018 0830   CHOLHDL 4 03/02/2018 0830   VLDL 16.2 03/02/2018 0830   LDLCALC 120 (H) 03/02/2018 0830      Wt Readings from Last 3 Encounters:  05/29/18 203 lb 4 oz (92.2 kg)  03/07/18 201 lb 6 oz (91.3 kg)  01/24/18 203 lb 6.4 oz (92.3 kg)      PAD Screen 05/29/2018  Previous PAD dx? No  Previous surgical procedure? Yes  Pain with walking? No  Feet/toe relief with dangling? No  Painful, non-healing ulcers? No  Extremities discolored? No      ASSESSMENT AND PLAN:  1.  Palpitations with an episode of syncope 3 weeks ago: Baseline EKG is unremarkable other than sinus arrhythmia.  I requested a 2-week outpatient monitor.  2.  Shortness of breath: No abnormal heart sounds or murmurs by physical exam.  I requested an echocardiogram given persistent symptoms.  3.  Atypical chest pain: The patient was evaluated by a treadmill stress test in August of last year which showed no evidence of ischemia.  Excellent exercise capacity with 11 minutes of exercise.    Disposition:   FU with me as needed  Signed,  Kathlyn Sacramento, MD  05/29/2018 3:06 PM    [facility]

## 2018-06-04 ENCOUNTER — Other Ambulatory Visit: Payer: Self-pay | Admitting: Internal Medicine

## 2018-06-04 ENCOUNTER — Encounter: Payer: Self-pay | Admitting: Internal Medicine

## 2018-06-04 DIAGNOSIS — R1013 Epigastric pain: Secondary | ICD-10-CM

## 2018-06-08 ENCOUNTER — Telehealth: Payer: Self-pay | Admitting: Internal Medicine

## 2018-06-08 NOTE — Telephone Encounter (Signed)
Everything has been faxed.

## 2018-06-08 NOTE — Telephone Encounter (Signed)
Please fax my last note CT scan and telephone encounter where pt requesting GI referral [place]

## 2018-06-09 ENCOUNTER — Other Ambulatory Visit
Admission: RE | Admit: 2018-06-09 | Discharge: 2018-06-09 | Disposition: A | Discharge status: Home / Self Care | Payer: BC Managed Care – PPO | Source: Ambulatory Visit | Attending: Gastroenterology | Admitting: Gastroenterology

## 2018-06-09 DIAGNOSIS — R197 Diarrhea, unspecified: Secondary | ICD-10-CM | POA: Diagnosis present

## 2018-06-09 LAB — C DIFFICILE QUICK SCREEN W PCR REFLEX
C DIFFICLE (CDIFF) ANTIGEN: NEGATIVE
C Diff interpretation: NOT DETECTED
C Diff toxin: NEGATIVE

## 2018-06-21 ENCOUNTER — Ambulatory Visit (INDEPENDENT_AMBULATORY_CARE_PROVIDER_SITE_OTHER): Payer: BC Managed Care – PPO

## 2018-06-21 DIAGNOSIS — R0602 Shortness of breath: Secondary | ICD-10-CM

## 2018-06-26 ENCOUNTER — Ambulatory Visit: Payer: Self-pay | Admitting: Gastroenterology

## 2018-07-04 ENCOUNTER — Other Ambulatory Visit: Payer: Self-pay

## 2018-07-04 ENCOUNTER — Encounter: Payer: Self-pay | Admitting: Emergency Medicine

## 2018-07-04 ENCOUNTER — Ambulatory Visit
Admission: EM | Admit: 2018-07-04 | Discharge: 2018-07-04 | Disposition: A | Discharge status: Home / Self Care | Payer: BC Managed Care – PPO | Attending: Family Medicine | Admitting: Family Medicine

## 2018-07-04 DIAGNOSIS — J069 Acute upper respiratory infection, unspecified: Secondary | ICD-10-CM | POA: Diagnosis not present

## 2018-07-04 MED ORDER — FLUTICASONE PROPIONATE 50 MCG/ACT NA SUSP: 2.0000 | Freq: Every day | NASAL | 0 refills | Status: DC

## 2018-07-04 MED ORDER — AMOXICILLIN-POT CLAVULANATE 875-125 MG PO TABS: 1.0000 | ORAL_TABLET | Freq: Two times a day (BID) | ORAL | 0 refills | Status: DC

## 2018-07-04 MED ORDER — CETIRIZINE-PSEUDOEPHEDRINE ER 5-120 MG PO TB12: 1.0000 | ORAL_TABLET | Freq: Two times a day (BID) | ORAL | 0 refills | Status: DC

## 2018-07-04 NOTE — ED Triage Notes (Signed)
Pt c/o sinus pain, pressure, runny nose, headache. Started 4 days ago.

## 2018-07-04 NOTE — ED Provider Notes (Signed)
[facility]    CSN: [id number] Arrival date & time: 07/04/18  [place]  History   Chief Complaint Chief Complaint  Patient presents with  . Sinus Problem   HPI  43 year old male presents with the above complaints.  Patient reports that he has not felt well since Monday.  He reports sinus pressure and congestion.  Associated headache.  His pressure and congestion is located behind the eyes, around the nasal bridge and of the frontal region.  Reports that he has been sneezing as well.  No documented fever.  Has been taking Advil sinus and Vicks without improvement.  No known exacerbating factors.  No other associated symptoms.  No other complaints.  PMH, Surgical Hx, Family Hx, Social History reviewed and updated as below.  Past Medical History:  Diagnosis Date  . Anxiety   . Degenerative disc disease   . Depression   . Enteritis   . Gastritis   . GERD (gastroesophageal reflux disease)   . Gilbert's syndrome    hyperbilirubinemia  . IBS (irritable bowel syndrome)   . Inguinal hernia   . Insomnia   . Prostatitis 2008   Patient Active Problem List   Diagnosis Date Noted  . Elevated bilirubin 03/07/2018  . Palpitations 03/07/2018  . PVC's (premature ventricular contractions) 03/07/2018  . Back pain 10/06/2016  . Urinary frequency 10/06/2016  . Routine general medical examination at a health care facility 06/08/2015  . Anxiety and depression 01/23/2014  . Generalized anxiety disorder 11/01/2013  . Elevated LFTs 07/26/2013  . GERD (gastroesophageal reflux disease) 12/20/2012  . 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 GILBERT'S SYNDROME 09/28/2009  . IRRITABLE BOWEL SYNDROME 09/28/2009  . 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 ALLERGIC RHINITIS 08/30/2007   Past Surgical History:  Procedure Laterality Date  . BACK SURGERY     2 ruptured discs  . CHOLECYSTECTOMY  01/31/14  . ESOPHAGOGASTRODUODENOSCOPY  09/2004   gastritis acute and  duodenitis  . ESOPHAGOGASTRODUODENOSCOPY  01-14-14   Dr Allen Norris  . EXPLORATORY LAPAROTOMY  Jan 2015   Dr Burt Knack   Home Medications    Prior to Admission medications   Medication Sig Start Date End Date Taking? Authorizing Provider  buPROPion (WELLBUTRIN XL) 150 MG 24 hr tablet Take 1 tablet (150 mg total) by mouth 2 (two) times daily. 03/07/18  Yes [name], [name], MD  clonazePAM (KLONOPIN) 0.5 MG tablet Take 1 tablet (0.5 mg total) by mouth daily as needed for anxiety. 01/24/18  Yes [name], [name], MD  amoxicillin-clavulanate (AUGMENTIN) 875-125 MG tablet Take 1 tablet by mouth every 12 (twelve) hours. 07/04/18   Coral Spikes, DO  cetirizine-pseudoephedrine (ZYRTEC-D) 5-120 MG tablet Take 1 tablet by mouth 2 (two) times daily. 07/04/18   Coral Spikes, DO  fluticasone (FLONASE) 50 MCG/ACT nasal spray Place 2 sprays into both nostrils daily. 07/04/18   Coral Spikes, DO    Family History Family History  Problem Relation Age of Onset  . Colon polyps Mother   . Cancer Mother        sinus cavity, on XRT  . Heart disease Father   . Heart attack Father        x 2  . Pancreatic cancer Maternal Grandfather   . Diabetes Other        strong FH d both sides of family   . Heart Problems Paternal Grandfather     Social History Social History   Tobacco Use  . Smoking  status: Never Smoker  . Smokeless tobacco: Never Used  Substance Use Topics  . Alcohol use: Yes    Comment: occassional  . Drug use: No     Allergies   Morphine and related; Morphine; and Tdap [tetanus-diphth-acell pertussis]   Review of Systems Review of Systems  Constitutional: Negative for fever.  HENT: Positive for congestion, sinus pressure, sinus pain and sneezing.    Physical Exam Triage Vital Signs ED Triage Vitals  Enc Vitals Group     BP 07/04/18 1659 (!) 126/92     Pulse Rate 07/04/18 1659 76     Resp 07/04/18 1659 18     Temp 07/04/18 1659 98.2 F (36.8 C)     Temp Source 07/04/18  1659 Oral     SpO2 07/04/18 1659 98 %     Weight 07/04/18 1657 200 lb (90.7 kg)     Height 07/04/18 1657 6' (1.829 m)     Head Circumference --      Peak Flow --      Pain Score 07/04/18 1657 5     Pain Loc --      Pain Edu? --      Excl. in [place]? --    Updated Vital Signs BP (!) 126/92 (BP Location: Left Arm)   Pulse 76   Temp 98.2 F (36.8 C) (Oral)   Resp 18   Ht 6' (1.829 m)   Wt 90.7 kg   SpO2 98%   BMI 27.12 kg/m   Visual Acuity Right Eye Distance:   Left Eye Distance:   Bilateral Distance:    Right Eye Near:   Left Eye Near:    Bilateral Near:     Physical Exam Vitals signs and nursing note reviewed.  Constitutional:      General: He is not in acute distress.    Appearance: Normal appearance. He is not ill-appearing.  HENT:     Head: Normocephalic and atraumatic.     Right Ear: Tympanic membrane normal.     Left Ear: Tympanic membrane normal.     Nose: Congestion present.     Mouth/Throat:     Pharynx: Oropharynx is clear. No posterior oropharyngeal erythema.  Cardiovascular:     Rate and Rhythm: Normal rate and regular rhythm.  Pulmonary:     Effort: Pulmonary effort is normal.     Breath sounds: Normal breath sounds.  Neurological:     Mental Status: He is alert.  Psychiatric:        Mood and Affect: Mood normal.        Behavior: Behavior normal.    UC Treatments / Results  Labs (all labs ordered are listed, but only abnormal results are displayed) Labs Reviewed - No data to display  EKG None  Radiology No results found.  Procedures Procedures (including critical care time)  Medications Ordered in UC Medications - No data to display  Initial Impression / Assessment and Plan / UC Course  I have reviewed the triage vital signs and the nursing notes.  Pertinent labs & imaging results that were available during my care of the patient were reviewed by me and considered in my medical decision making (see chart for details).      43 year old male presents with an upper respiratory infection.  I informed him that it is too early to tell whether this is bacterial in origin.  Treating with Flonase, Zyrtec-D.  Wait-and-see prescription given for Augmentin to be filled if he fails to improve  or worsens as he has a history of recurrent sinusitis.  Final Clinical Impressions(s) / UC Diagnoses   Final diagnoses:  Upper respiratory tract infection, unspecified type     Discharge Instructions     Rest. Fluids.  Medication as prescribed.  If you fail to improve, start the antibiotic.  Dr. Lacinda Axon    ED Prescriptions    Medication Sig Dispense Auth. Provider   fluticasone (FLONASE) 50 MCG/ACT nasal spray Place 2 sprays into both nostrils daily. 16 g Bert Ptacek G, DO   cetirizine-pseudoephedrine (ZYRTEC-D) 5-120 MG tablet Take 1 tablet by mouth 2 (two) times daily. 30 tablet Jamirra Curnow G, DO   amoxicillin-clavulanate (AUGMENTIN) 875-125 MG tablet Take 1 tablet by mouth every 12 (twelve) hours. 14 tablet Coral Spikes, DO     Controlled Substance Prescriptions [place] Controlled Substance Registry consulted? Not Applicable   Coral Spikes, DO 07/04/18 5329

## 2018-07-04 NOTE — Discharge Instructions (Signed)
Rest. Fluids.  Medication as prescribed.  If you fail to improve, start the antibiotic.  Dr. Lacinda Axon

## 2018-07-24 ENCOUNTER — Telehealth: Payer: Self-pay | Admitting: Internal Medicine

## 2018-07-24 NOTE — Telephone Encounter (Signed)
Copied from [place] [phone]. Topic: Quick Communication - See Telephone Encounter >> Jul 24, 2018 12:47 PM Margot Ables wrote: CRM for notification. See Telephone encounter for: 07/24/18.  Pt called stating that wellbutrin doesn't seem to be working for him. He wants to isolate himself and is irritated easily. He said he has high highs and low lows that vary throughout the day. He said he discussed [place] with Dr. Terese Door and would be willing to see Dr. Ceasar Lund in addition to med changes if advised. He notes no danger to hurting himself or anyone else. He said that he is wondering if PTSD is in play from experiences he's had with his jobs. He wanted appt with Dr. Terese Door sooner than 08/07/2018. Please advise.  [facility] [address] [place] [phone] (Phone) [phone] (Fax)

## 2018-07-25 ENCOUNTER — Other Ambulatory Visit: Payer: Self-pay | Admitting: Internal Medicine

## 2018-07-25 DIAGNOSIS — F32A Depression, unspecified: Secondary | ICD-10-CM

## 2018-07-25 DIAGNOSIS — F419 Anxiety disorder, unspecified: Principal | ICD-10-CM

## 2018-07-25 DIAGNOSIS — F329 Major depressive disorder, single episode, unspecified: Secondary | ICD-10-CM

## 2018-07-25 NOTE — Telephone Encounter (Signed)
Pt calling back as he had not heard back.  Pt states he is getting worse. Needs to speak with the dr about changing his meds. He is off work today and really needs to see the dr [phone].

## 2018-07-25 NOTE — Telephone Encounter (Signed)
I place stat referral to psychiatry this is best they have therapy there as well  Reason for consult anxiety/depression/PTSH  And rec Behavioral health ED visit if worsening before referral can be completed   No different med changes will be made until he sees psychiatry at this point   Thanks [place]

## 2018-07-25 NOTE — Telephone Encounter (Signed)
Returned pt's call.  Pt stated that he is feeling depressed and down all the time.  Pt says that he doesn't want to be around anybody and isolates himself.  Patient denies being in danger of hurting himself or others.  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  Pt said that he is taking 150 mg of Wellbutrin twice a day but doesn't feel a difference with the medicine.  Pt says that he has very high highs and very high lows and would like to see someone this afternoon since he is off of work today.  Informed pt that there are no appts available with any provider in the office this afternoon but offered pt appt for Friday @ 2:00 pm with PCP.  Pt declined appt and would like to wait until being advised by PCP.  Pt is ok if PCP wants to call in a different medication and is open to a referral for [facility].  Pt said he is currently not in counseling.  Informed pt that notes will be forwarded to Dr. Aundra Dubin.  Advised pt to go [facility] ED/BH if he feels he would want to hurt himself or others.  Also gave pt crisis line number to [facility] for MH/BH crisis as a resource.

## 2018-07-26 NOTE — Telephone Encounter (Signed)
Called and spoke w/ pt.  Informed pt that Dr. Aundra Dubin has put a stat order in for psychiatry and will not make any changes to pt's medication until he sees psychiatry per Dr. Claris Gladden note.  Instructed pt to go [facility] ED if symptoms worsen before referral is completed.

## 2018-08-07 ENCOUNTER — Ambulatory Visit: Payer: Self-pay | Admitting: Internal Medicine

## 2018-08-10 ENCOUNTER — Ambulatory Visit: Payer: Self-pay | Admitting: Licensed Clinical Social Worker

## 2018-08-20 ENCOUNTER — Telehealth: Payer: Self-pay | Admitting: Internal Medicine

## 2018-08-20 ENCOUNTER — Encounter: Payer: Self-pay | Admitting: Internal Medicine

## 2018-08-20 NOTE — Telephone Encounter (Signed)
Pt states he needs a note stating he's ok to work up until 08/31/2018 when he sees the Therapist. Dr Aundra Dubin referred pt to see therapist.   Call pt when it's ready @ [phone]. Thank you!

## 2018-08-20 NOTE — Telephone Encounter (Signed)
Patient Had me fax letter to boss.  Letter has been sent

## 2018-08-20 NOTE — Telephone Encounter (Signed)
Letter will be ready 08/21/2018   Does he need it faxed?   Also has psychiatry appt been scheduled? I dont even see therapy appt scheduled melissa not in epic  Thanks [employer] [facility]

## 2018-08-21 NOTE — Telephone Encounter (Signed)
He is scheduled on 4/10 with Midge Minium Scheduled on 4/13 with Dr. Shea Evans

## 2018-08-27 ENCOUNTER — Other Ambulatory Visit: Payer: Self-pay

## 2018-08-27 ENCOUNTER — Encounter: Payer: Self-pay | Admitting: Emergency Medicine

## 2018-08-27 ENCOUNTER — Emergency Department
Admission: EM | Admit: 2018-08-27 | Discharge: 2018-08-27 | Disposition: A | Discharge status: Home / Self Care | Payer: BC Managed Care – PPO | Attending: Emergency Medicine | Admitting: Emergency Medicine

## 2018-08-27 DIAGNOSIS — Y998 Other external cause status: Secondary | ICD-10-CM | POA: Diagnosis not present

## 2018-08-27 DIAGNOSIS — Y9389 Activity, other specified: Secondary | ICD-10-CM | POA: Insufficient documentation

## 2018-08-27 DIAGNOSIS — S39012A Strain of muscle, fascia and tendon of lower back, initial encounter: Secondary | ICD-10-CM

## 2018-08-27 DIAGNOSIS — Z79899 Other long term (current) drug therapy: Secondary | ICD-10-CM | POA: Insufficient documentation

## 2018-08-27 DIAGNOSIS — X509XXA Other and unspecified overexertion or strenuous movements or postures, initial encounter: Secondary | ICD-10-CM | POA: Insufficient documentation

## 2018-08-27 DIAGNOSIS — S3992XA Unspecified injury of lower back, initial encounter: Secondary | ICD-10-CM | POA: Diagnosis present

## 2018-08-27 DIAGNOSIS — Y929 Unspecified place or not applicable: Secondary | ICD-10-CM | POA: Diagnosis not present

## 2018-08-27 MED ORDER — CYCLOBENZAPRINE HCL 5 MG PO TABS: 5.0000 mg | ORAL_TABLET | Freq: Three times a day (TID) | ORAL | 0 refills | Status: DC | PRN

## 2018-08-27 MED ORDER — KETOROLAC TROMETHAMINE 30 MG/ML IJ SOLN
30.0000 mg | Freq: Once | INTRAMUSCULAR | Status: AC
  Administered 2018-08-27: 30 mg via INTRAVENOUS
  Filled 2018-08-27: qty 1

## 2018-08-27 MED ORDER — ORPHENADRINE CITRATE 30 MG/ML IJ SOLN
60.0000 mg | INTRAMUSCULAR | Status: AC
  Administered 2018-08-27: 13:00:00 60 mg via INTRAVENOUS
  Filled 2018-08-27: qty 2

## 2018-08-27 MED ORDER — KETOROLAC TROMETHAMINE 10 MG PO TABS: 10.0000 mg | ORAL_TABLET | Freq: Three times a day (TID) | ORAL | 0 refills | Status: DC

## 2018-08-27 NOTE — ED Triage Notes (Addendum)
Presents with lower back pain    States he was helping someone pull a trailer out of ditch  States he felt his back pull  Hx of back surgery in past  Describes pain a "sharp"  Non radiating but does have some increased pain when lifting right leg

## 2018-08-27 NOTE — Discharge Instructions (Addendum)
Your exam is consistent with a lumbar muscle strain. You do no have any radicular symptoms at this time. Take the prescription meds as directed. Follow-up with Dr. Cari Caraway for ongoing symptoms.

## 2018-08-27 NOTE — ED Provider Notes (Signed)
[facility] Emergency Department Provider Note ____________________________________________  Time seen: 1301  I have reviewed the triage vital signs and the nursing notes.  HISTORY  Chief Complaint  Back Pain  HPI Sergio Garcia is a 43 y.o. male presents to the ED via EMS from home.  He was helping a Industrial/product designer, whose truck trailer had flipped over in a ditch when he sustained an injury.  Patient describes lifting the tongue of the trailer, when he felt a strain to his midline low back.  His past medical history is significant for previous L4-5 discectomy performed in 2009 by Dr. Melina Schools.  Patient has had no interim complaints other than some intermittent muscle strain that did not require medical attention.  He presents now with acute muscle pain and spasm.  He denies any saddle anesthesias, distal paresthesias, foot drop, or incontinence.  He was transferred via EMS from home for evaluation of severe muscle spasm, pain, disability.  Denies any other injury at this time.  Past Medical History:  Diagnosis Date  . Anxiety   . Degenerative disc disease   . Depression   . Enteritis   . Gastritis   . GERD (gastroesophageal reflux disease)   . Gilbert's syndrome    hyperbilirubinemia  . IBS (irritable bowel syndrome)   . Inguinal hernia   . Insomnia   . Prostatitis 2008    Patient Active Problem List   Diagnosis Date Noted  . Elevated bilirubin 03/07/2018  . Palpitations 03/07/2018  . PVC's (premature ventricular contractions) 03/07/2018  . Back pain 10/06/2016  . Urinary frequency 10/06/2016  . Routine general medical examination at a health care facility 06/08/2015  . Anxiety and depression 01/23/2014  . Generalized anxiety disorder 11/01/2013  . Elevated LFTs 07/26/2013  . GERD (gastroesophageal reflux disease) 12/20/2012  . 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 GILBERT'S SYNDROME 09/28/2009  . IRRITABLE BOWEL SYNDROME 09/28/2009  .  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 ALLERGIC RHINITIS 08/30/2007    Past Surgical History:  Procedure Laterality Date  . BACK SURGERY     2 ruptured discs  . CHOLECYSTECTOMY  01/31/14  . ESOPHAGOGASTRODUODENOSCOPY  09/2004   gastritis acute and duodenitis  . ESOPHAGOGASTRODUODENOSCOPY  01-14-14   Dr Allen Norris  . EXPLORATORY LAPAROTOMY  Jan 2015   Dr Burt Knack    Prior to Admission medications   Medication Sig Start Date End Date Taking? Authorizing Provider  pantoprazole (PROTONIX) 40 MG tablet Take 40 mg by mouth daily.   Yes [provider]  buPROPion (WELLBUTRIN XL) 150 MG 24 hr tablet Take 1 tablet (150 mg total) by mouth 2 (two) times daily. 03/07/18   [name], [name], MD  cetirizine-pseudoephedrine (ZYRTEC-D) 5-120 MG tablet Take 1 tablet by mouth 2 (two) times daily. 07/04/18   Coral Spikes, DO  cyclobenzaprine (FLEXERIL) 5 MG tablet Take 1 tablet (5 mg total) by mouth 3 (three) times daily as needed for muscle spasms. 08/27/18   Malkia Nippert, Dannielle Karvonen, PA-C  fluticasone (FLONASE) 50 MCG/ACT nasal spray Place 2 sprays into both nostrils daily. 07/04/18   Coral Spikes, DO  ketorolac (TORADOL) 10 MG tablet Take 1 tablet (10 mg total) by mouth every 8 (eight) hours. 08/27/18   Nate Perri, Dannielle Karvonen, PA-C    Allergies Morphine and related; Morphine; and Tdap [tetanus-diphth-acell pertussis]  Family History  Problem Relation Age of Onset  . Colon polyps Mother   . Cancer Mother  sinus cavity, on XRT  . Heart disease Father   . Heart attack Father        x 2  . Pancreatic cancer Maternal Grandfather   . Diabetes Other        strong FH d both sides of family   . Heart Problems Paternal Grandfather     Social History Social History   Tobacco Use  . Smoking status: Never Smoker  . Smokeless tobacco: Never Used  Substance Use Topics  . Alcohol use: Yes    Comment: occassional  . Drug use: No   Review of Systems  Constitutional:  Negative for fever. Cardiovascular: Negative for chest pain. Respiratory: Negative for shortness of breath. Gastrointestinal: Negative for abdominal pain, vomiting and diarrhea. Genitourinary: Negative for dysuria. Musculoskeletal: Positive for back pain. Skin: Negative for rash. Neurological: Negative for headaches, focal weakness or numbness. ____________________________________________  PHYSICAL EXAM:  VITAL SIGNS: ED Triage Vitals  Enc Vitals Group     BP 08/27/18 1250 106/80     Pulse Rate 08/27/18 1250 76     Resp 08/27/18 1250 16     Temp 08/27/18 1250 98.7 F (37.1 C)     Temp Source 08/27/18 1250 Oral     SpO2 08/27/18 1250 100 %     Weight 08/27/18 1252 195 lb (88.5 kg)     Height 08/27/18 1252 6\' 2"  (1.88 m)     Head Circumference --      Peak Flow --      Pain Score 08/27/18 1252 7     Pain Loc --      Pain Edu? --      Excl. in [place]? --     Constitutional: Alert and oriented. Well appearing and in no distress. Head: Normocephalic and atraumatic. Eyes: Conjunctivae are normal. Normal extraocular movements Cardiovascular: Normal rate, regular rhythm. Normal distal pulses. Respiratory: Normal respiratory effort. No wheezes/rales/rhonchi. Gastrointestinal: Soft and nontender. No distention. Musculoskeletal: Normal spinal alignment without midline tenderness, spasm, or deformity. Slow transition from sit to stand. Nontender with normal range of motion in all extremities.  Neurologic: CN II-XII grossly intact. Normal LE DTRs bilaterally. Normal toe dorsiflexion. Negative seated SLR bilaterally. Normal gait without ataxia. Normal speech and language. No gross focal neurologic deficits are appreciated. Skin:  Skin is warm, dry and intact. No rash noted. ____________________________________________  PROCEDURES  Procedures Toradol 30 mg IVP Norflex 60 mg IVP Fentanyl (prior to arrival via EMS)) ____________________________________________  INITIAL IMPRESSION /  ASSESSMENT AND PLAN / ED COURSE  Patient with ED evaluation of acute low back pain and spasm, following mechanical injury.  Patient's exam is overall reassuring at this time.  No acute neuromuscular deficit is appreciated.  No indication of any acute radiculopathy at this time.  Patient reports improvement of his symptoms after ED medication administration.  He is discharged at this time with prescriptions for ketorolac and cyclobenzaprine, to dose as directed.  He will follow-up with his primary provider or neurologist for ongoing symptoms.  He is provided with a work note for his to schedule days this week.  Return precautions have been reviewed. ____________________________________________  FINAL CLINICAL IMPRESSION(S) / ED DIAGNOSES  Final diagnoses:  Strain of lumbar region, initial encounter      Melvenia Needles, PA-C 08/27/18 1421    Lavonia Drafts, MD 08/27/18 1444

## 2018-08-28 ENCOUNTER — Other Ambulatory Visit: Payer: Self-pay

## 2018-08-28 ENCOUNTER — Emergency Department (HOSPITAL_COMMUNITY)
Admission: EM | Admit: 2018-08-28 | Discharge: 2018-08-28 | Disposition: A | Discharge status: Home / Self Care | Payer: BC Managed Care – PPO | Attending: Emergency Medicine | Admitting: Emergency Medicine

## 2018-08-28 ENCOUNTER — Encounter (HOSPITAL_COMMUNITY): Payer: Self-pay

## 2018-08-28 ENCOUNTER — Emergency Department (HOSPITAL_COMMUNITY): Payer: BC Managed Care – PPO

## 2018-08-28 DIAGNOSIS — Z79899 Other long term (current) drug therapy: Secondary | ICD-10-CM | POA: Diagnosis not present

## 2018-08-28 DIAGNOSIS — X500XXA Overexertion from strenuous movement or load, initial encounter: Secondary | ICD-10-CM | POA: Insufficient documentation

## 2018-08-28 DIAGNOSIS — M5441 Lumbago with sciatica, right side: Secondary | ICD-10-CM | POA: Diagnosis not present

## 2018-08-28 DIAGNOSIS — M545 Low back pain: Secondary | ICD-10-CM | POA: Diagnosis present

## 2018-08-28 MED ORDER — LIDOCAINE 5 % EX PTCH
1.0000 | MEDICATED_PATCH | CUTANEOUS | Status: DC
  Administered 2018-08-28: 1 via TRANSDERMAL
  Filled 2018-08-28: qty 1

## 2018-08-28 NOTE — ED Triage Notes (Signed)
Pt presents to ED with back pain rating 8/10 radiating to right buttocks.  Pt has hx of discectomy in 2009.  Pt pain is at the surgical site scar.  Pt reports helping an older gentleman with a trailer in the ditch, upon lifted the tongue of the trailor and developed a sharp 10/10 pain and had to crawl across the street to his home.  Pt was seen at [facility] yesterday and no imaging was performed.

## 2018-08-28 NOTE — Discharge Instructions (Addendum)
You have been diagnosed today with low back pain with right-sided sciatica.  At this time there does not appear to be the presence of an emergent medical condition, however there is always the potential for conditions to change. Please read and follow the below instructions.  Please return to the Emergency Department immediately for any new or worsening symptoms. Please be sure to follow up with your Primary Care Provider within one week regarding your visit today; please call their office to schedule an appointment even if you are feeling better for a follow-up visit. Please continue to take the muscle exit medication as previously prescribed.  Do not drive or operate machinery or perform other important tasks while taking muscle relaxers as they may make you drowsy.  Additionally you may use the Toradol prescribed to you yesterday to help with your symptoms.  Please drink plenty water while taking Toradol and avoid further NSAID use such as ibuprofen or Motrin as these work in a similar fashion. Please go to your appointment with your neurosurgeon on Thursday for further evaluation and discussion of your visit today.  Get help right away if: You cannot control when you pee (urinate) or poop (have a bowel movement). You have weakness in any of these areas and it gets worse. Lower back. Lower belly (pelvis). Butt (buttocks). Legs. You have redness or swelling of your back. You have a burning feeling when you pee. Get help right away if: You develop new bowel or bladder control problems. You have unusual weakness or numbness in your arms or legs. You develop nausea or vomiting. You have fever. You develop abdominal pain. You feel faint. You have pain or burning when you urinate Any new/concerning or worsening symptoms.  Please read the additional information packets attached to your discharge summary.  Do not take your medicine if  develop an itchy rash, swelling in your mouth or lips,  or difficulty breathing.

## 2018-08-28 NOTE — ED Provider Notes (Signed)
[facility] EMERGENCY DEPARTMENT Provider Note   CSN: [id number] Arrival date & time: 08/28/18  1330    History   Chief Complaint Chief Complaint  Patient presents with  . Back Pain    HPI Sergio Garcia is a 43 y.o. male presented today for lower back pain.  Patient was helping his neighbor move a piece of equipment yesterday when he had a sudden onset of midline lower back pain.  Patient was then brought by EMS to [facility] where he was seen and evaluated.  Patient was treated there and discharged with muscle relaxer.  Patient states that he did initially feel improved at the emergency department however pain returned soon after discharge.  He describes a severe intensity midline lower back pain constant worsened with palpation and movement and only slightly improved with muscle relaxers.  Patient reports that the pain will occasionally radiate down his right glute.  Patient is concerned because imaging was not performed yesterday and he would like x-rays done today.  Of note patient does have history of L4-5 discectomy greater than 10 years ago.  Patient denies history of saddle area paresthesias, urinary retention, bowel/bladder incontinence, history of fever, history of IV drug use, history of cancer or any additional concerns today.     HPI  Past Medical History:  Diagnosis Date  . Anxiety   . Degenerative disc disease   . Depression   . Enteritis   . Gastritis   . GERD (gastroesophageal reflux disease)   . Gilbert's syndrome    hyperbilirubinemia  . IBS (irritable bowel syndrome)   . Inguinal hernia   . Insomnia   . Prostatitis 2008    Patient Active Problem List   Diagnosis Date Noted  . Elevated bilirubin 03/07/2018  . Palpitations 03/07/2018  . PVC's (premature ventricular contractions) 03/07/2018  . Back pain 10/06/2016  . Urinary frequency 10/06/2016  . Routine general medical examination at a health care facility  06/08/2015  . Anxiety and depression 01/23/2014  . Generalized anxiety disorder 11/01/2013  . Elevated LFTs 07/26/2013  . GERD (gastroesophageal reflux disease) 12/20/2012  . 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 GILBERT'S SYNDROME 09/28/2009  . IRRITABLE BOWEL SYNDROME 09/28/2009  . 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 ALLERGIC RHINITIS 08/30/2007    Past Surgical History:  Procedure Laterality Date  . BACK SURGERY     2 ruptured discs  . CHOLECYSTECTOMY  01/31/14  . ESOPHAGOGASTRODUODENOSCOPY  09/2004   gastritis acute and duodenitis  . ESOPHAGOGASTRODUODENOSCOPY  01-14-14   Dr Allen Norris  . EXPLORATORY LAPAROTOMY  Jan 2015   Dr Burt Knack      Home Medications    Prior to Admission medications   Medication Sig Start Date End Date Taking? Authorizing Provider  buPROPion (WELLBUTRIN XL) 150 MG 24 hr tablet Take 1 tablet (150 mg total) by mouth 2 (two) times daily. 03/07/18   [name], [name], MD  cetirizine-pseudoephedrine (ZYRTEC-D) 5-120 MG tablet Take 1 tablet by mouth 2 (two) times daily. 07/04/18   Coral Spikes, DO  cyclobenzaprine (FLEXERIL) 5 MG tablet Take 1 tablet (5 mg total) by mouth 3 (three) times daily as needed for muscle spasms. 08/27/18   Menshew, Dannielle Karvonen, PA-C  fluticasone (FLONASE) 50 MCG/ACT nasal spray Place 2 sprays into both nostrils daily. 07/04/18   Coral Spikes, DO  ketorolac (TORADOL) 10 MG tablet Take 1 tablet (10 mg total) by mouth every 8 (eight) hours. 08/27/18  Menshew, Dannielle Karvonen, PA-C  pantoprazole (PROTONIX) 40 MG tablet Take 40 mg by mouth daily.    [provider]    Family History Family History  Problem Relation Age of Onset  . Colon polyps Mother   . Cancer Mother        sinus cavity, on XRT  . Heart disease Father   . Heart attack Father        x 2  . Pancreatic cancer Maternal Grandfather   . Diabetes Other        strong FH d both sides of family   . Heart Problems Paternal Grandfather      Social History Social History   Tobacco Use  . Smoking status: Never Smoker  . Smokeless tobacco: Never Used  Substance Use Topics  . Alcohol use: Yes    Comment: occassional  . Drug use: No     Allergies   Morphine and related; Morphine; and Tdap [tetanus-diphth-acell pertussis]   Review of Systems Review of Systems  Constitutional: Negative.  Negative for chills and fever.  Respiratory: Negative.  Negative for cough and shortness of breath.   Cardiovascular: Negative.  Negative for chest pain.  Gastrointestinal: Negative.  Negative for abdominal pain, diarrhea, nausea and vomiting.  Genitourinary: Negative.  Negative for dysuria, hematuria, scrotal swelling and testicular pain.  Musculoskeletal: Positive for back pain. Negative for arthralgias, myalgias and neck pain.  Skin: Negative.  Negative for color change and wound.  Neurological: Negative for weakness, numbness and headaches.       Denies saddle area paresthesias Denies bowel/bladder incontinence Denies urinary retention   Physical Exam Updated Vital Signs BP (!) 120/97 (BP Location: Right Arm)   Pulse 84   Temp 98.8 F (37.1 C) (Oral)   Resp 16   Ht 6\' 2"  (1.88 m)   Wt 88.5 kg   SpO2 100%   BMI 25.04 kg/m   Physical Exam Constitutional:      General: He is not in acute distress.    Appearance: Normal appearance. He is not ill-appearing or diaphoretic.  HENT:     Head: Normocephalic and atraumatic. No raccoon eyes or Battle's sign.     Jaw: There is normal jaw occlusion. No trismus.     Right Ear: Tympanic membrane, ear canal and external ear normal. No hemotympanum.     Left Ear: Tympanic membrane, ear canal and external ear normal. No hemotympanum.     Nose: Nose normal.     Mouth/Throat:     Lips: Pink.     Mouth: Mucous membranes are moist.     Pharynx: Oropharynx is clear. Uvula midline.  Eyes:     General: Vision grossly intact. Gaze aligned appropriately.     Extraocular Movements:  Extraocular movements intact.     Conjunctiva/sclera: Conjunctivae normal.     Pupils: Pupils are equal, round, and reactive to light.  Neck:     Musculoskeletal: Normal range of motion and neck supple. No neck rigidity.     Trachea: Trachea and phonation normal. No tracheal tenderness or tracheal deviation.  Cardiovascular:     Rate and Rhythm: Normal rate and regular rhythm.     Pulses:          Radial pulses are 2+ on the right side and 2+ on the left side.       Dorsalis pedis pulses are 2+ on the right side and 2+ on the left side.       Posterior tibial  pulses are 2+ on the right side and 2+ on the left side.     Heart sounds: Normal heart sounds.  Pulmonary:     Effort: Pulmonary effort is normal. No respiratory distress.     Breath sounds: Normal breath sounds and air entry. No decreased breath sounds or rhonchi.  Abdominal:     General: Bowel sounds are normal. There is no distension.     Palpations: Abdomen is soft. There is no pulsatile mass.     Tenderness: There is no abdominal tenderness. There is no right CVA tenderness, left CVA tenderness, guarding or rebound.  Musculoskeletal:       Back:     Comments: No midline C/T spinal tenderness to palpation no deformity, crepitus, or step-off noted. No sign of injury to the neck or back.  Faint lumbar surgical scar present.  Patient with diffuse lower back tenderness to palpation without focal area of pain.  Positive right leg raise.  Feet:     Right foot:     Protective Sensation: 5 sites tested. 5 sites sensed.     Left foot:     Protective Sensation: 5 sites tested. 5 sites sensed.  Skin:    General: Skin is warm and dry.     Capillary Refill: Capillary refill takes less than 2 seconds.  Neurological:     Mental Status: He is alert.     GCS: GCS eye subscore is 4. GCS verbal subscore is 5. GCS motor subscore is 6.     Comments: Speech is clear and goal oriented, follows commands Major Cranial nerves without deficit,  no facial droop Normal strength in upper and lower extremities bilaterally including dorsiflexion and plantar flexion, strong and equal grip strength DTR 2+ bilateral patella, no clonus of the feet Sensation normal to light and sharp touch Moves extremities without ataxia, coordination intact Slow but steady gait secondary to pain  Psychiatric:        Behavior: Behavior is cooperative.    ED Treatments / Results  Labs (all labs ordered are listed, but only abnormal results are displayed) Labs Reviewed - No data to display  EKG None  Radiology Dg Lumbar Spine Complete  Result Date: 08/28/2018 CLINICAL DATA:  Low back pain, lifting injury, history of lumbar degenerative disc disease EXAM: LUMBAR SPINE - COMPLETE 4+ VIEW COMPARISON:  11/09/2016 FINDINGS: Normal alignment. Preserved vertebral body heights. Minor disc space narrowing at L4-5 and L5-S1. No acute osseous finding or fracture. Facets are aligned. No pars defects. Normal appearing pedicles and SI joints. Normal bowel gas pattern.  Remote cholecystectomy. IMPRESSION: Minor degenerative change by plain radiography. No acute osseous finding. Electronically Signed   By: Jerilynn Mages.  Shick M.D.   On: 08/28/2018 14:17    Procedures Procedures (including critical care time)  Medications Ordered in ED Medications  lidocaine (LIDODERM) 5 % 1 patch (1 patch Transdermal Patch Applied 08/28/18 1415)     Initial Impression / Assessment and Plan / ED Course  I have reviewed the triage vital signs and the nursing notes.  Pertinent labs & imaging results that were available during my care of the patient were reviewed by me and considered in my medical decision making (see chart for details).    Sergio Garcia is a 43 y.o. male presenting with lower back pain. Patient states that he pulled his back while attempting to lift an object 1 day ago.  He denies trauma, fever, IV drug use, night sweats, weight loss, cancer, saddle anesthesia,  urinary  rentention, bowel/bladder incontinence. No neurological deficits and normal neuro exam. Normal DTR of bilateral patella and no clonus of the feet.  DG Lumbar:  IMPRESSION:  Minor degenerative change by plain radiography.    No acute osseous finding.   Suspect musculoskeletal etiology of patient's pain. Pain is consistently reproducible with palpation of the back musculature. Abdomen soft/nontender and without pulsatile mass. Patient with equal pedal pulses. Doubt spinal epidural abscess, cauda equina or AAA.  Patient is ambulatory in the emergency department without assistance. RICE protocol and pain medicine indicated and discussed with patient.  Discussed imaging findings with the patient he states understanding.  Patient reports at discharge that he now has an appointment with his neurosurgeon for 08/30/2026, I encouraged him to maintain this appointment for follow-up.  Patient also informed that he may continue to use the previously prescribed Toradol and muscle relaxers and discussed precautions regarding muscle relaxing medications.  At this time there does not appear to be any evidence of an acute emergency medical condition and the patient appears stable for discharge with appropriate outpatient follow up. Diagnosis was discussed with patient who verbalizes understanding of care plan and is agreeable to discharge. I have discussed return precautions with patient who verbalizes understanding of return precautions. Patient encouraged to follow-up with their PCP and his neurosurgeon. All questions answered. Patient has been discharged in good condition.  Patient's case discussed with Dr. Ellender Hose who agrees with plan to discharge with follow-up.   Note: Portions of this report may have been transcribed using voice recognition software. Every effort was made to ensure accuracy; however, inadvertent computerized transcription errors may still be present. Final Clinical Impressions(s) / ED  Diagnoses   Final diagnoses:  Acute bilateral low back pain with right-sided sciatica    ED Discharge Orders    None       Gari Crown 08/28/18 1509    Duffy Bruce, MD 08/29/18 [id number]

## 2018-08-28 NOTE — ED Notes (Addendum)
Patient transported to X-ray 

## 2018-08-31 ENCOUNTER — Ambulatory Visit (INDEPENDENT_AMBULATORY_CARE_PROVIDER_SITE_OTHER): Payer: BC Managed Care – PPO | Admitting: Licensed Clinical Social Worker

## 2018-08-31 ENCOUNTER — Encounter: Payer: Self-pay | Admitting: Licensed Clinical Social Worker

## 2018-08-31 ENCOUNTER — Other Ambulatory Visit: Payer: Self-pay

## 2018-08-31 DIAGNOSIS — F4323 Adjustment disorder with mixed anxiety and depressed mood: Secondary | ICD-10-CM | POA: Diagnosis not present

## 2018-08-31 DIAGNOSIS — F411 Generalized anxiety disorder: Secondary | ICD-10-CM
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
## 2018-09-03 ENCOUNTER — Encounter: Payer: Self-pay | Admitting: Psychiatry

## 2018-09-03 ENCOUNTER — Ambulatory Visit (INDEPENDENT_AMBULATORY_CARE_PROVIDER_SITE_OTHER): Payer: BC Managed Care – PPO | Admitting: Psychiatry

## 2018-09-03 ENCOUNTER — Other Ambulatory Visit: Payer: Self-pay

## 2018-09-03 DIAGNOSIS — F5105 Insomnia due to other mental disorder: Secondary | ICD-10-CM

## 2018-09-03 DIAGNOSIS — F4310 Post-traumatic stress disorder, unspecified: Secondary | ICD-10-CM

## 2018-09-03 DIAGNOSIS — F411 Generalized anxiety disorder: Secondary | ICD-10-CM | POA: Diagnosis not present

## 2018-09-03 MED ORDER — QUETIAPINE FUMARATE 25 MG PO TABS: 25.0000 mg | ORAL_TABLET | Freq: Every day | ORAL | 1 refills | Status: DC

## 2018-09-03 MED ORDER — ESCITALOPRAM OXALATE 5 MG PO TABS: 5.0000 mg | ORAL_TABLET | Freq: Every day | ORAL | 1 refills | Status: DC
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
## 2018-09-03 NOTE — Patient Instructions (Signed)
Quetiapine tablets  What is this medicine?  QUETIAPINE (kwe TYE a peen) is an antipsychotic. It is used to treat schizophrenia and bipolar disorder, also known as manic-depression.  This medicine may be used for other purposes; ask your health care provider or pharmacist if you have questions.  COMMON BRAND NAME(S): Seroquel  What should I tell my health care provider before I take this medicine?  They need to know if you have any of these conditions:  -blockage in your bowel  -cataracts  -constipation  -dehydration  -diabetes  -difficulty swallowing  -glaucoma  -heart disease  -history of breast cancer  -kidney disease  -liver disease  -low blood counts, like low white cell, platelet, or red cell counts  -low blood pressure or dizziness when standing up  -Parkinson's disease  -previous heart attack  -prostate disease  -seizures  -stomach or intestine problems  -suicidal thoughts, plans or attempt; a previous suicide attempt by you or a family member  -thyroid disease  -trouble passing urine  -an unusual or allergic reaction to quetiapine, other medicines, foods, dyes, or preservatives  -pregnant or trying to get pregnant  -breast-feeding  How should I use this medicine?  Take this medicine by mouth. Swallow it with a drink of water. Follow the directions on the prescription label. If it upsets your stomach you can take it with food. Take your medicine at regular intervals. Do not take it more often than directed. Do not stop taking except on the advice of your doctor or health care professional.  A special MedGuide will be given to you by the pharmacist with each prescription and refill. Be sure to read this information carefully each time.  Talk to your pediatrician regarding the use of this medicine in children. While this drug may be prescribed for children as young as 10 years for selected conditions, precautions do apply.  Patients over age 41 years may have a stronger reaction to this medicine and need  smaller doses.  Overdosage: If you think you have taken too much of this medicine contact a poison control center or emergency room at once.  NOTE: This medicine is only for you. Do not share this medicine with others.  What if I miss a dose?  If you miss a dose, take it as soon as you can. If it is almost time for your next dose, take only that dose. Do not take double or extra doses.  What may interact with this medicine?  Do not take this medicine with any of the following medications:  -cisapride  -dofetilide  -dronedarone  -fluconazole  -metoclopramide  -pimozide  -posaconazole  -thioridazine  This medicine may also interact with the following medications:  -alcohol  -antihistamines for allergy cough and cold  -antiviral medicines for HIV or AIDS  -atropine  -certain medicines for bladder problems like oxybutynin, tolterodine  -certain medicines for blood pressure  -certain medicines for depression, anxiety, or psychotic disturbances  -certain medicines for diabetes  -certain medicines for stomach problems like dicyclomine, hyoscyamine  -certain medicines for travel sickness like scopolamine  -certain medicines for Parkinson's disease  -certain medicines for seizures like carbamazepine, phenobarbital, phenytoin  -cimetidine  -erythromycin  -ipratropium  -other medicines that prolong the QT interval (cause an abnormal heart rhythm)  -rifampin  -steroid medicines like prednisone or cortisone  This list may not describe all possible interactions. Give your health care provider a list of all the medicines, herbs, non-prescription drugs, or dietary supplements you use. Also   tell them if you smoke, drink alcohol, or use illegal drugs. Some items may interact with your medicine.  What should I watch for while using this medicine?  Visit your doctor or health care professional for regular checks on your progress. It may be several weeks before you see the full effects of this medicine.  Your health care provider may  suggest that you have your eyes examined prior to starting this medicine, and every 6 months thereafter.  If you have been taking this medicine regularly for some time, do not suddenly stop taking it. You must gradually reduce the dose or your symptoms may get worse. Ask your doctor or health care professional for advice.  Patients and their families should watch out for worsening depression or thoughts of suicide. Also watch out for sudden or severe changes in feelings such as feeling anxious, agitated, panicky, irritable, hostile, aggressive, impulsive, severely restless, overly excited and hyperactive, or not being able to sleep. If this happens, especially at the beginning of antidepressant treatment or after a change in dose, call your health care professional.  You may get dizzy or drowsy. Do not drive, use machinery, or do anything that needs mental alertness until you know how this medicine affects you. Do not stand or sit up quickly, especially if you are an older patient. This reduces the risk of dizzy or fainting spells. Alcohol can increase dizziness and drowsiness. Avoid alcoholic drinks.  Do not treat yourself for colds, diarrhea or allergies. Ask your doctor or health care professional for advice, some ingredients may increase possible side effects.  This medicine can reduce the response of your body to heat or cold. Dress warm in cold weather and stay hydrated in hot weather. If possible, avoid extreme temperatures like saunas, hot tubs, very hot or cold showers, or activities that can cause dehydration such as vigorous exercise.  What side effects may I notice from receiving this medicine?  Side effects that you should report to your doctor or health care professional as soon as possible:  -allergic reactions like skin rash, itching or hives, swelling of the face, lips, or tongue  -changes in vision  -difficulty swallowing  -elevated mood, decreased need for sleep, racing thoughts, impulsive  behavior  -eye pain  -redness, blistering, peeling, or loosening of the skin, including inside the mouth  -restlessness, pacing, inability to keep still  -seizures  -signs and symptoms of a dangerous change in heartbeat or heart rhythm like chest pain; dizziness; fast, irregular heartbeat; palpitations; feeling faint or lightheaded; falls; breathing problems  -signs and symptoms of high blood sugar such as dizziness; dry mouth; dry skin; fruity breath; nausea; stomach pain; increased hunger; increased thirst; increased urination  -signs and symptoms of hypothyroidism like fatigue; increased sensitivity to cold; weight gain; hoarseness; thinning hair  -signs and symptoms of infection like fever; chills; cough; sore throat; pain or trouble passing urine  -signs and symptoms of low blood pressure like dizziness; feeling faint or lightheaded; falls; unusually weak or tired  -signs and symptoms of neuroleptic malignant syndrome (NMS) like confusion; fast, irregular heartbeat; high fever; increased sweating; stiff muscles  -signs and symptoms of a stroke like changes in vision; confusion; trouble speaking or understanding; severe headaches; sudden numbness or weakness of the face, arm or leg; trouble walking; dizziness; loss of balance or coordination  -signs and symptoms of tardive dyskinesia, like uncontrollable head, mouth, neck, arm, or leg movements  -suicidal thoughts, mood changes  Side effects that usually do   NOTE: This sheet is a summary. It may not cover all possible information. If you have questions about this medicine, talk to your doctor, pharmacist, or health care provider.  2019 Elsevier/Gold Standard (2017-04-28 14:16:00) Escitalopram tablets What is this medicine? ESCITALOPRAM (es sye TAL oh pram) is used to treat depression and certain types of anxiety. This medicine may be used for other purposes; ask your health care provider or pharmacist if you have questions. COMMON BRAND NAME(S): Lexapro What should I tell my health care provider before I take this medicine? They need to know if you have any of these conditions: -bipolar disorder or a family history of bipolar disorder -diabetes -glaucoma -heart disease -kidney or liver disease -receiving electroconvulsive therapy -seizures (convulsions) -suicidal thoughts, plans, or attempt by you or a family member -an unusual or allergic reaction to escitalopram, the related drug citalopram, other medicines, foods, dyes, or preservatives -pregnant or trying to become pregnant -breast-feeding How should I use this medicine? Take this medicine by mouth with a glass of water. Follow the directions on the prescription label. You can take it with or without food. If it upsets your stomach, take it with food. Take your medicine at regular intervals. Do not take it more often than directed. Do not stop taking this medicine suddenly except upon the advice of your doctor. Stopping this medicine too quickly may cause serious side effects or your condition may worsen. A special MedGuide will be given to you by the pharmacist with each prescription and refill. Be sure to read this information carefully each time. Talk to your pediatrician regarding the use of this medicine in children. Special care may be needed. Overdosage: If you think you have taken too much of this  medicine contact a poison control center or emergency room at once. NOTE: This medicine is only for you. Do not share this medicine with others. What if I miss a dose? If you miss a dose, take it as soon as you can. If it is almost time for your next dose, take only that dose. Do not take double or extra doses. What may interact with this medicine? Do not take this medicine with any of the following medications: -certain medicines for fungal infections like fluconazole, itraconazole, ketoconazole, posaconazole, voriconazole -cisapride -citalopram -dofetilide -dronedarone -linezolid -MAOIs like Carbex, Eldepryl, Marplan, Nardil, and Parnate -methylene blue (injected into a vein) -pimozide -thioridazine -ziprasidone This medicine may also interact with the following medications: -alcohol -amphetamines -aspirin and aspirin-like medicines -carbamazepine -certain medicines for depression, anxiety, or psychotic disturbances -certain medicines for migraine headache like almotriptan, eletriptan, frovatriptan, naratriptan, rizatriptan, sumatriptan, zolmitriptan -certain medicines for sleep -certain medicines that treat or prevent blood clots like warfarin, enoxaparin, dalteparin -cimetidine -diuretics -fentanyl -furazolidone -isoniazid -lithium -metoprolol -NSAIDs, medicines for pain and inflammation, like ibuprofen or naproxen -other medicines that prolong the QT interval (cause an abnormal heart rhythm) -procarbazine -rasagiline -supplements like St. John's wort, kava kava, valerian -tramadol -tryptophan This list may not describe all possible interactions. Give your health care provider a list of all the medicines, herbs, non-prescription drugs, or dietary supplements you use. Also tell them if you smoke, drink alcohol, or use illegal drugs. Some items may interact with your medicine. What should I watch for while using this medicine? Tell your doctor if your symptoms do not get  better or if they get worse. Visit your doctor or health care professional for regular checks on your progress. Because it may take several weeks to see the full effects  of this medicine, it is important to continue your treatment as prescribed by your doctor. Patients and their families should watch out for new or worsening thoughts of suicide or depression. Also watch out for sudden changes in feelings such as feeling anxious, agitated, panicky, irritable, hostile, aggressive, impulsive, severely restless, overly excited and hyperactive, or not being able to sleep. If this happens, especially at the beginning of treatment or after a change in dose, call your health care professional. Dennis Bast may get drowsy or dizzy. Do not drive, use machinery, or do anything that needs mental alertness until you know how this medicine affects you. Do not stand or sit up quickly, especially if you are an older patient. This reduces the risk of dizzy or fainting spells. Alcohol may interfere with the effect of this medicine. Avoid alcoholic drinks. Your mouth may get dry. Chewing sugarless gum or sucking hard candy, and drinking plenty of water may help. Contact your doctor if the problem does not go away or is severe. What side effects may I notice from receiving this medicine? Side effects that you should report to your doctor or health care professional as soon as possible: -allergic reactions like skin rash, itching or hives, swelling of the face, lips, or tongue -anxious -black, tarry stools -changes in vision -confusion -elevated mood, decreased need for sleep, racing thoughts, impulsive behavior -eye pain -fast, irregular heartbeat -feeling faint or lightheaded, falls -feeling agitated, angry, or irritable -hallucination, loss of contact with reality -loss of balance or coordination -loss of memory -painful or prolonged erections -restlessness, pacing, inability to keep still -seizures -stiff  muscles -suicidal thoughts or other mood changes -trouble sleeping -unusual bleeding or bruising -unusually weak or tired -vomiting Side effects that usually do not require medical attention (report to your doctor or health care professional if they continue or are bothersome): -changes in appetite -change in sex drive or performance -headache -increased sweating -indigestion, nausea -tremors This list may not describe all possible side effects. Call your doctor for medical advice about side effects. You may report side effects to FDA at 1-800-FDA-1088. Where should I keep my medicine? Keep out of reach of children. Store at room temperature between 15 and 30 degrees C (59 and 86 degrees F). Throw away any unused medicine after the expiration date. NOTE: This sheet is a summary. It may not cover all possible information. If you have questions about this medicine, talk to your doctor, pharmacist, or health care provider.  2019 Elsevier/Gold Standard (2015-10-12 13:20:23)

## 2018-09-03 NOTE — Progress Notes (Signed)
Tc on 09-03-18 @ 12:46 pt medical and surgical hx was reviewed and updated. Pt allergies reviewed and no changes. Pt medications and pharmacy reviewed and updated. No vitals take due to patient phone visit.

## 2018-09-10 ENCOUNTER — Encounter: Payer: Self-pay | Admitting: Licensed Clinical Social Worker

## 2018-09-10 ENCOUNTER — Other Ambulatory Visit: Payer: Self-pay

## 2018-09-10 ENCOUNTER — Ambulatory Visit (INDEPENDENT_AMBULATORY_CARE_PROVIDER_SITE_OTHER): Payer: BC Managed Care – PPO | Admitting: Licensed Clinical Social Worker

## 2018-09-10 DIAGNOSIS — F4310 Post-traumatic stress disorder, unspecified: Secondary | ICD-10-CM | POA: Diagnosis not present
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
## 2018-09-11 ENCOUNTER — Other Ambulatory Visit: Payer: Self-pay | Admitting: Neurosurgery

## 2018-09-11 DIAGNOSIS — G8929 Other chronic pain: Secondary | ICD-10-CM

## 2018-09-11 DIAGNOSIS — M5416 Radiculopathy, lumbar region: Secondary | ICD-10-CM

## 2018-09-11 DIAGNOSIS — M5441 Lumbago with sciatica, right side: Secondary | ICD-10-CM

## 2018-09-25 ENCOUNTER — Encounter: Payer: Self-pay | Admitting: Licensed Clinical Social Worker

## 2018-09-25 ENCOUNTER — Other Ambulatory Visit: Payer: Self-pay

## 2018-09-25 ENCOUNTER — Ambulatory Visit (INDEPENDENT_AMBULATORY_CARE_PROVIDER_SITE_OTHER): Payer: BC Managed Care – PPO | Admitting: Licensed Clinical Social Worker

## 2018-09-25 DIAGNOSIS — F4310 Post-traumatic stress disorder, unspecified: Secondary | ICD-10-CM
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
## 2018-10-01 ENCOUNTER — Other Ambulatory Visit: Payer: Self-pay

## 2018-10-01 ENCOUNTER — Encounter: Payer: Self-pay | Admitting: Psychiatry

## 2018-10-01 ENCOUNTER — Ambulatory Visit (INDEPENDENT_AMBULATORY_CARE_PROVIDER_SITE_OTHER): Payer: BC Managed Care – PPO | Admitting: Psychiatry

## 2018-10-01 DIAGNOSIS — F411 Generalized anxiety disorder: Secondary | ICD-10-CM

## 2018-10-01 DIAGNOSIS — F5105 Insomnia due to other mental disorder: Secondary | ICD-10-CM | POA: Diagnosis not present

## 2018-10-01 DIAGNOSIS — F4310 Post-traumatic stress disorder, unspecified: Secondary | ICD-10-CM

## 2018-10-01 MED ORDER — ESCITALOPRAM OXALATE 10 MG PO TABS: 10.0000 mg | ORAL_TABLET | Freq: Every day | ORAL | 0 refills | Status: DC

## 2018-10-01 NOTE — Progress Notes (Signed)
Virtual Visit via Video Note  I connected with Sergio Garcia on 10/01/18 at  3:30 PM EDT by a video enabled telemedicine application and verified that I am speaking with the correct person using two identifiers.   I discussed the limitations of evaluation and management by telemedicine and the availability of in person appointments. The patient expressed understanding and agreed to proceed.     I discussed the assessment and treatment plan with the patient. The patient was provided an opportunity to ask questions and all were answered. The patient agreed with the plan and demonstrated an understanding of the instructions.   The patient was advised to call back or seek an in-person evaluation if the symptoms worsen or if the condition fails to improve as anticipated.   [facility] MD OP Progress Note  10/01/2018 3:36 PM Sergio Garcia  MRN:  [id number]  Chief Complaint:  Chief Complaint    Follow-up     HPI: Sergio Garcia is a 43 year old Caucasian male, employed, lives in [place], separated from his wife, has a history of mood lability, anxiety, GERD, PVC, Gilbert's syndrome, elevated LFTs was evaluated by telemedicine today.  Patient today reports he is tolerating the Lexapro well.  He has noticed a big difference in his mood symptoms since he  started the medication.  He however is interested in a dosage increase today since he is still on 5 mg.  He reports sleep is improved on Seroquel.  He denies any tremors, stiffness, increased appetite or weight changes.  He continues to be in psychotherapy sessions with Ms. Alden Hipp which he reports is going well.  He reports he is coping with the COVID-19 outbreak well.  He reports he has to work every other week now and still get the same pay.  He is happy about the same.  He denies any suicidality, homicidality or perceptual disturbances. Visit Diagnosis:    ICD-10-CM   1. PTSD (post-traumatic stress disorder) F43.10 escitalopram (LEXAPRO) 10  MG tablet  2. Generalized anxiety disorder F41.1 escitalopram (LEXAPRO) 10 MG tablet  3. Insomnia due to mental condition F51.05     Past Psychiatric History: I have reviewed past psychiatric history from my progress note on 09/03/2018.  Past trials of Wellbutrin, Zoloft, Effexor, Klonopin.  Past Medical History:  Past Medical History:  Diagnosis Date  . Anxiety   . Degenerative disc disease   . Depression   . Enteritis   . Gastritis   . GERD (gastroesophageal reflux disease)   . Gilbert's syndrome    hyperbilirubinemia  . IBS (irritable bowel syndrome)   . Inguinal hernia   . Insomnia   . Prostatitis 2008    Past Surgical History:  Procedure Laterality Date  . BACK SURGERY     2 ruptured discs  . CHOLECYSTECTOMY  01/31/14  . ESOPHAGOGASTRODUODENOSCOPY  09/2004   gastritis acute and duodenitis  . ESOPHAGOGASTRODUODENOSCOPY  01-14-14   Dr Allen Norris  . EXPLORATORY LAPAROTOMY  Jan 2015   Dr Burt Knack    Family Psychiatric History: Reviewed family psychiatric history from my progress note on 09/03/2018.  Family History:  Family History  Problem Relation Age of Onset  . Colon polyps Mother   . Cancer Mother        sinus cavity, on XRT  . Heart disease Father   . Heart attack Father        x 2  . Pancreatic cancer Maternal Grandfather   . Diabetes Other  strong FH d both sides of family   . Heart Problems Paternal Grandfather   . Mental illness Neg Hx     Social History: Reviewed social history from my progress note on 09/03/2018. Social History   Socioeconomic History  . Marital status: Legally Separated    Spouse name: Not on file  . Number of children: 2  . Years of education: Not on file  . Highest education level: High school graduate  Occupational History  . Occupation: Music therapist: [employer]  . Financial resource strain: Not hard at all  . Food insecurity:    Worry: Never true    Inability: Never true  .  Transportation needs:    Medical: No    Non-medical: No  Tobacco Use  . Smoking status: Never Smoker  . Smokeless tobacco: Never Used  Substance and Sexual Activity  . Alcohol use: Yes    Comment: occassional social  . Drug use: No  . Sexual activity: Yes  Lifestyle  . Physical activity:    Days per week: 0 days    Minutes per session: 0 min  . Stress: Very much  Relationships  . Social connections:    Talks on phone: Not on file    Gets together: Not on file    Attends religious service: 1 to 4 times per year    Active member of club or organization: No    Attends meetings of clubs or organizations: Never    Relationship status: Separated  Other Topics Concern  . Not on file  Social History Narrative   Married.   Lives in [place]    2 children.   Enjoys watching racing, Dealer, deer hunting.              Allergies:  Allergies  Allergen Reactions  . Morphine And Related Anaphylaxis  . Morphine Nausea And Vomiting  . Tdap [Tetanus-Diphth-Acell Pertussis] Hives and Rash    Metabolic Disorder Labs: Lab Results  Component Value Date   HGBA1C 5.9 03/02/2018   No results found for: PROLACTIN Lab Results  Component Value Date   CHOL 178 03/02/2018   TRIG 81.0 03/02/2018   HDL 41.30 03/02/2018   CHOLHDL 4 03/02/2018   VLDL 16.2 03/02/2018   LDLCALC 120 (H) 03/02/2018   LDLCALC 85 06/08/2015   Lab Results  Component Value Date   TSH 1.88 03/02/2018   TSH 1.73 06/08/2015    Therapeutic Level Labs: No results found for: LITHIUM No results found for: VALPROATE No components found for:  CBMZ  Current Medications: Current Outpatient Medications  Medication Sig Dispense Refill  . cetirizine-pseudoephedrine (ZYRTEC-D) 5-120 MG tablet Take 1 tablet by mouth 2 (two) times daily. 30 tablet 0  . cyclobenzaprine (FLEXERIL) 5 MG tablet Take 1 tablet (5 mg total) by mouth 3 (three) times daily as needed for muscle spasms. 15  tablet 0  . escitalopram (LEXAPRO) 10 MG tablet Take 1 tablet (10 mg total) by mouth daily. 30 tablet 0  . ketorolac (TORADOL) 10 MG tablet Take 1 tablet (10 mg total) by mouth every 8 (eight) hours. 15 tablet 0  . methylPREDNISolone (MEDROL DOSEPAK) 4 MG TBPK tablet Follow package directions.    . pantoprazole (PROTONIX) 40 MG tablet Take 40 mg by mouth daily.    . QUEtiapine (SEROQUEL) 25 MG tablet Take 1 tablet (25 mg total) by mouth at bedtime. 30 tablet 1  No current facility-administered medications for this visit.      Musculoskeletal: Strength & Muscle Tone: within normal limits Gait & Station: normal Patient leans: N/A  Psychiatric Specialty Exam: Review of Systems  Psychiatric/Behavioral: The patient is nervous/anxious.   All other systems reviewed and are negative.   There were no vitals taken for this visit.There is no height or weight on file to calculate BMI.  General Appearance: Casual  Eye Contact:  Fair  Speech:  Clear and Coherent  Volume:  Normal  Mood:  Anxious  Affect:  Congruent  Thought Process:  Goal Directed and Descriptions of Associations: Intact  Orientation:  Full (Time, Place, and Person)  Thought Content: Logical   Suicidal Thoughts:  No  Homicidal Thoughts:  No  Memory:  Immediate;   Fair Recent;   Fair Remote;   Fair  Judgement:  Fair  Insight:  Fair  Psychomotor Activity:  Normal  Concentration:  Concentration: Fair and Attention Span: Fair  Recall:  [employer] of Knowledge: Fair  Language: Fair  Akathisia:  No  Handed:  Right  AIMS (if indicated): Denies tremors, rigidity, stiffness  Assets:  Communication Skills Desire for Improvement Financial Resources/Insurance Housing Intimacy Physical Health Social Support  ADL's:  Intact  Cognition: WNL  Sleep:  improving   Screenings: GAD-7     Office Visit from 01/24/2018 in [place] Visit from 10/24/2016 in [place] at [facility]  Total  GAD-7 Score  14  14    PHQ2-9     Office Visit from 01/24/2018 in [place] from 05/10/2016 in [place] [facility] [place] from 11/25/2015 in [place] Visit from 10/30/2015 in [place]  PHQ-2 Total Score  6  0  0  0  PHQ-9 Total Score  19  -  -  -       Assessment and Plan: Keilan is a 43 year old Caucasian male, history of GAD, PTSD, GERD, IBS, PVC, Gilbert's syndrome, was evaluated by telemedicine today.  Patient is biologically predisposed given his history of trauma.  Patient also has psychosocial stressors of relationship problems, legal problems and so on.  Patient is currently making progress on the current medication regimen and will continue to benefit from medication readjustment as well as psychotherapy sessions.  Plan PTSD-improving Increase Lexapro to 10 mg p.o. daily Continue CBT with Ms. Alden Hipp.  For GAD-improving Continue CBT. Lexapro 10 mg p.o. daily.  For insomnia-improving Seroquel as prescribed.  Follow-up in clinic in 1 month or sooner if needed.  Appointment scheduled for June 11 at 2 PM.  I have spent atleast 15 minutes non face to face with patient today. More than 50 % of the time was spent for psychoeducation and supportive psychotherapy and care coordination.  This note was generated in part or whole with voice recognition software. Voice recognition is usually quite accurate but there are transcription errors that can and very often do occur. I apologize for any typographical errors that were not detected and corrected.       Ursula Alert, MD 10/01/2018, 3:36 PM

## 2018-10-23 ENCOUNTER — Encounter: Payer: Self-pay | Admitting: Licensed Clinical Social Worker

## 2018-10-23 ENCOUNTER — Other Ambulatory Visit: Payer: Self-pay | Admitting: Psychiatry

## 2018-10-23 ENCOUNTER — Ambulatory Visit (INDEPENDENT_AMBULATORY_CARE_PROVIDER_SITE_OTHER): Payer: BC Managed Care – PPO | Admitting: Licensed Clinical Social Worker

## 2018-10-23 ENCOUNTER — Other Ambulatory Visit: Payer: Self-pay

## 2018-10-23 DIAGNOSIS — F411 Generalized anxiety disorder: Secondary | ICD-10-CM

## 2018-10-23 DIAGNOSIS — F5105 Insomnia due to other mental disorder: Secondary | ICD-10-CM

## 2018-10-23 DIAGNOSIS — F4310 Post-traumatic stress disorder, unspecified: Secondary | ICD-10-CM

## 2018-10-23 NOTE — Progress Notes (Signed)
Virtual Visit via Video Note  I connected with Sergio Garcia on 10/23/18 at 10:00 AM EDT by a video enabled telemedicine application and verified that I am speaking with the correct person using two identifiers.   I discussed the limitations of evaluation and management by telemedicine and the availability of in person appointments. The patient expressed understanding and agreed to proceed.  I discussed the assessment and treatment plan with the patient. The patient was provided an opportunity to ask questions and all were answered. The patient agreed with the plan and demonstrated an understanding of the instructions.   The patient was advised to call back or seek an in-person evaluation if the symptoms worsen or if the condition fails to improve as anticipated.  I provided 25 minutes of non-face-to-face time during this encounter.   Sergio Hipp, LCSW    THERAPIST PROGRESS NOTE  Session Time: 1000  Participation Level: Active  Behavioral Response: NeatAlertNA  Type of Therapy: Individual Therapy  Treatment Goals addressed: Coping  Interventions: Supportive  Summary: Sergio Garcia is a 43 y.o. male who presents with continued symptoms related to his diagnosis. Sergio Garcia reports doing well over the last month. He reports he has had no further contact with his wife and they have remained separated. He reports his depression symptoms have improved significantly since our last session, and he has started doing more with his friends. He reports going to ride 4-wheelers and dirt bikes with his friends out of state, and has been able to nurture those friendships that were lost during his marriage. LCSW validated those efforts to improve his support system and build more meaningful relationships with his friends. We discussed how being active can contribute to an improved mood, and LCSW encouraged Sergio Garcia to continue participating in activities with his friends. Additionally, he reports being  back at work has helped his mood improve as well. Sergio Garcia also reported seeing a new woman, which he states has helped as well. "We're taking things very slowly though." Sergio Garcia reported, often, he will feel he is doing something to make his new girlfriend angry or worry that she is upset with him--when she is not. LCSW highlighted how this could be a symptom of his last relationship. Sergio Garcia agreed and reported, "I always felt like I was doing something wrong when I was with her, and Sergio Garcia will tell me to stop apologizing and tell me I'm doing great." LCSW encouraged Sergio Garcia to utilize CBT sills to challenge negative thoughts that are left over from his marriage. LCSW walked Sergio Garcia through this process and encouraged him to utilize this skill when he feels the urge to apologize for a benign behavior. Sergio Garcia expressed understanding and agreement. Sergio Garcia and LCSW discussed moving therapy to an as needed basis as Sergio Garcia reports doing well.   Suicidal/Homicidal: No  Therapist Response: Sergio Garcia continues to work towards his tx goals. He is better able to manage symptoms associated with his separation, and reports his depressed mood has improved. He still experiences anxiety symptoms related to his relationship; however, we are addressing that with CBT skills.   Plan: Return again in 12 weeks.  Diagnosis: Axis I: Post Traumatic Stress Disorder    Axis II: No diagnosis    Sergio Hipp, LCSW 10/23/2018

## 2018-10-26 MED ORDER — ESCITALOPRAM OXALATE 10 MG PO TABS: 10.0000 mg | ORAL_TABLET | Freq: Every day | ORAL | 0 refills | Status: DC

## 2018-10-26 MED ORDER — QUETIAPINE FUMARATE 25 MG PO TABS: 25.0000 mg | ORAL_TABLET | Freq: Every day | ORAL | 1 refills | Status: DC

## 2018-10-26 NOTE — Telephone Encounter (Signed)
Sent medication to pharmacy.   

## 2018-11-01 ENCOUNTER — Other Ambulatory Visit: Payer: Self-pay

## 2018-11-01 ENCOUNTER — Encounter: Payer: Self-pay | Admitting: Psychiatry

## 2018-11-01 ENCOUNTER — Ambulatory Visit (INDEPENDENT_AMBULATORY_CARE_PROVIDER_SITE_OTHER): Payer: BC Managed Care – PPO | Admitting: Psychiatry

## 2018-11-01 DIAGNOSIS — Z5329 Procedure and treatment not carried out because of patient's decision for other reasons: Secondary | ICD-10-CM

## 2018-11-01 NOTE — Progress Notes (Deleted)
Virtual Visit via Video Garcia  I connected with Sergio Garcia on 11/01/18 at  2:00 PM EDT by a video enabled telemedicine application and verified that I am speaking with the correct person using two identifiers.   I discussed the limitations of evaluation and management by telemedicine and the availability of in person appointments. The patient expressed understanding and agreed to proceed.   I discussed the assessment and treatment plan with the patient. The patient was provided an opportunity to ask questions and all were answered. The patient agreed with the plan and demonstrated an understanding of the instructions.   The patient was advised to call back or seek an in-person evaluation if the symptoms worsen or if the condition fails to improve as anticipated.   Sergio Garcia  11/01/2018 1:12 PM HELMUT HENNON  MRN:  [id number]  Chief Complaint:  Chief Complaint    Follow-up     HPI: Sergio Garcia is a 43 year old Caucasian male, employed, lives in [place], separated from his wife, has a history of mood lability, anxiety, GERD, PVC, Gilbert's syndrome, elevated LFTs was evaluated by telemedicine today.   Visit Diagnosis:    ICD-10-CM   1. PTSD (post-traumatic stress disorder)  F43.10   2. Generalized anxiety disorder  F41.1   3. Insomnia due to mental condition  F51.05     Past Psychiatric History: I have reviewed past psychiatric history from my progress Garcia on 09/03/2018.  Past trials of Wellbutrin, Zoloft, Effexor, Klonopin.  Past Medical History:  Past Medical History:  Diagnosis Date  . Anxiety   . Degenerative disc disease   . Depression   . Enteritis   . Gastritis   . GERD (gastroesophageal reflux disease)   . Gilbert's syndrome    hyperbilirubinemia  . IBS (irritable bowel syndrome)   . Inguinal hernia   . Insomnia   . Prostatitis 2008    Past Surgical History:  Procedure Laterality Date  . BACK SURGERY     2 ruptured discs  . CHOLECYSTECTOMY   01/31/14  . ESOPHAGOGASTRODUODENOSCOPY  09/2004   gastritis acute and duodenitis  . ESOPHAGOGASTRODUODENOSCOPY  01-14-14   Dr Allen Norris  . EXPLORATORY LAPAROTOMY  Jan 2015   Dr Burt Knack    Family Psychiatric History: Reviewed family psychiatric history from my progress Garcia on 09/03/2018.  Family History:  Family History  Problem Relation Age of Onset  . Colon polyps Mother   . Cancer Mother        sinus cavity, on XRT  . Heart disease Father   . Heart attack Father        x 2  . Pancreatic cancer Maternal Grandfather   . Diabetes Other        strong FH d both sides of family   . Heart Problems Paternal Grandfather   . Mental illness Neg Hx     Social History: Reviewed social history from my progress Garcia on 09/03/2018. Social History   Socioeconomic History  . Marital status: Legally Separated    Spouse name: Not on file  . Number of children: 2  . Years of education: Not on file  . Highest education level: High school graduate  Occupational History  . 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 Financial resource strain: Not hard at all  . Food insecurity    Worry: Never true    Inability: Never true  . Transportation needs    Medical: No  Non-medical: No  Tobacco Use  . Smoking status: Never Smoker  . Smokeless tobacco: Never Used  Substance and Sexual Activity  . Alcohol use: Yes    Comment: occassional social  . Drug use: No  . Sexual activity: Yes  Lifestyle  . Physical activity    Days per week: 0 days    Minutes per session: 0 min  . Stress: Very much  Relationships  . Social Herbalist on phone: Not on file    Gets together: Not on file    Attends religious service: 1 to 4 times per year    Active member of club or organization: No    Attends meetings of clubs or organizations: Never    Relationship status: Separated  Other Topics Concern  . Not on file  Social History Narrative   Married.   Lives in  [place]    2 children.   Enjoys watching racing, Dealer, deer hunting.              Allergies:  Allergies  Allergen Reactions  . Morphine And Related Anaphylaxis  . Morphine Nausea And Vomiting  . Tdap [Tetanus-Diphth-Acell Pertussis] Hives and Rash    Metabolic Disorder Labs: Lab Results  Component Value Date   HGBA1C 5.9 03/02/2018   No results found for: PROLACTIN Lab Results  Component Value Date   CHOL 178 03/02/2018   TRIG 81.0 03/02/2018   HDL 41.30 03/02/2018   CHOLHDL 4 03/02/2018   VLDL 16.2 03/02/2018   LDLCALC 120 (H) 03/02/2018   LDLCALC 85 06/08/2015   Lab Results  Component Value Date   TSH 1.88 03/02/2018   TSH 1.73 06/08/2015    Therapeutic Level Labs: No results found for: LITHIUM No results found for: VALPROATE No components found for:  CBMZ  Current Medications: Current Outpatient Medications  Medication Sig Dispense Refill  . cetirizine-pseudoephedrine (ZYRTEC-D) 5-120 MG tablet Take 1 tablet by mouth 2 (two) times daily. 30 tablet 0  . cyclobenzaprine (FLEXERIL) 5 MG tablet Take 1 tablet (5 mg total) by mouth 3 (three) times daily as needed for muscle spasms. 15 tablet 0  . escitalopram (LEXAPRO) 10 MG tablet Take 1 tablet (10 mg total) by mouth daily. 30 tablet 0  . ketorolac (TORADOL) 10 MG tablet Take 1 tablet (10 mg total) by mouth every 8 (eight) hours. 15 tablet 0  . methylPREDNISolone (MEDROL DOSEPAK) 4 MG TBPK tablet Follow package directions.    . pantoprazole (PROTONIX) 40 MG tablet Take 40 mg by mouth daily.    . QUEtiapine (SEROQUEL) 25 MG tablet Take 1 tablet (25 mg total) by mouth at bedtime. 30 tablet 1   No current facility-administered medications for this visit.      Musculoskeletal: Strength & Muscle Tone: {desc; muscle tone:32375} Gait & Station: {PE GAIT ED UXNA:35573} Patient leans: {Patient Leans:21022755}  Psychiatric Specialty Exam: ROS  There were no vitals taken for  this visit.There is no height or weight on file to calculate BMI.  General Appearance: {Appearance:22683}  Eye Contact:  {BHH EYE CONTACT:22684}  Speech:  {Speech:22685}  Volume:  {Volume (PAA):22686}  Mood:  {BHH MOOD:22306}  Affect:  {Affect (PAA):22687}  Thought Process:  {Thought Process (PAA):22688}  Orientation:  {BHH ORIENTATION (PAA):22689}  Thought Content: {Thought Content:22690}   Suicidal Thoughts:  {ST/HT (PAA):22692}  Homicidal Thoughts:  {ST/HT (PAA):22692}  Memory:  {BHH UKGURK:27062}  Judgement:  {Judgement (PAA):22694}  Insight:  {Insight (PAA):22695}  Psychomotor Activity:  {Psychomotor (PAA):22696}  Concentration:  {Concentration:21399}  Recall:  {BHH GOOD/FAIR/POOR:22877}  Fund of Knowledge: {BHH GOOD/FAIR/POOR:22877}  Language: {BHH GOOD/FAIR/POOR:22877}  Akathisia:  {BHH YES OR NO:22294}  Handed:  {Handed:22697}  AIMS (if indicated): {Desc; done/not:10129}  Assets:  {Assets (PAA):22698}  ADL's:  {BHH BSW'H:67591}  Cognition: {chl bhh cognition:304700322}  Sleep:  {BHH GOOD/FAIR/POOR:22877}   Screenings: GAD-7     Office Visit from 01/24/2018 in [facility] Office Visit from 10/24/2016 in [place] [facility]  Total GAD-7 Score  14  14    PHQ2-9     Office Visit from 01/24/2018 in [place] Office Visit from 05/10/2016 in [facility] [place] from 11/25/2015 in [place] Office Visit from 10/30/2015 in [place]  PHQ-2 Total Score  6  0  0  0  PHQ-9 Total Score  19  -  -  -       Assessment and Plan: Sergio Garcia is a 43 year old Caucasian male, history of GAD, PTSD, GERD, IBS, PVC, Gilbert's syndrome was evaluated by telemedicine today.  Patient is biologically predisposed given his history of trauma.  He also has psychosocial stressors of relationship problems, legal problems.  He is currently making progress on the current  medications.  Plan PTSD Lexapro 10 mg p.o. daily Continue CBT with Ms. Cecilie Lowers.  For GAD Continue CBT Lexapro 10 mg p.o. daily For insomnia-improving Seroquel as prescribed  Follow-up in clinic in 1 month or sooner if needed.  I have spent atleast 15 minutes non face to face with patient today. More than 50 % of the time was spent for psychoeducation and supportive psychotherapy and care coordination.  This Garcia was generated in part or whole with voice recognition software. Voice recognition is usually quite accurate but there are transcription errors that can and very often do occur. I apologize for any typographical errors that were not detected and corrected.     Ursula Alert, MD 11/01/2018, 1:12 PM

## 2018-11-01 NOTE — Progress Notes (Signed)
Attempted to contact patient - no response

## 2018-11-29 ENCOUNTER — Telehealth: Payer: Self-pay

## 2018-11-29 DIAGNOSIS — F5105 Insomnia due to other mental disorder: Secondary | ICD-10-CM

## 2018-11-29 DIAGNOSIS — F4310 Post-traumatic stress disorder, unspecified: Secondary | ICD-10-CM

## 2018-11-29 DIAGNOSIS — F411 Generalized anxiety disorder: Secondary | ICD-10-CM

## 2018-11-29 MED ORDER — QUETIAPINE FUMARATE 25 MG PO TABS: 25.0000 mg | ORAL_TABLET | Freq: Every day | ORAL | 1 refills | Status: DC

## 2018-11-29 MED ORDER — ESCITALOPRAM OXALATE 10 MG PO TABS: 10.0000 mg | ORAL_TABLET | Freq: Every day | ORAL | 1 refills | Status: DC

## 2018-11-29 NOTE — Telephone Encounter (Signed)
pt states he needs refills on his medications he does not have enought to get to his next appt

## 2018-11-29 NOTE — Telephone Encounter (Signed)
Sent lexapro and seroquel to pharmacy

## 2018-12-05 ENCOUNTER — Telehealth: Payer: Self-pay

## 2018-12-05 NOTE — Telephone Encounter (Signed)
pt called left message that he is having an issue with his lexapro and/or seroquel. states he is more depressed . that his depression is so much worse.

## 2018-12-05 NOTE — Telephone Encounter (Signed)
It is difficult to tell whether it is due to increase in lexapro or his depression is getting worse. However, if he wants to try reducing the dose of lexapro to 5 mg, we can try it to see if depression improves. Please also assess any safety risk, and discuss available emergency resources if needed.

## 2018-12-10 NOTE — Telephone Encounter (Signed)
Pt returned called . Pt was given instruction per dr. Modesta Messing pt is going to decrease to 5mg .

## 2018-12-18 ENCOUNTER — Other Ambulatory Visit: Payer: Self-pay

## 2018-12-18 ENCOUNTER — Encounter: Payer: Self-pay | Admitting: Psychiatry

## 2018-12-18 ENCOUNTER — Ambulatory Visit (INDEPENDENT_AMBULATORY_CARE_PROVIDER_SITE_OTHER): Payer: BC Managed Care – PPO | Admitting: Psychiatry

## 2018-12-18 DIAGNOSIS — F411 Generalized anxiety disorder: Secondary | ICD-10-CM | POA: Diagnosis not present

## 2018-12-18 DIAGNOSIS — F5105 Insomnia due to other mental disorder: Secondary | ICD-10-CM

## 2018-12-18 DIAGNOSIS — F4310 Post-traumatic stress disorder, unspecified: Secondary | ICD-10-CM | POA: Diagnosis not present

## 2018-12-18 MED ORDER — QUETIAPINE FUMARATE 25 MG PO TABS: 37.5000 mg | ORAL_TABLET | Freq: Every day | ORAL | 1 refills | Status: DC

## 2018-12-18 MED ORDER — ESCITALOPRAM OXALATE 10 MG PO TABS: 15.0000 mg | ORAL_TABLET | Freq: Every day | ORAL | 1 refills | Status: DC

## 2018-12-18 NOTE — Progress Notes (Signed)
Virtual Visit via Video Note  I connected with Sergio Garcia on 12/18/18 at  4:15 PM EDT by a video enabled telemedicine application and verified that I am speaking with the correct person using two identifiers.   I discussed the limitations of evaluation and management by telemedicine and the availability of in person appointments. The patient expressed understanding and agreed to proceed.   I discussed the assessment and treatment plan with the patient. The patient was provided an opportunity to ask questions and all were answered. The patient agreed with the plan and demonstrated an understanding of the instructions.   The patient was advised to call back or seek an in-person evaluation if the symptoms worsen or if the condition fails to improve as anticipated.   [place] MD OP Progress Note  12/18/2018 5:10 PM Sergio Garcia  MRN:  [id number]  Chief Complaint:  Chief Complaint    Follow-up     HPI: Sergio Garcia is a 43 year old Caucasian male, employed, lives in [place], has a history of PTSD, generalized anxiety disorder, insomnia, GERD, PVC, Gilbert's syndrome, elevated LFT was evaluated by telemedicine today.  Patient today reports he has noticed some progress with the Lexapro however has been having some increased anxiety symptoms recently.  He reports he cannot find any triggers.  He feels on edge and nervous often.  Patient denies any suicidality or homicidality.  He reports sleep is improved on Seroquel and is tolerating the medication well.  Patient reports the last time he met with his therapist was more than a month ago.  Patient reports he is motivated to call to make an appointment soon.  Discussed with patient about readjusting his medication dosage also discussed with him to restart psychotherapy sessions more frequently.  He agrees with plan.  Patient reports good social support system from his girlfriend. Visit Diagnosis:    ICD-10-CM   1. PTSD (post-traumatic stress  disorder)  F43.10 escitalopram (LEXAPRO) 10 MG tablet    QUEtiapine (SEROQUEL) 25 MG tablet  2. Generalized anxiety disorder  F41.1 escitalopram (LEXAPRO) 10 MG tablet    QUEtiapine (SEROQUEL) 25 MG tablet  3. Insomnia due to mental condition  F51.05 QUEtiapine (SEROQUEL) 25 MG tablet    Past Psychiatric History: I have reviewed past psychiatric history from my progress note on 09/03/2018.  Past trials of Wellbutrin, Zoloft, Effexor, Klonopin.  Past Medical History:  Past Medical History:  Diagnosis Date  . Anxiety   . Degenerative disc disease   . Depression   . Enteritis   . Gastritis   . GERD (gastroesophageal reflux disease)   . Gilbert's syndrome    hyperbilirubinemia  . IBS (irritable bowel syndrome)   . Inguinal hernia   . Insomnia   . Prostatitis 2008    Past Surgical History:  Procedure Laterality Date  . BACK SURGERY     2 ruptured discs  . CHOLECYSTECTOMY  01/31/14  . ESOPHAGOGASTRODUODENOSCOPY  09/2004   gastritis acute and duodenitis  . ESOPHAGOGASTRODUODENOSCOPY  01-14-14   Dr Allen Norris  . EXPLORATORY LAPAROTOMY  Jan 2015   Dr Burt Knack    Family Psychiatric History: I have reviewed family psychiatric history from my progress note on 09/03/2018.  Family History:  Family History  Problem Relation Age of Onset  . Colon polyps Mother   . Cancer Mother        sinus cavity, on XRT  . Heart disease Father   . Heart attack Father        x 2  .  Pancreatic cancer Maternal Grandfather   . Diabetes Other        strong FH d both sides of family   . Heart Problems Paternal Grandfather   . Mental illness Neg Hx     Social History: I have reviewed social history from my progress note on 09/03/2018. Social History   Socioeconomic History  . Marital status: Legally Separated    Spouse name: Not on file  . Number of children: 2  . Years of education: Not on file  . Highest education level: High school graduate  Occupational History  . Occupation: Teacher, music: [employer]  . Financial resource strain: Not hard at all  . Food insecurity    Worry: Never true    Inability: Never true  . Transportation needs    Medical: No    Non-medical: No  Tobacco Use  . Smoking status: Never Smoker  . Smokeless tobacco: Never Used  Substance and Sexual Activity  . Alcohol use: Yes    Comment: occassional social  . Drug use: No  . Sexual activity: Yes  Lifestyle  . Physical activity    Days per week: 0 days    Minutes per session: 0 min  . Stress: Very much  Relationships  . Social Herbalist on phone: Not on file    Gets together: Not on file    Attends religious service: 1 to 4 times per year    Active member of club or organization: No    Attends meetings of clubs or organizations: Never    Relationship status: Separated  Other Topics Concern  . Not on file  Social History Narrative   Married.   Lives in [place]    2 children.   Enjoys watching racing, Dealer, deer hunting.              Allergies:  Allergies  Allergen Reactions  . Morphine And Related Anaphylaxis  . Morphine Nausea And Vomiting  . Tdap [Tetanus-Diphth-Acell Pertussis] Hives and Rash    Metabolic Disorder Labs: Lab Results  Component Value Date   HGBA1C 5.9 03/02/2018   No results found for: PROLACTIN Lab Results  Component Value Date   CHOL 178 03/02/2018   TRIG 81.0 03/02/2018   HDL 41.30 03/02/2018   CHOLHDL 4 03/02/2018   VLDL 16.2 03/02/2018   LDLCALC 120 (H) 03/02/2018   LDLCALC 85 06/08/2015   Lab Results  Component Value Date   TSH 1.88 03/02/2018   TSH 1.73 06/08/2015    Therapeutic Level Labs: No results found for: LITHIUM No results found for: VALPROATE No components found for:  CBMZ  Current Medications: Current Outpatient Medications  Medication Sig Dispense Refill  . cetirizine-pseudoephedrine (ZYRTEC-D) 5-120 MG tablet Take 1 tablet by mouth 2  (two) times daily. 30 tablet 0  . cyclobenzaprine (FLEXERIL) 5 MG tablet Take 1 tablet (5 mg total) by mouth 3 (three) times daily as needed for muscle spasms. 15 tablet 0  . escitalopram (LEXAPRO) 10 MG tablet Take 1.5 tablets (15 mg total) by mouth daily. 45 tablet 1  . ketorolac (TORADOL) 10 MG tablet Take 1 tablet (10 mg total) by mouth every 8 (eight) hours. 15 tablet 0  . methylPREDNISolone (MEDROL DOSEPAK) 4 MG TBPK tablet Follow package directions.    . pantoprazole (PROTONIX) 40 MG tablet Take 40 mg by mouth daily.    . QUEtiapine (  SEROQUEL) 25 MG tablet Take 1.5 tablets (37.5 mg total) by mouth at bedtime. 45 tablet 1   No current facility-administered medications for this visit.      Musculoskeletal: Strength & Muscle Tone: UTA Gait & Station: normal Patient leans: N/A  Psychiatric Specialty Exam: Review of Systems  Psychiatric/Behavioral: The patient is nervous/anxious.   All other systems reviewed and are negative.   There were no vitals taken for this visit.There is no height or weight on file to calculate BMI.  General Appearance: Casual  Eye Contact:  Fair  Speech:  Clear and Coherent  Volume:  Normal  Mood:  Anxious  Affect:  Congruent  Thought Process:  Goal Directed and Descriptions of Associations: Intact  Orientation:  Full (Time, Place, and Person)  Thought Content: Logical   Suicidal Thoughts:  No  Homicidal Thoughts:  No  Memory:  Immediate;   Fair Recent;   Fair Remote;   Fair  Judgement:  Fair  Insight:  Fair  Psychomotor Activity:  Normal  Concentration:  Concentration: Fair and Attention Span: Fair  Recall:  [employer] of Knowledge: Fair  Language: Fair  Akathisia:  No  Handed:  Right  AIMS (if indicated): Denies tremors, rigidity  Assets:  Communication Skills Desire for Improvement Housing Social Support  ADL's:  Intact  Cognition: WNL  Sleep:  Fair   Screenings: GAD-7     Office Visit from 01/24/2018 in [facility] from 10/24/2016 in [place] at [facility]  Total GAD-7 Score  14  14    PHQ2-9     Office Visit from 01/24/2018 in [facility] from 05/10/2016 in [place] at [facility] from 11/25/2015 in [facility] Visit from 10/30/2015 in [place]  PHQ-2 Total Score  6  0  0  0  PHQ-9 Total Score  19  -  -  -       Assessment and Plan: Sergio Garcia is 43 year old Caucasian male, has a history of GAD, PTSD, IBS, PVC, Gilbert's syndrome was evaluated by telemedicine today.  Patient is biologically predisposed given his history of trauma.  Patient also has psychosocial stressors of relationship trouble, legal problems.  Patient continues to struggle with anxiety and will benefit from medication readjustment.  Plan For PTSD- improving Lexapro as prescribed Continue CBT with Ms. Cecilie Lowers.  For GAD-unstable Increase Lexapro to 15 mg p.o. daily Increase Seroquel to 37.5 mg p.o. nightly Continue CBT.  Patient advised to call Ms. Cecilie Lowers to make a sooner appointment.  For insomnia-improving Seroquel as prescribed  Follow-up in clinic in 1 month or sooner if needed.  September 2 at 4:45 PM  I have spent atleast 15 minutes non face to face with patient today. More than 50 % of the time was spent for psychoeducation and supportive psychotherapy and care coordination.  This note was generated in part or whole with voice recognition software. Voice recognition is usually quite accurate but there are transcription errors that can and very often do occur. I apologize for any typographical errors that were not detected and corrected.        Ursula Alert, MD 12/18/2018, 5:10 PM

## 2019-01-01 ENCOUNTER — Telehealth: Payer: Self-pay | Admitting: Psychiatry

## 2019-01-01 NOTE — Telephone Encounter (Signed)
Spoke to patient regarding his FMLA.  Patient reports he is currently dealing with some stressors and was advised to take intermittent Leave.  Will advise patient to take intermittent leave.  Completed the form.  Janett Billow CMA to Fax it to employer.

## 2019-01-02 ENCOUNTER — Telehealth: Payer: Self-pay

## 2019-01-02 NOTE — Telephone Encounter (Signed)
pt states that the medication lexapro and seroquel is still not working he states he still having anxiety and depression has seen no difference.

## 2019-01-02 NOTE — Telephone Encounter (Signed)
Jess please put him on my schedule the earliest to be seen and pls let him know he needs to be seen.thanks

## 2019-01-03 NOTE — Telephone Encounter (Signed)
Please add him to waiting list.Pls give him what is available.

## 2019-01-03 NOTE — Telephone Encounter (Signed)
This was already faxed and confirmed and a copy mailed to patient.

## 2019-01-03 NOTE — Telephone Encounter (Signed)
01-23-19 is the next available appt. Unless lea can add him to waiting list.

## 2019-01-04 ENCOUNTER — Telehealth: Payer: Self-pay

## 2019-01-04 NOTE — Telephone Encounter (Signed)
Thanks

## 2019-01-04 NOTE — Telephone Encounter (Signed)
Patient called regarding the status of his medication change from encounter on 01/02/19 that stated the lexapro and seroquel are not working. I relayed the message to the patient from that encounter that he needs to be seen and informed him of his appointment date and time and that he is on the waiting list for an earlier appointment with doctor. I also informed him of his appointment with Alden Hipp on 01/21/19. Patient was agreeable. Thank you.

## 2019-01-10 NOTE — Telephone Encounter (Signed)
Can you add pt to the wait list per dr. Shea Evans

## 2019-01-21 ENCOUNTER — Ambulatory Visit (INDEPENDENT_AMBULATORY_CARE_PROVIDER_SITE_OTHER): Payer: BC Managed Care – PPO | Admitting: Licensed Clinical Social Worker

## 2019-01-21 ENCOUNTER — Encounter: Payer: Self-pay | Admitting: Licensed Clinical Social Worker

## 2019-01-21 ENCOUNTER — Other Ambulatory Visit: Payer: Self-pay

## 2019-01-21 DIAGNOSIS — F4310 Post-traumatic stress disorder, unspecified: Secondary | ICD-10-CM | POA: Diagnosis not present

## 2019-01-21 DIAGNOSIS — F411 Generalized anxiety disorder: Secondary | ICD-10-CM

## 2019-01-21 NOTE — Progress Notes (Signed)
Virtual Visit via Video Note  I connected with Sergio Garcia on 01/21/19 at 10:00 AM EDT by a video enabled telemedicine application and verified that I am speaking with the correct person using two identifiers.   I discussed the limitations of evaluation and management by telemedicine and the availability of in person appointments. The patient expressed understanding and agreed to proceed.  I discussed the assessment and treatment plan with the patient. The patient was provided an opportunity to ask questions and all were answered. The patient agreed with the plan and demonstrated an understanding of the instructions.   The patient was advised to call back or seek an in-person evaluation if the symptoms worsen or if the condition fails to improve as anticipated.  I provided 30 minutes of non-face-to-face time during this encounter.   Sergio Hipp, LCSW    THERAPIST PROGRESS NOTE  Session Time: 1000  Participation Level: Active  Behavioral Response: CasualAlertAnxious  Type of Therapy: Individual Therapy  Treatment Goals addressed: Anxiety  Interventions: CBT  Summary: Sergio Garcia is a 43 y.o. male who presents with continued symptoms related to his diagnosis. Sergio Garcia reports doing, "okay," since our last session. He reports some aspects of life have been going well, but he is struggling in others. He reports his depression and anxiety have gotten worse over the last month, and he has since been placed on FMLA from work. He was unable to identify the cause of his increase in symptoms, and could only guess that it could be related to his relationship. He stated it's going well, but at times he has anxiety around his girlfriend and if she is leaving, or cheating on him. LCSW normalized these feelings, and pointed out the connection between his last relationship and his anxious feelings. LCSW shared several resources on how to challenge negative thoughts, and we reviewed those resources  together. LCSW asked Sergio Garcia to start utilizing CBT skills to manage his anxious thoughts in the moment. Additionally, Sergio Garcia was asked to have a conversation with his girlfriend to communicate how he is feeling in a calm, respectful manner--but also explain the way his past is having an impact on his present. Sergio Garcia expressed understanding and agreement with the ideas presented and stated he felt comfortable discussing this with his girlfriend. Sergio Garcia reports the only other stressor in his life are his son's medical problems. We discussed how Sergio Garcia could utilize CBT to manage those thoughts as well.   Suicidal/Homicidal: No  Therapist Response: Sergio Garcia continues to work towards his tx goals. Sergio Garcia reports an increase in anxiety and depression since our last session. We will begin to work on sharpening CBT skills moving forward to assist with anxiety and depression.   Plan: Return again in 3 weeks.  Diagnosis: Axis I: Post Traumatic Stress Disorder    Axis II: No diagnosis    Sergio Hipp, LCSW 01/21/2019

## 2019-01-22 ENCOUNTER — Other Ambulatory Visit: Payer: Self-pay

## 2019-01-23 ENCOUNTER — Encounter: Payer: Self-pay | Admitting: Internal Medicine

## 2019-01-23 ENCOUNTER — Ambulatory Visit: Payer: BC Managed Care – PPO | Admitting: Internal Medicine

## 2019-01-23 ENCOUNTER — Encounter: Payer: Self-pay | Admitting: Psychiatry

## 2019-01-23 ENCOUNTER — Ambulatory Visit (INDEPENDENT_AMBULATORY_CARE_PROVIDER_SITE_OTHER): Payer: BC Managed Care – PPO | Admitting: Psychiatry

## 2019-01-23 ENCOUNTER — Other Ambulatory Visit: Payer: Self-pay

## 2019-01-23 VITALS — BP 132/78 | HR 78 | Temp 98.6°F | Ht 74.0 in | Wt 190.0 lb

## 2019-01-23 DIAGNOSIS — T148XXA Other injury of unspecified body region, initial encounter: Secondary | ICD-10-CM

## 2019-01-23 DIAGNOSIS — R531 Weakness: Secondary | ICD-10-CM

## 2019-01-23 DIAGNOSIS — R269 Unspecified abnormalities of gait and mobility: Secondary | ICD-10-CM | POA: Diagnosis not present

## 2019-01-23 DIAGNOSIS — R51 Headache: Secondary | ICD-10-CM

## 2019-01-23 DIAGNOSIS — F4310 Post-traumatic stress disorder, unspecified: Secondary | ICD-10-CM

## 2019-01-23 DIAGNOSIS — Z125 Encounter for screening for malignant neoplasm of prostate: Secondary | ICD-10-CM

## 2019-01-23 DIAGNOSIS — G43001 Migraine without aura, not intractable, with status migrainosus: Secondary | ICD-10-CM

## 2019-01-23 DIAGNOSIS — R339 Retention of urine, unspecified: Secondary | ICD-10-CM

## 2019-01-23 DIAGNOSIS — F5105 Insomnia due to other mental disorder: Secondary | ICD-10-CM | POA: Insufficient documentation

## 2019-01-23 DIAGNOSIS — Z23 Encounter for immunization: Secondary | ICD-10-CM | POA: Diagnosis not present

## 2019-01-23 DIAGNOSIS — R350 Frequency of micturition: Secondary | ICD-10-CM

## 2019-01-23 DIAGNOSIS — R7303 Prediabetes: Secondary | ICD-10-CM

## 2019-01-23 DIAGNOSIS — K580 Irritable bowel syndrome with diarrhea: Secondary | ICD-10-CM

## 2019-01-23 DIAGNOSIS — E785 Hyperlipidemia, unspecified: Secondary | ICD-10-CM

## 2019-01-23 DIAGNOSIS — F419 Anxiety disorder, unspecified: Secondary | ICD-10-CM

## 2019-01-23 DIAGNOSIS — R519 Headache, unspecified: Secondary | ICD-10-CM

## 2019-01-23 DIAGNOSIS — R634 Abnormal weight loss: Secondary | ICD-10-CM | POA: Diagnosis not present

## 2019-01-23 DIAGNOSIS — F411 Generalized anxiety disorder: Secondary | ICD-10-CM

## 2019-01-23 DIAGNOSIS — F329 Major depressive disorder, single episode, unspecified: Secondary | ICD-10-CM

## 2019-01-23 DIAGNOSIS — F32A Depression, unspecified: Secondary | ICD-10-CM

## 2019-01-23 DIAGNOSIS — R5383 Other fatigue: Secondary | ICD-10-CM

## 2019-01-23 DIAGNOSIS — R42 Dizziness and giddiness: Secondary | ICD-10-CM

## 2019-01-23 DIAGNOSIS — E538 Deficiency of other specified B group vitamins: Secondary | ICD-10-CM

## 2019-01-23 DIAGNOSIS — R3911 Hesitancy of micturition: Secondary | ICD-10-CM

## 2019-01-23 MED ORDER — SUMATRIPTAN SUCCINATE 25 MG PO TABS: 25.0000 mg | ORAL_TABLET | ORAL | 0 refills | Status: DC | PRN

## 2019-01-23 MED ORDER — QUETIAPINE FUMARATE 25 MG PO TABS: 37.5000 mg | ORAL_TABLET | Freq: Every day | ORAL | 1 refills | Status: DC

## 2019-01-23 MED ORDER — MUPIROCIN 2 % EX OINT: 1.0000 "application " | TOPICAL_OINTMENT | Freq: Two times a day (BID) | CUTANEOUS | 0 refills | Status: DC

## 2019-01-23 MED ORDER — FLUOXETINE HCL 20 MG PO CAPS: 40.0000 mg | ORAL_CAPSULE | Freq: Every day | ORAL | 1 refills | Status: DC

## 2019-01-23 MED ORDER — ONDANSETRON HCL 4 MG PO TABS: 4.0000 mg | ORAL_TABLET | Freq: Two times a day (BID) | ORAL | 12 refills | Status: DC | PRN

## 2019-01-23 NOTE — Progress Notes (Signed)
Pre visit review using our clinic review tool, if applicable. No additional management support is needed unless otherwise documented below in the visit note. 

## 2019-01-23 NOTE — Progress Notes (Signed)
Virtual Visit via Video Note  I connected with Sergio Garcia on 01/23/19 at  4:45 PM EDT by a video enabled telemedicine application and verified that I am speaking with the correct person using two identifiers.   I discussed the limitations of evaluation and management by telemedicine and the availability of in person appointments. The patient expressed understanding and agreed to proceed.   I discussed the assessment and treatment plan with the patient. The patient was provided an opportunity to ask questions and all were answered. The patient agreed with the plan and demonstrated an understanding of the instructions.   The patient was advised to call back or seek an in-person evaluation if the symptoms worsen or if the condition fails to improve as anticipated.   [place] MD OP Progress Note  01/23/2019 5:12 PM Sergio Garcia  MRN:  [id number]  Chief Complaint:  Chief Complaint    Follow-up     HPI: Sergio Garcia is a 43 year old Caucasian male, employed, lives in [place], has a history of PTSD, generalized anxiety disorder, insomnia, GERD, PVC, Gilbert's syndrome, elevated LFT was evaluated by telemedicine today.  Patient today reports he does not like the effect of Lexapro.  He reports he continues to feel anxious and depressed and does not think the Lexapro is helpful.  He also reports he has noticed some side effects like inability to urinate on and off.  He reports when he starts to urinate he has trouble with it and this has been going on since the past 1 month or so.  He reports he had a discussion with his primary care medical doctor today and he is going to be evaluated for prostate hypertrophy and other problems.  Patient reports the Seroquel does help with sleep.  He however does feel groggy when he wakes up in the morning.  He denies any side effects to the Seroquel.  Patient wonders whether he can be started on Zoloft since his girlfriend is doing well on that medication.  Per review  of medication list, discussed with patient that he was already tried on Zoloft in the past.  Discussed starting Prozac.  He agrees with plan.  Patient denies any suicidality, homicidality or perceptual disturbances.  Patient reports he is currently struggling with headaches and his primary care provider has prescribed him a new medication.  Discussed with him that medications like Imitrex can have an interaction with his antidepressants.  Discussed serotonin syndrome.  He will monitor himself closely.     Visit Diagnosis:    ICD-10-CM   1. PTSD (post-traumatic stress disorder)  F43.10 FLUoxetine (PROZAC) 20 MG capsule    QUEtiapine (SEROQUEL) 25 MG tablet  2. Generalized anxiety disorder  F41.1 FLUoxetine (PROZAC) 20 MG capsule    QUEtiapine (SEROQUEL) 25 MG tablet  3. Insomnia due to mental condition  F51.05 QUEtiapine (SEROQUEL) 25 MG tablet    Past Psychiatric History: I have reviewed past psychiatric history from my progress note on 09/20/2018.  Past trials of Wellbutrin, Zoloft, Effexor, Klonopin  Past Medical History:  Past Medical History:  Diagnosis Date  . Anxiety   . Degenerative disc disease   . Depression   . Enteritis   . Gastritis   . GERD (gastroesophageal reflux disease)   . Gilbert's syndrome    hyperbilirubinemia  . IBS (irritable bowel syndrome)   . Inguinal hernia   . Insomnia   . Prostatitis 2008    Past Surgical History:  Procedure Laterality Date  . BACK SURGERY  2 ruptured discs  . CHOLECYSTECTOMY  01/31/14  . ESOPHAGOGASTRODUODENOSCOPY  09/2004   gastritis acute and duodenitis  . ESOPHAGOGASTRODUODENOSCOPY  01-14-14   Dr Allen Norris  . EXPLORATORY LAPAROTOMY  Jan 2015   Dr Burt Knack    Family Psychiatric History: I have reviewed family psychiatric history from my progress note on 09/03/2018 Family History:  Family History  Problem Relation Age of Onset  . Colon polyps Mother   . Cancer Mother        sinus cavity, on XRT  . Heart disease Father    . Heart attack Father        x 2  . Pancreatic cancer Maternal Grandfather   . Colon cancer Maternal Grandfather   . Diabetes Other        strong FH d both sides of family   . Heart Problems Paternal Grandfather   . Cancer Maternal Uncle   . Mental illness Neg Hx     Social History: I have reviewed social history from my progress note on 09/03/2018 Social History   Socioeconomic History  . Marital status: Legally Separated    Spouse name: Not on file  . Number of children: 2  . Years of education: Not on file  . Highest education level: High school graduate  Occupational History  . Occupation: Music therapist: [employer]  . Financial resource strain: Not hard at all  . Food insecurity    Worry: Never true    Inability: Never true  . Transportation needs    Medical: No    Non-medical: No  Tobacco Use  . Smoking status: Never Smoker  . Smokeless tobacco: Never Used  Substance and Sexual Activity  . Alcohol use: Yes    Comment: occassional social  . Drug use: No  . Sexual activity: Yes  Lifestyle  . Physical activity    Days per week: 0 days    Minutes per session: 0 min  . Stress: Very much  Relationships  . Social Herbalist on phone: Not on file    Gets together: Not on file    Attends religious service: 1 to 4 times per year    Active member of club or organization: No    Attends meetings of clubs or organizations: Never    Relationship status: Separated  Other Topics Concern  . Not on file  Social History Narrative   Married.   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   Enjoys watching racing, Dealer, deer hunting.              Allergies:  Allergies  Allergen Reactions  . Morphine And Related Anaphylaxis  . Morphine Nausea And Vomiting  . Tdap [Tetanus-Diphth-Acell Pertussis] Hives and Rash    Metabolic Disorder Labs: Lab Results  Component Value Date   HGBA1C 5.9  03/02/2018   No results found for: PROLACTIN Lab Results  Component Value Date   CHOL 178 03/02/2018   TRIG 81.0 03/02/2018   HDL 41.30 03/02/2018   CHOLHDL 4 03/02/2018   VLDL 16.2 03/02/2018   LDLCALC 120 (H) 03/02/2018   LDLCALC 85 06/08/2015   Lab Results  Component Value Date   TSH 1.88 03/02/2018   TSH 1.73 06/08/2015    Therapeutic Level Labs: No results found for: LITHIUM No results found for: VALPROATE No components found for:  CBMZ  Current Medications: Current Outpatient Medications  Medication Sig Dispense Refill  . cetirizine-pseudoephedrine (ZYRTEC-D) 5-120 MG tablet Take 1 tablet by mouth 2 (two) times daily. 30 tablet 0  . cyclobenzaprine (FLEXERIL) 5 MG tablet Take 1 tablet (5 mg total) by mouth 3 (three) times daily as needed for muscle spasms. 15 tablet 0  . FLUoxetine (PROZAC) 20 MG capsule Take 2 capsules (40 mg total) by mouth daily. Start 20 mg for 3 weeks and increase to 40 mg ( 2 capsules) 60 capsule 1  . mupirocin ointment (BACTROBAN) 2 % Apply 1 application topically 2 (two) times daily. B/l hands 30 g 0  . ondansetron (ZOFRAN) 4 MG tablet Take 1 tablet (4 mg total) by mouth 2 (two) times daily as needed for nausea or vomiting. 60 tablet 12  . pantoprazole (PROTONIX) 40 MG tablet Take 40 mg by mouth daily.    . QUEtiapine (SEROQUEL) 25 MG tablet Take 1.5 tablets (37.5 mg total) by mouth at bedtime. 45 tablet 1  . SUMAtriptan (IMITREX) 25 MG tablet Take 1 tablet (25 mg total) by mouth as needed for migraine. May repeat in 2 hours if headache persists or recurs. No more than 2x per week. No more than 200 mg total/24 hours 9 tablet 0   No current facility-administered medications for this visit.      Musculoskeletal: Strength & Muscle Tone: UTA Gait & Station: normal Patient leans: N/A  Psychiatric Specialty Exam: Review of Systems  Psychiatric/Behavioral: Positive for depression. The patient is nervous/anxious.   All other systems reviewed and  are negative.   There were no vitals taken for this visit.There is no height or weight on file to calculate BMI.  General Appearance: Casual  Eye Contact:  Fair  Speech:  Clear and Coherent  Volume:  Normal  Mood:  Anxious and Depressed  Affect:  Congruent  Thought Process:  Goal Directed and Descriptions of Associations: Intact  Orientation:  Full (Time, Place, and Person)  Thought Content: Logical   Suicidal Thoughts:  No  Homicidal Thoughts:  No  Memory:  Immediate;   Fair Recent;   Fair Remote;   Good  Judgement:  Fair  Insight:  Fair  Psychomotor Activity:  Normal  Concentration:  Concentration: Fair and Attention Span: Fair  Recall:  [employer] of Knowledge: Fair  Language: Fair  Akathisia:  No  Handed:  Right  AIMS (if indicated):Denies tremors, rigidity  Assets:  Communication Skills Desire for Improvement Social Support  ADL's:  Intact  Cognition: WNL  Sleep:  Good   Screenings: GAD-7     Office Visit from 01/24/2018 in [facility] Visit from 10/24/2016 in [facility] at [facility]  Total GAD-7 Score  14  14    PHQ2-9     Office Visit from 01/24/2018 in [facility] from 05/10/2016 in [facility] at [facility] from 11/25/2015 in [facility] Office Visit from 10/30/2015 in [facility]  PHQ-2 Total Score  6  0  0  0  PHQ-9 Total Score  19  -  -  -       Assessment and Plan: [place] is a 43 year old Caucasian male, has a history of GAD, PTSD, IBS, PVC, Gilbert's syndrome, was evaluated by telemedicine today.  Patient is biologically predisposed given his history of trauma.  He also has psychosocial stressors of relationship trouble, legal problems.  Patient patient is also currently struggling with health problems like headache urinary retention.  He is currently working with his PMD on the same.  Patient however will benefit from medication readjustment for  his mood.  Plan PTSD-unstable Discontinue Lexapro. Start Prozac 20 mg p.o. daily for 3 weeks and increase to 40 mg after that. Continue CBT with Ms. Cecilie Lowers  GAD-unstable Discontinue Lexapro and start Prozac. Seroquel 37.5 mg p.o. nightly Continue CBT  Insomnia- stable Seroquel as prescribed   Discussed with patient talk to drug interaction between Imitrex as well as his antidepressants including serotonin syndrome..  Patient will monitor himself closely and go to the nearest emergency department.  I have spent atleast 15 minutes non face to face with patient today. More than 50 % of the time was spent for psychoeducation and supportive psychotherapy and care coordination. This note was generated in part or whole with voice recognition software. Voice recognition is usually quite accurate but there are transcription errors that can and very often do occur. I apologize for any typographical errors that were not detected and corrected.       Ursula Alert, MD 01/23/2019, 5:12 PM
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
## 2019-01-23 NOTE — Patient Instructions (Addendum)
Do regular zyrtec not D/sudafed or pseudoephedrine   Magnesium 250 mg daily   Tension Headache, Adult A tension headache is pain, pressure, or aching in your head. Tension headaches can last from 30 minutes to several days. Follow these instructions at home: Managing pain  Take over-the-counter and prescription medicines only as told by your doctor.  When you have a headache, lie down in a dark, quiet room.  If told, put ice on your head and neck: ? Put ice in a plastic bag. ? Place a towel between your skin and the bag. ? Leave the ice on for 20 minutes, 2-3 times a day.  If told, put heat on the back of your neck. Do this as often as your doctor tells you to. Use the kind of heat that your doctor recommends, such as a moist heat pack or a heating pad. ? Place a towel between your skin and the heat. ? Leave the heat on for 20-30 minutes. ? Remove the heat if your skin turns bright red. Eating and drinking  Eat meals on a regular schedule.  Watch how much alcohol you drink: ? If you are a woman and are not pregnant, do not drink more than 1 drink a day. ? If you are a man, do not drink more than 2 drinks a day.  Drink enough fluid to keep your pee (urine) pale yellow.  Do not use a lot of caffeine, or stop using caffeine. Lifestyle  Get enough sleep. Get 7-9 hours of sleep each night. Or get the amount of sleep that your doctor tells you to.  At bedtime, remove all electronic devices from your room. Examples of electronic devices are computers, phones, and tablets.  Find ways to lessen your stress. Some things that can lessen stress are: ? Exercise. ? Deep breathing. ? Yoga. ? Music. ? Positive thoughts.  Sit up straight. Do not tighten (tense) your muscles.  Do not use any products that have nicotine or tobacco in them, such as cigarettes and e-cigarettes. If you need help quitting, ask your doctor. General instructions   Keep all follow-up visits as told by your  doctor. This is important.  Avoid things that can bring on headaches. Keep a journal to find out if certain things bring on headaches. For example, write down: ? What you eat and drink. ? How much sleep you get. ? Any change to your diet or medicines. Contact a doctor if:  Your headache does not get better.  Your headache comes back.  You have a headache and sounds, light, or smells bother you.  You feel sick to your stomach (nauseous) or you throw up (vomit).  Your stomach hurts. Get help right away if:  You suddenly get a very bad headache along with any of these: ? A stiff neck. ? Feeling sick to your stomach. ? Throwing up. ? Feeling weak. ? Trouble seeing. ? Feeling short of breath. ? A rash. ? Feeling unusually sleepy. ? Trouble speaking. ? Pain in your eye or ear. ? Trouble walking or balancing. ? Feeling like you will pass out (faint). ? Passing out. Summary  A tension headache is pain, pressure, or aching in your head.  Tension headaches can last from 30 minutes to several days.  Lifestyle changes and medicines may help relieve pain. This information is not intended to replace advice given to you by your health care provider. Make sure you discuss any questions you have with your health care provider.  Document Released: 08/03/2009 Document Revised: 04/21/2017 Document Reviewed: 08/19/2016 Elsevier Patient Education  [place].  Migraine Headache A migraine headache is a very strong throbbing pain on one side or both sides of your head. This type of headache can also cause other symptoms. It can last from 4 hours to 3 days. Talk with your doctor about what things may bring on (trigger) this condition. What are the causes? The exact cause of this condition is not known. This condition may be triggered or caused by:  Drinking alcohol.  Smoking.  Taking medicines, such as: ? Medicine used to treat chest pain (nitroglycerin). ? Birth control  pills. ? Estrogen. ? Some blood pressure medicines.  Eating or drinking certain products.  Doing physical activity. Other things that may trigger a migraine headache include:  Having a menstrual period.  Pregnancy.  Hunger.  Stress.  Not getting enough sleep or getting too much sleep.  Weather changes.  Tiredness (fatigue). What increases the risk?  Being 50-110 years old.  Being male.  Having a family history of migraine headaches.  Being Caucasian.  Having depression or anxiety.  Being very overweight. What are the signs or symptoms?  A throbbing pain. This pain may: ? Happen in any area of the head, such as on one side or both sides. ? Make it hard to do daily activities. ? Get worse with physical activity. ? Get worse around bright lights or loud noises.  Other symptoms may include: ? Feeling sick to your stomach (nauseous). ? Vomiting. ? Dizziness. ? Being sensitive to bright lights, loud noises, or smells.  Before you get a migraine headache, you may get warning signs (an aura). An aura may include: ? Seeing flashing lights or having blind spots. ? Seeing bright spots, halos, or zigzag lines. ? Having tunnel vision or blurred vision. ? Having numbness or a tingling feeling. ? Having trouble talking. ? Having weak muscles.  Some people have symptoms after a migraine headache (postdromal phase), such as: ? Tiredness. ? Trouble thinking (concentrating). How is this treated?  Taking medicines that: ? Relieve pain. ? Relieve the feeling of being sick to your stomach. ? Prevent migraine headaches.  Treatment may also include: ? Having acupuncture. ? Avoiding foods that bring on migraine headaches. ? Learning ways to control your body functions (biofeedback). ? Therapy to help you know and deal with negative thoughts (cognitive behavioral therapy). Follow these instructions at home: Medicines  Take over-the-counter and prescription medicines  only as told by your doctor.  Ask your doctor if the medicine prescribed to you: ? Requires you to avoid driving or using heavy machinery. ? Can cause trouble pooping (constipation). You may need to take these steps to prevent or treat trouble pooping:  Drink enough fluid to keep your pee (urine) pale yellow.  Take over-the-counter or prescription medicines.  Eat foods that are high in fiber. These include beans, whole grains, and fresh fruits and vegetables.  Limit foods that are high in fat and sugar. These include fried or sweet foods. Lifestyle  Do not drink alcohol.  Do not use any products that contain nicotine or tobacco, such as cigarettes, e-cigarettes, and chewing tobacco. If you need help quitting, ask your doctor.  Get at least 8 hours of sleep every night.  Limit and deal with stress. General instructions      Keep a journal to find out what may bring on your migraine headaches. For example, write down: ? What you eat and  drink. ? How much sleep you get. ? Any change in what you eat or drink. ? Any change in your medicines.  If you have a migraine headache: ? Avoid things that make your symptoms worse, such as bright lights. ? It may help to lie down in a dark, quiet room. ? Do not drive or use heavy machinery. ? Ask your doctor what activities are safe for you.  Keep all follow-up visits as told by your doctor. This is important. Contact a doctor if:  You get a migraine headache that is different or worse than others you have had.  You have more than 15 headache days in one month. Get help right away if:  Your migraine headache gets very bad.  Your migraine headache lasts longer than 72 hours.  You have a fever.  You have a stiff neck.  You have trouble seeing.  Your muscles feel weak or like you cannot control them.  You start to lose your balance a lot.  You start to have trouble walking.  You pass out (faint).  You have a  seizure. Summary  A migraine headache is a very strong throbbing pain on one side or both sides of your head. These headaches can also cause other symptoms.  This condition may be treated with medicines and changes to your lifestyle.  Keep a journal to find out what may bring on your migraine headaches.  Contact a doctor if you get a migraine headache that is different or worse than others you have had.  Contact your doctor if you have more than 15 headache days in a month. This information is not intended to replace advice given to you by your health care provider. Make sure you discuss any questions you have with your health care provider. Document Released: 02/16/2008 Document Revised: 08/31/2018 Document Reviewed: 06/21/2018 Elsevier Patient Education  2020 [employer].

## 2019-01-27 ENCOUNTER — Ambulatory Visit: Payer: BC Managed Care – PPO

## 2019-01-29 ENCOUNTER — Ambulatory Visit
Admission: RE | Admit: 2019-01-29 | Discharge: 2019-01-29 | Disposition: A | Discharge status: Home / Self Care | Payer: BC Managed Care – PPO | Source: Ambulatory Visit | Attending: Internal Medicine | Admitting: Internal Medicine

## 2019-01-29 ENCOUNTER — Other Ambulatory Visit: Payer: Self-pay

## 2019-01-29 ENCOUNTER — Encounter: Payer: Self-pay | Admitting: Internal Medicine

## 2019-01-29 DIAGNOSIS — R519 Headache, unspecified: Secondary | ICD-10-CM

## 2019-01-29 DIAGNOSIS — R51 Headache: Secondary | ICD-10-CM | POA: Diagnosis present

## 2019-01-29 DIAGNOSIS — R7303 Prediabetes: Secondary | ICD-10-CM | POA: Insufficient documentation

## 2019-01-29 LAB — CBC WITH DIFFERENTIAL/PLATELET
Basophils Absolute: 0 10*3/uL (ref 0.0–0.2)
Basos: 0 %
EOS (ABSOLUTE): 0.1 10*3/uL (ref 0.0–0.4)
Eos: 2 %
Hematocrit: 48.7 % (ref 37.5–51.0)
Hemoglobin: 16.4 g/dL (ref 13.0–17.7)
Immature Grans (Abs): 0 10*3/uL (ref 0.0–0.1)
Immature Granulocytes: 0 %
Lymphocytes Absolute: 1.6 10*3/uL (ref 0.7–3.1)
Lymphs: 27 %
MCH: 30.5 pg (ref 26.6–33.0)
MCHC: 33.7 g/dL (ref 31.5–35.7)
MCV: 91 fL (ref 79–97)
Monocytes Absolute: 0.4 10*3/uL (ref 0.1–0.9)
Monocytes: 7 %
Neutrophils Absolute: 3.7 10*3/uL (ref 1.4–7.0)
Neutrophils: 64 %
Platelets: 244 10*3/uL (ref 150–450)
RBC: 5.38 x10E6/uL (ref 4.14–5.80)
RDW: 12.3 % (ref 11.6–15.4)
WBC: 5.9 10*3/uL (ref 3.4–10.8)

## 2019-01-29 LAB — COMPREHENSIVE METABOLIC PANEL
ALT: 31 IU/L (ref 0–44)
AST: 24 IU/L (ref 0–40)
Albumin/Globulin Ratio: 2 (ref 1.2–2.2)
Albumin: 4.7 g/dL (ref 4.0–5.0)
Alkaline Phosphatase: 60 IU/L (ref 39–117)
BUN/Creatinine Ratio: 11 (ref 9–20)
BUN: 14 mg/dL (ref 6–24)
Bilirubin Total: 1.8 mg/dL — ABNORMAL HIGH (ref 0.0–1.2)
CO2: 23 mmol/L (ref 20–29)
Calcium: 9.5 mg/dL (ref 8.7–10.2)
Chloride: 104 mmol/L (ref 96–106)
Creatinine, Ser: 1.23 mg/dL (ref 0.76–1.27)
GFR calc Af Amer: 84 mL/min/{1.73_m2} (ref >59)
GFR calc non Af Amer: 72 mL/min/{1.73_m2} (ref >59)
Globulin, Total: 2.4 g/dL (ref 1.5–4.5)
Glucose: 148 mg/dL — ABNORMAL HIGH (ref 65–99)
Potassium: 4.3 mmol/L (ref 3.5–5.2)
Sodium: 142 mmol/L (ref 134–144)
Total Protein: 7.1 g/dL (ref 6.0–8.5)

## 2019-01-29 LAB — HIV ANTIBODY (ROUTINE TESTING W REFLEX): HIV Screen 4th Generation wRfx: NONREACTIVE

## 2019-01-29 LAB — VITAMIN B12: Vitamin B-12: 693 pg/mL (ref 232–1245)

## 2019-01-29 LAB — LIPID PANEL
Chol/HDL Ratio: 4 ratio (ref 0.0–5.0)
Cholesterol, Total: 173 mg/dL (ref 100–199)
HDL: 43 mg/dL (ref >39)
LDL Chol Calc (NIH): 108 mg/dL — ABNORMAL HIGH (ref 0–99)
Triglycerides: 125 mg/dL (ref 0–149)
VLDL Cholesterol Cal: 22 mg/dL (ref 5–40)

## 2019-01-29 LAB — TISSUE TRANSGLUTAMINASE, IGA: Transglutaminase IgA: <2 U/mL (ref 0–3)

## 2019-01-29 LAB — GLIADIN ANTIBODIES, SERUM
Antigliadin Abs, IgA: 3 units (ref 0–19)
Gliadin IgG: 3 units (ref 0–19)

## 2019-01-29 LAB — HEMOGLOBIN A1C
Est. average glucose Bld gHb Est-mCnc: 126 mg/dL
Hgb A1c MFr Bld: 6 % — ABNORMAL HIGH (ref 4.8–5.6)

## 2019-01-29 LAB — RETICULIN ANTIBODIES, IGA W TITER: Reticulin Ab, IgA: NEGATIVE titer (ref <2.5)

## 2019-01-29 LAB — TSH: TSH: 1.4 u[IU]/mL (ref 0.450–4.500)

## 2019-01-29 LAB — PSA: Prostate Specific Ag, Serum: 1.5 ng/mL (ref 0.0–4.0)

## 2019-01-29 MED ORDER — GADOBUTROL 1 MMOL/ML IV SOLN
8.0000 mL | Freq: Once | INTRAVENOUS | Status: AC | PRN
  Administered 2019-01-29: 8 mL via INTRAVENOUS

## 2019-01-30 ENCOUNTER — Telehealth: Payer: Self-pay | Admitting: Family

## 2019-01-30 DIAGNOSIS — R519 Headache, unspecified: Secondary | ICD-10-CM

## 2019-01-30 DIAGNOSIS — J341 Cyst and mucocele of nose and nasal sinus: Secondary | ICD-10-CM

## 2019-01-30 NOTE — Telephone Encounter (Signed)
Sarah, Call pt I am covering for Mclean today since she is on vacation I have placed referral to Dr Tami Ribas for sinus cyst seen on mri as also mentioned by Dr Derrel Nip ( I dont see referral in system yet). Advise pt that if he doesn't hear from [place] or ENT within a week with an appt scheduled to call our office and let us know.   I know he spoke with Mclean 01/23/19 in regards to HA. She mentioned consult with [facility] for headaches.   Would he also like to have a referral to neurology as well?   Let me know.   FYI mclean

## 2019-01-30 NOTE — Telephone Encounter (Signed)
Neurology referral placed to [facility]

## 2019-02-11 ENCOUNTER — Other Ambulatory Visit: Payer: Self-pay

## 2019-02-11 ENCOUNTER — Ambulatory Visit (INDEPENDENT_AMBULATORY_CARE_PROVIDER_SITE_OTHER): Payer: BC Managed Care – PPO | Admitting: Licensed Clinical Social Worker

## 2019-02-11 ENCOUNTER — Encounter: Payer: Self-pay | Admitting: Licensed Clinical Social Worker

## 2019-02-11 DIAGNOSIS — F4310 Post-traumatic stress disorder, unspecified: Secondary | ICD-10-CM | POA: Diagnosis not present

## 2019-02-11 NOTE — Progress Notes (Signed)
Virtual Visit via Video Note  I connected with Sergio Garcia on 02/11/19 at 10:00 AM EDT by a video enabled telemedicine application and verified that I am speaking with the correct person using two identifiers.   I discussed the limitations of evaluation and management by telemedicine and the availability of in person appointments. The patient expressed understanding and agreed to proceed.  I discussed the assessment and treatment plan with the patient. The patient was provided an opportunity to ask questions and all were answered. The patient agreed with the plan and demonstrated an understanding of the instructions.   The patient was advised to call back or seek an in-person evaluation if the symptoms worsen or if the condition fails to improve as anticipated.  I provided 50 minutes of non-face-to-face time during this encounter.   Sergio Garcia, Sergio Garcia    THERAPIST PROGRESS NOTE  Session Time: 1000  Participation Level: Active  Behavioral Response: CasualAlertAnxious  Type of Therapy: Individual Therapy  Treatment Goals addressed: Anxiety  Interventions: CBT  Summary: Sergio Garcia is a 43 y.o. male who presents with continued symptoms related to his diagnosis. Sergio Garcia reports doing well since our last session, but noted ongoing moments of anxiety around his relationship. He reported his girlfriend has a 32 year old son, with whom Sergio Garcia has grown very close. However, a coach from the girlfriend's church has been taking the son to parks and doing various outings with him. Sergio Garcia attempted to explain to his girlfriend that this makes him uncomfortable, especially coupled with the fact his girlfriend spends a lot of time texting the coach. Sergio Garcia stated his girlfriend becomes angry whenever he attempts to bring this up, and as soon as she raises her voice, "I completely shut down." Sergio Garcia noted he has stopped bringing up things or expressing his feelings because he is worried she will get  mad. Sergio Garcia validated Sergio Garcia's feelings and highlighted he should be able to appropriately express his feelings without worrying about her reaction. Further, she should want to make him feel more comfortable about the coach/son relationship instead of telling him he "needs to get over it." Sergio Garcia suggested Sergio Garcia attempt to speak with his girlfriend again, and recognize his own emotional baggage coming into the picture, as well as noting they could both work on improving their communication. Sergio Garcia also encouraged Sergio Garcia to share that he shuts down when she raises her voice. Sergio Garcia expressed understanding and agreement. We also discussed how to determine if these are qualities and situations he is comfortable dealing with in the future. Sergio Garcia encouraged Sergio Garcia to trust his gut, and if there is a reason he cannot trust her, then he should discuss that with her. Sergio Garcia expressed understanding and agreement with this idea.   Suicidal/Homicidal: No  Therapist Response: Sergio Garcia continues to work towards his tx goals but has not yet reached them. We will continue to work on improving communication and emotional regulation skills moving forward. We will also continue to work on improving CBT skills.   Plan: Return again in 4 weeks.  Diagnosis: Axis I: Post Traumatic Stress Disorder    Axis II: No diagnosis    Sergio Garcia, Sergio Garcia 02/11/2019

## 2019-02-12 ENCOUNTER — Encounter: Payer: Self-pay | Admitting: Urology

## 2019-02-12 ENCOUNTER — Ambulatory Visit: Payer: Self-pay | Admitting: Urology

## 2019-02-14 ENCOUNTER — Other Ambulatory Visit: Payer: Self-pay

## 2019-02-14 ENCOUNTER — Encounter: Payer: Self-pay | Admitting: Psychiatry

## 2019-02-14 ENCOUNTER — Ambulatory Visit (INDEPENDENT_AMBULATORY_CARE_PROVIDER_SITE_OTHER): Payer: BC Managed Care – PPO | Admitting: Psychiatry

## 2019-02-14 DIAGNOSIS — F5105 Insomnia due to other mental disorder: Secondary | ICD-10-CM

## 2019-02-14 DIAGNOSIS — F411 Generalized anxiety disorder: Secondary | ICD-10-CM | POA: Insufficient documentation

## 2019-02-14 DIAGNOSIS — F4310 Post-traumatic stress disorder, unspecified: Secondary | ICD-10-CM

## 2019-02-14 NOTE — Progress Notes (Signed)
Virtual Visit via Video Note  I connected with Sergio Garcia on 02/14/19 at 11:15 AM EDT by a video enabled telemedicine application and verified that I am speaking with the correct person using two identifiers.   I discussed the limitations of evaluation and management by telemedicine and the availability of in person appointments. The patient expressed understanding and agreed to proceed.   I discussed the assessment and treatment plan with the patient. The patient was provided an opportunity to ask questions and all were answered. The patient agreed with the plan and demonstrated an understanding of the instructions.   The patient was advised to call back or seek an in-person evaluation if the symptoms worsen or if the condition fails to improve as anticipated.   [facility] MD OP Progress Note  02/14/2019 6:34 PM TRAMON PICKING  MRN:  [id number]  Chief Complaint:  Chief Complaint    Follow-up     HPI: Sergio Garcia is a 43 year old Caucasian male, employed, lives in [place], has a history of PTSD, GAD, insomnia, GERD, PVC, Gilbert's syndrome, elevated LFT was evaluated by telemedicine today.  Patient today reports he has noticed some progress with the Prozac.  He has been taking the Prozac at bedtime since it makes him sleepy during the day.  He reports taking it at bedtime has been helpful with the drowsiness side effects.  He reports ever since he stopped the Lexapro his problems with urination has resolved.  He does not have it anymore.  He reports he also is compliant on Seroquel.  He reports sleep is good on the Seroquel.  He reports he broke up with his girlfriend 2 days ago.  He hence is currently struggling with some anxiety from the same.  He reports however that he has been coping okay.  He will continue to work with his therapist on the same.  Patient denies any suicidality, homicidality or perceptual disturbances.  Patient reports work is going well.  He reports he has been  trying to stay busy with work and that has been helpful.  He denies any other concerns today. Visit Diagnosis:    ICD-10-CM   1. PTSD (post-traumatic stress disorder)  F43.10   2. 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  Past trials of Wellbutrin, Zoloft, Effexor, Klonopin  Past Medical History:  Past Medical History:  Diagnosis Date  . Anxiety   . Degenerative disc disease   . Depression   . Enteritis   . Gastritis   . GERD (gastroesophageal reflux disease)   . Gilbert's syndrome    hyperbilirubinemia  . IBS (irritable bowel syndrome)   . Inguinal hernia   . Insomnia   . Prostatitis 2008    Past Surgical History:  Procedure Laterality Date  . BACK SURGERY     2 ruptured discs  . CHOLECYSTECTOMY  01/31/2014   GB polyp, chronic cholecystitis   . ESOPHAGOGASTRODUODENOSCOPY  09/2004   gastritis acute and duodenitis  . ESOPHAGOGASTRODUODENOSCOPY  01-14-14   Dr Allen Norris  . EXPLORATORY LAPAROTOMY  Jan 2015   Dr Burt Knack    Family Psychiatric History: I have reviewed family psychiatric history from my progress note on 09/20/2018  Family History:  Family History  Problem Relation Age of Onset  . Colon polyps Mother   . Cancer Mother  sinus cavity, on XRT  . Heart disease Father   . Heart attack Father        x 2  . Pancreatic cancer Maternal Grandfather   . Colon cancer Maternal Grandfather   . Diabetes Other        strong FH d both sides of family   . Heart Problems Paternal Grandfather   . Cancer Maternal Uncle   . Stroke Cousin        age 21/44  . Mental illness Neg Hx     Social History: I have reviewed social history from my progress note on 09/20/2018 Social History   Socioeconomic History  . Marital status: Legally Separated    Spouse name: Not on file  . Number of children: 2  . Years of education: Not  on file  . Highest education level: High school graduate  Occupational History  . Occupation: Music therapist: [employer]  . Financial resource strain: Not hard at all  . Food insecurity    Worry: Never true    Inability: Never true  . Transportation needs    Medical: No    Non-medical: No  Tobacco Use  . Smoking status: Never Smoker  . Smokeless tobacco: Never Used  Substance and Sexual Activity  . Alcohol use: Yes    Comment: occassional social  . Drug use: No  . Sexual activity: Yes  Lifestyle  . Physical activity    Days per week: 0 days    Minutes per session: 0 min  . Stress: Very much  Relationships  . Social Herbalist on phone: Not on file    Gets together: Not on file    Attends religious service: 1 to 4 times per year    Active member of club or organization: No    Attends meetings of clubs or organizations: Never    Relationship status: Separated  Other Topics Concern  . Not on file  Social History Narrative   Married.   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   Enjoys watching racing, Dealer, deer hunting.              Allergies:  Allergies  Allergen Reactions  . Morphine And Related Anaphylaxis  . Morphine Nausea And Vomiting  . Tdap [Tetanus-Diphth-Acell Pertussis] Hives and Rash    Metabolic Disorder Labs: Lab Results  Component Value Date   HGBA1C 6.0 (H) 01/24/2019   No results found for: PROLACTIN Lab Results  Component Value Date   CHOL 173 01/24/2019   TRIG 125 01/24/2019   HDL 43 01/24/2019   CHOLHDL 4.0 01/24/2019   VLDL 16.2 03/02/2018   LDLCALC 120 (H) 03/02/2018   LDLCALC 85 06/08/2015   Lab Results  Component Value Date   TSH 1.400 01/24/2019   TSH 1.88 03/02/2018    Therapeutic Level Labs: No results found for: LITHIUM No results found for: VALPROATE No components found for:  CBMZ  Current Medications: Current Outpatient Medications   Medication Sig Dispense Refill  . cetirizine-pseudoephedrine (ZYRTEC-D) 5-120 MG tablet Take 1 tablet by mouth 2 (two) times daily. 30 tablet 0  . cyclobenzaprine (FLEXERIL) 5 MG tablet Take 1 tablet (5 mg total) by mouth 3 (three) times daily as needed for muscle spasms. 15 tablet 0  . dicyclomine (BENTYL) 10 MG capsule Take 10 mg by mouth 4 (four) times daily -  before meals  and at bedtime. [facility] Dr. Gustavo Lah    . FLUoxetine (PROZAC) 20 MG capsule Take 2 capsules (40 mg total) by mouth daily. Start 20 mg for 3 weeks and increase to 40 mg ( 2 capsules) 60 capsule 1  . fluticasone (FLONASE) 50 MCG/ACT nasal spray     . mupirocin ointment (BACTROBAN) 2 % Apply 1 application topically 2 (two) times daily. B/l hands 30 g 0  . ondansetron (ZOFRAN) 4 MG tablet Take 1 tablet (4 mg total) by mouth 2 (two) times daily as needed for nausea or vomiting. 60 tablet 12  . pantoprazole (PROTONIX) 40 MG tablet Take 40 mg by mouth daily.    . QUEtiapine (SEROQUEL) 25 MG tablet Take 1.5 tablets (37.5 mg total) by mouth at bedtime. 45 tablet 1  . SUMAtriptan (IMITREX) 25 MG tablet Take 1 tablet (25 mg total) by mouth as needed for migraine. May repeat in 2 hours if headache persists or recurs. No more than 2x per week. No more than 200 mg total/24 hours 9 tablet 0   No current facility-administered medications for this visit.      Musculoskeletal: Strength & Muscle Tone: UTA Gait & Station: UTA Patient leans: N/A  Psychiatric Specialty Exam: Review of Systems  Psychiatric/Behavioral: Positive for depression.  All other systems reviewed and are negative.   There were no vitals taken for this visit.There is no height or weight on file to calculate BMI.  General Appearance: Casual  Eye Contact:  Fair  Speech:  Clear and Coherent  Volume:  Normal  Mood:  Depressed improving  Affect:  Congruent  Thought Process:  Goal Directed and Descriptions of Associations: Intact  Orientation:  Full (Time, Place,  and Person)  Thought Content: Logical   Suicidal Thoughts:  No  Homicidal Thoughts:  No  Memory:  Immediate;   Fair Recent;   Fair Remote;   Fair  Judgement:  Fair  Insight:  Fair  Psychomotor Activity:  Normal  Concentration:  Concentration: Fair and Attention Span: Fair  Recall:  [employer] of Knowledge: Fair  Language: Fair  Akathisia:  No  Handed:  Right  AIMS (if indicated): Denies tremors, rigidity, stiffness  Assets:  Communication Skills Desire for Improvement Housing Social Support  ADL's:  Intact  Cognition: WNL  Sleep:  Fair   Screenings: GAD-7     Office Visit from 01/24/2018 in [place] from 10/24/2016 in [place] at [facility]  Total GAD-7 Score  14  14    PHQ2-9     Office Visit from 01/24/2018 in [facility] from 05/10/2016 in [place] at [facility] from 11/25/2015 in [facility] Visit from 10/30/2015 in [facility]  PHQ-2 Total Score  6  0  0  0  PHQ-9 Total Score  19  -  -  -       Assessment and Plan: Sergio Garcia is a 43 year old Caucasian male, has a history of GAD, PTSD, IBS, PVC, Gilbert's syndrome was evaluated by telemedicine today.  Patient is biologically predisposed given his history of trauma.  He also has psychosocial stressors of relationship struggles, legal problems.  Patient continues to make progress on the current medication regimen.  Plan as noted below.  Plan PTSD- improving Prozac 40 mg p.o. daily Continue CBT with Ms. Cecilie Lowers.  GAD-improving Prozac as prescribed Seroquel 37.5 mg p.o. nightly Continue CBT  Insomnia-improving Seroquel as prescribed  Follow-up in clinic in 4 weeks or sooner if needed.  Patient will call back .  I have spent atleast 15 minutes non  face to face with patient today. More than 50 % of the time was spent for psychoeducation and supportive psychotherapy and care  coordination. This note was generated in part or whole with voice recognition software. Voice recognition is usually quite accurate but there are transcription errors that can and very often do occur. I apologize for any typographical errors that were not detected and corrected.       Ursula Alert, MD 02/14/2019, 6:34 PM

## 2019-02-19 ENCOUNTER — Other Ambulatory Visit: Payer: Self-pay | Admitting: Internal Medicine

## 2019-02-19 DIAGNOSIS — G43001 Migraine without aura, not intractable, with status migrainosus: Secondary | ICD-10-CM

## 2019-02-19 MED ORDER — SUMATRIPTAN SUCCINATE 25 MG PO TABS: 25.0000 mg | ORAL_TABLET | ORAL | 2 refills | Status: DC | PRN

## 2019-02-21 ENCOUNTER — Other Ambulatory Visit: Payer: Self-pay

## 2019-02-21 ENCOUNTER — Encounter: Payer: Self-pay | Admitting: Internal Medicine

## 2019-02-21 ENCOUNTER — Ambulatory Visit (INDEPENDENT_AMBULATORY_CARE_PROVIDER_SITE_OTHER): Payer: BC Managed Care – PPO | Admitting: Internal Medicine

## 2019-02-21 VITALS — Ht 74.0 in | Wt 190.0 lb

## 2019-02-21 DIAGNOSIS — R5383 Other fatigue: Secondary | ICD-10-CM

## 2019-02-21 DIAGNOSIS — Z0000 Encounter for general adult medical examination without abnormal findings: Secondary | ICD-10-CM

## 2019-02-21 DIAGNOSIS — G43001 Migraine without aura, not intractable, with status migrainosus: Secondary | ICD-10-CM

## 2019-02-21 DIAGNOSIS — R634 Abnormal weight loss: Secondary | ICD-10-CM

## 2019-02-21 DIAGNOSIS — J341 Cyst and mucocele of nose and nasal sinus: Secondary | ICD-10-CM | POA: Insufficient documentation

## 2019-02-21 DIAGNOSIS — R7303 Prediabetes: Secondary | ICD-10-CM | POA: Diagnosis not present

## 2019-02-21 MED ORDER — SUMATRIPTAN SUCCINATE 50 MG PO TABS: 50.0000 mg | ORAL_TABLET | ORAL | 2 refills | Status: DC | PRN

## 2019-02-21 NOTE — Progress Notes (Signed)
Telephone Note failed virtual  I connected with Mancil Pfenning  on 02/21/19 at 10:15 AM EDT by telephone and verified that I am speaking with the correct person using two identifiers.  Location patient: home Location provider:work or home office Persons participating in the virtual visit: patient, provider  I discussed the limitations of evaluation and management by telemedicine and the availability of in person appointments. The patient expressed understanding and agreed to proceed.   HPI: 1. Anxiety/depression/insomnia sleep improved and energy fatigue he did have depression over the weekend but talking with church therapist his regular therapist at [facility] helps and Dr. Shea Evans. Changed from prozac 40 to seroquil 37.5 meds he thinks are helping and he is improving  2. Still having h/a 7/10 to 4/10 and imitrex 25  does help some and takes the edge off he only takes 2x per week and has cut back on caffeine soda. 6-8 months ago saw Dr. Marvel Plan eye md who stated his vision was 20/20 after lasik years ago  Neurology appt 03/18/19 10:30 am  3. Urinary complaints resolved after stopping lexapro and changing to prozac so cancelled urology appt  4. Annual    ROS: See pertinent positives and negatives per HPI. General: energy improved  HEENT: no sore throat  CV: no chest pain  Lungs: no sob  GI: no ab pain +chronic diarrhea  GU: urinary sx's imrpoved  MSK: no jt pain  Neuro: +h/a  Psych+depression sl improved, improved insomnia Skin: wounds improved to hands with bactroban  Past Medical History:  Diagnosis Date  . Anxiety   . Degenerative disc disease   . Depression   . Enteritis   . Gastritis   . GERD (gastroesophageal reflux disease)   . Gilbert's syndrome    hyperbilirubinemia  . IBS (irritable bowel syndrome)   . Inguinal hernia   . Insomnia   . Prostatitis 2008    Past Surgical History:  Procedure Laterality Date  . BACK SURGERY     2 ruptured discs  . CHOLECYSTECTOMY   01/31/2014   GB polyp, chronic cholecystitis   . ESOPHAGOGASTRODUODENOSCOPY  09/2004   gastritis acute and duodenitis  . ESOPHAGOGASTRODUODENOSCOPY  01-14-14   Dr Allen Norris  . EXPLORATORY LAPAROTOMY  Jan 2015   Dr Burt Knack  . LASIK      Family History  Problem Relation Age of Onset  . Colon polyps Mother   . Cancer Mother        sinus cavity, on XRT  . Heart disease Father   . Heart attack Father        x 2  . Pancreatic cancer Maternal Grandfather   . Colon cancer Maternal Grandfather   . Diabetes Other        strong FH d both sides of family   . Heart Problems Paternal Grandfather   . Cancer Maternal Uncle   . Stroke Cousin        age 77/44  . Mental illness Neg Hx     SOCIAL HX:  Married. Lives in [place]  2 children. Enjoys watching racing, Dealer, deer hunting.     Current Outpatient Medications:  .  FLUoxetine (PROZAC) 20 MG capsule, Take 2 capsules (40 mg total) by mouth daily. Start 20 mg for 3 weeks and increase to 40 mg ( 2 capsules), Disp: 60 capsule, Rfl: 1 .  fluticasone (FLONASE) 50 MCG/ACT nasal spray, , Disp: , Rfl:  .  ondansetron (ZOFRAN) 4 MG tablet, Take  1 tablet (4 mg total) by mouth 2 (two) times daily as needed for nausea or vomiting., Disp: 60 tablet, Rfl: 12 .  pantoprazole (PROTONIX) 40 MG tablet, Take 40 mg by mouth daily., Disp: , Rfl:  .  QUEtiapine (SEROQUEL) 25 MG tablet, Take 1.5 tablets (37.5 mg total) by mouth at bedtime., Disp: 45 tablet, Rfl: 1 .  SUMAtriptan (IMITREX) 50 MG tablet, Take 1 tablet (50 mg total) by mouth as needed for migraine. May repeat in 2 hours if headache persists or recurs. No more than 2x per week. No more than 200 mg total/24 hours, Disp: 9 tablet, Rfl: 2 .  cetirizine-pseudoephedrine (ZYRTEC-D) 5-120 MG tablet, Take 1 tablet by mouth 2 (two) times daily., Disp: 30 tablet, Rfl: 0 .  cyclobenzaprine (FLEXERIL) 5 MG tablet, Take 1 tablet (5 mg total) by mouth 3 (three) times daily as  needed for muscle spasms., Disp: 15 tablet, Rfl: 0 .  dicyclomine (BENTYL) 10 MG capsule, Take 10 mg by mouth 4 (four) times daily -  before meals and at bedtime. [facility] Dr. Gustavo Lah, Disp: , Rfl:  .  mupirocin ointment (BACTROBAN) 2 %, Apply 1 application topically 2 (two) times daily. B/l hands, Disp: 30 g, Rfl: 0  EXAM:  VITALS per patient if applicable:  GENERAL: alert, oriented, appears well and in no acute distress  HEENT: atraumatic, conjunttiva clear, no obvious abnormalities on inspection of external nose and ears  NECK: normal movements of the head and neck  LUNGS: on inspection no signs of respiratory distress, breathing rate appears normal, no obvious gross SOB, gasping or wheezing  CV: no obvious cyanosis  MS: moves all visible extremities without noticeable abnormality  PSYCH/NEURO: pleasant and cooperative, no obvious depression or anxiety, speech and thought processing grossly intact  ASSESSMENT AND PLAN:  Discussed the following assessment and plan:  Annual physical exam Flu shotutd  utd Tdap had 06/20/11 and 01/04/2018 [facility] rec MMR vaccine Completed hep B 3/3 vxs 12/2017 [facility] -consider check titer in future if not done hep B in future  Prediabetes 6.0 A1C9/3/20 rec healthy diet and exercise  Dermatology saw [facility] 2019 left scalp bx negative   07/25/2018 stomach bx with EGD Dr. Gustavo Lah mild chronic gastritis and mild foveolar hyperplasia negative H pylori no repeat , neg metaplasianegative barretts or adenoca -no repeat EGD needed   07/25/2018 Colonoscopy per report due in 5 years -mild chronic gastritis and foveolar hyperplasia   EGD 01/14/14 +gastritis negative bxs for H pylori or any other etiology   PSA 01/24/19 1.5 normal   Of note:  normal spirometry 12/25/12  Negative small bowel series 05/29/13   CT renal 10/07/16 -no c/o back pain hold imaging for now as of 01/23/19 had Xray 08/2018 ED, MRI L spine 11/09/16  and 10/27/07  IMPRESSION: 1. No acute findings. No renal or ureteral stones. No obstructive uropathy. 2. Status post cholecystectomy. 3. Evidence of a broad-based disc protrusion at L4-L5 narrowing the right lateral recess. Consider further assessment with lumbar MRI with and without contrast if the patient has symptoms consistent with nerve impingement.   Xray eye 11/09/16  IMPRESSION: 1. No evidence of metallic foreign body within the orbits. 2. 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  -of note pt wants removed   US thyroid 12/14/09 normal  Stress test negative unc 01/05/18 and CXR 12/25/17 negative   Eye Dr. Marvel Plan    Migraine without aura and with status migrainosus, not intractable -  Plan: SUMAtriptan (IMITREX) 50 MG tablet increase dose from 25 no more than 2x per week  Neurology appt [facility] 03/18/19 10:30 am   Fatigue, unspecified type improved   Weight loss, non-intentional Abnormal weight loss down 13 lbs since 05/2018 and not trying and appetite normal ? Etiology uncontrolled psych sx's I.e anxiety/PTSD IBS-D (06/09/18 C diff negative),  R/o other etiology HIV (negative), denies drugs (I.e cocaine, amp), internal malignancy possibly as etiology but just had EGD/colonoscopy 07/24/28 [facility] and not c/w GI malignancy TSH normal 1.40, negative celiac, normal PSA Hep C negative 07/26/13 and hep B negative 07/26/13: denies drugs  Normal spriometry 12/25/12   Abnormal weight loss  Could be due to psychiatric reasons no organic cause  -advised pt to monitor wt 2x per week and my chart in 2 weeks after he buys a scale    Mucous retention cyst of maxillary sinus-pt wants removed will disc with Dr. Tami Ribas ent f/u 02/2019 or 03/2019  Of note saw Dr. Tami Ribas 02/04/2019 sinusitis and right max sinus cyst doubt cause of h/a recheck after neurology appt right nasal endoscopy and left RTC in 6 weeks   Open wound b/l hands Healed   Acute intractable headache frontal  and daily increasing in freq associated with dizziness/nausea, abnormal gait and change in speech w/o h/o migraines Xray 11/09/16 chronic right maxillary sinus retention cyst ? If this could cause h/a though ENT does not think so- Plan: MR Brain W Wo Contrast neg other than cyst   ondansetron (ZOFRAN) 4 MG tablet, SUMAtriptan (IMITREX) 50 MG tablet prn no more than 200 mg in 1 day or 2x per week  Prn Tylenol  Avoid BC/goody powder   [facility] appt 03/18/19  Also consider sleep study r/o sleep apnea as source of fatigue with neurology    Anxiety and depression PTSD (post-traumatic stress disorder) Insomnia due to mental condition -Dr. Shea Evans f/u 03/16/19 on prozac 40 off lexapro which caused urinary side effects also on and seroquel 37.5   Fatigue/energy improved-etiology may be related to mental health no organic/internal etiology as of yet B12 normal 693        -we discussed possible serious and likely etiologies, options for evaluation and workup, limitations of telemedicine visit vs in person visit, treatment, treatment risks and precautions. Pt prefers to treat via telemedicine empirically rather then risking or undertaking an in person visit at this moment. Patient agrees to seek prompt in person care if worsening, new symptoms arise, or if is not improving with treatment.   I discussed the assessment and treatment plan with the patient. The patient was provided an opportunity to ask questions and all were answered. The patient agreed with the plan and demonstrated an understanding of the instructions.   The patient was advised to call back or seek an in-person evaluation if the symptoms worsen or if the condition fails to improve as anticipated.  Time spent 15 minutes  Delorise Jackson, MD

## 2019-03-08 ENCOUNTER — Telehealth: Payer: Self-pay

## 2019-03-08 NOTE — Telephone Encounter (Signed)
Patient does have a refills on seroquel to last until his next appointment

## 2019-03-08 NOTE — Telephone Encounter (Signed)
Patient called requesting a refill on his Quetiapine 25mg  to be sent to [facility] on [address] in [place]. He has a scheduled appointment on 03/18/19. Thank you.

## 2019-03-08 NOTE — Telephone Encounter (Signed)
Notified patient.

## 2019-03-12 ENCOUNTER — Other Ambulatory Visit: Payer: Self-pay

## 2019-03-12 ENCOUNTER — Ambulatory Visit (INDEPENDENT_AMBULATORY_CARE_PROVIDER_SITE_OTHER): Payer: BC Managed Care – PPO | Admitting: Licensed Clinical Social Worker

## 2019-03-12 ENCOUNTER — Encounter: Payer: Self-pay | Admitting: Licensed Clinical Social Worker

## 2019-03-12 DIAGNOSIS — F4310 Post-traumatic stress disorder, unspecified: Secondary | ICD-10-CM | POA: Diagnosis not present

## 2019-03-12 NOTE — Progress Notes (Signed)
Virtual Visit via Video Note  I connected with Sergio Garcia on 03/12/19 at 10:00 AM EDT by a video enabled telemedicine application and verified that I am speaking with the correct person using two identifiers.   I discussed the limitations of evaluation and management by telemedicine and the availability of in person appointments. The patient expressed understanding and agreed to proceed.    I discussed the assessment and treatment plan with the patient. The patient was provided an opportunity to ask questions and all were answered. The patient agreed with the plan and demonstrated an understanding of the instructions.   The patient was advised to call back or seek an in-person evaluation if the symptoms worsen or if the condition fails to improve as anticipated.  I provided 45 minutes of non-face-to-face time during this encounter.   Alden Hipp, LCSW    THERAPIST PROGRESS NOTE  Session Time: 1000  Participation Level: Active  Behavioral Response: CasualAlertAnxious  Type of Therapy: Individual Therapy  Treatment Goals addressed: Coping  Interventions: CBT  Summary: REXX MCCARSON is a 43 y.o. male who presents with continued symptoms related to his diagnosis. Jayln reports doing well since our last session and noted, "I've had a lot of ups and downs, but mainly ups recently." Amram reported he and his girlfriend broke up. He stated she was unwilling to empathize with his feelings regarding her male friend, and blew up on him. Al reported she broke up with him, and at first he was very sad and upset. However, Tashaun reports that as time has passed, he realized it was the right decision. Hubbert recounted several examples of behaviors that did not demonstrate a healthy relationship pattern between him and his ex. LCSW validated Nehal's ability to recognize these patterns and realize he is not willing to put up with it. He reports he thinks this has something to do with his  relationship with his mother--from whom he never felt loved. He reported not being able to remember the last time his mother told him she loved him. After discussing this more, it was established one of Gilverto's core beliefs is he is undeserving of love. LCSW asked Nieko to elaborate on why he felt that way. His primary reasons were his mother and his three marriages. LCSW validated those events and asked Ameya to provide events that contributed to a more adaptive belief of being worthy of love. Randalph was able to do this easily. LCSW pointed out that we often cling to the evidence that supports our thoughts--in this case, not being worthy of love. LCSW asked Seyed to continue adding to the list and reference it when he is feeling down. Silas expressed understanding and agreement with this information.   Suicidal/Homicidal: No  Therapist Response: Quantavious continues to work towards his tx goals but has not yet reached them. We will continue to work on emotional regulation skills and improving distress tolerance.   Plan: Return again in 4 weeks.  Diagnosis: Axis I: Post Traumatic Stress Disorder    Axis II: No diagnosis    Alden Hipp, LCSW 03/12/2019

## 2019-03-18 ENCOUNTER — Encounter: Payer: Self-pay | Admitting: Psychiatry

## 2019-03-18 ENCOUNTER — Encounter: Payer: Self-pay | Admitting: Diagnostic Neuroimaging

## 2019-03-18 ENCOUNTER — Ambulatory Visit: Payer: BC Managed Care – PPO | Admitting: Diagnostic Neuroimaging

## 2019-03-18 ENCOUNTER — Other Ambulatory Visit: Payer: Self-pay

## 2019-03-18 ENCOUNTER — Ambulatory Visit (INDEPENDENT_AMBULATORY_CARE_PROVIDER_SITE_OTHER): Payer: BC Managed Care – PPO | Admitting: Psychiatry

## 2019-03-18 ENCOUNTER — Telehealth: Payer: Self-pay | Admitting: *Deleted

## 2019-03-18 DIAGNOSIS — F411 Generalized anxiety disorder: Secondary | ICD-10-CM

## 2019-03-18 DIAGNOSIS — F4310 Post-traumatic stress disorder, unspecified: Secondary | ICD-10-CM

## 2019-03-18 DIAGNOSIS — F5105 Insomnia due to other mental disorder: Secondary | ICD-10-CM

## 2019-03-18 MED ORDER — FLUOXETINE HCL 40 MG PO CAPS: 40.0000 mg | ORAL_CAPSULE | Freq: Every day | ORAL | 0 refills | Status: DC

## 2019-03-18 MED ORDER — QUETIAPINE FUMARATE 25 MG PO TABS: 37.5000 mg | ORAL_TABLET | Freq: Every day | ORAL | 0 refills | Status: DC

## 2019-03-18 NOTE — Progress Notes (Signed)
Virtual Visit via Video Note  I connected with Sergio Garcia on 03/18/19 at  9:45 AM EDT by a video enabled telemedicine application and verified that I am speaking with the correct person using two identifiers.   I discussed the limitations of evaluation and management by telemedicine and the availability of in person appointments. The patient expressed understanding and agreed to proceed.   I discussed the assessment and treatment plan with the patient. The patient was provided an opportunity to ask questions and all were answered. The patient agreed with the plan and demonstrated an understanding of the instructions.   The patient was advised to call back or seek an in-person evaluation if the symptoms worsen or if the condition fails to improve as anticipated.   [place] MD OP Progress Note  03/18/2019 12:54 PM ALMO LECRONE  MRN:  [id number]  Chief Complaint:  Chief Complaint    Follow-up     HPI: Sergio Garcia is a 43-year male, employed, lives in [place], has a history of PTSD, GAD, insomnia, GERD, PVC, Gilbert's syndrome, elevated LFT was evaluated by telemedicine today.  Patient today reports he has noticed improvement in his depressive symptoms.  He reports he does not feel as sad as he used to before.  His motivation has improved.  He reports sleep is improved on the medications.  He is compliant on medications like Prozac and Seroquel.  He denies side effects.  He continues to be in psychotherapy sessions with Ms. Alden Hipp which is going well.  Patient denies any suicidality, homicidality or perceptual disturbances.  Patient denies any other concerns today. Visit Diagnosis:    ICD-10-CM   1. PTSD (post-traumatic stress disorder)  F43.10 QUEtiapine (SEROQUEL) 25 MG tablet    FLUoxetine (PROZAC) 40 MG capsule  2. Generalized anxiety disorder  F41.1 QUEtiapine (SEROQUEL) 25 MG tablet    FLUoxetine (PROZAC) 40 MG capsule  3. Insomnia due to mental condition  F51.05  QUEtiapine (SEROQUEL) 25 MG tablet    Past Psychiatric History: I have reviewed past psychiatric history from my progress note on 09/20/2018.  Past trials of Wellbutrin, Zoloft, Effexor, Klonopin  Past Medical History:  Past Medical History:  Diagnosis Date  . Anxiety   . Degenerative disc disease   . Depression   . Enteritis   . Gastritis   . GERD (gastroesophageal reflux disease)   . Gilbert's syndrome    hyperbilirubinemia  . IBS (irritable bowel syndrome)   . Inguinal hernia   . Insomnia   . Prostatitis 2008    Past Surgical History:  Procedure Laterality Date  . BACK SURGERY     2 ruptured discs  . CHOLECYSTECTOMY  01/31/2014   GB polyp, chronic cholecystitis   . ESOPHAGOGASTRODUODENOSCOPY  09/2004   gastritis acute and duodenitis  . ESOPHAGOGASTRODUODENOSCOPY  01-14-14   Dr Allen Norris  . EXPLORATORY LAPAROTOMY  Jan 2015   Dr Burt Knack  . LASIK      Family Psychiatric History: I have reviewed family psychiatric history from my progress note on 09/20/2018  Family History:  Family History  Problem Relation Age of Onset  . Colon polyps Mother   . Cancer Mother        sinus cavity, on XRT  . Heart disease Father   . Heart attack Father        x 2  . Pancreatic cancer Maternal Grandfather   . Colon cancer Maternal Grandfather   . Diabetes Other        strong  FH d both sides of family   . Heart Problems Paternal Grandfather   . Cancer Maternal Uncle   . Stroke Cousin        age 68/44  . Mental illness Neg Hx     Social History: I have reviewed social history from my progress note on 09/20/2018 Social History   Socioeconomic History  . Marital status: Legally Separated    Spouse name: Not on file  . Number of children: 2  . Years of education: Not on file  . Highest education level: High school graduate  Occupational History  . Occupation: Music therapist: [employer]  . Financial resource strain: Not hard at all  . Food  insecurity    Worry: Never true    Inability: Never true  . Transportation needs    Medical: No    Non-medical: No  Tobacco Use  . Smoking status: Never Smoker  . Smokeless tobacco: Never Used  Substance and Sexual Activity  . Alcohol use: Yes    Comment: occassional social  . Drug use: No  . Sexual activity: Yes  Lifestyle  . Physical activity    Days per week: 0 days    Minutes per session: 0 min  . Stress: Very much  Relationships  . Social Herbalist on phone: Not on file    Gets together: Not on file    Attends religious service: 1 to 4 times per year    Active member of club or organization: No    Attends meetings of clubs or organizations: Never    Relationship status: Separated  Other Topics Concern  . Not on file  Social History Narrative   Married.   Lives in [place]    2 children.   Enjoys watching racing, Dealer, deer hunting.              Allergies:  Allergies  Allergen Reactions  . Morphine And Related Anaphylaxis  . Morphine Nausea And Vomiting  . Tdap [Tetanus-Diphth-Acell Pertussis] Hives and Rash    Metabolic Disorder Labs: Lab Results  Component Value Date   HGBA1C 6.0 (H) 01/24/2019   No results found for: PROLACTIN Lab Results  Component Value Date   CHOL 173 01/24/2019   TRIG 125 01/24/2019   HDL 43 01/24/2019   CHOLHDL 4.0 01/24/2019   VLDL 16.2 03/02/2018   LDLCALC 108 (H) 01/24/2019   LDLCALC 120 (H) 03/02/2018   Lab Results  Component Value Date   TSH 1.400 01/24/2019   TSH 1.88 03/02/2018    Therapeutic Level Labs: No results found for: LITHIUM No results found for: VALPROATE No components found for:  CBMZ  Current Medications: Current Outpatient Medications  Medication Sig Dispense Refill  . pantoprazole (PROTONIX) 40 MG tablet Take 40 mg by mouth daily.    . QUEtiapine (SEROQUEL) 25 MG tablet Take 1.5 tablets (37.5 mg total) by mouth at bedtime. 135 tablet 0  .  SUMAtriptan (IMITREX) 50 MG tablet Take 1 tablet (50 mg total) by mouth as needed for migraine. May repeat in 2 hours if headache persists or recurs. No more than 2x per week. No more than 200 mg total/24 hours 9 tablet 2  . cetirizine-pseudoephedrine (ZYRTEC-D) 5-120 MG tablet Take 1 tablet by mouth 2 (two) times daily. 30 tablet 0  . FLUoxetine (PROZAC) 40 MG capsule Take 1 capsule (40 mg total) by mouth daily.  90 capsule 0  . fluticasone (FLONASE) 50 MCG/ACT nasal spray      No current facility-administered medications for this visit.      Musculoskeletal: Strength & Muscle Tone: UTA Gait & Station: normal Patient leans: N/A  Psychiatric Specialty Exam: Review of Systems  Psychiatric/Behavioral: Positive for depression. Negative for substance abuse. The patient is not nervous/anxious and does not have insomnia.   All other systems reviewed and are negative.   There were no vitals taken for this visit.There is no height or weight on file to calculate BMI.  General Appearance: Casual  Eye Contact:  Fair  Speech:  Clear and Coherent  Volume:  Normal  Mood:  Depressedimproving  Affect:  Congruent  Thought Process:  Goal Directed and Descriptions of Associations: Intact  Orientation:  Full (Time, Place, and Person)  Thought Content: Logical   Suicidal Thoughts:  No  Homicidal Thoughts:  No  Memory:  Immediate;   Fair Recent;   Fair Remote;   Fair  Judgement:  Fair  Insight:  Fair  Psychomotor Activity:  Normal  Concentration:  Concentration: Fair and Attention Span: Fair  Recall:  [employer] of Knowledge: Fair  Language: Fair  Akathisia:  No  Handed:  Right  AIMS (if indicated): Denies tremors, rigidity  Assets:  Communication Skills Desire for Improvement Housing Social Support  ADL's:  Intact  Cognition: WNL  Sleep:  Fair   Screenings: GAD-7     Office Visit from 01/24/2018 in [place] from 10/24/2016 in [place] at  [facility]  Total GAD-7 Score  14  14    PHQ2-9     Office Visit from 02/21/2019 in [place] from 01/24/2018 in [place] from 05/10/2016 in [place] at [place] from 11/25/2015 in [place] Visit from 10/30/2015 in [place]  PHQ-2 Total Score  0  6  0  0  0  PHQ-9 Total Score  -  19  -  -  -       Assessment and Plan: Travor is a 43 year old male, lives in [place], separated, employed, who has a history of PTSD, IBS, PVC, Gilbert's syndrome was evaluated by telemedicine today.  Patient is biologically predisposed given his history of trauma.  He also has psychosocial stressors of relationship struggles, legal problems.  Patient however is currently making progress.  Plan as noted below.  Plan PTSD-stable Prozac 40 mg p.o. daily Continue CBT with Ms. Cecilie Lowers  GAD-stable Prozac as prescribed Seroquel 37.5 mg p.o. nightly Continue CBT  Insomnia-stable Seroquel as prescribed  Follow-up in clinic in 4 weeks or sooner if needed.  December 8 at 2 PM  I have spent atleast 15 minutes non  face to face with patient today. More than 50 % of the time was spent for psychoeducation and supportive psychotherapy and care coordination. This note was generated in part or whole with voice recognition software. Voice recognition is usually quite accurate but there are transcription errors that can and very often do occur. I apologize for any typographical errors that were not detected and corrected.       Ursula Alert, MD 03/18/2019, 12:54 PM

## 2019-03-18 NOTE — Telephone Encounter (Signed)
Patient was no show for new patient appointment today. 

## 2019-03-20 ENCOUNTER — Encounter (INDEPENDENT_AMBULATORY_CARE_PROVIDER_SITE_OTHER): Payer: BC Managed Care – PPO | Admitting: Internal Medicine

## 2019-03-20 DIAGNOSIS — J341 Cyst and mucocele of nose and nasal sinus: Secondary | ICD-10-CM | POA: Diagnosis not present

## 2019-03-20 DIAGNOSIS — J321 Chronic frontal sinusitis: Secondary | ICD-10-CM

## 2019-03-21 ENCOUNTER — Other Ambulatory Visit: Payer: Self-pay | Admitting: Internal Medicine

## 2019-03-21 DIAGNOSIS — J321 Chronic frontal sinusitis: Secondary | ICD-10-CM

## 2019-03-21 DIAGNOSIS — J0110 Acute frontal sinusitis, unspecified: Secondary | ICD-10-CM

## 2019-03-21 MED ORDER — AZITHROMYCIN 250 MG PO TABS: ORAL_TABLET | ORAL | 0 refills | Status: DC

## 2019-03-21 MED ORDER — FEXOFENADINE HCL 180 MG PO TABS: 180.0000 mg | ORAL_TABLET | Freq: Every evening | ORAL | 3 refills | Status: DC | PRN

## 2019-03-21 MED ORDER — FLUTICASONE PROPIONATE 50 MCG/ACT NA SUSP: 2.0000 | Freq: Every day | NASAL | 11 refills | Status: DC | PRN

## 2019-03-21 MED ORDER — SALINE SPRAY 0.65 % NA SOLN: 2.0000 | NASAL | 11 refills | Status: DC | PRN

## 2019-03-21 NOTE — Telephone Encounter (Signed)
Yes, I agree for you to bill my insurance.   I have had these symptoms for approximately a week. I have tried mucenix sinus, Tylenol cold, zyrtec.   I will call the ENT tomorrow and schedule a follow up.   Thank You Sergio Garcia         We can address this via my chart message as long as you are agreeable for me to bill your insurance since I will be sending in a medication for you and treating  Are you agreeable to a bill to your insurance?   I need more details  How long have you been having symptoms ? What have you tried over the counter?    Also you need to follow up with ENT about the large maxillary cyst in your sinus please call and schedule an appointment if you do not have one scheduled already    [name]           Dr. Aundra Dubin,  I have been having alot of pressure in my face, behind my eyes and in my nose across my forehead. It feels like a sinus infection. I have not had a fever or body aches. I haven't been around anyone with covid. Do I need to make an appointment to come in and see you or can you give me an antibiotic?   Thank You Sergio Garcia    A/p  C/w sinusitis  Rx Zpack x 5 days  Nasal saline, flonase, tylenol prn and f/u with ENT about sinus cyst pt had appt   Agreeable to fee  Time spent 5-10 my chart [name]

## 2019-04-02 ENCOUNTER — Other Ambulatory Visit: Payer: Self-pay

## 2019-04-02 ENCOUNTER — Other Ambulatory Visit: Payer: Self-pay | Admitting: Psychiatry

## 2019-04-02 DIAGNOSIS — F411 Generalized anxiety disorder: Secondary | ICD-10-CM

## 2019-04-02 DIAGNOSIS — F4310 Post-traumatic stress disorder, unspecified: Secondary | ICD-10-CM

## 2019-04-03 ENCOUNTER — Ambulatory Visit: Payer: BC Managed Care – PPO | Admitting: Internal Medicine

## 2019-04-08 ENCOUNTER — Other Ambulatory Visit: Payer: Self-pay

## 2019-04-09 ENCOUNTER — Other Ambulatory Visit: Payer: Self-pay

## 2019-04-09 ENCOUNTER — Encounter: Payer: Self-pay | Admitting: Internal Medicine

## 2019-04-09 ENCOUNTER — Other Ambulatory Visit (HOSPITAL_COMMUNITY)
Admission: RE | Admit: 2019-04-09 | Discharge: 2019-04-09 | Disposition: A | Discharge status: Home / Self Care | Payer: BC Managed Care – PPO | Source: Ambulatory Visit | Attending: Internal Medicine | Admitting: Internal Medicine

## 2019-04-09 ENCOUNTER — Ambulatory Visit: Payer: BC Managed Care – PPO | Admitting: Internal Medicine

## 2019-04-09 VITALS — BP 122/76 | HR 76 | Temp 97.4°F | Ht 74.0 in | Wt 191.0 lb

## 2019-04-09 DIAGNOSIS — Z113 Encounter for screening for infections with a predominantly sexual mode of transmission: Secondary | ICD-10-CM | POA: Diagnosis present

## 2019-04-09 DIAGNOSIS — Z20822 Contact with and (suspected) exposure to covid-19: Secondary | ICD-10-CM

## 2019-04-09 NOTE — Patient Instructions (Signed)
Genital Herpes Genital herpes is a common sexually transmitted infection (STI) that is caused by a virus. The virus spreads from person to person through sexual contact. Infection can cause itching, blisters, and sores around the genitals or rectum. Symptoms may last several days and then go away This is called an outbreak. However, the virus remains in your body, so you may have more outbreaks in the future. The time between outbreaks varies and can be months or years. Genital herpes affects men and women. It is particularly concerning for pregnant women because the virus can be passed to the baby during delivery and can cause serious problems. Genital herpes is also a concern for people who have a weak disease-fighting (immune) system. What are the causes? This condition is caused by the herpes simplex virus (HSV) type 1 or type 2. The virus may spread through:  Sexual contact with an infected person, including vaginal, anal, and oral sex.  Contact with fluid from a herpes sore.  The skin. This means that you can get herpes from an infected partner even if he or she does not have a visible sore or does not know that he or she is infected. What increases the risk? You are more likely to develop this condition if:  You have sex with many partners.  You do not use latex condoms during sex. What are the signs or symptoms? Most people do not have symptoms (asymptomatic) or have mild symptoms that may be mistaken for other skin problems. Symptoms may include:  Small red bumps near the genitals, rectum, or mouth. These bumps turn into blisters and then turn into sores.  Flu-like symptoms, including: ? Fever. ? Body aches. ? Swollen lymph nodes. ? Headache.  Painful urination.  Pain and itching in the genital area or rectal area.  Vaginal discharge.  Tingling or shooting pain in the legs and buttocks. Generally, symptoms are more severe and last longer during the first (primary)  outbreak. Flu-like symptoms are also more common during the primary outbreak. How is this diagnosed? Genital herpes may be diagnosed based on:  A physical exam.  Your medical history.  Blood tests.  A test of a fluid sample (culture) from an open sore. How is this treated? There is no cure for this condition, but treatment with antiviral medicines that are taken by mouth (orally) can do the following:  Speed up healing and relieve symptoms.  Help to reduce the spread of the virus to sexual partners.  Limit the chance of future outbreaks, or make future outbreaks shorter.  Lessen symptoms of future outbreaks. Your health care provider may also recommend pain relief medicines, such as aspirin or ibuprofen. Follow these instructions at home: Sexual activity  Do not have sexual contact during active outbreaks.  Practice safe sex. Latex condoms and male condoms may help prevent the spread of the herpes virus. General instructions  Keep the affected areas dry and clean.  Take over-the-counter and prescription medicines only as told by your health care provider.  Avoid rubbing or touching blisters and sores. If you do touch blisters or sores: ? Wash your hands thoroughly with soap and water. ? Do not touch your eyes afterward.  To help relieve pain or itching, you may take the following actions as directed by your health care provider: ? Apply a cold, wet cloth (cold compress) to affected areas 4-6 times a day. ? Apply a substance that protects your skin and reduces bleeding (astringent). ? Apply a gel that  helps relieve pain around sores (lidocaine gel). ? Take a warm, shallow bath that cleans the genital area (sitz bath).  Keep all follow-up visits as told by your health care provider. This is important. How is this prevented?  Use condoms. Although anyone can get genital herpes during sexual contact, even with the use of a condom, a condom can provide some protection.   Avoid having multiple sexual partners.  Talk with your sexual partner about any symptoms either of you may have. Also, talk with your partner about any history of STIs.  Get tested for STIs before you have sex. Ask your partner to do the same.  Do not have sexual contact if you have symptoms of genital herpes. Contact a health care provider if:  Your symptoms are not improving with medicine.  Your symptoms return.  You have new symptoms.  You have a fever.  You have abdominal pain.  You have redness, swelling, or pain in your eye.  You notice new sores on other parts of your body.  You are a woman and experience bleeding between menstrual periods.  You have had herpes and you become pregnant or plan to become pregnant. Summary  Genital herpes is a common sexually transmitted infection (STI) that is caused by the herpes simplex virus (HSV) type 1 or type 2.  These viruses are most often spread through sexual contact with an infected person.  You are more likely to develop this condition if you have sex with many partners or you have unprotected sex.  Most people do not have symptoms (asymptomatic) or have mild symptoms that may be mistaken for other skin problems. Symptoms occur as outbreaks that may happen months or years apart.  There is no cure for this condition, but treatment with oral antiviral medicines can reduce symptoms, reduce the chance of spreading the virus to a partner, prevent future outbreaks, or shorten future outbreaks. This information is not intended to replace advice given to you by your health care provider. Make sure you discuss any questions you have with your health care provider. Document Released: 05/06/2000 Document Revised: 11/13/2017 Document Reviewed: 04/08/2016 Elsevier Patient Education  [place].  Cold Sore  A cold sore, also called a fever blister, is a small, fluid-filled sore that forms inside the mouth or on the lips, gums,  nose, chin, or cheeks. Cold sores can spread to other parts of the body, such as the eyes or fingers. In some people who have other medical conditions, cold sores can spread to multiple other body sites, including the genitals. Cold sores can spread from person to person (are contagious) until the sores crust over completely. Most cold sores go away within 2 weeks. What are the causes? Cold sores are caused by an infection from a common type of herpes simplex virus (HSV-1). HSV-1 is closely related to the HSV-2virus, which is the virus that causes genital herpes, but these viruses are not the same. Once a person is infected with HSV-1, the virus remains permanently in the body. HSV-1 is spread from person to person through close contact, such as through kissing, touching the affected area, or sharing personal items such as lip balm, razors, a drinking glass, or eating utensils. What increases the risk? You are more likely to develop this condition if you:  Are tired, stressed, or sick.  Are menstruating.  Are pregnant.  Take certain medicines.  Are exposed to cold weather or too much sun. What are the signs or symptoms?  Symptoms of a cold sore outbreak go through different stages. These are the stages of a cold sore:  Tingling, itching, or burning is felt 1-2 days before the outbreak.  Fluid-filled blisters appear on the lips, inside the mouth, on the nose, or on the cheeks.  The blisters start to ooze clear fluid.  The blisters dry up, and a yellow crust appears in their place.  The crust falls off. In some cases, other symptoms can develop during a cold sore outbreak. These can include:  Fever.  Sore throat.  Headache.  Muscle aches.  Swollen neck glands. How is this diagnosed? This condition is diagnosed based on your medical history and a physical exam. Your health care provider may do a blood test or may swab some fluid from your sore and then examine the swab in the  lab. How is this treated? There is no cure for cold sores or HSV-1. There is also no vaccine for HSV-1. Most cold sores go away on their own without treatment within 2 weeks. Medicines cannot make the infection go away, but your health care provider may prescribe medicines to:  Help relieve some of the pain associated with the sores.  Work to stop the virus from multiplying.  Shorten healing time. Medicines may be in the form of creams, gels, pills, or a shot. Follow these instructions at home: Medicines  Take or apply over-the-counter and prescription medicines only as told by your health care provider.  Use a cotton-tip swab to apply creams or gels to your sores.  Ask your health care provider if you can take lysine supplements. Research has found that lysine may help heal the cold sore faster and prevent outbreaks. Sore care   Do not touch the sores or pick the scabs.  Wash your hands often. Do not touch your eyes without washing your hands first.  Keep the sores clean and dry.  If directed, apply ice to the sores: ? Put ice in a plastic bag. ? Place a towel between your skin and the bag. ? Leave the ice on for 20 minutes, 2-3 times a day. Eating and drinking  Eat a soft, bland diet. Avoid eating hot, cold, or salty foods.  Use a straw if it hurts to drink out of a glass.  Eat foods that are rich in lysine, such as meat, fish, and dairy products.  Avoid sugary foods, chocolates, nuts, and grains. These foods are rich in a nutrient called arginine, which can cause the virus to multiply. Lifestyle  Do not kiss, have oral sex, or share personal items until your sores heal.  Stress, poor sleep, and being out in the sun can trigger outbreaks. Make sure you: ? Do activities that help you relax, such as deep breathing exercises or meditation. ? Get enough sleep. ? Apply sunscreen on your lips before you go out in the sun. Contact a health care provider if:  You have  symptoms for more than 2 weeks.  You have pus coming from the sores.  You have redness that is spreading.  You have pain or irritation in your eye.  You get sores on your genitals.  Your sores do not heal within 2 weeks.  You have frequent cold sore outbreaks. Get help right away if you have:  A fever and your symptoms suddenly get worse.  A headache and confusion.  Fatigue or loss of appetite.  A stiff neck or sensitivity to light. Summary  A cold sore, also called  a fever blister, is a small, fluid-filled sore that forms inside the mouth or on the lips, gums, nose, chin, or cheeks.  Most cold sores go away on their own without treatment within 2 weeks. Your health care provider may prescribe medicines to help relieve some of the pain, work to stop the virus from multiplying, and shorten healing time.  Wash your hands often. Do not touch your eyes without washing your hands first.  Do not kiss, have oral sex, or share personal items until your sores heal.  Contact a health care provider if your sores do not heal within 2 weeks. This information is not intended to replace advice given to you by your health care provider. Make sure you discuss any questions you have with your health care provider. Document Released: 05/06/2000 Document Revised: 08/29/2018 Document Reviewed: 10/09/2017 Elsevier Patient Education  2020 [employer].
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
## 2019-04-10 LAB — HEPATITIS C ANTIBODY
Hepatitis C Ab: NONREACTIVE
SIGNAL TO CUT-OFF: 0.02 (ref <1.00)

## 2019-04-10 LAB — RPR: RPR Ser Ql: NONREACTIVE

## 2019-04-11 LAB — NOVEL CORONAVIRUS, NAA: SARS-CoV-2, NAA: NOT DETECTED

## 2019-04-11 LAB — HSV(HERPES SMPLX)ABS-I+II(IGG+IGM)-BLD
HSV 1 Glycoprotein G Ab, IgG: 28.2 index — ABNORMAL HIGH (ref 0.00–0.90)
HSV 2 IgG, Type Spec: <0.91 index (ref 0.00–0.90)
HSVI/II Comb IgM: <0.91 Ratio (ref 0.00–0.90)

## 2019-04-12 ENCOUNTER — Other Ambulatory Visit: Payer: Self-pay

## 2019-04-12 ENCOUNTER — Ambulatory Visit: Payer: BC Managed Care – PPO | Admitting: Licensed Clinical Social Worker

## 2019-04-15 LAB — URINE CYTOLOGY ANCILLARY ONLY
Chlamydia: NEGATIVE
Comment: NEGATIVE
Comment: NEGATIVE
Comment: NORMAL
Neisseria Gonorrhea: NEGATIVE
Trichomonas: NEGATIVE

## 2019-04-30 ENCOUNTER — Other Ambulatory Visit: Payer: Self-pay

## 2019-04-30 ENCOUNTER — Encounter: Payer: Self-pay | Admitting: Psychiatry

## 2019-04-30 ENCOUNTER — Ambulatory Visit (INDEPENDENT_AMBULATORY_CARE_PROVIDER_SITE_OTHER): Payer: BC Managed Care – PPO | Admitting: Psychiatry

## 2019-04-30 DIAGNOSIS — F4310 Post-traumatic stress disorder, unspecified: Secondary | ICD-10-CM | POA: Diagnosis not present

## 2019-04-30 DIAGNOSIS — F5105 Insomnia due to other mental disorder: Secondary | ICD-10-CM

## 2019-04-30 DIAGNOSIS — F411 Generalized anxiety disorder: Secondary | ICD-10-CM

## 2019-04-30 NOTE — Progress Notes (Signed)
Virtual Visit via Video Note  I connected with Sergio Garcia on 04/30/19 at  2:00 PM EST by a video enabled telemedicine application and verified that I am speaking with the correct person using two identifiers.   I discussed the limitations of evaluation and management by telemedicine and the availability of in person appointments. The patient expressed understanding and agreed to proceed.     I discussed the assessment and treatment plan with the patient. The patient was provided an opportunity to ask questions and all were answered. The patient agreed with the plan and demonstrated an understanding of the instructions.   The patient was advised to call back or seek an in-person evaluation if the symptoms worsen or if the condition fails to improve as anticipated.   [place] MD OP Progress Note  04/30/2019 6:35 PM DONTAVIS FEIOCK  MRN:  [id number]  Chief Complaint:  Chief Complaint    Follow-up     HPI: Alma is a 43 year old male, employed, lives in [place], has a history of PTSD, GAD, insomnia, GERD, PVC, Gilbert's syndrome, elevated LFT was evaluated by telemedicine today.  Patient today reports he lost his grandfather 2 to 3 weeks ago.  He reports he has struggled the past few weeks however is currently making progress.  He feels much better now.  He has been spending a lot of time with his grandmother and being supportive.  He reports he is compliant on his medications like Prozac and Seroquel.  He denies side effects.  Patient continues to be in psychotherapy sessions however missed his appointment recently.  Patient reports he is motivated to schedule a psychotherapy session soon.  Patient denies any suicidality, homicidality or perceptual disturbances.  Patient denies any other concerns today. Visit Diagnosis:    ICD-10-CM   1. PTSD (post-traumatic stress disorder)  F43.10   2. Generalized anxiety disorder  F41.1   3. Insomnia due to mental condition  F51.05     Past  Psychiatric History: I have reviewed past psychiatric history from my progress note on 09/20/2018.  Past trials of Wellbutrin, Zoloft, Effexor, Klonopin.  Past Medical History:  Past Medical History:  Diagnosis Date  . Anxiety   . Degenerative disc disease   . Depression   . Enteritis   . Gastritis   . GERD (gastroesophageal reflux disease)   . Gilbert's syndrome    hyperbilirubinemia  . IBS (irritable bowel syndrome)   . Inguinal hernia   . Insomnia   . Prostatitis 2008    Past Surgical History:  Procedure Laterality Date  . BACK SURGERY     2 ruptured discs  . CHOLECYSTECTOMY  01/31/2014   GB polyp, chronic cholecystitis   . ESOPHAGOGASTRODUODENOSCOPY  09/2004   gastritis acute and duodenitis  . ESOPHAGOGASTRODUODENOSCOPY  01-14-14   Dr Allen Norris  . EXPLORATORY LAPAROTOMY  Jan 2015   Dr Burt Knack  . LASIK      Family Psychiatric History: I have reviewed family psychiatric history from my progress note on 09/20/2018.  Family History:  Family History  Problem Relation Age of Onset  . Colon polyps Mother   . Cancer Mother        sinus cavity, on XRT  . Heart disease Father   . Heart attack Father        x 2  . Pancreatic cancer Maternal Grandfather   . Colon cancer Maternal Grandfather   . Diabetes Other        strong FH d both sides of  family   . Heart Problems Paternal Grandfather   . Cancer Maternal Uncle   . Stroke Cousin        age 91/44  . Mental illness Neg Hx     Social History: Reviewed social history from my progress note on 09/20/2018. Social History   Socioeconomic History  . Marital status: Legally Separated    Spouse name: Not on file  . Number of children: 2  . Years of education: Not on file  . Highest education level: High school graduate  Occupational History  . Occupation: Music therapist: [employer]  . Financial resource strain: Not hard at all  . Food insecurity    Worry: Never true    Inability: Never true   . Transportation needs    Medical: No    Non-medical: No  Tobacco Use  . Smoking status: Never Smoker  . Smokeless tobacco: Never Used  Substance and Sexual Activity  . Alcohol use: Yes    Comment: occassional social  . Drug use: No  . Sexual activity: Yes  Lifestyle  . Physical activity    Days per week: 0 days    Minutes per session: 0 min  . Stress: Very much  Relationships  . Social Herbalist on phone: Not on file    Gets together: Not on file    Attends religious service: 1 to 4 times per year    Active member of club or organization: No    Attends meetings of clubs or organizations: Never    Relationship status: Separated  Other Topics Concern  . Not on file  Social History Narrative   Married.   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   Enjoys watching racing, Dealer, deer hunting.              Allergies:  Allergies  Allergen Reactions  . Morphine And Related Anaphylaxis  . Morphine Nausea And Vomiting  . Tdap [Tetanus-Diphth-Acell Pertussis] Hives and Rash    Metabolic Disorder Labs: Lab Results  Component Value Date   HGBA1C 6.0 (H) 01/24/2019   No results found for: PROLACTIN Lab Results  Component Value Date   CHOL 173 01/24/2019   TRIG 125 01/24/2019   HDL 43 01/24/2019   CHOLHDL 4.0 01/24/2019   VLDL 16.2 03/02/2018   LDLCALC 108 (H) 01/24/2019   LDLCALC 120 (H) 03/02/2018   Lab Results  Component Value Date   TSH 1.400 01/24/2019   TSH 1.88 03/02/2018    Therapeutic Level Labs: No results found for: LITHIUM No results found for: VALPROATE No components found for:  CBMZ  Current Medications: Current Outpatient Medications  Medication Sig Dispense Refill  . azithromycin (ZITHROMAX) 250 MG tablet 2 pills day 1 and 1 pill day 2-5 with food 6 tablet 0  . cetirizine-pseudoephedrine (ZYRTEC-D) 5-120 MG tablet Take 1 tablet by mouth 2 (two) times daily. 30 tablet 0  . fexofenadine (ALLEGRA  ALLERGY) 180 MG tablet Take 1 tablet (180 mg total) by mouth at bedtime as needed for allergies or rhinitis. 90 tablet 3  . FLUoxetine (PROZAC) 40 MG capsule Take 1 capsule (40 mg total) by mouth daily. 90 capsule 0  . fluticasone (FLONASE) 50 MCG/ACT nasal spray Place 2 sprays into both nostrils daily as needed for allergies or rhinitis. 16 g 11  . pantoprazole (PROTONIX) 40 MG tablet Take 40 mg by mouth  daily.    . QUEtiapine (SEROQUEL) 25 MG tablet Take 1.5 tablets (37.5 mg total) by mouth at bedtime. 135 tablet 0  . sodium chloride (OCEAN) 0.65 % SOLN nasal spray Place 2 sprays into both nostrils as needed for congestion. 30 mL 11  . SUMAtriptan (IMITREX) 50 MG tablet Take 1 tablet (50 mg total) by mouth as needed for migraine. May repeat in 2 hours if headache persists or recurs. No more than 2x per week. No more than 200 mg total/24 hours 9 tablet 2   No current facility-administered medications for this visit.      Musculoskeletal: Strength & Muscle Tone: UTA Gait & Station: normal Patient leans: N/A  Psychiatric Specialty Exam: Review of Systems  Psychiatric/Behavioral: Positive for depression.  All other systems reviewed and are negative.   There were no vitals taken for this visit.There is no height or weight on file to calculate BMI.  General Appearance: Casual  Eye Contact:  Fair  Speech:  Clear and Coherent  Volume:  Normal  Mood:  Depressed grieving the loss of his grandfather  Affect:  Congruent  Thought Process:  Goal Directed and Descriptions of Associations: Intact  Orientation:  Full (Time, Place, and Person)  Thought Content: Logical   Suicidal Thoughts:  No  Homicidal Thoughts:  No  Memory:  Immediate;   Fair Recent;   Fair Remote;   Fair  Judgement:  Fair  Insight:  Fair  Psychomotor Activity:  Normal  Concentration:  Concentration: Fair and Attention Span: Fair  Recall:  [employer] of Knowledge: Fair  Language: Fair  Akathisia:  No  Handed:  Right   AIMS (if indicated): Denies tremors, rigidity  Assets:  Communication Skills Desire for Improvement Social Support  ADL's:  Intact  Cognition: WNL  Sleep:  Fair   Screenings: GAD-7     Office Visit from 01/24/2018 in [facility] from 10/24/2016 in [place] at [facility]  Total GAD-7 Score  14  14    PHQ2-9     Office Visit from 02/21/2019 in [facility] from 01/24/2018 in [facility] from 05/10/2016 in [facility] at [facility] from 11/25/2015 in [facility] Visit from 10/30/2015 in [facility]  PHQ-2 Total Score  0  6  0  0  0  PHQ-9 Total Score  -  19  -  -  -       Assessment and Plan: Joaquin is a 43 year old male, lives in [place], separated, employed, has a history of PTSD, IBS, PVC, Gilbert's syndrome was evaluated by telemedicine today.  Patient is biologically predisposed given his history of trauma.  He also has psychosocial stressors of relationship struggles, legal problems and recent death of his grandfather.  Patient however is currently making progress.  Plan as noted below.  He however will continue to benefit from psychotherapy sessions.  Plan PTSD-stable Prozac 40 mg p.o. daily Continue CBT with Ms. Cecilie Lowers   GAD-stable Prozac as prescribed Seroquel 37.5 mg p.o. nightly Continue CBT  Insomnia-stable Seroquel as prescribed  Patient to continue to work with Ms. Cecilie Lowers for grief counseling.  Follow-up in clinic in 1-2  months or sooner if needed.  January 29 at 10 AM  I have spent atleast 15 minutes non face to face with patient today. More than 50 % of the time was spent for psychoeducation and supportive psychotherapy and care  coordination. This note was generated in part or whole with voice recognition software. Voice recognition is usually quite accurate but there are transcription errors that can and  very often do occur. I apologize for any typographical errors that were not detected and corrected.      Ursula Alert, MD 04/30/2019, 6:35 PM

## 2019-05-20 ENCOUNTER — Other Ambulatory Visit: Payer: BC Managed Care – PPO

## 2019-05-28 ENCOUNTER — Ambulatory Visit: Payer: BC Managed Care – PPO | Admitting: Licensed Clinical Social Worker

## 2019-05-28 ENCOUNTER — Other Ambulatory Visit: Payer: Self-pay

## 2019-05-29 ENCOUNTER — Telehealth: Payer: Self-pay | Admitting: Lab

## 2019-05-29 ENCOUNTER — Ambulatory Visit: Payer: BC Managed Care – PPO | Admitting: Internal Medicine

## 2019-05-29 NOTE — Telephone Encounter (Signed)
Called Pt 3x's for his 11:00am phone visit he never answer. I left a VM on answering machine. Spoke to Santiago Glad at our front end office, and Pt will not be charged today.

## 2019-06-13 ENCOUNTER — Ambulatory Visit: Payer: BC Managed Care – PPO | Admitting: Licensed Clinical Social Worker

## 2019-06-13 ENCOUNTER — Other Ambulatory Visit: Payer: Self-pay

## 2019-06-21 ENCOUNTER — Ambulatory Visit (INDEPENDENT_AMBULATORY_CARE_PROVIDER_SITE_OTHER): Payer: BC Managed Care – PPO | Admitting: Psychiatry

## 2019-06-21 ENCOUNTER — Encounter: Payer: Self-pay | Admitting: Psychiatry

## 2019-06-21 ENCOUNTER — Other Ambulatory Visit: Payer: Self-pay

## 2019-06-21 DIAGNOSIS — F4310 Post-traumatic stress disorder, unspecified: Secondary | ICD-10-CM | POA: Diagnosis not present

## 2019-06-21 DIAGNOSIS — F5105 Insomnia due to other mental disorder: Secondary | ICD-10-CM

## 2019-06-21 DIAGNOSIS — F411 Generalized anxiety disorder: Secondary | ICD-10-CM | POA: Diagnosis not present

## 2019-06-21 MED ORDER — QUETIAPINE FUMARATE 25 MG PO TABS: 37.5000 mg | ORAL_TABLET | Freq: Every day | ORAL | 0 refills | Status: DC

## 2019-06-21 MED ORDER — FLUOXETINE HCL 40 MG PO CAPS: 40.0000 mg | ORAL_CAPSULE | Freq: Every day | ORAL | 0 refills | Status: DC

## 2019-06-21 NOTE — Progress Notes (Signed)
Provider Location : [facility] Patient Location : Home   Virtual Visit via Video Note  I connected with Sergio Garcia on 06/21/19 at 10:00 AM EST by a video enabled telemedicine application and verified that I am speaking with the correct person using two identifiers.   I discussed the limitations of evaluation and management by telemedicine and the availability of in person appointments. The patient expressed understanding and agreed to proceed.    I discussed the assessment and treatment plan with the patient. The patient was provided an opportunity to ask questions and all were answered. The patient agreed with the plan and demonstrated an understanding of the instructions.   The patient was advised to call back or seek an in-person evaluation if the symptoms worsen or if the condition fails to improve as anticipated.   [facility] MD OP Progress Note  06/21/2019 12:26 PM IRIE FIORELLO  MRN:  [id number]  Chief Complaint:  Chief Complaint    Follow-up     HPI: Sergio Garcia is a 44 year old male, employed, lives in [place], has a history of PTSD, GAD, insomnia, GERD, PVC, Gilbert's syndrome, elevated LFT was evaluated by telemedicine today.  Patient today reports he is currently doing well on the current medication regimen.  Patient denies any suicidality, homicidality or perceptual disturbances.  He reports work is going well.  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  He continues to stay safe from COVID-19 and reports he is following precautions.  He has not gotten his COVID-19 vaccination yet.  Patient continues to be in psychotherapy sessions.  He reports therapy sessions as helpful.  He is also talking to a counselor at [facility].  Patient is compliant on medications.  He does report some muscle spasms which last for 30 minutes or so prior to bedtime.  It does not bother him much and he is able to sleep well.  Discussed with patient that it could be due to Seroquel since he  reports it may have started after the dosage was increased.  Discussed with patient about reducing the dosage of Seroquel to 25 mg and monitoring himself.  Also discussed adding Cogentin as needed.  He would like to monitor himself on the reduced dosage for a few weeks and he will let writer know.  Patient denies any other concerns today. Visit Diagnosis:    ICD-10-CM   1. PTSD (post-traumatic stress disorder)  F43.10 FLUoxetine (PROZAC) 40 MG capsule    QUEtiapine (SEROQUEL) 25 MG tablet  2. 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  Past trials of Wellbutrin, Zoloft, Effexor, Klonopin.  Past Medical History:  Past Medical History:  Diagnosis Date  . Anxiety   . Degenerative disc disease   . Depression   . Enteritis   . Gastritis   . GERD (gastroesophageal reflux disease)   . Gilbert's syndrome    hyperbilirubinemia  . IBS (irritable bowel syndrome)   . Inguinal hernia   . Insomnia   . Prostatitis 2008    Past Surgical History:  Procedure Laterality Date  . BACK SURGERY     2 ruptured discs  . CHOLECYSTECTOMY  01/31/2014   GB polyp, chronic cholecystitis   . ESOPHAGOGASTRODUODENOSCOPY  09/2004   gastritis acute and  duodenitis  . ESOPHAGOGASTRODUODENOSCOPY  01-14-14   Dr Allen Norris  . EXPLORATORY LAPAROTOMY  Jan 2015   Dr Burt Knack  . LASIK      Family Psychiatric History: I have reviewed family psychiatric history from my progress note on 09/20/2018.  Family History:  Family History  Problem Relation Age of Onset  . Colon polyps Mother   . Cancer Mother        sinus cavity, on XRT  . Heart disease Father   . Heart attack Father        x 2  . Pancreatic cancer Maternal Grandfather   . Colon cancer Maternal Grandfather   .  Diabetes Other        strong FH d both sides of family   . Heart Problems Paternal Grandfather   . Cancer Maternal Uncle   . Stroke Cousin        age 3/44  . Mental illness Neg Hx     Social History: Reviewed social history from my progress note on 09/20/2018. Social History   Socioeconomic History  . Marital status: Legally Separated    Spouse name: Not on file  . Number of children: 2  . Years of education: Not on file  . Highest education level: High school graduate  Occupational History  . Occupation: Music therapist: [employer]  Tobacco Use  . Smoking status: Never Smoker  . Smokeless tobacco: Never Used  Substance and Sexual Activity  . Alcohol use: Yes    Comment: occassional social  . Drug use: No  . Sexual activity: Yes  Other Topics Concern  . Not on file  Social History Narrative   Married.   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   Enjoys watching racing, Dealer, deer hunting.             Social Determinants of Health   Financial Resource Strain: Low Risk   . Difficulty of Paying Living Expenses: Not hard at all  Food Insecurity: No Food Insecurity  . Worried About Charity fundraiser in the Last Year: Never true  . Ran Out of Food in the Last Year: Never true  Transportation Needs: No Transportation Needs  . Lack of Transportation (Medical): No  . Lack of Transportation (Non-Medical): No  Physical Activity: Inactive  . Days of Exercise per Week: 0 days  . Minutes of Exercise per Session: 0 min  Stress: Stress Concern Present  . Feeling of Stress : Very much  Social Connections: Unknown  . Frequency of Communication with Friends and Family: Not on file  . Frequency of Social Gatherings with Friends and Family: Not on file  . Attends Religious Services: 1 to 4 times per year  . Active Member of Clubs or Organizations: No  . Attends Archivist Meetings: Never  . Marital Status: Separated     Allergies:  Allergies  Allergen Reactions  . Morphine And Related Anaphylaxis  . Morphine Nausea And Vomiting  . Tdap [Tetanus-Diphth-Acell Pertussis] Hives and Rash    Metabolic Disorder Labs: Lab Results  Component Value Date   HGBA1C 6.0 (H) 01/24/2019   No results found for: PROLACTIN Lab Results  Component Value Date   CHOL 173 01/24/2019   TRIG 125 01/24/2019   HDL 43 01/24/2019   CHOLHDL 4.0 01/24/2019   VLDL 16.2 03/02/2018   LDLCALC 108 (H) 01/24/2019   LDLCALC 120 (H) 03/02/2018  Lab Results  Component Value Date   TSH 1.400 01/24/2019   TSH 1.88 03/02/2018    Therapeutic Level Labs: No results found for: LITHIUM No results found for: VALPROATE No components found for:  CBMZ  Current Medications: Current Outpatient Medications  Medication Sig Dispense Refill  . azithromycin (ZITHROMAX) 250 MG tablet 2 pills day 1 and 1 pill day 2-5 with food 6 tablet 0  . cetirizine-pseudoephedrine (ZYRTEC-D) 5-120 MG tablet Take 1 tablet by mouth 2 (two) times daily. 30 tablet 0  . fexofenadine (ALLEGRA ALLERGY) 180 MG tablet Take 1 tablet (180 mg total) by mouth at bedtime as needed for allergies or rhinitis. 90 tablet 3  . FLUoxetine (PROZAC) 40 MG capsule Take 1 capsule (40 mg total) by mouth daily. 90 capsule 0  . fluticasone (FLONASE) 50 MCG/ACT nasal spray Place 2 sprays into both nostrils daily as needed for allergies or rhinitis. 16 g 11  . ID NOW COVID-19 KIT See admin instructions. for testing    . pantoprazole (PROTONIX) 40 MG tablet Take 40 mg by mouth daily.    . QUEtiapine (SEROQUEL) 25 MG tablet Take 1.5 tablets (37.5 mg total) by mouth at bedtime. 135 tablet 0  . sodium chloride (OCEAN) 0.65 % SOLN nasal spray Place 2 sprays into both nostrils as needed for congestion. 30 mL 11  . SUMAtriptan (IMITREX) 50 MG tablet Take 1 tablet (50 mg total) by mouth as needed for migraine. May repeat in 2 hours if headache persists or recurs. No more than 2x per week.  No more than 200 mg total/24 hours 9 tablet 2   No current facility-administered medications for this visit.     Musculoskeletal: Strength & Muscle Tone: UTA Gait & Station: normal Patient leans: N/A  Psychiatric Specialty Exam: Review of Systems  Musculoskeletal:       Muscle spasms  Psychiatric/Behavioral: Negative for agitation, behavioral problems, confusion, decreased concentration, dysphoric mood, hallucinations, self-injury, sleep disturbance and suicidal ideas. The patient is not nervous/anxious and is not hyperactive.   All other systems reviewed and are negative.   There were no vitals taken for this visit.There is no height or weight on file to calculate BMI.  General Appearance: Casual  Eye Contact:  Fair  Speech:  Clear and Coherent  Volume:  Normal  Mood:  Euthymic  Affect:  Congruent  Thought Process:  Goal Directed and Descriptions of Associations: Intact  Orientation:  Full (Time, Place, and Person)  Thought Content: Logical   Suicidal Thoughts:  No  Homicidal Thoughts:  No  Memory:  Immediate;   Fair Recent;   Fair Remote;   Fair  Judgement:  Fair  Insight:  Fair  Psychomotor Activity:  Normal  Concentration:  Concentration: Fair and Attention Span: Fair  Recall:  [employer] of Knowledge: Fair  Language: Fair  Akathisia:  No  Handed:  Right  AIMS (if indicated): reports muscle spasms prior to bedtime - does not bother him much  Assets:  Communication Skills Desire for Improvement Housing Social Support  ADL's:  Intact  Cognition: WNL  Sleep:  Fair   Screenings: GAD-7     Office Visit from 01/24/2018 in [facility] from 10/24/2016 in [place] at [facility]  Total GAD-7 Score  14  14    PHQ2-9     Office Visit from 02/21/2019 in [facility] Visit from 01/24/2018 in [facility] Office Visit from 05/10/2016 in [place]  at [facility] from 11/25/2015 in  [facility] Office Visit from 10/30/2015 in [facility]  PHQ-2 Total Score  0  6  0  0  0  PHQ-9 Total Score  --  19  --  --  --       Assessment and Plan: Joao is a 44 year old male, lives in [place], separated, employed, has a history of PTSD, IBS, PVC, Gilbert's syndrome was evaluated by telemedicine today.  Patient is biologically predisposed given his history of trauma.  He also has psychosocial stressors of relationship struggles, legal problems and recent death of his grandfather.  Patient is currently doing well on the current medication regimen.  Plan as noted below.  Plan PTSD-stable Prozac 40 mg p.o. daily Continue CBT with Ms. Cecilie Lowers.  GAD-stable Prozac as prescribed Seroquel 25 to 37.5 mg p.o. nightly.  Patient advised to reduce it to Seroquel 25 mg and monitor his muscle spasms. Continue CBT  Insomnia-stable Seroquel as prescribed  Patient to continue to work with his therapist.  Follow-up in clinic in 1 month or sooner if needed.  March 9 at 4:20 PM  I have spent atleast 20 minutes non face to face with patient today. More than 50 % of the time was spent for ordering medications and test ,psychoeducation and supportive psychotherapy and care coordination,as well as documenting clinical information in electronic health record. This note was generated in part or whole with voice recognition software. Voice recognition is usually quite accurate but there are transcription errors that can and very often do occur. I apologize for any typographical errors that were not detected and corrected.      Ursula Alert, MD 06/21/2019, 12:26 PM

## 2019-07-30 ENCOUNTER — Other Ambulatory Visit: Payer: Self-pay

## 2019-07-30 ENCOUNTER — Encounter: Payer: Self-pay | Admitting: Psychiatry

## 2019-07-30 ENCOUNTER — Ambulatory Visit (INDEPENDENT_AMBULATORY_CARE_PROVIDER_SITE_OTHER): Payer: BC Managed Care – PPO | Admitting: Psychiatry

## 2019-07-30 DIAGNOSIS — F5105 Insomnia due to other mental disorder: Secondary | ICD-10-CM

## 2019-07-30 DIAGNOSIS — F4310 Post-traumatic stress disorder, unspecified: Secondary | ICD-10-CM

## 2019-07-30 DIAGNOSIS — F411 Generalized anxiety disorder: Secondary | ICD-10-CM

## 2019-07-30 MED ORDER — CLONAZEPAM 0.5 MG PO TABS: 0.5000 mg | ORAL_TABLET | ORAL | 0 refills | Status: DC

## 2019-07-30 NOTE — Progress Notes (Signed)
Provider Location : [facility] Patient Location : Home  Virtual Visit via Video Note  I connected with Sergio Garcia on 07/30/19 at  4:20 PM EST by a video enabled telemedicine application and verified that I am speaking with the correct person using two identifiers.   I discussed the limitations of evaluation and management by telemedicine and the availability of in person appointments. The patient expressed understanding and agreed to proceed.    I discussed the assessment and treatment plan with the patient. The patient was provided an opportunity to ask questions and all were answered. The patient agreed with the plan and demonstrated an understanding of the instructions.   The patient was advised to call back or seek an in-person evaluation if the symptoms worsen or if the condition fails to improve as anticipated.   [facility] MD OP Progress Note  07/30/2019 5:05 PM Sergio Garcia  MRN:  [id number]  Chief Complaint:  Chief Complaint    Follow-up     HPI: Sergio Garcia is a 44 year old male, employed, lives in [place], has a history of PTSD, GAD, insomnia, GERD, PVC, Gilbert's syndrome, elevated LFT was evaluated by telemedicine today.  Patient today reports he does have ups and downs in his mood however overall it is better.  He reports his medications as beneficial.  He however reports he just went through a break-up this morning.  He reports his girlfriend of the past 4 months just left him.  He reports she felt she could not deal with his mood symptoms.  Patient however reports he does not know why she may have felt that way since everyone else around him has noticed a big difference in him.  Patient reports his supervisor at work also recently called him in his office and told him about how much progress he has been making and that he is going in the right direction.  Patient denies any suicidality, homicidality or perceptual disturbances.  Patient reports he continues to be in psychotherapy  sessions with Ms. Alden Hipp and he is motivated to keep it.  Patient denies any other concerns today. Visit Diagnosis:    ICD-10-CM   1. PTSD (post-traumatic stress disorder)  F43.10 clonazePAM (KLONOPIN) 0.5 MG tablet  2. 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  Past trials of Wellbutrin, Zoloft, Effexor, Klonopin  Past Medical History:  Past Medical History:  Diagnosis Date  . Anxiety   . Degenerative disc disease   . Depression   . Enteritis   . Gastritis   . GERD (gastroesophageal reflux disease)   . Gilbert's syndrome    hyperbilirubinemia  . IBS (irritable bowel syndrome)   . Inguinal hernia   . Insomnia   . Prostatitis 2008    Past Surgical History:  Procedure Laterality Date  . BACK SURGERY     2 ruptured discs  . CHOLECYSTECTOMY  01/31/2014   GB polyp, chronic cholecystitis   . ESOPHAGOGASTRODUODENOSCOPY  09/2004   gastritis acute and duodenitis  . ESOPHAGOGASTRODUODENOSCOPY  01-14-14   Dr Allen Norris  . EXPLORATORY LAPAROTOMY  Jan 2015   Dr Burt Knack  . LASIK      Family Psychiatric History: I have reviewed family psychiatric history from my progress note on 09/20/2018  Family History:  Family History  Problem Relation Age of Onset  . Colon  polyps Mother   . Cancer Mother        sinus cavity, on XRT  . Heart disease Father   . Heart attack Father        x 2  . Pancreatic cancer Maternal Grandfather   . Colon cancer Maternal Grandfather   . Diabetes Other        strong FH d both sides of family   . Heart Problems Paternal Grandfather   . Cancer Maternal Uncle   . Stroke Cousin        age 83/44  . Mental illness Neg Hx     Social History: I have reviewed social history from my progress note on 09/20/2018 Social History   Socioeconomic History  . Marital status: Legally  Separated    Spouse name: Not on file  . Number of children: 2  . Years of education: Not on file  . Highest education level: High school graduate  Occupational History  . Occupation: Music therapist: [employer]  Tobacco Use  . Smoking status: Never Smoker  . Smokeless tobacco: Never Used  Substance and Sexual Activity  . Alcohol use: Yes    Comment: occassional social  . Drug use: No  . Sexual activity: Yes  Other Topics Concern  . Not on file  Social History Narrative   Married.   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   Enjoys watching racing, Dealer, deer hunting.             Social Determinants of Health   Financial Resource Strain: Low Risk   . Difficulty of Paying Living Expenses: Not hard at all  Food Insecurity: No Food Insecurity  . Worried About Charity fundraiser in the Last Year: Never true  . Ran Out of Food in the Last Year: Never true  Transportation Needs: No Transportation Needs  . Lack of Transportation (Medical): No  . Lack of Transportation (Non-Medical): No  Physical Activity: Inactive  . Days of Exercise per Week: 0 days  . Minutes of Exercise per Session: 0 min  Stress: Stress Concern Present  . Feeling of Stress : Very much  Social Connections: Unknown  . Frequency of Communication with Friends and Family: Not on file  . Frequency of Social Gatherings with Friends and Family: Not on file  . Attends Religious Services: 1 to 4 times per year  . Active Member of Clubs or Organizations: No  . Attends Archivist Meetings: Never  . Marital Status: Separated    Allergies:  Allergies  Allergen Reactions  . Morphine And Related Anaphylaxis  . Morphine Nausea And Vomiting  . Tdap [Tetanus-Diphth-Acell Pertussis] Hives and Rash    Metabolic Disorder Labs: Lab Results  Component Value Date   HGBA1C 6.0 (H) 01/24/2019   No results found for: PROLACTIN Lab Results  Component Value  Date   CHOL 173 01/24/2019   TRIG 125 01/24/2019   HDL 43 01/24/2019   CHOLHDL 4.0 01/24/2019   VLDL 16.2 03/02/2018   LDLCALC 108 (H) 01/24/2019   LDLCALC 120 (H) 03/02/2018   Lab Results  Component Value Date   TSH 1.400 01/24/2019   TSH 1.88 03/02/2018    Therapeutic Level Labs: No results found for: LITHIUM No results found for: VALPROATE No components found for:  CBMZ  Current Medications: Current Outpatient Medications  Medication Sig Dispense Refill  . azithromycin (ZITHROMAX) 250 MG tablet  2 pills day 1 and 1 pill day 2-5 with food 6 tablet 0  . cetirizine-pseudoephedrine (ZYRTEC-D) 5-120 MG tablet Take 1 tablet by mouth 2 (two) times daily. 30 tablet 0  . clonazePAM (KLONOPIN) 0.5 MG tablet Take 1 tablet (0.5 mg total) by mouth as directed. Take 1 tablet once a day up to 1-2 times a week if needed only for severe anxiety attacks 10 tablet 0  . fexofenadine (ALLEGRA ALLERGY) 180 MG tablet Take 1 tablet (180 mg total) by mouth at bedtime as needed for allergies or rhinitis. 90 tablet 3  . FLUoxetine (PROZAC) 40 MG capsule Take 1 capsule (40 mg total) by mouth daily. 90 capsule 0  . fluticasone (FLONASE) 50 MCG/ACT nasal spray Place 2 sprays into both nostrils daily as needed for allergies or rhinitis. 16 g 11  . ID NOW COVID-19 KIT See admin instructions. for testing    . pantoprazole (PROTONIX) 40 MG tablet Take 40 mg by mouth daily.    . QUEtiapine (SEROQUEL) 25 MG tablet Take 1.5 tablets (37.5 mg total) by mouth at bedtime. 135 tablet 0  . sodium chloride (OCEAN) 0.65 % SOLN nasal spray Place 2 sprays into both nostrils as needed for congestion. 30 mL 11  . SUMAtriptan (IMITREX) 50 MG tablet Take 1 tablet (50 mg total) by mouth as needed for migraine. May repeat in 2 hours if headache persists or recurs. No more than 2x per week. No more than 200 mg total/24 hours 9 tablet 2   No current facility-administered medications for this visit.     Musculoskeletal: Strength  & Muscle Tone: UTA Gait & Station: normal Patient leans: N/A  Psychiatric Specialty Exam: Review of Systems  Psychiatric/Behavioral: The patient is nervous/anxious.   All other systems reviewed and are negative.   There were no vitals taken for this visit.There is no height or weight on file to calculate BMI.  General Appearance: Casual  Eye Contact:  Fair  Speech:  Clear and Coherent  Volume:  Normal  Mood:  Anxious  Affect:  Congruent  Thought Process:  Goal Directed and Descriptions of Associations: Intact  Orientation:  Full (Time, Place, and Person)  Thought Content: Logical   Suicidal Thoughts:  No  Homicidal Thoughts:  No  Memory:  Immediate;   Fair Recent;   Fair Remote;   Fair  Judgement:  Fair  Insight:  Fair  Psychomotor Activity:  Normal  Concentration:  Concentration: Fair and Attention Span: Fair  Recall:  [employer] of Knowledge: Fair  Language: Fair  Akathisia:  No  Handed:  Right  AIMS (if indicated): UTA  Assets:  Communication Skills Desire for Improvement Housing Social Support  ADL's:  Intact  Cognition: WNL  Sleep:  Fair   Screenings: GAD-7     Office Visit from 01/24/2018 in [facility] from 10/24/2016 in [facility] at [facility]  Total GAD-7 Score  14  14    PHQ2-9     Office Visit from 02/21/2019 in [facility] Visit from 01/24/2018 in [facility] Office Visit from 05/10/2016 in [facility] at [facility] from 11/25/2015 in [facility] from 10/30/2015 in [facility]  PHQ-2 Total Score  0  6  0  0  0  PHQ-9 Total Score  --  19  --  --  --       Assessment and Plan: Sergio Garcia is a  44 year old male, lives in [place], separated, employed, has a history of PTSD, IBS, PVC, Gilbert's syndrome was evaluated by telemedicine today.  Patient is biologically predisposed given his history of trauma.  He also has  psychosocial stressors of relationship struggles, legal problems and recent death of his grandfather and break-up with his girlfriend today.  Patient will benefit from the following plan.  Plan PTSD-stable Prozac 40 mg p.o. daily.  Patient is not interested in increasing his dosage and reports it is beneficial. Continue CBT with Ms. Cecilie Lowers  GAD-stable Prozac as prescribed Seroquel 25 to 37.5 mg p.o. nightly Add Klonopin 0.5 mg as needed for severe anxiety attacks-1-2 times a week if needed since he currently has situational stressors.  Patient advised to limit use.  Discussed the long-term adverse side effects of being on benzodiazepine therapy. Continue CBT  Insomnia-stable Seroquel as prescribed  Follow-up in clinic in 4 weeks or sooner if needed.  I have spent atleast 20 minutes non face to face with patient today. More than 50 % of the time was spent for ordering medications and test ,psychoeducation and supportive psychotherapy and care coordination,as well as documenting clinical information in electronic health record. This note was generated in part or whole with voice recognition software. Voice recognition is usually quite accurate but there are transcription errors that can and very often do occur. I apologize for any typographical errors that were not detected and corrected.        Ursula Alert, MD 07/30/2019, 5:05 PM

## 2019-08-01 ENCOUNTER — Encounter: Payer: Self-pay | Admitting: Licensed Clinical Social Worker

## 2019-08-01 ENCOUNTER — Ambulatory Visit (INDEPENDENT_AMBULATORY_CARE_PROVIDER_SITE_OTHER): Payer: BC Managed Care – PPO | Admitting: Licensed Clinical Social Worker

## 2019-08-01 ENCOUNTER — Other Ambulatory Visit: Payer: Self-pay

## 2019-08-01 DIAGNOSIS — F4310 Post-traumatic stress disorder, unspecified: Secondary | ICD-10-CM | POA: Diagnosis not present

## 2019-08-01 DIAGNOSIS — F411 Generalized anxiety disorder: Secondary | ICD-10-CM
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
## 2019-08-26 ENCOUNTER — Encounter: Payer: Self-pay | Admitting: Psychiatry

## 2019-08-26 ENCOUNTER — Other Ambulatory Visit: Payer: Self-pay

## 2019-08-26 ENCOUNTER — Ambulatory Visit (INDEPENDENT_AMBULATORY_CARE_PROVIDER_SITE_OTHER): Payer: BC Managed Care – PPO | Admitting: Psychiatry

## 2019-08-26 DIAGNOSIS — F4310 Post-traumatic stress disorder, unspecified: Secondary | ICD-10-CM | POA: Diagnosis not present

## 2019-08-26 DIAGNOSIS — F411 Generalized anxiety disorder: Secondary | ICD-10-CM | POA: Diagnosis not present

## 2019-08-26 DIAGNOSIS — F5105 Insomnia due to other mental disorder: Secondary | ICD-10-CM

## 2019-08-26 MED ORDER — FLUOXETINE HCL 40 MG PO CAPS: 40.0000 mg | ORAL_CAPSULE | Freq: Every day | ORAL | 0 refills | Status: DC

## 2019-08-26 MED ORDER — QUETIAPINE FUMARATE 25 MG PO TABS: 37.5000 mg | ORAL_TABLET | Freq: Every day | ORAL | 0 refills | Status: DC

## 2019-08-26 NOTE — Progress Notes (Signed)
Provider Location : [facility] Patient Location : Work  Virtual Visit via Video Note  I connected with Sergio Garcia on 08/26/19 at  1:30 PM EDT by a video enabled telemedicine application and verified that I am speaking with the correct person using two identifiers.   I discussed the limitations of evaluation and management by telemedicine and the availability of in person appointments. The patient expressed understanding and agreed to proceed.      I discussed the assessment and treatment plan with the patient. The patient was provided an opportunity to ask questions and all were answered. The patient agreed with the plan and demonstrated an understanding of the instructions.   The patient was advised to call back or seek an in-person evaluation if the symptoms worsen or if the condition fails to improve as anticipated.   [facility] MD OP Progress Note  08/26/2019 5:37 PM Sergio Garcia  MRN:  [id number]  Chief Complaint:  Chief Complaint    Follow-up     HPI: Sergio Garcia is a 44 year old male, employed, lives in [place], has a history of PTSD, GAD, insomnia, GERD, PVC, Gilbert's syndrome, elevated LFT was evaluated by telemedicine today.  Patient today reports he is currently doing well on the current medication regimen.  He is compliant on the medications and denies side effects.  He reports sleep as good.  The Seroquel does help.  Patient denies any appetite changes.  Patient denies any suicidality, homicidality or perceptual disturbances.  He continues to follow-up with his therapist Ms. Alden Hipp.  Therapy sessions are going well.  Patient reports he was able to take a break this past weekend and [place]  That really helped him.  He is also coping with his recent break-up well and reports it does not affect him and is kind of a relief for him rather.  Patient denies any other concerns today. Visit Diagnosis:    ICD-10-CM   1. PTSD  (post-traumatic stress disorder)  F43.10 FLUoxetine (PROZAC) 40 MG capsule    QUEtiapine (SEROQUEL) 25 MG tablet  2. 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  Past trials of Wellbutrin, Zoloft, Effexor, Klonopin  Past Medical History:  Past Medical History:  Diagnosis Date  . Anxiety   . Degenerative disc disease   . Depression   . Enteritis   . Gastritis   . GERD (gastroesophageal reflux disease)   . Gilbert's syndrome    hyperbilirubinemia  . IBS (irritable bowel syndrome)   . Inguinal hernia   . Insomnia   . Prostatitis 2008    Past Surgical History:  Procedure Laterality Date  . BACK SURGERY     2 ruptured discs  . CHOLECYSTECTOMY  01/31/2014   GB polyp, chronic cholecystitis   . ESOPHAGOGASTRODUODENOSCOPY  09/2004   gastritis acute and duodenitis  . ESOPHAGOGASTRODUODENOSCOPY  01-14-14   Dr Allen Norris  . EXPLORATORY LAPAROTOMY  Jan 2015   Dr Burt Knack  . LASIK      Family Psychiatric History: I have reviewed family psychiatric history from my progress note on 09/20/2018.  Family History:  Family History  Problem Relation Age of Onset  . Colon polyps Mother   . Cancer Mother  sinus cavity, on XRT  . Heart disease Father   . Heart attack Father        x 2  . Pancreatic cancer Maternal Grandfather   . Colon cancer Maternal Grandfather   . Diabetes Other        strong FH d both sides of family   . Heart Problems Paternal Grandfather   . Cancer Maternal Uncle   . Stroke Cousin        age 60/44  . Mental illness Neg Hx     Social History: I have reviewed social history from my progress note on 09/20/2018. Social History   Socioeconomic History  . Marital status: Legally Separated    Spouse name: Not on file   . Number of children: 2  . Years of education: Not on file  . Highest education level: High school graduate  Occupational History  . Occupation: Music therapist: [employer]  Tobacco Use  . Smoking status: Never Smoker  . Smokeless tobacco: Never Used  Substance and Sexual Activity  . Alcohol use: Yes    Comment: occassional social  . Drug use: No  . Sexual activity: Yes  Other Topics Concern  . Not on file  Social History Narrative   Married.   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   Enjoys watching racing, Dealer, deer hunting.             Social Determinants of Health   Financial Resource Strain: Low Risk   . Difficulty of Paying Living Expenses: Not hard at all  Food Insecurity: No Food Insecurity  . Worried About Charity fundraiser in the Last Year: Never true  . Ran Out of Food in the Last Year: Never true  Transportation Needs: No Transportation Needs  . Lack of Transportation (Medical): No  . Lack of Transportation (Non-Medical): No  Physical Activity: Inactive  . Days of Exercise per Week: 0 days  . Minutes of Exercise per Session: 0 min  Stress: Stress Concern Present  . Feeling of Stress : Very much  Social Connections: Unknown  . Frequency of Communication with Friends and Family: Not on file  . Frequency of Social Gatherings with Friends and Family: Not on file  . Attends Religious Services: 1 to 4 times per year  . Active Member of Clubs or Organizations: No  . Attends Archivist Meetings: Never  . Marital Status: Separated    Allergies:  Allergies  Allergen Reactions  . Morphine And Related Anaphylaxis  . Morphine Nausea And Vomiting  . Tdap [Tetanus-Diphth-Acell Pertussis] Hives and Rash    Metabolic Disorder Labs: Lab Results  Component Value Date   HGBA1C 6.0 (H) 01/24/2019   No results found for: PROLACTIN Lab Results  Component Value Date   CHOL 173 01/24/2019   TRIG 125  01/24/2019   HDL 43 01/24/2019   CHOLHDL 4.0 01/24/2019   VLDL 16.2 03/02/2018   LDLCALC 108 (H) 01/24/2019   LDLCALC 120 (H) 03/02/2018   Lab Results  Component Value Date   TSH 1.400 01/24/2019   TSH 1.88 03/02/2018    Therapeutic Level Labs: No results found for: LITHIUM No results found for: VALPROATE No components found for:  CBMZ  Current Medications: Current Outpatient Medications  Medication Sig Dispense Refill  . clonazePAM (KLONOPIN) 0.5 MG tablet Take 1 tablet (0.5 mg total) by mouth as directed. Take 1 tablet once  a day up to 1-2 times a week if needed only for severe anxiety attacks 10 tablet 0  . FLUoxetine (PROZAC) 40 MG capsule Take 1 capsule (40 mg total) by mouth daily. 90 capsule 0  . fluticasone (FLONASE) 50 MCG/ACT nasal spray Place 2 sprays into both nostrils daily as needed for allergies or rhinitis. 16 g 11  . ID NOW COVID-19 KIT See admin instructions. for testing    . pantoprazole (PROTONIX) 40 MG tablet Take 40 mg by mouth daily.    . QUEtiapine (SEROQUEL) 25 MG tablet Take 1.5 tablets (37.5 mg total) by mouth at bedtime. 135 tablet 0  . sodium chloride (OCEAN) 0.65 % SOLN nasal spray Place 2 sprays into both nostrils as needed for congestion. 30 mL 11  . SUMAtriptan (IMITREX) 50 MG tablet Take 1 tablet (50 mg total) by mouth as needed for migraine. May repeat in 2 hours if headache persists or recurs. No more than 2x per week. No more than 200 mg total/24 hours 9 tablet 2  . azithromycin (ZITHROMAX) 250 MG tablet 2 pills day 1 and 1 pill day 2-5 with food (Patient not taking: Reported on 08/26/2019) 6 tablet 0  . cetirizine-pseudoephedrine (ZYRTEC-D) 5-120 MG tablet Take 1 tablet by mouth 2 (two) times daily. (Patient not taking: Reported on 08/26/2019) 30 tablet 0  . fexofenadine (ALLEGRA ALLERGY) 180 MG tablet Take 1 tablet (180 mg total) by mouth at bedtime as needed for allergies or rhinitis. (Patient not taking: Reported on 08/26/2019) 90 tablet 3   No  current facility-administered medications for this visit.     Musculoskeletal: Strength & Muscle Tone: UTA Gait & Station: normal Patient leans: N/A  Psychiatric Specialty Exam: Review of Systems  Psychiatric/Behavioral: Negative for agitation, behavioral problems, confusion, decreased concentration, dysphoric mood, hallucinations, self-injury, sleep disturbance and suicidal ideas. The patient is not nervous/anxious and is not hyperactive.   All other systems reviewed and are negative.   There were no vitals taken for this visit.There is no height or weight on file to calculate BMI.  General Appearance: Casual  Eye Contact:  Fair  Speech:  Clear and Coherent  Volume:  Normal  Mood:  Euthymic  Affect:  Congruent  Thought Process:  Goal Directed and Descriptions of Associations: Intact  Orientation:  Full (Time, Place, and Person)  Thought Content: Logical   Suicidal Thoughts:  No  Homicidal Thoughts:  No  Memory:  Immediate;   Fair Recent;   Fair Remote;   Fair  Judgement:  Fair  Insight:  Fair  Psychomotor Activity:  Normal  Concentration:  Concentration: Fair and Attention Span: Fair  Recall:  [employer] of Knowledge: Fair  Language: Fair  Akathisia:  No  Handed:  Right  AIMS (if indicated): UTA  Assets:  Communication Skills Desire for Improvement Housing Social Support  ADL's:  Intact  Cognition: WNL  Sleep:  Fair   Screenings: GAD-7     Office Visit from 01/24/2018 in [facility] Visit from 10/24/2016 in [place] at [facility]  Total GAD-7 Score  14  14    PHQ2-9     Office Visit from 02/21/2019 in [facility] Visit from 01/24/2018 in [facility] Office Visit from 05/10/2016 in [facility] from 11/25/2015 in [facility] Visit from 10/30/2015 in [facility]  PHQ-2 Total Score  0  6  0  0  0  PHQ-9 Total Score  -  19   -  -  -       Assessment and Plan: Kevontay is a 44 year old male, lives in [place], separated, employed, has a history of PTSD, IBS, PVC, Gilbert's syndrome was evaluated by telemedicine today.  Patient is biologically predisposed given his history of trauma.  Patient also has relationship struggles, legal problems, death in his family.  Patient however is doing well on the current medication regimen and will continue to benefit from psychotherapy sessions.  Plan as noted below.  Plan PTSD-stable Prozac 40 mg p.o. daily.  Patient is not interested in increasing his dosage and report it is beneficial. Continue CBT with Ms. Cecilie Lowers  GAD-stable Prozac as prescribed Seroquel 25 to 37.5 mg p.o. nightly Klonopin 0.5 mg as needed for severe anxiety attacks-1-2 times a week if needed.  He has been limiting use. Continue CBT  Insomnia-stable Seroquel as prescribed  Follow-up in clinic in 1 month or sooner if needed.  I have spent atleast 20 minutes non face to face with patient today. More than 50 % of the time was spent for preparing to see the patient ( e.g., review of test, records ), ordering medications and test ,psychoeducation and supportive psychotherapy and care coordination,as well as documenting clinical information in electronic health record. This note was generated in part or whole with voice recognition software. Voice recognition is usually quite accurate but there are transcription errors that can and very often do occur. I apologize for any typographical errors that were not detected and corrected.      Ursula Alert, MD 08/26/2019, 5:37 PM

## 2019-09-02 ENCOUNTER — Encounter: Payer: Self-pay | Admitting: Licensed Clinical Social Worker

## 2019-09-02 ENCOUNTER — Other Ambulatory Visit: Payer: Self-pay

## 2019-09-02 ENCOUNTER — Ambulatory Visit (INDEPENDENT_AMBULATORY_CARE_PROVIDER_SITE_OTHER): Payer: BC Managed Care – PPO | Admitting: Licensed Clinical Social Worker

## 2019-09-02 DIAGNOSIS — F4310 Post-traumatic stress disorder, unspecified: Secondary | ICD-10-CM

## 2019-09-02 NOTE — Progress Notes (Signed)
  Virtual Visit via Video Note  I connected with Sergio Garcia on 09/02/19 at 10:00 AM EDT by a video enabled telemedicine application and verified that I am speaking with the correct person using two identifiers.   I discussed the limitations of evaluation and management by telemedicine and the availability of in person appointments. The patient expressed understanding and agreed to proceed.   I discussed the assessment and treatment plan with the patient. The patient was provided an opportunity to ask questions and all were answered. The patient agreed with the plan and demonstrated an understanding of the instructions.   The patient was advised to call back or seek an in-person evaluation if the symptoms worsen or if the condition fails to improve as anticipated.  I provided 30 minutes of non-face-to-face time during this encounter.   Alden Hipp, LCSW   THERAPIST PROGRESS NOTE  Session Time: 1000  Participation Level: Active  Behavioral Response: Well GroomedAlertAnxious  Type of Therapy: Individual Therapy  Treatment Goals addressed: Anxiety  Interventions: Supportive  Summary: Sergio ANDRZEJEWSKI is a 44 y.o. male who presents with continued symptoms related to his diagnosis. Kipper reports doing well since our last session, and notes he has continued to feel a decrease in his anxiety and depression symptoms following his most recent break up. LCSW validated Sergio Garcia's experience, and encouraged him to pay attention to his instincts moving forward, especially in relationships, as he has ignored red flags in the past which has landed him in the current situation. We discussed ways to spot warning signs, and what Sergio Garcia could do to ensure he listens to himself moving forward. Roane expressed understanding and agreement with all information presented during today's session.    Suicidal/Homicidal: No  Therapist Response: Nahir continues to work towards his tx goals but has not yet reached  them. We will continue to work on improving emotional regulation skills moving forward.   Plan: Return again in 4 weeks.  Diagnosis: Axis I: Post Traumatic Stress Disorder    Axis II: No diagnosis    Alden Hipp, LCSW 09/02/2019

## 2019-09-24 ENCOUNTER — Other Ambulatory Visit: Payer: Self-pay

## 2019-09-24 ENCOUNTER — Telehealth (INDEPENDENT_AMBULATORY_CARE_PROVIDER_SITE_OTHER): Payer: BC Managed Care – PPO | Admitting: Psychiatry

## 2019-09-24 ENCOUNTER — Encounter: Payer: Self-pay | Admitting: Psychiatry

## 2019-09-24 DIAGNOSIS — F411 Generalized anxiety disorder: Secondary | ICD-10-CM

## 2019-09-24 DIAGNOSIS — F5105 Insomnia due to other mental disorder: Secondary | ICD-10-CM

## 2019-09-24 DIAGNOSIS — F4310 Post-traumatic stress disorder, unspecified: Secondary | ICD-10-CM

## 2019-09-24 NOTE — Progress Notes (Signed)
Provider Location : [facility] Patient Location : Home  Virtual Visit via Video Note  I connected with Sergio Garcia on 09/24/19 at  2:40 PM EDT by a video enabled telemedicine application and verified that I am speaking with the correct person using two identifiers.   I discussed the limitations of evaluation and management by telemedicine and the availability of in person appointments. The patient expressed understanding and agreed to proceed.    I discussed the assessment and treatment plan with the patient. The patient was provided an opportunity to ask questions and all were answered. The patient agreed with the plan and demonstrated an understanding of the instructions.   The patient was advised to call back or seek an in-person evaluation if the symptoms worsen or if the condition fails to improve as anticipated.   [facility] MD OP Progress Note  09/24/2019 3:17 PM Sergio Garcia  MRN:  [id number]  Chief Complaint:  Chief Complaint    Follow-up     HPI: Sergio Garcia is a 44 year old male, employed, lives in [place], has a history of PTSD, GAD, insomnia, GERD, PVC, Gilbert's syndrome, elevated LFT was evaluated by telemedicine today.  Patient today reports he is currently doing well on the current medication regimen.  He reports he has not noticed any side effects.  He reports mood symptoms are stable.  He denies any depression or anxiety attacks.  He was prescribed Klonopin recently however he may have used it 3-4 times since his last appointment a month ago.  Patient denies any suicidality, homicidality or perceptual disturbances.  He does report since his therapist is not available right now he has been working with a Transport planner at church.  He reports that does help.  Patient reports work is going well.  Patient denies any other concerns. Visit Diagnosis:    ICD-10-CM   1. PTSD (post-traumatic stress disorder)  F43.10   2. Generalized anxiety disorder  F41.1   3. Insomnia due to  mental condition  F51.05     Past Psychiatric History: I have reviewed past psychiatric history from my progress note on 09/20/2018.  Past trials of Wellbutrin, Zoloft, Effexor, Klonopin.  Past Medical History:  Past Medical History:  Diagnosis Date  . Anxiety   . Degenerative disc disease   . Depression   . Enteritis   . Gastritis   . GERD (gastroesophageal reflux disease)   . Gilbert's syndrome    hyperbilirubinemia  . IBS (irritable bowel syndrome)   . Inguinal hernia   . Insomnia   . Prostatitis 2008    Past Surgical History:  Procedure Laterality Date  . BACK SURGERY     2 ruptured discs  . CHOLECYSTECTOMY  01/31/2014   GB polyp, chronic cholecystitis   . ESOPHAGOGASTRODUODENOSCOPY  09/2004   gastritis acute and duodenitis  . ESOPHAGOGASTRODUODENOSCOPY  01-14-14   Dr Allen Norris  . EXPLORATORY LAPAROTOMY  Jan 2015   Dr Burt Knack  . LASIK      Family Psychiatric History: I have reviewed family psychiatric history from my progress note on 09/20/2018.  Family History:  Family History  Problem Relation Age of Onset  . Colon polyps Mother   . Cancer Mother        sinus cavity, on XRT  . Heart disease Father   . Heart attack Father        x 2  . Pancreatic cancer Maternal Grandfather   . Colon cancer Maternal Grandfather   . Diabetes Other  strong FH d both sides of family   . Heart Problems Paternal Grandfather   . Cancer Maternal Uncle   . Stroke Cousin        age 19/44  . Mental illness Neg Hx     Social History: I have reviewed social history from my progress note on 09/20/2018. Social History   Socioeconomic History  . Marital status: Legally Separated    Spouse name: Not on file  . Number of children: 2  . Years of education: Not on file  . Highest education level: High school graduate  Occupational History  . Occupation: Music therapist: [employer]  Tobacco Use  . Smoking status: Never Smoker  . Smokeless tobacco: Never Used   Substance and Sexual Activity  . Alcohol use: Yes    Comment: occassional social  . Drug use: No  . Sexual activity: Yes  Other Topics Concern  . Not on file  Social History Narrative   Married.   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   Enjoys watching racing, Dealer, deer hunting.             Social Determinants of Health   Financial Resource Strain:   . Difficulty of Paying Living Expenses:   Food Insecurity:   . Worried About Charity fundraiser in the Last Year:   . Arboriculturist in the Last Year:   Transportation Needs:   . Film/video editor (Medical):   Marland Kitchen Lack of Transportation (Non-Medical):   Physical Activity:   . Days of Exercise per Week:   . Minutes of Exercise per Session:   Stress:   . Feeling of Stress :   Social Connections:   . Frequency of Communication with Friends and Family:   . Frequency of Social Gatherings with Friends and Family:   . Attends Religious Services:   . Active Member of Clubs or Organizations:   . Attends Archivist Meetings:   Marland Kitchen Marital Status:     Allergies:  Allergies  Allergen Reactions  . Morphine And Related Anaphylaxis  . Morphine Nausea And Vomiting  . Tdap [Tetanus-Diphth-Acell Pertussis] Hives and Rash    Metabolic Disorder Labs: Lab Results  Component Value Date   HGBA1C 6.0 (H) 01/24/2019   No results found for: PROLACTIN Lab Results  Component Value Date   CHOL 173 01/24/2019   TRIG 125 01/24/2019   HDL 43 01/24/2019   CHOLHDL 4.0 01/24/2019   VLDL 16.2 03/02/2018   LDLCALC 108 (H) 01/24/2019   LDLCALC 120 (H) 03/02/2018   Lab Results  Component Value Date   TSH 1.400 01/24/2019   TSH 1.88 03/02/2018    Therapeutic Level Labs: No results found for: LITHIUM No results found for: VALPROATE No components found for:  CBMZ  Current Medications: Current Outpatient Medications  Medication Sig Dispense Refill  . azithromycin (ZITHROMAX) 250 MG tablet  2 pills day 1 and 1 pill day 2-5 with food (Patient not taking: Reported on 08/26/2019) 6 tablet 0  . cetirizine-pseudoephedrine (ZYRTEC-D) 5-120 MG tablet Take 1 tablet by mouth 2 (two) times daily. (Patient not taking: Reported on 08/26/2019) 30 tablet 0  . clonazePAM (KLONOPIN) 0.5 MG tablet Take 1 tablet (0.5 mg total) by mouth as directed. Take 1 tablet once a day up to 1-2 times a week if needed only for severe anxiety attacks 10 tablet 0  . fexofenadine (ALLEGRA  ALLERGY) 180 MG tablet Take 1 tablet (180 mg total) by mouth at bedtime as needed for allergies or rhinitis. (Patient not taking: Reported on 08/26/2019) 90 tablet 3  . FLUoxetine (PROZAC) 40 MG capsule Take 1 capsule (40 mg total) by mouth daily. 90 capsule 0  . fluticasone (FLONASE) 50 MCG/ACT nasal spray Place 2 sprays into both nostrils daily as needed for allergies or rhinitis. 16 g 11  . ID NOW COVID-19 KIT See admin instructions. for testing    . pantoprazole (PROTONIX) 40 MG tablet Take 40 mg by mouth daily.    . QUEtiapine (SEROQUEL) 25 MG tablet Take 1.5 tablets (37.5 mg total) by mouth at bedtime. 135 tablet 0  . sodium chloride (OCEAN) 0.65 % SOLN nasal spray Place 2 sprays into both nostrils as needed for congestion. 30 mL 11  . SUMAtriptan (IMITREX) 50 MG tablet Take 1 tablet (50 mg total) by mouth as needed for migraine. May repeat in 2 hours if headache persists or recurs. No more than 2x per week. No more than 200 mg total/24 hours 9 tablet 2   No current facility-administered medications for this visit.     Musculoskeletal: Strength & Muscle Tone: UTA Gait & Station: normal Patient leans: N/A  Psychiatric Specialty Exam: Review of Systems  Psychiatric/Behavioral: Negative for agitation, behavioral problems, confusion, decreased concentration, dysphoric mood, hallucinations, self-injury, sleep disturbance and suicidal ideas. The patient is not nervous/anxious and is not hyperactive.   All other systems reviewed and  are negative.   There were no vitals taken for this visit.There is no height or weight on file to calculate BMI.  General Appearance: Casual  Eye Contact:  Fair  Speech:  Clear and Coherent  Volume:  Normal  Mood:  Euthymic  Affect:  Congruent  Thought Process:  Goal Directed and Descriptions of Associations: Intact  Orientation:  Full (Time, Place, and Person)  Thought Content: Logical   Suicidal Thoughts:  No  Homicidal Thoughts:  No  Memory:  Immediate;   Fair Recent;   Fair Remote;   Fair  Judgement:  Fair  Insight:  Fair  Psychomotor Activity:  Normal  Concentration:  Concentration: Fair and Attention Span: Fair  Recall:  [employer] of Knowledge: Fair  Language: Fair  Akathisia:  No  Handed:  Right  AIMS (if indicated): UTA  Assets:  Communication Skills Desire for Improvement Housing Social Support  ADL's:  Intact  Cognition: WNL  Sleep:  Fair   Screenings: GAD-7     Office Visit from 01/24/2018 in [facility] from 10/24/2016 in [place] [facility] at [facility]  Total GAD-7 Score  14  14    PHQ2-9     Office Visit from 02/21/2019 in [facility] Visit from 01/24/2018 in [facility] from 05/10/2016 in [facility] at [place] from 11/25/2015 in [facility] Visit from 10/30/2015 in [employer]  PHQ-2 Total Score  0  6  0  0  0  PHQ-9 Total Score  --  19  --  --  --       Assessment and Plan: Sergio Garcia is a 44 year old male, lives in [place], separated, employed, has a history of PTSD, IBS, PVC, Gilbert's syndrome was evaluated by telemedicine today.  Patient is biologically predisposed given his history of trauma.  Patient with relationship struggles, legal problems, recent death in his family.  Patient however is currently  stable on current medication regimen.  He will continue to benefit from psychotherapy sessions.  Plan as  noted below.  Plan PTSD-stable Prozac 40 mg p.o. daily. Continue CBT.  Patient is motivated to establish care with new therapist. For the time being he will follow-up with his counselor at church.  GAD-stable Prozac as prescribed Seroquel 25 to 37.5 mg p.o. nightly Klonopin 0.5 mg as needed for severe anxiety attacks-he has been limiting use. Continue CBT  Insomnia-stable Seroquel as prescribed.  Follow-up in clinic in 2 months or sooner if needed.  I have spent atleast 20 minutes non face to face with patient today. More than 50 % of the time was spent for preparing to see the patient ( e.g., review of test, records ), ordering medications and test ,psychoeducation and supportive psychotherapy and care coordination,as well as documenting clinical information in electronic health record. This note was generated in part or whole with voice recognition software. Voice recognition is usually quite accurate but there are transcription errors that can and very often do occur. I apologize for any typographical errors that were not detected and corrected.        Ursula Alert, MD 09/24/2019, 3:17 PM

## 2019-09-26 ENCOUNTER — Encounter: Payer: Self-pay | Admitting: Physician Assistant

## 2019-09-26 ENCOUNTER — Ambulatory Visit (INDEPENDENT_AMBULATORY_CARE_PROVIDER_SITE_OTHER): Payer: BC Managed Care – PPO | Admitting: Physician Assistant

## 2019-09-26 ENCOUNTER — Other Ambulatory Visit: Payer: Self-pay

## 2019-09-26 VITALS — BP 128/84 | HR 58 | Ht 74.0 in | Wt 201.0 lb

## 2019-09-26 DIAGNOSIS — R0602 Shortness of breath: Secondary | ICD-10-CM | POA: Diagnosis not present

## 2019-09-26 DIAGNOSIS — R079 Chest pain, unspecified: Secondary | ICD-10-CM | POA: Diagnosis not present

## 2019-09-26 MED ORDER — ASPIRIN EC 81 MG PO TBEC: 81.0000 mg | DELAYED_RELEASE_TABLET | Freq: Every day | ORAL | 0 refills | Status: DC

## 2019-09-26 NOTE — Progress Notes (Signed)
Cardiology Office Note    Date:  09/26/2019   ID:  Sergio Garcia, DOB 12-02-1975, MRN [id number]  PCP:  [name], [name], MD  Cardiologist:  Kathlyn Sacramento, MD  Electrophysiologist:  None   Chief Complaint: Chest pain  History of Present Illness:   Sergio Garcia is a 44 y.o. male with history of Gilbert's syndrome, IBS, GERD, strong family history of CAD, PTSD, anxiety, and depression who presents for evaluation of chest pain.  Remote treadmill stress test in 2019 performed at [facility] for atypical chest pain showed excellent exercise capacity, with the patient exercising for 11 minutes, and was negative for ischemia.  He was evaluated by Dr. Fletcher Anon as a new patient in 05/2018 for palpitations and shortness of breath with associated extreme fatigue and dizziness.  At that time, he reported a syncopal episode, without injury, approximately 3 weeks prior while walking in his house.  Zio patch showed predominant rhythm of sinus with an average heart rate of 75 bpm with 1 run of SVT lasting 4 beats along with rare PACs and PVCs.  Echo showed an EF of 60 to 65%, normal LV cavity size, diastolic dysfunction, mildly dilated aortic root and ascending aorta, no significant valvular abnormalities.  Follow-up was recommended on an as-needed basis.  He comes in today noting a 2 to 3-week history of exertional chest pain that radiates to the left shoulder pain improves with rest.  Discomfort is described as pressure with associated shortness of breath and fatigue.  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  He has started taking aspirin at home with the onset of the above symptoms.  Pain feels more intense than what he was experiencing in 05/2018 with the above work-up.  No associated dizziness, presyncope, syncope, lower extremity swelling, abdominal tension, orthopnea, PND, or early satiety.  He reports his father and  paternal grandfather both were diagnosed with heart disease in their late 32s.   Labs independently reviewed: 01/2019 - TSH normal, Hgb 16.4, PLT 244, A1c 6.0, TC 173, TG 125, HDL 43, LDL 108, BUN 14, serum creatinine 1.23, potassium 4.3, albumin 4.7, AST/ALT normal  Past Medical History:  Diagnosis Date  . Anxiety   . Degenerative disc disease   . Depression   . Enteritis   . Gastritis   . GERD (gastroesophageal reflux disease)   . Gilbert's syndrome    hyperbilirubinemia  . IBS (irritable bowel syndrome)   . Inguinal hernia   . Insomnia   . Prostatitis 2008    Past Surgical History:  Procedure Laterality Date  . BACK SURGERY     2 ruptured discs  . CHOLECYSTECTOMY  01/31/2014   GB polyp, chronic cholecystitis   . ESOPHAGOGASTRODUODENOSCOPY  09/2004   gastritis acute and duodenitis  . ESOPHAGOGASTRODUODENOSCOPY  01-14-14   Dr Allen Norris  . EXPLORATORY LAPAROTOMY  Jan 2015   Dr Burt Knack  . LASIK      Current Medications: Current Meds  Medication Sig  . cetirizine-pseudoephedrine (ZYRTEC-D) 5-120 MG tablet Take 1 tablet by mouth 2 (two) times daily.  . clonazePAM (KLONOPIN) 0.5 MG tablet Take 1 tablet (0.5 mg total) by mouth as directed. Take 1 tablet once a day up to 1-2 times a week if needed only for severe anxiety attacks  . FLUoxetine (PROZAC) 40 MG capsule Take 1 capsule (40 mg total) by mouth daily.  . ID NOW COVID-19 KIT See  admin instructions. for testing  . QUEtiapine (SEROQUEL) 25 MG tablet Take 1.5 tablets (37.5 mg total) by mouth at bedtime.  . SUMAtriptan (IMITREX) 50 MG tablet Take 1 tablet (50 mg total) by mouth as needed for migraine. May repeat in 2 hours if headache persists or recurs. No more than 2x per week. No more than 200 mg total/24 hours    Allergies:   Morphine and related, Morphine, and Tdap [tetanus-diphth-acell pertussis]   Social History   Socioeconomic History  . Marital status: Legally Separated    Spouse name: Not on file  . Number of  children: 2  . Years of education: Not on file  . Highest education level: High school graduate  Occupational History  . Occupation: Music therapist: [employer]  Tobacco Use  . Smoking status: Never Smoker  . Smokeless tobacco: Never Used  Substance and Sexual Activity  . Alcohol use: Yes    Comment: occassional social  . Drug use: No  . Sexual activity: Yes  Other Topics Concern  . Not on file  Social History Narrative   Married.   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   Enjoys watching racing, Dealer, deer hunting.             Social Determinants of Health   Financial Resource Strain:   . Difficulty of Paying Living Expenses:   Food Insecurity:   . Worried About Charity fundraiser in the Last Year:   . Arboriculturist in the Last Year:   Transportation Needs:   . Film/video editor (Medical):   Marland Kitchen Lack of Transportation (Non-Medical):   Physical Activity:   . Days of Exercise per Week:   . Minutes of Exercise per Session:   Stress:   . Feeling of Stress :   Social Connections:   . Frequency of Communication with Friends and Family:   . Frequency of Social Gatherings with Friends and Family:   . Attends Religious Services:   . Active Member of Clubs or Organizations:   . Attends Archivist Meetings:   Marland Kitchen Marital Status:      Family History:  The patient's family history includes Cancer in his maternal uncle and mother; Colon cancer in his maternal grandfather; Colon polyps in his mother; Diabetes in an other family member; Heart Problems in his paternal grandfather; Heart attack in his father; Heart disease in his father; Pancreatic cancer in his maternal grandfather; Stroke in his cousin. There is no history of Mental illness.  ROS:   Review of Systems  Constitutional: Positive for malaise/fatigue. Negative for chills, diaphoresis, fever and weight loss.  HENT: Negative for congestion.   Eyes:  Negative for discharge and redness.  Respiratory: Positive for shortness of breath. Negative for cough, sputum production and wheezing.   Cardiovascular: Positive for chest pain. Negative for palpitations, orthopnea, claudication, leg swelling and PND.  Gastrointestinal: Negative for abdominal pain, blood in stool, heartburn, melena, nausea and vomiting.  Musculoskeletal: Negative for falls and myalgias.  Skin: Negative for rash.  Neurological: Positive for weakness. Negative for dizziness, tingling, tremors, sensory change, speech change, focal weakness and loss of consciousness.  Endo/Heme/Allergies: Does not bruise/bleed easily.  Psychiatric/Behavioral: Negative for substance abuse. The patient is not nervous/anxious.   All other systems reviewed and are negative.    EKGs/Labs/Other Studies Reviewed:    Studies reviewed were summarized above. The additional studies  were reviewed today:  Treadmill stress test 12/2017:  No significant ST segment changes or arrhythmias were noted during  stress  Overall, the patient's exercise capacity was excellent.  Negative ETT for ischemia at workload achieved  Stress symptoms reproduced the patients reason for test  No significant arrhythmia noted  Normal stress test, negative for ischemia __________  Elwyn Reach patch 05/2018: Normal sinus rhythm with an average heart rate of 75 bpm. One run of supraventricular tachycardia lasting 4 beats with a maximum heart rate 115 bpm. Rare PACs and PVCs. __________  2D echo 05/2018: 1. The left ventricle has normal systolic function of 16-10%. The cavity  size is normal. There is no left ventricular wall thickness. Echo evidence  of impaired relaxation diastolic filling patterns.  2. Normal left atrial size.  3. Normal right atrial size.  4. Normal tricuspid valve.  5. Mild dilatation of the aortic root and ascending aorta.  6. The aortic valve normal in structure and function.Trileaflet.  7.  There is normal RV systolic function.   EKG:  EKG is ordered today.  The EKG ordered today demonstrates sinus bradycardia, 58 bpm, nonspecific inferior st/t changes unchanged from prior   Recent Labs: 01/24/2019: ALT 31; BUN 14; Creatinine, Ser 1.23; Hemoglobin 16.4; Platelets 244; Potassium 4.3; Sodium 142; TSH 1.400  Recent Lipid Panel    Component Value Date/Time   CHOL 173 01/24/2019 0817   TRIG 125 01/24/2019 0817   HDL 43 01/24/2019 0817   CHOLHDL 4.0 01/24/2019 0817   CHOLHDL 4 03/02/2018 0830   VLDL 16.2 03/02/2018 0830   LDLCALC 108 (H) 01/24/2019 0817    PHYSICAL EXAM:    VS:  BP 128/84 (BP Location: Right Arm, Patient Position: Sitting, Cuff Size: Normal)   Pulse (!) 58   Ht 6' 2"  (1.88 m)   Wt 201 lb (91.2 kg)   SpO2 97%   BMI 25.81 kg/m   BMI: Body mass index is 25.81 kg/m.  Physical Exam  Constitutional: He is oriented to person, place, and time. He appears well-developed and well-nourished.  HENT:  Head: Normocephalic and atraumatic.  Eyes: Right eye exhibits no discharge. Left eye exhibits no discharge.  Neck: No JVD present.  Cardiovascular: Regular rhythm, S1 normal, S2 normal and normal heart sounds. Bradycardia present. Exam reveals no distant heart sounds, no friction rub, no midsystolic click and no opening snap.  No murmur heard. Pulses:      Posterior tibial pulses are 2+ on the right side and 2+ on the left side.  Pulmonary/Chest: Effort normal and breath sounds normal. No respiratory distress. He has no decreased breath sounds. He has no wheezes. He has no rales. He exhibits no tenderness.  Abdominal: Soft. He exhibits no distension. There is no abdominal tenderness.  Musculoskeletal:        General: No edema.     Cervical back: Normal range of motion.  Neurological: He is alert and oriented to person, place, and time.  Skin: Skin is warm and dry. No cyanosis. Nails show no clubbing.  Psychiatric: He has a normal mood and affect. His speech is  normal and behavior is normal. Judgment and thought content normal.    Wt Readings from Last 3 Encounters:  09/26/19 201 lb (91.2 kg)  04/09/19 191 lb (86.6 kg)  02/21/19 190 lb (86.2 kg)     ASSESSMENT & PLAN:   1. Chest pain with moderate risk for cardiac etiology/dyspnea: He last had chest pain earlier this week while changing prodromes  at work.  Discussed invasive and noninvasive testing modalities.  We have agreed to proceed with Lexiscan MPI to evaluate for high risk ischemia.  He should continue aspirin 81 mg daily.  If his Carlton Adam is found to be abnormal or if he is found to have coronary artery calcification on attenuation corrected CT images we will escalate medical therapy as indicated.  Recent echo with preserved LV systolic function and no significant structural abnormalities.  Disposition: F/u with Dr. Fletcher Anon or an APP in 2 weeks.   Medication Adjustments/Labs and Tests Ordered: Current medicines are reviewed at length with the patient today.  Concerns regarding medicines are outlined above. Medication changes, Labs and Tests ordered today are summarized above and listed in the Patient Instructions accessible in Encounters.   Signed, [name], PA-C 09/26/2019 2:44 PM     [address] [place], [place] [address] [phone]

## 2019-09-26 NOTE — Patient Instructions (Signed)
Medication Instructions:   1. Your physician has recommended you make the following change in your medication:  - START Aspirin 81 - Take one tablet by mouth daily.   *If you need a refill on your cardiac medications before your next appointment, please call your pharmacy*   Lab Work:  1. None Ordered  If you have labs (blood work) drawn today and your tests are completely normal, you will receive your results only by: Marland Kitchen MyChart Message (if you have MyChart) OR . A paper copy in the mail If you have any lab test that is abnormal or we need to change your treatment, we will call you to review the results.   Testing/Procedures:  .Williams  Your caregiver has ordered a Stress Test with nuclear imaging. The purpose of this test is to evaluate the blood supply to your heart muscle. This procedure is referred to as a "Non-Invasive Stress Test." This is because other than having an IV started in your vein, nothing is inserted or "invades" your body. Cardiac stress tests are done to find areas of poor blood flow to the heart by determining the extent of coronary artery disease (CAD). Some patients exercise on a treadmill, which naturally increases the blood flow to your heart, while others who are  unable to walk on a treadmill due to physical limitations have a pharmacologic/chemical stress agent called Lexiscan . This medicine will mimic walking on a treadmill by temporarily increasing your coronary blood flow.   Please note: these test may take anywhere between 2-4 hours to complete  PLEASE REPORT TO [employer] AT THE FIRST DESK WILL DIRECT YOU WHERE TO GO  Date of Procedure:_____________________________________  Arrival Time for Procedure:______________________________  Instructions regarding medication:  - take all morning medications with a safe sip of water.    PLEASE NOTIFY THE OFFICE AT LEAST 46 HOURS IN ADVANCE IF YOU ARE UNABLE TO KEEP YOUR  APPOINTMENT.  [phone] AND  PLEASE NOTIFY NUCLEAR MEDICINE AT [facility] AT LEAST 24 HOURS IN ADVANCE IF YOU ARE UNABLE TO KEEP YOUR APPOINTMENT. [phone]  How to prepare for your Myoview test:  1. Do not eat or drink after midnight 2. No caffeine for 24 hours prior to test 3. No smoking 24 hours prior to test. 4. Your medication may be taken with water.  If your doctor stopped a medication because of this test, do not take that medication. 5. Ladies, please do not wear dresses.  Skirts or pants are appropriate. Please wear a short sleeve shirt. 6. No perfume, cologne or lotion. 7. Wear comfortable walking shoes. No heels!   Follow-Up: At [facility], you and your health needs are our priority.  As part of our continuing mission to provide you with exceptional heart care, we have created designated Provider Care Teams.  These Care Teams include your primary Cardiologist (physician) and Advanced Practice Providers (APPs -  Physician Assistants and Nurse Practitioners) who all work together to provide you with the care you need, when you need it.  We recommend signing up for the patient portal called "MyChart".  Sign up information is provided on this After Visit Summary.  MyChart is used to connect with patients for Virtual Visits (Telemedicine).  Patients are able to view lab/test results, encounter notes, upcoming appointments, etc.  Non-urgent messages can be sent to your provider as well.   To learn more about what you can do with MyChart, go to [email or web address].  Your next appointment:   2 week(s)  The format for your next appointment:   In Person  Provider:    You may see Kathlyn Sacramento, MD or  [name], PA-C

## 2019-10-07 ENCOUNTER — Encounter
Admission: RE | Admit: 2019-10-07 | Discharge: 2019-10-07 | Disposition: A | Discharge status: Home / Self Care | Payer: BC Managed Care – PPO | Source: Ambulatory Visit | Attending: Physician Assistant | Admitting: Physician Assistant

## 2019-10-07 ENCOUNTER — Other Ambulatory Visit: Payer: Self-pay

## 2019-10-07 DIAGNOSIS — R0602 Shortness of breath: Secondary | ICD-10-CM | POA: Diagnosis present

## 2019-10-07 LAB — NM MYOCAR MULTI W/SPECT W/WALL MOTION / EF
Estimated workload: 1 METS
Exercise duration (min): 0 min
Exercise duration (sec): 0 s
LV dias vol: 92 mL (ref 62–150)
LV sys vol: 36 mL
MPHR: 179 {beats}/min
Peak HR: 112 {beats}/min
Percent HR: 62 %
Rest HR: 65 {beats}/min
SDS: 5
SRS: 0
SSS: 9
TID: 0.82

## 2019-10-07 MED ORDER — TECHNETIUM TC 99M TETROFOSMIN IV KIT
32.1700 | PACK | Freq: Once | INTRAVENOUS | Status: AC | PRN
  Administered 2019-10-07: 32.17 via INTRAVENOUS

## 2019-10-07 MED ORDER — REGADENOSON 0.4 MG/5ML IV SOLN
0.4000 mg | Freq: Once | INTRAVENOUS | Status: AC
  Administered 2019-10-07: 0.4 mg via INTRAVENOUS

## 2019-10-07 MED ORDER — TECHNETIUM TC 99M TETROFOSMIN IV KIT
10.0000 | PACK | Freq: Once | INTRAVENOUS | Status: AC | PRN
  Administered 2019-10-07: 10.65 via INTRAVENOUS

## 2019-10-10 ENCOUNTER — Other Ambulatory Visit: Payer: BC Managed Care – PPO

## 2019-10-13 NOTE — H&P (View-Only) (Signed)
 Cardiology Office Note    Date:  10/16/2019   ID:  Sergio Garcia, DOB 03/20/1976, MRN [id number]  PCP:  [name], Tracy N, MD  Cardiologist:  Muhammad Arida, MD  Electrophysiologist:  None   Chief Complaint: Follow up  History of Present Illness:   Sergio Garcia is a 43 y.o. male with history of Gilbert's syndrome, IBS, GERD, strong family history of CAD, PTSD, anxiety, and depression who presents for follow up of recent [place]iscan MPI.  Remote treadmill stress test in 2019 performed at [facility] for atypical chest pain showed excellent exercise capacity, with the patient exercising for 11 minutes, and was negative for ischemia.  He was evaluated by Dr. Arida as a new patient in 05/2018 for palpitations and shortness of breath with associated extreme fatigue and dizziness.  At that time, he reported a syncopal episode, without injury, approximately 3 weeks prior while walking in his house.  Zio patch showed predominant rhythm of sinus with an average heart rate of 75 bpm with 1 run of SVT lasting 4 beats along with rare PACs and PVCs.  Echo showed an EF of 60 to 65%, normal LV cavity size, diastolic dysfunction, mildly dilated aortic root and ascending aorta, no significant valvular abnormalities.    He was seen on 09/26/2019, noting a 2 to 3-week history of exertional chest pain that radiated to the left shoulder, improved with rest, and was described as pressure with associated shortness of breath and fatigue.  Lexiscan MPI on 10/07/2019, showed no significant ischemia with TWI noted in leads II, III, aVF, V5, and V6.  EF 55-65%.  No significant coronary artery calcium noted on CT images.  Overall, this was a low risk study.   He comes in today accompanied by his girlfriend.  He feels about the same today as he did at his last visit, possibly a little worse.  He continues to note exertional chest pain, dyspnea, fatigue, and dizziness.  No presyncope or syncope.  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  He ultimately ended up leaving work early and slept the rest of the day, all through the night, and into the late morning hours today.   Labs independently reviewed: 01/2019 - TSH normal, Hgb 16.4, PLT 244, A1c 6.0, TC 173, TG 125, HDL 43, LDL 108, BUN 14, serum creatinine 1.23, potassium 4.3, albumin 4.7, AST/ALT normal   Past Medical History:  Diagnosis Date  . Anxiety   . Degenerative disc disease   . Depression   . Enteritis   . Gastritis   . GERD (gastroesophageal reflux disease)   . Gilbert's syndrome    hyperbilirubinemia  . IBS (irritable bowel syndrome)   . Inguinal hernia   . Insomnia   . Prostatitis 2008    Past Surgical History:  Procedure Laterality Date  . BACK SURGERY     2 ruptured discs  . CHOLECYSTECTOMY  01/31/2014   GB polyp, chronic cholecystitis   . ESOPHAGOGASTRODUODENOSCOPY  09/2004   gastritis acute and duodenitis  . ESOPHAGOGASTRODUODENOSCOPY  01-14-14   Dr Wohl  . EXPLORATORY LAPAROTOMY  Jan 2015   Dr Cooper  . LASIK      Current Medications: Current Meds  Medication Sig  . aspirin EC 81 MG tablet Take 1 tablet (81 mg total) by mouth daily.  . cetirizine-pseudoephedrine (ZYRTEC-D) 5-120 MG tablet Take 1 tablet by mouth 2 (two) times daily.  . clonazePAM (KLONOPIN) 0.5   MG tablet Take 1 tablet (0.5 mg total) by mouth as directed. Take 1 tablet once a day up to 1-2 times a week if needed only for severe anxiety attacks  . FLUoxetine (PROZAC) 40 MG capsule Take 1 capsule (40 mg total) by mouth daily.  . ID NOW COVID-19 KIT See admin instructions. for testing  . QUEtiapine (SEROQUEL) 25 MG tablet Take 1.5 tablets (37.5 mg total) by mouth at bedtime.  . SUMAtriptan (IMITREX) 50 MG tablet Take 1 tablet (50 mg total) by mouth as needed for migraine. May repeat in 2 hours if headache persists or recurs. No more than 2x per week. No more than 200 mg total/24 hours    Allergies:   Morphine and  related, Morphine, and Tdap [tetanus-diphth-acell pertussis]   Social History   Socioeconomic History  . Marital status: Legally Separated    Spouse name: Not on file  . Number of children: 2  . Years of education: Not on file  . Highest education level: High school graduate  Occupational History  . Occupation: Music therapist: [employer]  Tobacco Use  . Smoking status: Never Smoker  . Smokeless tobacco: Never Used  Substance and Sexual Activity  . Alcohol use: Yes    Comment: occassional social  . Drug use: No  . Sexual activity: Yes  Other Topics Concern  . Not on file  Social History Narrative   Married.   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   Enjoys watching racing, Dealer, deer hunting.             Social Determinants of Health   Financial Resource Strain:   . Difficulty of Paying Living Expenses:   Food Insecurity:   . Worried About Charity fundraiser in the Last Year:   . Arboriculturist in the Last Year:   Transportation Needs:   . Film/video editor (Medical):   Marland Kitchen Lack of Transportation (Non-Medical):   Physical Activity:   . Days of Exercise per Week:   . Minutes of Exercise per Session:   Stress:   . Feeling of Stress :   Social Connections:   . Frequency of Communication with Friends and Family:   . Frequency of Social Gatherings with Friends and Family:   . Attends Religious Services:   . Active Member of Clubs or Organizations:   . Attends Archivist Meetings:   Marland Kitchen Marital Status:      Family History:  The patient's family history includes Cancer in his maternal uncle and mother; Colon cancer in his maternal grandfather; Colon polyps in his mother; Diabetes in an other family member; Heart Problems in his paternal grandfather; Heart attack in his father; Heart disease in his father; Pancreatic cancer in his maternal grandfather; Stroke in his cousin. There is no history of Mental  illness.  ROS:   Review of Systems  Constitutional: Positive for malaise/fatigue. Negative for chills, diaphoresis, fever and weight loss.  HENT: Negative for congestion.   Eyes: Negative for discharge and redness.  Respiratory: Positive for shortness of breath. Negative for cough, hemoptysis, sputum production and wheezing.   Cardiovascular: Positive for chest pain. Negative for palpitations, orthopnea, claudication, leg swelling and PND.  Gastrointestinal: Positive for melena. Negative for abdominal pain, blood in stool, heartburn, nausea and vomiting.  Genitourinary: Negative for hematuria.  Musculoskeletal: Negative for falls and myalgias.  Skin: Negative for rash.  Neurological: Positive for dizziness and weakness. Negative for tingling, tremors, sensory change, speech change, focal weakness and loss of consciousness.  Endo/Heme/Allergies: Does not bruise/bleed easily.  Psychiatric/Behavioral: Negative for substance abuse. The patient is nervous/anxious.   All other systems reviewed and are negative.    EKGs/Labs/Other Studies Reviewed:    Studies reviewed were summarized above. The additional studies were reviewed today:  Lexiscan MPI 10/07/2019:  T wave inversion was noted during stress in the II, III, aVF, V5 and V6 leads.  The study is normal.  This is a low risk study.  The left ventricular ejection fraction is normal (55-65%).  Suboptimal study due to GI uptake.   EKG:  EKG is ordered today.  The EKG ordered today demonstrates NSR, 62 bpm, inferior T wave inversion, largely unchanged when compared to prior  Recent Labs: 01/24/2019: ALT 31; BUN 14; Creatinine, Ser 1.23; Hemoglobin 16.4; Platelets 244; Potassium 4.3; Sodium 142; TSH 1.400  Recent Lipid Panel    Component Value Date/Time   CHOL 173 01/24/2019 0817   TRIG 125 01/24/2019 0817   HDL 43 01/24/2019 0817   CHOLHDL 4.0 01/24/2019 0817   CHOLHDL 4 03/02/2018 0830   VLDL 16.2 03/02/2018 0830   LDLCALC  108 (H) 01/24/2019 0817    PHYSICAL EXAM:    VS:  BP 122/86 (BP Location: Left Arm, Patient Position: Sitting, Cuff Size: Normal)   Pulse 62   Ht 6' 2" (1.88 m)   Wt 200 lb 4 oz (90.8 kg)   SpO2 97%   BMI 25.71 kg/m   BMI: Body mass index is 25.71 kg/m.  Physical Exam  Constitutional: He is oriented to person, place, and time. He appears well-developed and well-nourished.  HENT:  Head: Normocephalic and atraumatic.  Eyes: Right eye exhibits no discharge. Left eye exhibits no discharge.  Neck: No JVD present.  Cardiovascular: Normal rate, regular rhythm, S1 normal, S2 normal and normal heart sounds. Exam reveals no distant heart sounds, no friction rub, no midsystolic click and no opening snap.  No murmur heard. Pulses:      Posterior tibial pulses are 2+ on the right side and 2+ on the left side.  Pulmonary/Chest: Effort normal and breath sounds normal. No respiratory distress. He has no decreased breath sounds. He has no wheezes. He has no rales. He exhibits no tenderness.  Abdominal: Soft. He exhibits no distension. There is no abdominal tenderness.  Musculoskeletal:        General: No edema.     Cervical back: Normal range of motion.  Neurological: He is alert and oriented to person, place, and time.  Skin: Skin is warm and dry. No cyanosis. Nails show no clubbing.  Psychiatric: He has a normal mood and affect. His speech is normal and behavior is normal. Judgment and thought content normal.    Wt Readings from Last 3 Encounters:  10/16/19 200 lb 4 oz (90.8 kg)  09/26/19 201 lb (91.2 kg)  04/09/19 191 lb (86.6 kg)     Orthostatic vital signs: Lying: 122/79, 60 bpm Sitting: 114/81, 64 bpm, dizzy Standing: 138/95, 67 bpm, dizzy Standing x3 minutes: 130/82, 85 bpm  ASSESSMENT & PLAN:   1. Accelerating angina with exertional dyspnea, fatigue, and dizziness: Currently asymptomatic.  Given continued/progressive times we will plan to proceed with diagnostic R/LHC for  further evaluation.  Continue ASA 81 mg daily.  Start isosorbide mononitrate 30 mg daily.  Recommend escalation of lipid therapy based on findings.  Risks and benefits of cardiac   catheterization have been discussed with the patient including risks of bleeding, bruising, infection, kidney damage, stroke, heart attack, urgent need for bypass surgery, injury to a limb, and death. The patient understands these risks and is willing to proceed with the procedure. All questions have been answered and concerns listened to.   2. Possible melena: He reports a several day history last week of dark stools.  He denies taking any aspirin or Pepto-Bismol.  Await CBC.  Recommend he follow-up with PCP.  Disposition: F/u with Dr. Fletcher Anon or an APP in 1 week.   Medication Adjustments/Labs and Tests Ordered: Current medicines are reviewed at length with the patient today.  Concerns regarding medicines are outlined above. Medication changes, Labs and Tests ordered today are summarized above and listed in the Patient Instructions accessible in Encounters.   Signed, [name], PA-C 10/16/2019 1:08 PM     [address] [place], [place] [address] [phone]

## 2019-10-13 NOTE — Progress Notes (Signed)
Cardiology Office Note    Date:  10/16/2019   ID:  REGGIE BISE, DOB 05-27-1975, MRN [id number]  PCP:  [name], [name], MD  Cardiologist:  Kathlyn Sacramento, MD  Electrophysiologist:  None   Chief Complaint: Follow up  History of Present Illness:   Sergio Garcia is a 44 y.o. male with history of Gilbert's syndrome, IBS, GERD, strong family history of CAD, PTSD, anxiety, and depression who presents for follow up of recent [place] MPI.  Remote treadmill stress test in 2019 performed at [facility] for atypical chest pain showed excellent exercise capacity, with the patient exercising for 11 minutes, and was negative for ischemia.  He was evaluated by Dr. Fletcher Anon as a new patient in 05/2018 for palpitations and shortness of breath with associated extreme fatigue and dizziness.  At that time, he reported a syncopal episode, without injury, approximately 3 weeks prior while walking in his house.  Zio patch showed predominant rhythm of sinus with an average heart rate of 75 bpm with 1 run of SVT lasting 4 beats along with rare PACs and PVCs.  Echo showed an EF of 60 to 65%, normal LV cavity size, diastolic dysfunction, mildly dilated aortic root and ascending aorta, no significant valvular abnormalities.    He was seen on 09/26/2019, noting a 2 to 3-week history of exertional chest pain that radiated to the left shoulder, improved with rest, and was described as pressure with associated shortness of breath and fatigue.  Lexiscan MPI on 10/07/2019, showed no significant ischemia with TWI noted in leads II, III, aVF, V5, and V6.  EF 55-65%.  No significant coronary artery calcium noted on CT images.  Overall, this was a low risk study.   He comes in today accompanied by his girlfriend.  He feels about the same today as he did at his last visit, possibly a little worse.  He continues to note exertional chest pain, dyspnea, fatigue, and dizziness.  No presyncope or syncope.  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  He ultimately ended up leaving work early and slept the rest of the day, all through the night, and into the late morning hours today.   Labs independently reviewed: 01/2019 - TSH normal, Hgb 16.4, PLT 244, A1c 6.0, TC 173, TG 125, HDL 43, LDL 108, BUN 14, serum creatinine 1.23, potassium 4.3, albumin 4.7, AST/ALT normal   Past Medical History:  Diagnosis Date  . Anxiety   . Degenerative disc disease   . Depression   . Enteritis   . Gastritis   . GERD (gastroesophageal reflux disease)   . Gilbert's syndrome    hyperbilirubinemia  . IBS (irritable bowel syndrome)   . Inguinal hernia   . Insomnia   . Prostatitis 2008    Past Surgical History:  Procedure Laterality Date  . BACK SURGERY     2 ruptured discs  . CHOLECYSTECTOMY  01/31/2014   GB polyp, chronic cholecystitis   . ESOPHAGOGASTRODUODENOSCOPY  09/2004   gastritis acute and duodenitis  . ESOPHAGOGASTRODUODENOSCOPY  01-14-14   Dr Allen Norris  . EXPLORATORY LAPAROTOMY  Jan 2015   Dr Burt Knack  . LASIK      Current Medications: Current Meds  Medication Sig  . aspirin EC 81 MG tablet Take 1 tablet (81 mg total) by mouth daily.  . cetirizine-pseudoephedrine (ZYRTEC-D) 5-120 MG tablet Take 1 tablet by mouth 2 (two) times daily.  . clonazePAM (KLONOPIN) 0.5  MG tablet Take 1 tablet (0.5 mg total) by mouth as directed. Take 1 tablet once a day up to 1-2 times a week if needed only for severe anxiety attacks  . FLUoxetine (PROZAC) 40 MG capsule Take 1 capsule (40 mg total) by mouth daily.  . ID NOW COVID-19 KIT See admin instructions. for testing  . QUEtiapine (SEROQUEL) 25 MG tablet Take 1.5 tablets (37.5 mg total) by mouth at bedtime.  . SUMAtriptan (IMITREX) 50 MG tablet Take 1 tablet (50 mg total) by mouth as needed for migraine. May repeat in 2 hours if headache persists or recurs. No more than 2x per week. No more than 200 mg total/24 hours    Allergies:   Morphine and  related, Morphine, and Tdap [tetanus-diphth-acell pertussis]   Social History   Socioeconomic History  . Marital status: Legally Separated    Spouse name: Not on file  . Number of children: 2  . Years of education: Not on file  . Highest education level: High school graduate  Occupational History  . Occupation: Music therapist: [employer]  Tobacco Use  . Smoking status: Never Smoker  . Smokeless tobacco: Never Used  Substance and Sexual Activity  . Alcohol use: Yes    Comment: occassional social  . Drug use: No  . Sexual activity: Yes  Other Topics Concern  . Not on file  Social History Narrative   Married.   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   Enjoys watching racing, Dealer, deer hunting.             Social Determinants of Health   Financial Resource Strain:   . Difficulty of Paying Living Expenses:   Food Insecurity:   . Worried About Charity fundraiser in the Last Year:   . Arboriculturist in the Last Year:   Transportation Needs:   . Film/video editor (Medical):   Marland Kitchen Lack of Transportation (Non-Medical):   Physical Activity:   . Days of Exercise per Week:   . Minutes of Exercise per Session:   Stress:   . Feeling of Stress :   Social Connections:   . Frequency of Communication with Friends and Family:   . Frequency of Social Gatherings with Friends and Family:   . Attends Religious Services:   . Active Member of Clubs or Organizations:   . Attends Archivist Meetings:   Marland Kitchen Marital Status:      Family History:  The patient's family history includes Cancer in his maternal uncle and mother; Colon cancer in his maternal grandfather; Colon polyps in his mother; Diabetes in an other family member; Heart Problems in his paternal grandfather; Heart attack in his father; Heart disease in his father; Pancreatic cancer in his maternal grandfather; Stroke in his cousin. There is no history of Mental  illness.  ROS:   Review of Systems  Constitutional: Positive for malaise/fatigue. Negative for chills, diaphoresis, fever and weight loss.  HENT: Negative for congestion.   Eyes: Negative for discharge and redness.  Respiratory: Positive for shortness of breath. Negative for cough, hemoptysis, sputum production and wheezing.   Cardiovascular: Positive for chest pain. Negative for palpitations, orthopnea, claudication, leg swelling and PND.  Gastrointestinal: Positive for melena. Negative for abdominal pain, blood in stool, heartburn, nausea and vomiting.  Genitourinary: Negative for hematuria.  Musculoskeletal: Negative for falls and myalgias.  Skin: Negative for rash.  Neurological: Positive for dizziness and weakness. Negative for tingling, tremors, sensory change, speech change, focal weakness and loss of consciousness.  Endo/Heme/Allergies: Does not bruise/bleed easily.  Psychiatric/Behavioral: Negative for substance abuse. The patient is nervous/anxious.   All other systems reviewed and are negative.    EKGs/Labs/Other Studies Reviewed:    Studies reviewed were summarized above. The additional studies were reviewed today:  Lexiscan MPI 10/07/2019:  T wave inversion was noted during stress in the II, III, aVF, V5 and V6 leads.  The study is normal.  This is a low risk study.  The left ventricular ejection fraction is normal (55-65%).  Suboptimal study due to GI uptake.   EKG:  EKG is ordered today.  The EKG ordered today demonstrates NSR, 62 bpm, inferior T wave inversion, largely unchanged when compared to prior  Recent Labs: 01/24/2019: ALT 31; BUN 14; Creatinine, Ser 1.23; Hemoglobin 16.4; Platelets 244; Potassium 4.3; Sodium 142; TSH 1.400  Recent Lipid Panel    Component Value Date/Time   CHOL 173 01/24/2019 0817   TRIG 125 01/24/2019 0817   HDL 43 01/24/2019 0817   CHOLHDL 4.0 01/24/2019 0817   CHOLHDL 4 03/02/2018 0830   VLDL 16.2 03/02/2018 0830   LDLCALC  108 (H) 01/24/2019 0817    PHYSICAL EXAM:    VS:  BP 122/86 (BP Location: Left Arm, Patient Position: Sitting, Cuff Size: Normal)   Pulse 62   Ht 6' 2" (1.88 m)   Wt 200 lb 4 oz (90.8 kg)   SpO2 97%   BMI 25.71 kg/m   BMI: Body mass index is 25.71 kg/m.  Physical Exam  Constitutional: He is oriented to person, place, and time. He appears well-developed and well-nourished.  HENT:  Head: Normocephalic and atraumatic.  Eyes: Right eye exhibits no discharge. Left eye exhibits no discharge.  Neck: No JVD present.  Cardiovascular: Normal rate, regular rhythm, S1 normal, S2 normal and normal heart sounds. Exam reveals no distant heart sounds, no friction rub, no midsystolic click and no opening snap.  No murmur heard. Pulses:      Posterior tibial pulses are 2+ on the right side and 2+ on the left side.  Pulmonary/Chest: Effort normal and breath sounds normal. No respiratory distress. He has no decreased breath sounds. He has no wheezes. He has no rales. He exhibits no tenderness.  Abdominal: Soft. He exhibits no distension. There is no abdominal tenderness.  Musculoskeletal:        General: No edema.     Cervical back: Normal range of motion.  Neurological: He is alert and oriented to person, place, and time.  Skin: Skin is warm and dry. No cyanosis. Nails show no clubbing.  Psychiatric: He has a normal mood and affect. His speech is normal and behavior is normal. Judgment and thought content normal.    Wt Readings from Last 3 Encounters:  10/16/19 200 lb 4 oz (90.8 kg)  09/26/19 201 lb (91.2 kg)  04/09/19 191 lb (86.6 kg)     Orthostatic vital signs: Lying: 122/79, 60 bpm Sitting: 114/81, 64 bpm, dizzy Standing: 138/95, 67 bpm, dizzy Standing x3 minutes: 130/82, 85 bpm  ASSESSMENT & PLAN:   1. Accelerating angina with exertional dyspnea, fatigue, and dizziness: Currently asymptomatic.  Given continued/progressive times we will plan to proceed with diagnostic [facility] for  further evaluation.  Continue ASA 81 mg daily.  Start isosorbide mononitrate 30 mg daily.  Recommend escalation of lipid therapy based on findings.  Risks and benefits of cardiac  catheterization have been discussed with the patient including risks of bleeding, bruising, infection, kidney damage, stroke, heart attack, urgent need for bypass surgery, injury to a limb, and death. The patient understands these risks and is willing to proceed with the procedure. All questions have been answered and concerns listened to.   2. Possible melena: He reports a several day history last week of dark stools.  He denies taking any aspirin or Pepto-Bismol.  Await CBC.  Recommend he follow-up with PCP.  Disposition: F/u with Dr. Fletcher Anon or an APP in 1 week.   Medication Adjustments/Labs and Tests Ordered: Current medicines are reviewed at length with the patient today.  Concerns regarding medicines are outlined above. Medication changes, Labs and Tests ordered today are summarized above and listed in the Patient Instructions accessible in Encounters.   Signed, [name], PA-C 10/16/2019 1:08 PM     [address], [place] [address] [phone]

## 2019-10-16 ENCOUNTER — Ambulatory Visit (INDEPENDENT_AMBULATORY_CARE_PROVIDER_SITE_OTHER): Payer: BC Managed Care – PPO | Admitting: Physician Assistant

## 2019-10-16 ENCOUNTER — Encounter: Payer: Self-pay | Admitting: Physician Assistant

## 2019-10-16 ENCOUNTER — Other Ambulatory Visit
Admission: RE | Admit: 2019-10-16 | Discharge: 2019-10-16 | Disposition: A | Discharge status: Home / Self Care | Payer: BC Managed Care – PPO | Source: Ambulatory Visit | Attending: Cardiovascular Disease | Admitting: Cardiovascular Disease

## 2019-10-16 ENCOUNTER — Other Ambulatory Visit: Payer: Self-pay

## 2019-10-16 VITALS — BP 122/86 | HR 62 | Ht 74.0 in | Wt 200.2 lb

## 2019-10-16 DIAGNOSIS — I200 Unstable angina: Secondary | ICD-10-CM | POA: Diagnosis not present

## 2019-10-16 DIAGNOSIS — R42 Dizziness and giddiness: Secondary | ICD-10-CM | POA: Diagnosis not present

## 2019-10-16 DIAGNOSIS — R0600 Dyspnea, unspecified: Secondary | ICD-10-CM | POA: Diagnosis not present

## 2019-10-16 DIAGNOSIS — Z01812 Encounter for preprocedural laboratory examination: Secondary | ICD-10-CM | POA: Insufficient documentation

## 2019-10-16 DIAGNOSIS — R5383 Other fatigue: Secondary | ICD-10-CM

## 2019-10-16 DIAGNOSIS — R0609 Other forms of dyspnea: Secondary | ICD-10-CM

## 2019-10-16 DIAGNOSIS — Z20822 Contact with and (suspected) exposure to covid-19: Secondary | ICD-10-CM | POA: Insufficient documentation

## 2019-10-16 DIAGNOSIS — R195 Other fecal abnormalities: Secondary | ICD-10-CM

## 2019-10-16 DIAGNOSIS — R079 Chest pain, unspecified: Secondary | ICD-10-CM | POA: Diagnosis not present

## 2019-10-16 MED ORDER — ISOSORBIDE MONONITRATE ER 30 MG PO TB24
30.0000 mg | ORAL_TABLET | Freq: Every day | ORAL | 3 refills | Status: DC
Start: 2019-10-16 — End: 2020-06-02

## 2019-10-16 NOTE — Patient Instructions (Signed)
Medication Instructions:  Your physician has recommended you start taking:  ImdurTake 1 tablet (30 mg total) by mouth daily.  *If you need a refill on your cardiac medications before your next appointment, please call your pharmacy*   Lab Work:  Your physician recommends that you have labs drawn today.  Please report to the PAT testing site ([facility] arts [facility]) on 10/16/19   immediately after your visit today for your DRIVE THRU covid testing that is required prior to your procedure. Following covid testing, please remain in quarantine. If you must be around others, please wash hands, avoid touching face and wear your mask.  If you have labs (blood work) drawn today and your tests are completely normal, you will receive your results only by: Marland Kitchen MyChart Message (if you have MyChart) OR . A paper copy in the mail If you have any lab test that is abnormal or we need to change your treatment, we will call you to review the results.   Testing/Procedures:     [address], [place] [address] Dept: [phone] Loc: [place]  10/16/2019  You are scheduled for a Cardiac Catheterization on Friday, May 28 with Dr. Kathlyn Sacramento.  1. Please arrive at the [facility] (Main Entrance A) at [facility]: [address] [place], [place] [address] at 7:00 AM (This time is two hours before your procedure to ensure your preparation). Free valet parking service is available.   Special note: Every effort is made to have your procedure done on time. Please understand that emergencies sometimes delay scheduled procedures.  2. Diet: Do not eat solid foods after midnight.  The patient may have clear liquids until 5am upon the day of the procedure.  3. Labs: Done   4. Medication instructions in preparation for your procedure:     On the morning of your  procedure, take your Aspirin and any morning medicines NOT listed above.  You may use sips of water.  5. Plan for one night stay--bring personal belongings. 6. Bring a current list of your medications and current insurance cards. 7. You MUST have a responsible person to drive you home. 8. Someone MUST be with you the first 24 hours after you arrive home or your discharge will be delayed. 9. Please wear clothes that are easy to get on and off and wear slip-on shoes.  Thank you for allowing [place] to care for you!   -- [facility]     Follow-Up: At [facility], you and your health needs are our priority.  As part of our continuing mission to provide you with exceptional heart care, we have created designated Provider Care Teams.  These Care Teams include your primary Cardiologist (physician) and Advanced Practice Providers (APPs -  Physician Assistants and Nurse Practitioners) who all work together to provide you with the care you need, when you need it.  We recommend signing up for the patient portal called "MyChart".  Sign up information is provided on this After Visit Summary.  MyChart is used to connect with patients for Virtual Visits (Telemedicine).  Patients are able to view lab/test results, encounter notes, upcoming appointments, etc.  Non-urgent messages can be sent to your provider as well.   To learn more about what you can do with MyChart, go to [email or web address].    Your next appointment:   7-9 days  The format  for your next appointment:   In Person  Provider:    You may see Kathlyn Sacramento, MD or the following Advanced Practice Providers on your designated Care Team:     [name], [place]     Other Instructions

## 2019-10-17 LAB — BASIC METABOLIC PANEL
BUN/Creatinine Ratio: 10 (ref 9–20)
BUN: 10 mg/dL (ref 6–24)
CO2: 25 mmol/L (ref 20–29)
Calcium: 9.5 mg/dL (ref 8.7–10.2)
Chloride: 105 mmol/L (ref 96–106)
Creatinine, Ser: 1.02 mg/dL (ref 0.76–1.27)
GFR calc Af Amer: 105 mL/min/{1.73_m2} (ref >59)
GFR calc non Af Amer: 91 mL/min/{1.73_m2} (ref >59)
Glucose: 93 mg/dL (ref 65–99)
Potassium: 4.7 mmol/L (ref 3.5–5.2)
Sodium: 143 mmol/L (ref 134–144)

## 2019-10-17 LAB — CBC
Hematocrit: 47 % (ref 37.5–51.0)
Hemoglobin: 16.2 g/dL (ref 13.0–17.7)
MCH: 31 pg (ref 26.6–33.0)
MCHC: 34.5 g/dL (ref 31.5–35.7)
MCV: 90 fL (ref 79–97)
Platelets: 240 10*3/uL (ref 150–450)
RBC: 5.22 x10E6/uL (ref 4.14–5.80)
RDW: 12.3 % (ref 11.6–15.4)
WBC: 5.3 10*3/uL (ref 3.4–10.8)

## 2019-10-17 LAB — SARS CORONAVIRUS 2 (TAT 6-24 HRS): SARS Coronavirus 2: NEGATIVE

## 2019-10-18 ENCOUNTER — Other Ambulatory Visit: Payer: Self-pay

## 2019-10-18 ENCOUNTER — Ambulatory Visit (HOSPITAL_COMMUNITY)
Admission: RE | Admit: 2019-10-18 | Discharge: 2019-10-18 | Disposition: A | Discharge status: Home / Self Care | Payer: BC Managed Care – PPO | Attending: Cardiovascular Disease | Admitting: Cardiovascular Disease

## 2019-10-18 ENCOUNTER — Encounter (HOSPITAL_COMMUNITY)
Admission: RE | Disposition: A | Discharge status: Home / Self Care | Payer: Self-pay | Source: Home / Self Care | Attending: Cardiovascular Disease

## 2019-10-18 DIAGNOSIS — K589 Irritable bowel syndrome without diarrhea: Secondary | ICD-10-CM | POA: Diagnosis not present

## 2019-10-18 DIAGNOSIS — R0602 Shortness of breath: Secondary | ICD-10-CM

## 2019-10-18 DIAGNOSIS — F329 Major depressive disorder, single episode, unspecified: Secondary | ICD-10-CM | POA: Diagnosis not present

## 2019-10-18 DIAGNOSIS — Z885 Allergy status to narcotic agent status: Secondary | ICD-10-CM | POA: Diagnosis not present

## 2019-10-18 DIAGNOSIS — Z888 Allergy status to other drugs, medicaments and biological substances status: Secondary | ICD-10-CM | POA: Diagnosis not present

## 2019-10-18 DIAGNOSIS — Z79899 Other long term (current) drug therapy: Secondary | ICD-10-CM | POA: Diagnosis not present

## 2019-10-18 DIAGNOSIS — Z8249 Family history of ischemic heart disease and other diseases of the circulatory system: Secondary | ICD-10-CM | POA: Insufficient documentation

## 2019-10-18 DIAGNOSIS — K219 Gastro-esophageal reflux disease without esophagitis: Secondary | ICD-10-CM | POA: Diagnosis not present

## 2019-10-18 DIAGNOSIS — R079 Chest pain, unspecified: Secondary | ICD-10-CM

## 2019-10-18 DIAGNOSIS — Z7982 Long term (current) use of aspirin: Secondary | ICD-10-CM | POA: Diagnosis not present

## 2019-10-18 DIAGNOSIS — F419 Anxiety disorder, unspecified: Secondary | ICD-10-CM | POA: Insufficient documentation

## 2019-10-18 DIAGNOSIS — I200 Unstable angina: Secondary | ICD-10-CM | POA: Insufficient documentation

## 2019-10-18 DIAGNOSIS — R0600 Dyspnea, unspecified: Secondary | ICD-10-CM

## 2019-10-18 HISTORY — PX: RIGHT/LEFT HEART CATH AND CORONARY ANGIOGRAPHY: CATH118266

## 2019-10-18 LAB — POCT I-STAT EG7
Acid-Base Excess: 2 mmol/L (ref 0.0–2.0)
Bicarbonate: 27.6 mmol/L (ref 20.0–28.0)
Calcium, Ion: 1.25 mmol/L (ref 1.15–1.40)
HCT: 44 % (ref 39.0–52.0)
Hemoglobin: 15 g/dL (ref 13.0–17.0)
O2 Saturation: 78 %
Potassium: 4 mmol/L (ref 3.5–5.1)
Sodium: 144 mmol/L (ref 135–145)
TCO2: 29 mmol/L (ref 22–32)
pCO2, Ven: 45.8 mmHg (ref 44.0–60.0)
pH, Ven: 7.388 (ref 7.250–7.430)
pO2, Ven: 43 mmHg (ref 32.0–45.0)

## 2019-10-18 LAB — POCT I-STAT 7, (LYTES, BLD GAS, ICA,H+H)
Acid-Base Excess: 2 mmol/L (ref 0.0–2.0)
Bicarbonate: 26.8 mmol/L (ref 20.0–28.0)
Calcium, Ion: 1.24 mmol/L (ref 1.15–1.40)
HCT: 44 % (ref 39.0–52.0)
Hemoglobin: 15 g/dL (ref 13.0–17.0)
O2 Saturation: 97 %
Potassium: 4 mmol/L (ref 3.5–5.1)
Sodium: 143 mmol/L (ref 135–145)
TCO2: 28 mmol/L (ref 22–32)
pCO2 arterial: 43 mmHg (ref 32.0–48.0)
pH, Arterial: 7.402 (ref 7.350–7.450)
pO2, Arterial: 95 mmHg (ref 83.0–108.0)

## 2019-10-18 SURGERY — RIGHT/LEFT HEART CATH AND CORONARY ANGIOGRAPHY
Anesthesia: LOCAL

## 2019-10-18 MED ORDER — HEPARIN SODIUM (PORCINE) 1000 UNIT/ML IJ SOLN
INTRAMUSCULAR | Status: DC | PRN
  Administered 2019-10-18: 4500 [IU] via INTRAVENOUS

## 2019-10-18 MED ORDER — SODIUM CHLORIDE 0.9 % WEIGHT BASED INFUSION
3.0000 mL/kg/h | INTRAVENOUS | Status: AC
  Administered 2019-10-18: 3 mL/kg/h via INTRAVENOUS

## 2019-10-18 MED ORDER — SODIUM CHLORIDE 0.9% FLUSH: 3.0000 mL | INTRAVENOUS | Status: DC | PRN

## 2019-10-18 MED ORDER — SODIUM CHLORIDE 0.9 % IV SOLN: 250.0000 mL | INTRAVENOUS | Status: DC | PRN

## 2019-10-18 MED ORDER — ACETAMINOPHEN 325 MG PO TABS: 650.0000 mg | ORAL_TABLET | ORAL | Status: DC | PRN

## 2019-10-18 MED ORDER — VERAPAMIL HCL 2.5 MG/ML IV SOLN
INTRAVENOUS | Status: AC
  Filled 2019-10-18: qty 2

## 2019-10-18 MED ORDER — SODIUM CHLORIDE 0.9 % IV SOLN: INTRAVENOUS | Status: DC

## 2019-10-18 MED ORDER — LIDOCAINE HCL (PF) 1 % IJ SOLN
INTRAMUSCULAR | Status: DC | PRN
  Administered 2019-10-18 (×2): 2 mL

## 2019-10-18 MED ORDER — IOHEXOL 350 MG/ML SOLN
INTRAVENOUS | Status: DC | PRN
  Administered 2019-10-18: 40 mL via INTRA_ARTERIAL

## 2019-10-18 MED ORDER — LIDOCAINE HCL (PF) 1 % IJ SOLN
INTRAMUSCULAR | Status: AC
  Filled 2019-10-18: qty 30

## 2019-10-18 MED ORDER — SODIUM CHLORIDE 0.9% FLUSH: 3.0000 mL | Freq: Two times a day (BID) | INTRAVENOUS | Status: DC

## 2019-10-18 MED ORDER — HEPARIN SODIUM (PORCINE) 1000 UNIT/ML IJ SOLN
INTRAMUSCULAR | Status: AC
  Filled 2019-10-18: qty 1

## 2019-10-18 MED ORDER — SODIUM CHLORIDE 0.9 % WEIGHT BASED INFUSION: 1.0000 mL/kg/h | INTRAVENOUS | Status: DC

## 2019-10-18 MED ORDER — ASPIRIN 81 MG PO CHEW: 81.0000 mg | CHEWABLE_TABLET | ORAL | Status: DC

## 2019-10-18 MED ORDER — HEPARIN (PORCINE) IN NACL 1000-0.9 UT/500ML-% IV SOLN
INTRAVENOUS | Status: DC | PRN
  Administered 2019-10-18 (×2): 500 mL

## 2019-10-18 MED ORDER — MIDAZOLAM HCL 2 MG/2ML IJ SOLN
INTRAMUSCULAR | Status: DC | PRN
  Administered 2019-10-18: 2 mg via INTRAVENOUS

## 2019-10-18 MED ORDER — VERAPAMIL HCL 2.5 MG/ML IV SOLN
INTRAVENOUS | Status: DC | PRN
  Administered 2019-10-18: 10 mL via INTRA_ARTERIAL

## 2019-10-18 MED ORDER — MIDAZOLAM HCL 2 MG/2ML IJ SOLN
INTRAMUSCULAR | Status: AC
  Filled 2019-10-18: qty 2

## 2019-10-18 MED ORDER — ONDANSETRON HCL 4 MG/2ML IJ SOLN: 4.0000 mg | Freq: Four times a day (QID) | INTRAMUSCULAR | Status: DC | PRN

## 2019-10-18 SURGICAL SUPPLY — 13 items
CATH BALLN WEDGE 5F 110CM (CATHETERS) ×1 IMPLANT
CATH INFINITI 5 FR JL3.5 (CATHETERS) ×1 IMPLANT
CATH INFINITI 5FR JK (CATHETERS) ×1 IMPLANT
DEVICE RAD COMP TR BAND LRG (VASCULAR PRODUCTS) ×1 IMPLANT
GLIDESHEATH SLEND SS 6F .021 (SHEATH) ×1 IMPLANT
GUIDEWIRE INQWIRE 1.5J.035X260 (WIRE) IMPLANT
INQWIRE 1.5J .035X260CM (WIRE) ×2
KIT HEART LEFT (KITS) ×2 IMPLANT
PACK CARDIAC CATHETERIZATION (CUSTOM PROCEDURE TRAY) ×2 IMPLANT
SHEATH GLIDE SLENDER 4/5FR (SHEATH) ×1 IMPLANT
SHEATH PROBE COVER 6X72 (BAG) ×1 IMPLANT
TRANSDUCER W/STOPCOCK (MISCELLANEOUS) ×2 IMPLANT
TUBING CIL FLEX 10 FLL-RA (TUBING) ×2 IMPLANT

## 2019-10-18 NOTE — Interval H&P Note (Signed)
Cath Lab Visit (complete for each Cath Lab visit)  Clinical Evaluation Leading to the Procedure:   ACS: No.  Non-ACS:    Anginal Classification: CCS III  Anti-ischemic medical therapy: Minimal Therapy (1 class of medications)  Non-Invasive Test Results: Low-risk stress test findings: cardiac mortality <1%/year  Prior CABG: No previous CABG      History and Physical Interval Note:  10/18/2019 11:13 AM  Sergio Garcia  has presented today for surgery, with the diagnosis of chest pain.  The various methods of treatment have been discussed with the patient and family. After consideration of risks, benefits and other options for treatment, the patient has consented to  Procedure(s): RIGHT/LEFT HEART CATH AND CORONARY ANGIOGRAPHY (N/A) as a surgical intervention.  The patient's history has been reviewed, patient examined, no change in status, stable for surgery.  I have reviewed the patient's chart and labs.  Questions were answered to the patient's satisfaction.     Kathlyn Sacramento

## 2019-10-18 NOTE — Research (Signed)
[place]  Informed Consent   Subject Name: Sergio Garcia  Subject met inclusion and exclusion criteria.  The informed consent form, study requirements and expectations were reviewed with the subject and questions and concerns were addressed prior to the signing of the consent form.  The subject verbalized understanding of the trail requirements.  The subject agreed to participate in the [facility] trial and signed the informed consent.  The informed consent was obtained prior to performance of any protocol-specific procedures for the subject.  A copy of the signed informed consent was given to the subject and a copy was placed in the subject's medical record.  Mena Goes. 10/18/2019, 0745 AM

## 2019-10-18 NOTE — Discharge Instructions (Signed)
DRINK PLENTY OF FLUIDS OVER THE NEXT 2-3 DAYS. Radial Site Care  This sheet gives you information about how to care for yourself after your procedure. Your health care provider may also give you more specific instructions. If you have problems or questions, contact your health care provider. What can I expect after the procedure? After the procedure, it is common to have:  Bruising and tenderness at the catheter insertion area. Follow these instructions at home: Medicines  Take over-the-counter and prescription medicines only as told by your health care provider. Insertion site care  Follow instructions from your health care provider about how to take care of your insertion site. Make sure you: ? Wash your hands with soap and water before you change your bandage (dressing). If soap and water are not available, use hand sanitizer. ? Change your dressing as told by your health care provider. ? Leave stitches (sutures), skin glue, or adhesive strips in place. These skin closures may need to stay in place for 2 weeks or longer. If adhesive strip edges start to loosen and curl up, you may trim the loose edges. Do not remove adhesive strips completely unless your health care provider tells you to do that.  Check your insertion site every day for signs of infection. Check for: ? Redness, swelling, or pain. ? Fluid or blood. ? Pus or a bad smell. ? Warmth.  Do not take baths, swim, or use a hot tub until your health care provider approves.  You may shower 24-48 hours after the procedure, or as directed by your health care provider. ? Remove the dressing and gently wash the site with plain soap and water. ? Pat the area dry with a clean towel. ? Do not rub the site. That could cause bleeding.  Do not apply powder or lotion to the site. Activity   For 24 hours after the procedure, or as directed by your health care provider: ? Do not flex or bend the affected arm. ? Do not push or pull  heavy objects with the affected arm. ? Do not drive yourself home from the hospital or clinic. You may drive 24 hours after the procedure unless your health care provider tells you not to. ? Do not operate machinery or power tools.  Do not lift anything that is heavier than 10 lb (4.5 kg), or the limit that you are told, until your health care provider says that it is safe.  Ask your health care provider when it is okay to: ? Return to work or school. ? Resume usual physical activities or sports. ? Resume sexual activity. General instructions  If the catheter site starts to bleed, raise your arm and put firm pressure on the site. If the bleeding does not stop, get help right away. This is a medical emergency.  If you went home on the same day as your procedure, a responsible adult should be with you for the first 24 hours after you arrive home.  Keep all follow-up visits as told by your health care provider. This is important. Contact a health care provider if:  You have a fever.  You have redness, swelling, or yellow drainage around your insertion site. Get help right away if:  You have unusual pain at the radial site.  The catheter insertion area swells very fast.  The insertion area is bleeding, and the bleeding does not stop when you hold steady pressure on the area.  Your arm or hand becomes pale, cool, tingly,   or numb. These symptoms may represent a serious problem that is an emergency. Do not wait to see if the symptoms will go away. Get medical help right away. Call your local emergency services (911 in the [place] Do not drive yourself to the hospital. Summary  After the procedure, it is common to have bruising and tenderness at the site.  Follow instructions from your health care provider about how to take care of your radial site wound. Check the wound every day for signs of infection.  Do not lift anything that is heavier than 10 lb (4.5 kg), or the limit that you are  told, until your health care provider says that it is safe. This information is not intended to replace advice given to you by your health care provider. Make sure you discuss any questions you have with your health care provider. Document Revised: 06/14/2017 Document Reviewed: 06/14/2017 Elsevier Patient Education  2020 Elsevier Inc.  

## 2019-10-23 ENCOUNTER — Telehealth: Payer: Self-pay | Admitting: Cardiovascular Disease

## 2019-10-23 NOTE — Telephone Encounter (Signed)
Patient was accidentally disconnected from call and would like to go over a few more things Please call to discuss

## 2019-10-23 NOTE — Telephone Encounter (Signed)
His job requires significant heavy lifting. I would like to keep him out for this week. He may return 6/7, with restriction of no lifting weights greater than 20 pounds until seen on 6/9.

## 2019-10-23 NOTE — Progress Notes (Signed)
Cardiology Office Note    Date:  10/30/2019   ID:  Sergio Garcia, DOB 06/27/1975, MRN [id number]  PCP:  [name], [name], MD  Cardiologist:  Kathlyn Sacramento, MD  Electrophysiologist:  None   Chief Complaint: Follow up  History of Present Illness:   Sergio Garcia is a 44 y.o. male with history of normal coronary arteries by LHC on 10/18/2019, Gilbert'ssyndrome, IBS, GERD, strong family history of CAD, PTSD,anxiety, and depression who presents for follow up of diagnostic R/LHC.  Remote treadmill stress test in 2019 performed at [facility] for atypical chest pain showed excellent exercise capacity, with the patient exercising for 11 minutes, and was negative for ischemia.  He was evaluated by Dr.Aridaas a new patient in 05/2018 for palpitations and shortness of breath with associated extreme fatigue and dizziness. At that time, he reported a syncopal episode, without injury,approximately 3 weeks prior while walking in his house. Zio patch showed predominant rhythm of sinus with an average heart rate of 75 bpm with 1 run of SVT lasting 4 beats along with rare PACs and PVCs. Echo showed an EF of 60 to 65%, normal LV cavity size, diastolic dysfunction, mildly dilated aortic root and ascending aorta, no significant valvular abnormalities.   He was seen on 09/26/2019, noting a 2 to 3-week history of exertional chest pain that radiated to the left shoulder, improved with rest, and was described as pressure with associated shortness of breath and fatigue. Lexiscan MPI on 10/07/2019, showed no significant ischemia with TWI noted in leads II, III, aVF, V5, and V6.  EF 55-65%.  No significant coronary artery calcium noted on CT images.  Overall, this was a low risk study.   He was seen in follow-up on 10/16/2019 with continued symptoms, possibly a little worse with noted exertional chest pain, dyspnea, fatigue, and dizziness. In this setting, he underwent diagnostic R/LHC on 10/18/2019 which showed  normal coronary arteries, normal LVEDP, right heart catheterization showed normal right and left filling pressures, normal pulmonary pressure, and normal cardiac output. Symptoms were not felt to be cardiac in origin based on the above study.  He comes in today accompanied by his girlfriend.  He continues to feel about the same as he had over his past several visits, though over the past couple of days he feels like his chest discomfort and shortness of breath have been a little bit worse.  He continues to note randomly occurring left-sided chest discomfort that radiates to the left shoulder blade with noted dyspnea, fatigue, and palpitations.  He does wonder if his symptoms could be related to receiving his Covid vaccine.   Labs independently reviewed: 09/2019 - BUN 10, SCr 1.02, potassium 4.7, HGB 16.2 PLT 240 01/2019-TSH normal, A1c 6.0, TC 173, TG 125, HDL 43, LDL 108, albumin 4.7, AST/ALT normal  Past Medical History:  Diagnosis Date  . Anxiety   . Degenerative disc disease   . Depression   . Enteritis   . Gastritis   . GERD (gastroesophageal reflux disease)   . Gilbert's syndrome    hyperbilirubinemia  . IBS (irritable bowel syndrome)   . Inguinal hernia   . Insomnia   . Prostatitis 2008    Past Surgical History:  Procedure Laterality Date  . BACK SURGERY     2 ruptured discs  . CHOLECYSTECTOMY  01/31/2014   GB polyp, chronic cholecystitis   . ESOPHAGOGASTRODUODENOSCOPY  09/2004   gastritis acute and duodenitis  . ESOPHAGOGASTRODUODENOSCOPY  01-14-14   Dr Allen Norris  .  EXPLORATORY LAPAROTOMY  Jan 2015   Dr Burt Knack  . LASIK    . RIGHT/LEFT HEART CATH AND CORONARY ANGIOGRAPHY N/A 10/18/2019   Procedure: RIGHT/LEFT HEART CATH AND CORONARY ANGIOGRAPHY;  Surgeon: Wellington Hampshire, MD;  Location: [facility];  Service: Cardiovascular;  Laterality: N/A;    Current Medications: Current Meds  Medication Sig  . cetirizine-pseudoephedrine (ZYRTEC-D) 5-120 MG tablet Take 1  tablet by mouth 2 (two) times daily.  . clonazePAM (KLONOPIN) 0.5 MG tablet Take 1 tablet (0.5 mg total) by mouth as directed. Take 1 tablet once a day up to 1-2 times a week if needed only for severe anxiety attacks (Patient taking differently: Take 0.5 mg by mouth See admin instructions. Take 1 tablet once a day up to 1-2 times a week if needed only for severe anxiety attacks)  . FLUoxetine (PROZAC) 40 MG capsule Take 1 capsule (40 mg total) by mouth daily.  . isosorbide mononitrate (IMDUR) 30 MG 24 hr tablet Take 1 tablet (30 mg total) by mouth daily.  . QUEtiapine (SEROQUEL) 25 MG tablet Take 1.5 tablets (37.5 mg total) by mouth at bedtime.  . SUMAtriptan (IMITREX) 50 MG tablet Take 1 tablet (50 mg total) by mouth as needed for migraine. May repeat in 2 hours if headache persists or recurs. No more than 2x per week. No more than 200 mg total/24 hours    Allergies:   Morphine and related, Morphine, and Tdap [tetanus-diphth-acell pertussis]   Social History   Socioeconomic History  . Marital status: Legally Separated    Spouse name: Not on file  . Number of children: 2  . Years of education: Not on file  . Highest education level: High school graduate  Occupational History  . Occupation: Music therapist: [employer]  Tobacco Use  . Smoking status: Never Smoker  . Smokeless tobacco: Never Used  Substance and Sexual Activity  . Alcohol use: Yes    Comment: occassional social  . Drug use: No  . Sexual activity: Yes  Other Topics Concern  . Not on file  Social History Narrative   Married.   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   Enjoys watching racing, Dealer, deer hunting.             Social Determinants of Health   Financial Resource Strain:   . Difficulty of Paying Living Expenses:   Food Insecurity:   . Worried About Charity fundraiser in the Last Year:   . Arboriculturist in the Last Year:   Transportation Needs:   .  Film/video editor (Medical):   Marland Kitchen Lack of Transportation (Non-Medical):   Physical Activity:   . Days of Exercise per Week:   . Minutes of Exercise per Session:   Stress:   . Feeling of Stress :   Social Connections:   . Frequency of Communication with Friends and Family:   . Frequency of Social Gatherings with Friends and Family:   . Attends Religious Services:   . Active Member of Clubs or Organizations:   . Attends Archivist Meetings:   Marland Kitchen Marital Status:      Family History:  The patient's family history includes Cancer in his maternal uncle and mother; Colon cancer in his maternal grandfather; Colon polyps in his mother; Diabetes in an other family member; Heart Problems in his paternal grandfather; Heart attack in his father;  Heart disease in his father; Pancreatic cancer in his maternal grandfather; Stroke in his cousin. There is no history of Mental illness.  ROS:   Review of Systems  Constitutional: Positive for malaise/fatigue. Negative for chills, diaphoresis, fever and weight loss.  HENT: Negative for congestion.   Eyes: Negative for discharge and redness.  Respiratory: Positive for shortness of breath. Negative for cough, sputum production and wheezing.   Cardiovascular: Positive for chest pain and palpitations. Negative for orthopnea, claudication, leg swelling and PND.  Gastrointestinal: Negative for abdominal pain, heartburn, nausea and vomiting.  Musculoskeletal: Positive for back pain. Negative for falls and myalgias.  Skin: Negative for rash.  Neurological: Positive for dizziness and weakness. Negative for tingling, tremors, sensory change, speech change, focal weakness and loss of consciousness.  Endo/Heme/Allergies: Does not bruise/bleed easily.  Psychiatric/Behavioral: Negative for substance abuse. The patient is not nervous/anxious.   All other systems reviewed and are negative.    EKGs/Labs/Other Studies Reviewed:    Studies reviewed were  summarized above. The additional studies were reviewed today:  Zio patch 05/2018: Normal sinus rhythm with an average heart rate of 75 bpm. One run of supraventricular tachycardia lasting 4 beats with a maximum heart rate 115 bpm. Rare PACs and PVCs. __________  2D echo 05/2018: 1. The left ventricle has normal systolic function of 123456. The cavity  size is normal. There is no left ventricular wall thickness. Echo evidence  of impaired relaxation diastolic filling patterns.  2. Normal left atrial size.  3. Normal right atrial size.  4. Normal tricuspid valve.  5. Mild dilatation of the aortic root and ascending aorta.  6. The aortic valve normal in structure and function.Trileaflet.  7. There is normal RV systolic function. __________  [facility] MPI 10/07/2019:  T wave inversion was noted during stress in the II, III, aVF, V5 and V6 leads.  The study is normal.  This is a low risk study.  The left ventricular ejection fraction is normal (55-65%).  Suboptimal study due to GI uptake. __________  [facility] 09/2019: 1.  Normal coronary arteries. 2.  Left ventricular angiography was not performed.  EF was normal by echo.  Normal left ventricular end-diastolic pressure 3.  Right heart catheterization showed normal right and left-sided filling pressures, normal pulmonary pressure and normal cardiac output.  Recommendations: Chest pain and shortness of breath seems to be not cardiac in origin based on normal right and left cardiac catheterization.   EKG:  EKG is ordered today.  The EKG ordered today demonstrates NSR, 70 bpm, nonspecific ST-T changes, unchanged from prior  Recent Labs: 01/24/2019: ALT 31; TSH 1.400 10/16/2019: BUN 10; Creatinine, Ser 1.02; Platelets 240 10/18/2019: Hemoglobin 15.0; Potassium 4.0; Sodium 144  Recent Lipid Panel    Component Value Date/Time   CHOL 173 01/24/2019 0817   TRIG 125 01/24/2019 0817   HDL 43 01/24/2019 0817   CHOLHDL 4.0 01/24/2019  0817   CHOLHDL 4 03/02/2018 0830   VLDL 16.2 03/02/2018 0830   LDLCALC 108 (H) 01/24/2019 0817    PHYSICAL EXAM:    VS:  BP 120/80 (BP Location: Left Arm, Patient Position: Sitting, Cuff Size: Normal)   Pulse 70   Ht 6\' 2"  (1.88 m)   Wt 200 lb 4 oz (90.8 kg)   SpO2 98%   BMI 25.71 kg/m   BMI: Body mass index is 25.71 kg/m.  Physical Exam  Constitutional: He is oriented to person, place, and time. He appears well-developed and well-nourished.  HENT:  Head:  Normocephalic and atraumatic.  Eyes: Right eye exhibits no discharge. Left eye exhibits no discharge.  Neck: No JVD present.  Cardiovascular: Normal rate, regular rhythm, S1 normal, S2 normal and normal heart sounds. Exam reveals no distant heart sounds, no friction rub, no midsystolic click and no opening snap.  No murmur heard. Pulses:      Posterior tibial pulses are 2+ on the right side and 2+ on the left side.  Pulmonary/Chest: Effort normal and breath sounds normal. No respiratory distress. He has no decreased breath sounds. He has no wheezes. He has no rales. He exhibits no tenderness.  Abdominal: Soft. He exhibits no distension. There is no abdominal tenderness.  Musculoskeletal:        General: No edema.     Cervical back: Normal range of motion.  Neurological: He is alert and oriented to person, place, and time.  Skin: Skin is warm and dry. No cyanosis. Nails show no clubbing.  Psychiatric: He has a normal mood and affect. His speech is normal and behavior is normal. Judgment and thought content normal.    Wt Readings from Last 3 Encounters:  10/30/19 200 lb 4 oz (90.8 kg)  10/18/19 202 lb (91.6 kg)  10/16/19 200 lb 4 oz (90.8 kg)     ASSESSMENT & PLAN:   1. Chest pain/dyspnea/palpitations with normal coronary arteries: Work-up thus far has demonstrated a normal Lexiscan MPI with no significant coronary artery calcification noted on CT corrected images.  Normal right and left cardiac cath.  Previously  unrevealing echo and Zio patch.  He continues to note symptoms of chest pain, dyspnea, palpitations, fatigue, and dizziness.  More recently, his symptoms seem to have presented themselves after receiving his second Covid vaccine.  PE appears to be less likely with a heart rate of 70 bpm and oxygen saturation of 98% on room air in the office.  Given recent Lexiscan MPI and diagnostic R/LHC we will attempt to defer further radiation at this time.  Cannot exclude potential myocarditis following Covid vaccine.  Pericarditis seems less likely based off presentation and EKG.  We will check an echo, Zio patch, D-dimer, CRP, and sed rate.  Further recommendations pending.   Disposition: F/u with Dr. Fletcher Anon or an APP in 1 month.   Medication Adjustments/Labs and Tests Ordered: Current medicines are reviewed at length with the patient today.  Concerns regarding medicines are outlined above. Medication changes, Labs and Tests ordered today are summarized above and listed in the Patient Instructions accessible in Encounters.   Signed, [name], PA-C 10/30/2019 10:30 AM     [address] [place], [place] [address] [phone]

## 2019-10-23 NOTE — Telephone Encounter (Signed)
Patient would like to know when he can return to work. States he had a catherization last week, and would prefer not to go back until next week, due to his arm being sore. Please call and advise.

## 2019-10-23 NOTE — Telephone Encounter (Signed)
Spoke with the patient. Patient had a R/L heart cath on 10/18/19. (no pci) Patient sts that his cath site (right wrist) is still sore, it sis dry, intact, with no sign of infection. He has not returned to work as yet. He was contacted by his job this morning to see when he would be able to return. He is a heavy Engineer, building services.  Patient sts that he was told by [name], PA that he should remain out of work until the end of the week.  Advised the patient that I will fwd the msg to [facility] to advised and call back with his response and recommendation.

## 2019-10-23 NOTE — Telephone Encounter (Signed)
[name] sent in [place]. Call to patient to make him aware of instruction from [name], [place].   No further questions at this time.

## 2019-10-29 ENCOUNTER — Telehealth: Payer: Self-pay | Admitting: Physician Assistant

## 2019-10-29 NOTE — Telephone Encounter (Signed)
Patient requesting wife to come to visit.

## 2019-10-30 ENCOUNTER — Other Ambulatory Visit
Admission: RE | Admit: 2019-10-30 | Discharge: 2019-10-30 | Disposition: A | Discharge status: Home / Self Care | Payer: BC Managed Care – PPO | Source: Ambulatory Visit | Attending: Physician Assistant | Admitting: Physician Assistant

## 2019-10-30 ENCOUNTER — Ambulatory Visit (INDEPENDENT_AMBULATORY_CARE_PROVIDER_SITE_OTHER): Payer: BC Managed Care – PPO

## 2019-10-30 ENCOUNTER — Ambulatory Visit (INDEPENDENT_AMBULATORY_CARE_PROVIDER_SITE_OTHER): Payer: BC Managed Care – PPO | Admitting: Physician Assistant

## 2019-10-30 ENCOUNTER — Encounter: Payer: Self-pay | Admitting: Physician Assistant

## 2019-10-30 ENCOUNTER — Other Ambulatory Visit: Payer: Self-pay

## 2019-10-30 VITALS — BP 120/80 | HR 70 | Ht 74.0 in | Wt 200.2 lb

## 2019-10-30 DIAGNOSIS — I208 Other forms of angina pectoris: Secondary | ICD-10-CM | POA: Diagnosis not present

## 2019-10-30 DIAGNOSIS — R002 Palpitations: Secondary | ICD-10-CM | POA: Diagnosis not present

## 2019-10-30 DIAGNOSIS — R5383 Other fatigue: Secondary | ICD-10-CM

## 2019-10-30 DIAGNOSIS — R0602 Shortness of breath: Secondary | ICD-10-CM

## 2019-10-30 DIAGNOSIS — R42 Dizziness and giddiness: Secondary | ICD-10-CM

## 2019-10-30 LAB — FIBRIN DERIVATIVES D-DIMER (ARMC ONLY): Fibrin derivatives D-dimer (ARMC): 210.35 ng{FEU}/mL (ref 0.00–499.00)

## 2019-10-30 LAB — C-REACTIVE PROTEIN: CRP: 0.5 mg/dL

## 2019-10-30 LAB — SEDIMENTATION RATE: Sed Rate: 3 mm/hr (ref 0–15)

## 2019-10-30 NOTE — Patient Instructions (Signed)
Medication Instructions:  Your physician recommends that you continue on your current medications as directed. Please refer to the Current Medication list given to you today.  *If you need a refill on your cardiac medications before your next appointment, please call your pharmacy*   Lab Work:  - Please go to the [facility]. You will check in at the front desk to the right as you walk into the atrium. Valet Parking is offered if needed. - No appointment needed. You may go any day between 7 am and 6 pm. If you have labs (blood work) drawn today and your tests are completely normal, you will receive your results only by: Marland Kitchen MyChart Message (if you have MyChart) OR . A paper copy in the mail If you have any lab test that is abnormal or we need to change your treatment, we will call you to review the results.   Testing/Procedures:  1.  Your physician has requested that you have an echocardiogram. Echocardiography is a painless test that uses sound waves to create images of your heart. It provides your doctor with information about the size and shape of your heart and how well your heart's chambers and valves are working. This procedure takes approximately one hour. There are no restrictions for this procedure.  2. Your physician has recommended that you wear a Zio monitor. This monitor is a medical device that records the heart's electrical activity. Doctors most often use these monitors to diagnose arrhythmias. Arrhythmias are problems with the speed or rhythm of the heartbeat. The monitor is a small device applied to your chest. You can wear one while you do your normal daily activities. While wearing this monitor if you have any symptoms to push the button and record what you felt. Once you have worn this monitor for the period of time provider prescribed (Usually 14 days), you will return the monitor device in the postage paid box. Once it is returned they will download the data collected  and provide [place] with a report which the provider will then review and we will call you with those results. Important tips:  1. Avoid showering during the first 24 hours of wearing the monitor. 2. Avoid excessive sweating to help maximize wear time. 3. Do not submerge the device, no hot tubs, and no swimming pools. 4. Keep any lotions or oils away from the patch. 5. After 24 hours you may shower with the patch on. Take brief showers with your back facing the shower head.  6. Do not remove patch once it has been placed because that will interrupt data and decrease adhesive wear time. 7. Push the button when you have any symptoms and write down what you were feeling. 8. Once you have completed wearing your monitor, remove and place into box which has postage paid and place in your outgoing mailbox.  9. If for some reason you have misplaced your box then call our office and we can provide another box and/or mail it off for you.        Follow-Up: At [facility], you and your health needs are our priority.  As part of our continuing mission to provide you with exceptional heart care, we have created designated Provider Care Teams.  These Care Teams include your primary Cardiologist (physician) and Advanced Practice Providers (APPs -  Physician Assistants and Nurse Practitioners) who all work together to provide you with the care you need, when you need it.  We recommend signing up  for the patient portal called "MyChart".  Sign up information is provided on this After Visit Summary.  MyChart is used to connect with patients for Virtual Visits (Telemedicine).  Patients are able to view lab/test results, encounter notes, upcoming appointments, etc.  Non-urgent messages can be sent to your provider as well.   To learn more about what you can do with MyChart, go to [email or web address].    Your next appointment:   1 month(s)  The format for your next appointment:   In Person  Provider:     You may see Kathlyn Sacramento, MD or one of the following Advanced Practice Providers on your designated Care Team:    Murray Hodgkins, NP  [name], PA-C  Marrianne Mood, PA-C    Other Instructions n/a

## 2019-11-13 ENCOUNTER — Ambulatory Visit: Payer: BC Managed Care – PPO

## 2019-11-13 ENCOUNTER — Ambulatory Visit: Admission: RE | Admit: 2019-11-13 | Payer: BC Managed Care – PPO | Source: Ambulatory Visit

## 2019-11-13 ENCOUNTER — Telehealth: Payer: Self-pay | Admitting: Physician Assistant

## 2019-11-13 NOTE — Telephone Encounter (Signed)
Spoke with patients girlfriend and she states that he did wear monitor for one week and did have some episodes while wearing it. Reviewed options were to mail monitor back and we could see if that had enough information or we could send additional monitor for him to wear another week. She will check to see which he would like to do. 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 When he got home he was complaining about wrist pain and that it had swollen up. Instructed her to hold firm pressure to that area for a full 20 minutes and then use ice as well for any discomfort. Patient is coming in on Friday for echocardiogram and will see if someone can take a look at his wrist. Will make providers aware that will be here that day. She verbalized understanding of our conversation, agreement with plan, and had no further questions at this time. Instructed her to call back if they want additional monitor.

## 2019-11-13 NOTE — Telephone Encounter (Signed)
Patients zio monitor fell off. Patients wife states it came on and off but completely fell off less than a week of wearing it  ALSO Patients arm seems to be bothering him since heart cath. Yesterday patient worked hard and arm started to swell badly near wrist and is sore.  Please advise

## 2019-11-14 NOTE — Telephone Encounter (Signed)
I doubt a vascular issue given that his cath was almost 1 month ago.  One of our nurses can examine his radial pulse and check for swelling if needed.

## 2019-11-15 ENCOUNTER — Ambulatory Visit (INDEPENDENT_AMBULATORY_CARE_PROVIDER_SITE_OTHER): Payer: BC Managed Care – PPO

## 2019-11-15 ENCOUNTER — Other Ambulatory Visit: Payer: Self-pay

## 2019-11-15 DIAGNOSIS — R002 Palpitations: Secondary | ICD-10-CM

## 2019-11-15 DIAGNOSIS — R0602 Shortness of breath: Secondary | ICD-10-CM | POA: Diagnosis not present

## 2019-11-15 NOTE — Telephone Encounter (Signed)
Patient wrist assessed while he was in office for echocardiogram. R wrist with 2+ radial pulse, normal capillary refill. No evidence of hematoma, infection, ecchymosis, nor swelling.   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 Reports discomfort is a "sharp" pain that occurs intermittently. Likely nerve irritation. Encouraged to rest wrist as able and use wrist guard if needed. May use OTC Tylenol as needed, discussed with him. Reassurance provided.   Loel Dubonnet, NP

## 2019-11-18 ENCOUNTER — Telehealth: Payer: Self-pay | Admitting: Cardiovascular Disease

## 2019-11-18 ENCOUNTER — Telehealth: Payer: Self-pay

## 2019-11-18 NOTE — Telephone Encounter (Signed)
I attempted to call the patient on his # to follow up with his symptoms. No answer- I left a message to please call back and that I would try his girlfriend (ok per DPR).  I was able to call and speak with the patient's girlfriend, Sergio Garcia. She states that patient has been having intermittently elevated heart rates for ~ 3 months. He wore a ZIO monitor that is in transit to [employer] for uploading.  Per Sergio Garcia, the patient's HR yesterday was 160 bpm. He was 153 bpm this morning without doing any activity. He is currently ~ 93 bpm this afternoon. His BP has not been checked, but Sergio Garcia, who is an EMT, feels like he may be getting hypotensive with his episodes as he looks pale and like he might pass out.   He is not currently on any rate controlling medications.   Sergio Garcia would like the patient to be seen this week. I do not currently have any openings.  I have advised Sergio Garcia I will forward this message to Dr. Fletcher Anon to review and see if he feels like we need to work the patient in what further recommendations would be. Sergio Garcia is aware we will call her back tomorrow. She voices understanding and is agreeable.

## 2019-11-18 NOTE — Telephone Encounter (Signed)
-----   Message from Rise Mu, PA-C sent at 11/16/2019  7:05 AM EDT ----- Echo showed low normal pump function, normal wall motion, normal relaxation of the heart, trivially leaky mitral valve and borderline enlargement of the aortic root. No significant fluid buildup around the heart, making inflammation unlikely. Reassuring study.

## 2019-11-18 NOTE — Telephone Encounter (Signed)
See other u/s result call for further detail.

## 2019-11-18 NOTE — Telephone Encounter (Signed)
Call to patient to review echo results.    Pt verbalized understanding and has no further questions at this time.    Advised pt to call for any further questions or concerns.  No further orders.  Confirming upcoming appt.

## 2019-11-18 NOTE — Telephone Encounter (Signed)
Patient calling to discuss recent echo testing results    Per wife patient still not feeling well and wants to talk or see someone asap  Please call

## 2019-11-18 NOTE — Telephone Encounter (Signed)
STAT if HR is under 50 or over 120 (normal HR is 60-100 beats per minute)  1) What is your heart rate? 95 now   2) Do you have a log of your heart rate readings (document readings)? Yesterday 160 this morning 152, jumps around a lot   3) Do you have any other symptoms? Lightheadedness, fatigue, SOB   Patient girlfriend calling - scheduled first available appt 7-6 - wants sooner Please call to discuss [phone]

## 2019-11-19 NOTE — Telephone Encounter (Signed)
Can we expedite the results for his Zio monitor to see what is going on?

## 2019-11-19 NOTE — Progress Notes (Signed)
Cardiology Office Note    Date:  11/26/2019   ID:  Sergio Garcia, DOB [birth year], MRN [id number]  PCP:  [name], [name], MD  Cardiologist:  Kathlyn Sacramento, MD  Electrophysiologist:  None   Chief Complaint: Follow-up  History of Present Illness:   Sergio Garcia is a 44 y.o. male with history of normal coronary arteries by LHC on 10/18/2019,  paroxysmal SVT, Gilbert'ssyndrome, IBS, GERD, strong family history of CAD, PTSD,anxiety, and depression who presents forfollow up of  chest pain, dyspnea, and palpitations.  Remote treadmill stress test in 2019 performed at [facility] for atypical chest pain showed excellent exercise capacity, with the patient exercising for 11 minutes, and was negative for ischemia.  He was evaluated by Dr.Aridaas a new patient in 05/2018 for palpitations and shortness of breath with associated extreme fatigue and dizziness. At that time, he reported a syncopal episode, without injury,approximately 3 weeks prior while walking in his house. Zio patch showed predominant rhythm of sinus with an average heart rate of 75 bpm with 1 run of SVT lasting 4 beats along with rare PACs and PVCs. Echo showed an EF of 60 to 65%, normal LV cavity size, diastolic dysfunction, mildly dilated aortic root and ascending aorta, no significant valvular abnormalities.  He was seen on 09/26/2019, noting a2 to 3-week history of exertional chest pain that radiatedto the left shoulder,improvedwith rest, and wasdescribed as pressure with associated shortness of breath and fatigue. Lexiscan MPI on 10/07/2019, showed no significant ischemia with TWI noted in leads II, III, aVF, V5, and V6. EF 55-65%. No significant coronary artery calcium noted on CT images. Overall, this was a low risk study.  He was seen in follow-up on 10/16/2019 with continued symptoms, possibly a little worse with noted exertional chest pain, dyspnea, fatigue, and dizziness. In this setting, he underwent  diagnostic R/LHC on 10/18/2019 which showed normal coronary arteries, normal LVEDP, right heart catheterization showed normal right and left filling pressures, normal pulmonary pressure, and normal cardiac output. Symptoms were not felt to be cardiac in origin based on the above study.  He was last seen in the office on 10/30/2019 and continued to feel about the same as he had over the past several visits.  He continues to note randomly occurring left-sided chest discomfort that radiated to the left shoulder blade with associated dyspnea, fatigue, and palpitations.  Given recent COVID-19 vaccine he underwent repeat echo on 11/15/2019 showed low normal LV SF with an EF of 50 to 55%, no regional wall motion abnormalities, normal LV diastolic function, normal RV systolic function and ventricular cavity size, trivial mitral regurgitation, and borderline dilatation of the aortic root measuring 38 mm.  Labs obtained at that time showed a normal D-dimer, sed rate, and CRP.  Repeat Zio patch monitoring showed predominant rhythm of sinus with an average heart rate of 76 bpm, 2 short runs of SVT with the longest lasting 8 beats with a rate of 144 bpm, rare PACs and PVCs.  Patient reported symptoms including shortness of breath, dizziness, and lightheadedness occurred while the patient was in sinus rhythm.  The 2 episodes of SVT were not associated with reported symptoms.  He indicated he was unable to trigger the device with episodes based on what he may have been doing at work at that time.    He comes in today continuing to feel the same as he has the past several visits, continuing to note profound exertional fatigue and dyspnea with substernal chest pressure.  He was recently started on metoprolol 25 mg daily in the setting of his above Zio patch which demonstrated paroxysmal SVT.  With this, he did note some improvement in symptoms though not resolution.  He last had profound fatigue on 7/6 when he was unloading a 4  wheeler from his trailer.  Of note, he did not take metoprolol that morning.  He does feel like he had a larger SVT burden and what his Zio patch showed and report he was unable to trigger all of his episodes of palpitations as he was busy at work at times.  He reports prior issues with anxiety have felt different than what he is currently experiencing.   Labs independently reviewed: 09/2019 - BUN 10, SCr 1.02, potassium 4.7, HGB 16.2 PLT 240 01/2019-TSH normal, A1c 6.0, TC 173, TG 125, HDL 43, LDL 108, albumin 4.7, AST/ALTnormal  Past Medical History:  Diagnosis Date  . Anxiety   . Degenerative disc disease   . Depression   . Enteritis   . Gastritis   . GERD (gastroesophageal reflux disease)   . Gilbert's syndrome    hyperbilirubinemia  . IBS (irritable bowel syndrome)   . Inguinal hernia   . Insomnia   . Prostatitis 2008    Past Surgical History:  Procedure Laterality Date  . BACK SURGERY     2 ruptured discs  . CHOLECYSTECTOMY  01/31/2014   GB polyp, chronic cholecystitis   . ESOPHAGOGASTRODUODENOSCOPY  09/2004   gastritis acute and duodenitis  . ESOPHAGOGASTRODUODENOSCOPY  01-14-14   Dr Allen Norris  . EXPLORATORY LAPAROTOMY  Jan 2015   Dr Burt Knack  . LASIK    . RIGHT/LEFT HEART CATH AND CORONARY ANGIOGRAPHY N/A 10/18/2019   Procedure: RIGHT/LEFT HEART CATH AND CORONARY ANGIOGRAPHY;  Surgeon: Wellington Hampshire, MD;  Location: [facility];  Service: Cardiovascular;  Laterality: N/A;    Current Medications: Current Meds  Medication Sig  . cetirizine-pseudoephedrine (ZYRTEC-D) 5-120 MG tablet Take 1 tablet by mouth 2 (two) times daily.  . clonazePAM (KLONOPIN) 0.5 MG tablet Take 1 tablet (0.5 mg total) by mouth 2 (two) times daily as needed for anxiety.  Marland Kitchen FLUoxetine (PROZAC) 40 MG capsule Take 1 capsule (40 mg total) by mouth daily.  . isosorbide mononitrate (IMDUR) 30 MG 24 hr tablet Take 1 tablet (30 mg total) by mouth daily.  . metoprolol succinate (TOPROL XL) 25 MG 24  hr tablet Take 1 tablet (25 mg total) by mouth daily.  . QUEtiapine (SEROQUEL) 25 MG tablet Take 1.5 tablets (37.5 mg total) by mouth at bedtime.  . SUMAtriptan (IMITREX) 50 MG tablet Take 1 tablet (50 mg total) by mouth as needed for migraine. May repeat in 2 hours if headache persists or recurs. No more than 2x per week. No more than 200 mg total/24 hours    Allergies:   Morphine and related, Morphine, and Tdap [tetanus-diphth-acell pertussis]   Social History   Socioeconomic History  . Marital status: Legally Separated    Spouse name: Not on file  . Number of children: 2  . Years of education: Not on file  . Highest education level: High school graduate  Occupational History  . Occupation: Music therapist: [employer]  Tobacco Use  . Smoking status: Never Smoker  . Smokeless tobacco: Never Used  Vaping Use  . Vaping Use: Never used  Substance and Sexual Activity  . Alcohol use: Yes    Comment: occassional social  . Drug use: No  .  Sexual activity: Yes  Other Topics Concern  . Not on file  Social History Narrative   Married.   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   Enjoys watching racing, Dealer, deer hunting.             Social Determinants of Health   Financial Resource Strain:   . Difficulty of Paying Living Expenses:   Food Insecurity:   . Worried About Charity fundraiser in the Last Year:   . Arboriculturist in the Last Year:   Transportation Needs:   . Film/video editor (Medical):   Marland Kitchen Lack of Transportation (Non-Medical):   Physical Activity:   . Days of Exercise per Week:   . Minutes of Exercise per Session:   Stress:   . Feeling of Stress :   Social Connections:   . Frequency of Communication with Friends and Family:   . Frequency of Social Gatherings with Friends and Family:   . Attends Religious Services:   . Active Member of Clubs or Organizations:   . Attends Archivist Meetings:     Marland Kitchen Marital Status:      Family History:  The patient's family history includes Cancer in his maternal uncle and mother; Colon cancer in his maternal grandfather; Colon polyps in his mother; Diabetes in an other family member; Heart Problems in his paternal grandfather; Heart attack in his father; Heart disease in his father; Pancreatic cancer in his maternal grandfather; Stroke in his cousin. There is no history of Mental illness.  ROS:   Review of Systems  Constitutional: Positive for malaise/fatigue. Negative for chills, diaphoresis, fever and weight loss.  HENT: Negative for congestion.   Eyes: Negative for discharge and redness.  Respiratory: Positive for shortness of breath. Negative for cough, sputum production and wheezing.   Cardiovascular: Positive for chest pain and palpitations. Negative for orthopnea, claudication, leg swelling and PND.  Gastrointestinal: Negative for abdominal pain, heartburn, nausea and vomiting.  Musculoskeletal: Negative for falls and myalgias.  Skin: Negative for rash.  Neurological: Positive for weakness. Negative for dizziness, tingling, tremors, sensory change, speech change, focal weakness and loss of consciousness.  Endo/Heme/Allergies: Does not bruise/bleed easily.  Psychiatric/Behavioral: Negative for substance abuse. The patient is nervous/anxious.   All other systems reviewed and are negative.    EKGs/Labs/Other Studies Reviewed:    Studies reviewed were summarized above. The additional studies were reviewed today:  Zio patch 05/2018: Normal sinus rhythm with an average heart rate of 75 bpm. One run of supraventricular tachycardia lasting 4 beats with a maximum heart rate 115 bpm. Rare PACs and PVCs. __________  2D echo 05/2018: 1. The left ventricle has normal systolic function of 06-23%. The cavity  size is normal. There is no left ventricular wall thickness. Echo evidence  of impaired relaxation diastolic filling patterns.  2. Normal  left atrial size.  3. Normal right atrial size.  4. Normal tricuspid valve.  5. Mild dilatation of the aortic root and ascending aorta.  6. The aortic valve normal in structure and function.Trileaflet.  7. There is normal RV systolic function. __________  [facility] MPI 10/07/2019:  T wave inversion was noted during stress in the II, III, aVF, V5 and V6 leads.  The study is normal.  This is a low risk study.  The left ventricular ejection fraction is normal (55-65%).  Suboptimal study due to GI uptake. __________  [facility] 10/18/2019: 1. Normal  coronary arteries. 2. Left ventricular angiography was not performed. EF was normal by echo. Normal left ventricular end-diastolic pressure 3. Right heart catheterization showed normal right and left-sided filling pressures, normal pulmonary pressure and normal cardiac output.  Recommendations: Chest pain and shortness of breath seems to be not cardiac in origin based on normal right and left cardiac catheterization. __________  2D echo 11/15/2019: 1. Left ventricular ejection fraction, by estimation, is 50 to 55%. The  left ventricle has low normal function. The left ventricle has no regional  wall motion abnormalities. Left ventricular diastolic parameters were  normal.  2. Right ventricular systolic function is normal. The right ventricular  size is normal.  3. The mitral valve is normal in structure. Trivial mitral valve  regurgitation.  4. The aortic valve is tricuspid. Aortic valve regurgitation is not  visualized. No aortic stenosis is present.  5. Aortic dilatation noted. There is borderline dilatation of the aortic  root measuring 38 mm.  6. The inferior vena cava is normal in size with greater than 50%  respiratory variability, suggesting right atrial pressure of 3 mmHg. __________  Elwyn Reach patch 10/2019: Normal sinus rhythm with an average heart rate of 76 bpm. 2 short runs of SVT the longest lasted only 8 beats  with a rate of 144 bpm. Rare PACs and rare PVCs. Reported symptoms included shortness of breath, dizziness and lightheadedness.  He is noted to be in sinus rhythm during these reported symptoms. The 2 SVT episodes were not associated with symptoms.    EKG:  EKG is ordered today.  The EKG ordered today demonstrates NSR, 61 bpm, no acute ST-T changes  Recent Labs: 01/24/2019: ALT 31; TSH 1.400 10/16/2019: BUN 10; Creatinine, Ser 1.02; Platelets 240 10/18/2019: Hemoglobin 15.0; Potassium 4.0; Sodium 144  Recent Lipid Panel    Component Value Date/Time   CHOL 173 01/24/2019 0817   TRIG 125 01/24/2019 0817   HDL 43 01/24/2019 0817   CHOLHDL 4.0 01/24/2019 0817   CHOLHDL 4 03/02/2018 0830   VLDL 16.2 03/02/2018 0830   LDLCALC 108 (H) 01/24/2019 0817    PHYSICAL EXAM:    VS:  BP (!) 124/92 (BP Location: Left Arm, Patient Position: Sitting, Cuff Size: Normal)   Pulse 61   Ht 6\' 2"  (1.88 m)   Wt 198 lb 6 oz (90 kg)   SpO2 98%   BMI 25.47 kg/m   BMI: Body mass index is 25.47 kg/m.  Physical Exam Constitutional:      Appearance: He is well-developed.  HENT:     Head: Normocephalic and atraumatic.  Eyes:     General:        Right eye: No discharge.        Left eye: No discharge.  Neck:     Vascular: No JVD.  Cardiovascular:     Rate and Rhythm: Normal rate and regular rhythm.     Pulses: No midsystolic click and no opening snap.          Posterior tibial pulses are 2+ on the right side and 2+ on the left side.     Heart sounds: Normal heart sounds, S1 normal and S2 normal. Heart sounds not distant. No murmur heard.  No friction rub.  Pulmonary:     Effort: Pulmonary effort is normal. No respiratory distress.     Breath sounds: Normal breath sounds. No decreased breath sounds, wheezing or rales.  Chest:     Chest wall: No tenderness.  Abdominal:  General: There is no distension.     Palpations: Abdomen is soft.     Tenderness: There is no abdominal tenderness.    Musculoskeletal:     Cervical back: Normal range of motion.  Skin:    General: Skin is warm and dry.     Nails: There is no clubbing.  Neurological:     Mental Status: He is alert and oriented to person, place, and time.  Psychiatric:        Speech: Speech normal.        Behavior: Behavior normal.        Thought Content: Thought content normal.        Judgment: Judgment normal.     Wt Readings from Last 3 Encounters:  11/26/19 198 lb 6 oz (90 kg)  10/30/19 200 lb 4 oz (90.8 kg)  10/18/19 202 lb (91.6 kg)     ASSESSMENT & PLAN:   1. Chest pain/dyspnea/dizziness: Symptoms overall are unchanged.  He has undergone thorough cardiac work-up to include Lexiscan MPI, echo, Zio patch, and R/LHC which have been largely unrevealing outside of paroxysmal SVT.  Both patient and fianc feel like his SVT burden is greater than what was captured on the Zio patch.  In this setting, recommend he obtain a Kardia device to monitor.  Currently, no indication for repeat ZIO monitoring or ILR.  Schedule CPX.    2. Paroxysmal SVT/palpitations: Recent Zio with 2 episodes of SVT, longest lasting 8 beats.  There is concern he may be having more SVT burden than what appeared on his Zio patch. Obtain Kardia device for [place] to review concerning rhythm strips.  Recent labs unrevealing as above.   3. Borderline aortic root dilatation: Optimal blood pressure and heart rate control.  Follow-up periodic echo.  4. Anxiety: Current episodes feel different.  Managed by psychiatry.    Disposition: F/u with me after CPX.   Medication Adjustments/Labs and Tests Ordered: Current medicines are reviewed at length with the patient today.  Concerns regarding medicines are outlined above. Medication changes, Labs and Tests ordered today are summarized above and listed in the Patient Instructions accessible in Encounters.   Signed, [name], PA-C 11/26/2019 11:10 AM     [facility] [address]  [address] [place], [place] [address] [phone]

## 2019-11-19 NOTE — Telephone Encounter (Signed)
Contacted Meredith at [employer]. She will expedite the patients zio results and they should be available with in 24 hours.  DPR on file. Spoke with the patients girlfriend Nevin Bloodgood. Adv her that Dr. Fletcher Anon has requested that the patients zio results be expedited so that they will be available for him to review.  Nevin Bloodgood sts that the patient is anxious to her back as he has been having almost daily episodes of elevated HRs. Crosby Oyster that I will call once Dr. Fletcher Anon has the opportunity to review the results and give his recommendation. Nevin Bloodgood verbalized understanding.

## 2019-11-20 NOTE — Telephone Encounter (Addendum)
Patients zio monitor has still not been resulted on the [employer] website and its been >24 hours sine the request to have the results expedited.  Contacted [employer] [phone] to check the status. Spoke with Verdis Frederickson in customer service. Verdis Frederickson sts that the expedite rqst was received on 11/19/19 and that it takes 1-2 days to finalize the report not <24 hours. The zio report is still processing.

## 2019-11-21 ENCOUNTER — Telehealth (INDEPENDENT_AMBULATORY_CARE_PROVIDER_SITE_OTHER): Payer: BC Managed Care – PPO | Admitting: Psychiatry

## 2019-11-21 ENCOUNTER — Other Ambulatory Visit: Payer: Self-pay

## 2019-11-21 ENCOUNTER — Encounter: Payer: Self-pay | Admitting: Psychiatry

## 2019-11-21 DIAGNOSIS — F411 Generalized anxiety disorder: Secondary | ICD-10-CM | POA: Diagnosis not present

## 2019-11-21 DIAGNOSIS — F4310 Post-traumatic stress disorder, unspecified: Secondary | ICD-10-CM

## 2019-11-21 DIAGNOSIS — F410 Panic disorder [episodic paroxysmal anxiety] without agoraphobia: Secondary | ICD-10-CM

## 2019-11-21 DIAGNOSIS — F5105 Insomnia due to other mental disorder: Secondary | ICD-10-CM

## 2019-11-21 MED ORDER — CLONAZEPAM 0.5 MG PO TABS: 0.5000 mg | ORAL_TABLET | Freq: Two times a day (BID) | ORAL | 1 refills | Status: DC | PRN

## 2019-11-21 NOTE — Telephone Encounter (Addendum)
Attempted to return the call to the patients girlfriend Sergio Garcia. Unable to lmtcb, her voicemail is not set up.   Spoke with the patient directly. Patient made aware of zio results and Dr. Tyrell Antonio recommendations.  Patient became upset when given the results. Patient sts that he does not agree with Dr. Tyrell Antonio assessment and thinks he needs another Cardiologist opinion. Patient feels that there is something wrong with his heart. He has had a recent cardiac cath and echo that were normal.  There was a lengthy conversation regarding the patients recent cardiac work up. He has an appt scheduled with [name], PA on 11/26/19. Advise him that he can keep that appt and go over all his recent testing in detail with [name]. If he decides to get a second opinion with another Cardiologist practice he can call our office back and cancel the appt.  Patient verbalized understanding.

## 2019-11-21 NOTE — Progress Notes (Signed)
Provider Location : [facility] Patient Location : Home  Virtual Visit via Video Note  I connected with Sergio Garcia on 11/21/19 at  4:00 PM EDT by a video enabled telemedicine application and verified that I am speaking with the correct person using two identifiers.   I discussed the limitations of evaluation and management by telemedicine and the availability of in person appointments. The patient expressed understanding and agreed to proceed.    I discussed the assessment and treatment plan with the patient. The patient was provided an opportunity to ask questions and all were answered. The patient agreed with the plan and demonstrated an understanding of the instructions.   The patient was advised to call back or seek an in-person evaluation if the symptoms worsen or if the condition fails to improve as anticipated.   Sergio Garcia Progress Note  11/21/2019 5:27 PM Sergio Garcia  MRN:  [id number]  Chief Complaint:  Chief Complaint    Follow-up     HPI: Sergio Garcia is a 44 year old male, employed, lives in [place], has a history of PTSD, GAD, insomnia, GERD, PVC, Gilbert's syndrome, elevated LFT was evaluated by telemedicine today.  Patient reports he has been struggling with racing heart rate, chest pain, shortness of breath since the past 1 month or so.  He reports the symptoms can happen randomly anytime of the day.  He reports it is usually not triggered by any stressors.  He reports he currently has no significant psychosocial stressors and does not believe he is worried about anything.  He reports he is monitoring his heart rate on his apple watch and he has noticed his heart rate to go as high as 120 today.  He reports today he could not walk to the mailbox since once he started walking he became short of breath and also started having chest pain.  He reports he is not worried about having these attacks and hence does not avoid any specific situations or stressors since he is worried  about getting another attack.  Patient reports he had cardiology evaluation and work-up done.  He had echocardiogram and cardiac catheterization done recently.  Patient reports he has upcoming appointment with his cardiologist.  However so far per cardiology he does not need any treatment and his symptoms and his cardiac work-up returned back as normal.  Patient reports he continues to take Prozac and Seroquel.  Patient reports sleep and appetite is fair.  Patient denies any suicidality, homicidality or perceptual disturbances.  Patient denies binging or misusing alcohol.  Patient is back with his girlfriend and reports the relationship is going well right now.    Visit Diagnosis:    ICD-10-CM   1. PTSD (post-traumatic stress disorder)  F43.10   2. Generalized anxiety disorder  F41.1 clonazePAM (KLONOPIN) 0.5 MG tablet  3. 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  Past trials of Wellbutrin, Zoloft, Effexor, Klonopin.  Past Medical History:  Past Medical History:  Diagnosis Date  . Anxiety   . Degenerative disc disease   . Depression   . Enteritis   . Gastritis   . GERD (gastroesophageal reflux disease)   . Gilbert's syndrome    hyperbilirubinemia  . IBS (irritable bowel syndrome)   . Inguinal hernia   . Insomnia   . Prostatitis 2008    Past Surgical  History:  Procedure Laterality Date  . BACK SURGERY     2 ruptured discs  . CHOLECYSTECTOMY  01/31/2014   GB polyp, chronic cholecystitis   . ESOPHAGOGASTRODUODENOSCOPY  09/2004   gastritis acute and duodenitis  . ESOPHAGOGASTRODUODENOSCOPY  01-14-14   Dr Allen Norris  . EXPLORATORY LAPAROTOMY  Jan 2015   Dr Burt Knack  . LASIK    . RIGHT/LEFT HEART CATH AND CORONARY ANGIOGRAPHY N/A 10/18/2019   Procedure: RIGHT/LEFT HEART CATH AND CORONARY ANGIOGRAPHY;  Surgeon: Wellington Hampshire, MD;  Location: [facility];  Service: Cardiovascular;  Laterality: N/A;    Family Psychiatric History: I have reviewed family psychiatric history from my progress note on 09/20/2018.  Family History:  Family History  Problem Relation Age of Onset  . Colon polyps Mother   . Cancer Mother        sinus cavity, on XRT  . Heart disease Father   . Heart attack Father        x 2  . Pancreatic cancer Maternal Grandfather   . Colon cancer Maternal Grandfather   . Diabetes Other        strong FH d both sides of family   . Heart Problems Paternal Grandfather   . Cancer Maternal Uncle   . Stroke Cousin        age 54/44  . Mental illness Neg Hx     Social History: I have reviewed social history from my progress note on 09/20/2018. Social History   Socioeconomic History  . Marital status: Legally Separated    Spouse name: Not on file  . Number of children: 2  . Years of education: Not on file  . Highest education level: High school graduate  Occupational History  . Occupation: Music therapist: [employer]  Tobacco Use  . Smoking status: Never Smoker  . Smokeless tobacco: Never Used  Vaping Use  . Vaping Use: Never used  Substance and Sexual Activity  . Alcohol use: Yes    Comment: occassional social  . Drug use: No  . Sexual activity: Yes  Other Topics Concern  . Not on file  Social History Narrative   Married.   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   Enjoys watching racing, Dealer, deer hunting.             Social Determinants of Health   Financial Resource Strain:   . Difficulty of Paying Living Expenses:   Food Insecurity:   . Worried About Charity fundraiser in the Last Year:   . Arboriculturist in the Last Year:   Transportation Needs:   . Film/video editor (Medical):   Marland Kitchen Lack of Transportation (Non-Medical):   Physical Activity:   . Days of Exercise per Week:   . Minutes of Exercise per Session:   Stress:   .  Feeling of Stress :   Social Connections:   . Frequency of Communication with Friends and Family:   . Frequency of Social Gatherings with Friends and Family:   . Attends Religious Services:   . Active Member of Clubs or Organizations:   . Attends Archivist Meetings:   Marland Kitchen Marital Status:     Allergies:  Allergies  Allergen Reactions  . Morphine And Related Anaphylaxis  . Morphine Nausea And Vomiting  . Tdap [Tetanus-Diphth-Acell Pertussis] Hives and Rash    Metabolic Disorder Labs: Lab Results  Component Value Date   HGBA1C 6.0 (H) 01/24/2019   No results found for: PROLACTIN Lab Results  Component Value Date   CHOL 173 01/24/2019   TRIG 125 01/24/2019   HDL 43 01/24/2019   CHOLHDL 4.0 01/24/2019   VLDL 16.2 03/02/2018   LDLCALC 108 (H) 01/24/2019   LDLCALC 120 (H) 03/02/2018   Lab Results  Component Value Date   TSH 1.400 01/24/2019   TSH 1.88 03/02/2018    Therapeutic Level Labs: No results found for: LITHIUM No results found for: VALPROATE No components found for:  CBMZ  Current Medications: Current Outpatient Medications  Medication Sig Dispense Refill  . cetirizine-pseudoephedrine (ZYRTEC-D) 5-120 MG tablet Take 1 tablet by mouth 2 (two) times daily. 30 tablet 0  . clonazePAM (KLONOPIN) 0.5 MG tablet Take 1 tablet (0.5 mg total) by mouth 2 (two) times daily as needed for anxiety. 60 tablet 1  . FLUoxetine (PROZAC) 40 MG capsule Take 1 capsule (40 mg total) by mouth daily. 90 capsule 0  . isosorbide mononitrate (IMDUR) 30 MG 24 hr tablet Take 1 tablet (30 mg total) by mouth daily. 90 tablet 3  . QUEtiapine (SEROQUEL) 25 MG tablet Take 1.5 tablets (37.5 mg total) by mouth at bedtime. 135 tablet 0  . SUMAtriptan (IMITREX) 50 MG tablet Take 1 tablet (50 mg total) by mouth as needed for migraine. May repeat in 2 hours if headache persists or recurs. No more than 2x per week. No more than 200 mg total/24 hours 9 tablet 2   No current  facility-administered medications for this visit.     Musculoskeletal: Strength & Muscle Tone: UTA Gait & Station: normal Patient leans: N/A  Psychiatric Specialty Exam: Review of Systems  Respiratory: Positive for chest tightness and shortness of breath.   Cardiovascular: Positive for chest pain and palpitations.  Psychiatric/Behavioral: The patient is nervous/anxious.   All other systems reviewed and are negative.   There were no vitals taken for this visit.There is no height or weight on file to calculate BMI.  General Appearance: Casual  Eye Contact:  Fair  Speech:  Clear and Coherent  Volume:  Normal  Mood:  Anxious  Affect:  Congruent  Thought Process:  Goal Directed and Descriptions of Associations: Intact  Orientation:  Full (Time, Place, and Person)  Thought Content: Logical   Suicidal Thoughts:  No  Homicidal Thoughts:  No  Memory:  Immediate;   Fair Recent;   Fair Remote;   Fair  Judgement:  Fair  Insight:  Fair  Psychomotor Activity:  Normal  Concentration:  Concentration: Fair and Attention Span: Fair  Recall:  [employer] of Knowledge: Fair  Language: Fair  Akathisia:  No  Handed:  Right  AIMS (if indicated): UTA  Assets:  Communication Skills Desire for Improvement Housing Social Support  ADL's:  Intact  Cognition: WNL  Sleep:  Fair   Screenings: GAD-7     Office Visit from 01/24/2018 in [facility] from 10/24/2016 in [facility] at [facility]  Total GAD-7 Score 14 14    PHQ2-9     Office Visit from 02/21/2019 in [facility] Visit from 01/24/2018 in [facility] Office Visit from 05/10/2016 in [facility] from 11/25/2015 in [facility] Visit from 10/30/2015 in [facility]  PHQ-2 Total Score 0 6 0 0 0  PHQ-9 Total Score -- 19 -- -- --  Assessment and Plan: DEMONDRE AGUAS is a 44 year old  male, lives in [place], employed, has a history of PTSD, IBS, PVC, Rosanna Randy syndrome was evaluated by telemedicine today.  Patient is biologically predisposed given his history of trauma.  Patient with history of relationship struggles, legal problems and recent death in the family.  Patient however was stable on the current dosage of Prozac until recently when he started having heart palpitation, chest pain and shortness of breath.  Patient had extensive cardiology work-up done which came back negative.  Discussed plan as noted below.  Plan PTSD-stable Prozac as prescribed   GAD-stable Continue Prozac 40 mg p.o. daily Seroquel 25 to 37.5 mg p.o. nightly   Panic attacks-unstable Patient had extensive cardiac work-up done.  I have reviewed notes per Dr. Midge Minium as well [name] PA -dated 11/21/2019-patient had 2 short runs of SVT which were not associated with symptoms.  It is highly likely that his symptoms though are not cardiac and might be related to anxiety given the negative cardiac work-up so far.' Will start Klonopin 0.5 mg p.o. twice daily. We will consider readjusting his Prozac dosage after his cardiology visit on 11/26/2019. Reestablish care with psychotherapist-will refer patient to our new therapist.  Insomnia-stable Seroquel as prescribed  Discussed with patient to sign a release so we can obtain medical records from his cardiologist after his next visit on 11/26/2019.  Advised patient to call the clinic for further management as needed.  Follow-up in clinic in 4 weeks or sooner if needed.  I have spent atleast 30 minutes non face to face with patient today. More than 50 % of the time was spent for preparing to see the patient ( e.g., review of test, records ), obtaining and to review and separately obtained history , ordering medications and test ,psychoeducation and supportive psychotherapy and care coordination,as well as documenting clinical information in electronic  health record.  This note was generated in part or whole with voice recognition software. Voice recognition is usually quite accurate but there are transcription errors that can and very often do occur. I apologize for any typographical errors that were not detected and corrected.          Ursula Alert, MD 11/21/2019, 5:27 PM

## 2019-11-21 NOTE — Patient Instructions (Signed)
Clonazepam tablets What is this medicine? CLONAZEPAM (kloe NA ze pam) is a benzodiazepine. It is used to treat certain types of seizures. It is also used to treat panic disorder. This medicine may be used for other purposes; ask your health care provider or pharmacist if you have questions. COMMON BRAND NAME(S): Ceberclon, Klonopin What should I tell my health care provider before I take this medicine? They need to know if you have any of these conditions:  an alcohol or drug abuse problem  bipolar disorder, depression, psychosis or other mental health condition  glaucoma  kidney or liver disease  lung or breathing disease  myasthenia gravis  Parkinson's disease  porphyria  seizures or a history of seizures  suicidal thoughts  an unusual or allergic reaction to clonazepam, other benzodiazepines, foods, dyes, or preservatives  pregnant or trying to get pregnant  breast-feeding How should I use this medicine? Take this medicine by mouth with a glass of water. Follow the directions on the prescription label. If it upsets your stomach, take it with food or milk. Take your medicine at regular intervals. Do not take it more often than directed. Do not stop taking or change the dose except on the advice of your doctor or health care professional. A special MedGuide will be given to you by the pharmacist with each prescription and refill. Be sure to read this information carefully each time. Talk to your pediatrician regarding the use of this medicine in children. Special care may be needed. Overdosage: If you think you have taken too much of this medicine contact a poison control center or emergency room at once. NOTE: This medicine is only for you. Do not share this medicine with others. What if I miss a dose? If you miss a dose, take it as soon as you can. If it is almost time for your next dose, take only that dose. Do not take double or extra doses. What may interact with this  medicine? Do not take this medication with any of the following medicines:  narcotic medicines for cough  sodium oxybate This medicine may also interact with the following medications:  alcohol  antihistamines for allergy, cough and cold  antiviral medicines for HIV or AIDS  certain medicines for anxiety or sleep  certain medicines for depression, like amitriptyline, fluoxetine, sertraline  certain medicines for fungal infections like ketoconazole and itraconazole  certain medicines for seizures like carbamazepine, phenobarbital, phenytoin, primidone  general anesthetics like halothane, isoflurane, methoxyflurane, propofol  local anesthetics like lidocaine, pramoxine, tetracaine  medicines that relax muscles for surgery  narcotic medicines for pain  phenothiazines like chlorpromazine, mesoridazine, prochlorperazine, thioridazine This list may not describe all possible interactions. Give your health care provider a list of all the medicines, herbs, non-prescription drugs, or dietary supplements you use. Also tell them if you smoke, drink alcohol, or use illegal drugs. Some items may interact with your medicine. What should I watch for while using this medicine? Tell your doctor or health care professional if your symptoms do not start to get better or if they get worse. Do not stop taking except on your doctor's advice. You may develop a severe reaction. Your doctor will tell you how much medicine to take. You may get drowsy or dizzy. Do not drive, use machinery, or do anything that needs mental alertness until you know how this medicine affects you. To reduce the risk of dizzy and fainting spells, do not stand or sit up quickly, especially if you are  an older patient. Alcohol may increase dizziness and drowsiness. Avoid alcoholic drinks. If you are taking another medicine that also causes drowsiness, you may have more side effects. Give your health care provider a list of all  medicines you use. Your doctor will tell you how much medicine to take. Do not take more medicine than directed. Call emergency for help if you have problems breathing or unusual sleepiness. The use of this medicine may increase the chance of suicidal thoughts or actions. Pay special attention to how you are responding while on this medicine. Any worsening of mood, or thoughts of suicide or dying should be reported to your health care professional right away. What side effects may I notice from receiving this medicine? Side effects that you should report to your doctor or health care professional as soon as possible:  allergic reactions like skin rash, itching or hives, swelling of the face, lips, or tongue  breathing problems  confusion  loss of balance or coordination  signs and symptoms of low blood pressure like dizziness; feeling faint or lightheaded, falls; unusually weak or tired  suicidal thoughts or mood changes Side effects that usually do not require medical attention (report to your doctor or health care professional if they continue or are bothersome):  dizziness  headache  tiredness  upset stomach This list may not describe all possible side effects. Call your doctor for medical advice about side effects. You may report side effects to FDA at 1-800-FDA-1088. Where should I keep my medicine? Keep out of the reach of children. This medicine can be abused. Keep your medicine in a safe place to protect it from theft. Do not share this medicine with anyone. Selling or giving away this medicine is dangerous and against the law. This medicine may cause accidental overdose and death if taken by other adults, children, or pets. Mix any unused medicine with a substance like cat litter or coffee grounds. Then throw the medicine away in a sealed container like a sealed bag or a coffee can with a lid. Do not use the medicine after the expiration date. Store at room temperature between  15 and 30 degrees C (59 and 86 degrees F). Protect from light. Keep container tightly closed. NOTE: This sheet is a summary. It may not cover all possible information. If you have questions about this medicine, talk to your doctor, pharmacist, or health care provider.  2020 Elsevier/Gold Standard (2015-10-16 18:46:32)  

## 2019-11-21 NOTE — Telephone Encounter (Signed)
I just reviewed his monitor and it only showed 2 short runs of SVT which were not associated with symptoms.  Reported symptoms while he was wearing the monitor did not correlate with arrhythmia.  There was no heart rate 160 on the monitor.  If he is having these episodes outside of the monitoring period, I suggest that he invests in alive core Jodelle Red) monitor or an apple watch with ECG tracing capacity.  That way he can check his rhythm anytime when he is having these episode can can forward to [place].  It is highly likely that his symptoms though are not cardiac and might be related to anxiety given the negative cardiac work-up so far.  I am forwarding this to his primary care physician to get her input.  Thanks.

## 2019-11-21 NOTE — Telephone Encounter (Signed)
Anxiety may not be controlled per cards  [place]

## 2019-11-21 NOTE — Telephone Encounter (Signed)
Patient girlfriend calling to check on status  Informed her results came in today and awaiting review  Please call when ready

## 2019-11-21 NOTE — Telephone Encounter (Signed)
Patients zio results are available for review in the patients chart. Message fwd to Dr. Fletcher Anon to make him aware.

## 2019-11-21 NOTE — Telephone Encounter (Signed)
Please see note from Dr. Fletcher Anon. Extensive cardiac testing has been unrevealing. Suspect his systems are not cardiac. Recommend he follow up with PCP.

## 2019-11-21 NOTE — Telephone Encounter (Signed)
Patient girlfriend is returning your call

## 2019-11-22 ENCOUNTER — Telehealth: Payer: Self-pay | Admitting: Cardiovascular Disease

## 2019-11-22 MED ORDER — METOPROLOL SUCCINATE ER 25 MG PO TB24
25.0000 mg | ORAL_TABLET | Freq: Every day | ORAL | 4 refills | Status: DC
Start: 2019-11-22 — End: 2019-12-25

## 2019-11-22 NOTE — Addendum Note (Signed)
Addended by: Annia Belt on: 11/22/2019 10:01 AM   Modules accepted: Orders

## 2019-11-22 NOTE — Telephone Encounter (Signed)
Patient verbalized understanding of Dr Tyrell Antonio recommendations and is agreeable to start metoprolol succinate 25 mg once a day. He will start the medication and plan to discuss further at appointment next week. Rx sent to pharmacy.

## 2019-11-22 NOTE — Telephone Encounter (Signed)
The monitor did show sinus tachycardia.  If he has significant palpitations: We can certainly try a small dose beta-blocker to improve with his symptoms.  If he is agreeable, we can start metoprolol succinate 25 mg once daily.  If his symptoms persist, one test that we have not done is a cardiopulmonary stress test and this can be considered upon follow-up.

## 2019-11-22 NOTE — Telephone Encounter (Signed)
Fine with me
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
## 2019-11-25 LAB — LAB REPORT - SCANNED: INR: 2.8

## 2019-11-25 NOTE — Telephone Encounter (Signed)
ok 

## 2019-11-26 ENCOUNTER — Encounter: Payer: Self-pay | Admitting: Cardiology

## 2019-11-26 ENCOUNTER — Other Ambulatory Visit: Payer: Self-pay

## 2019-11-26 ENCOUNTER — Encounter: Payer: Self-pay | Admitting: Physician Assistant

## 2019-11-26 ENCOUNTER — Ambulatory Visit (INDEPENDENT_AMBULATORY_CARE_PROVIDER_SITE_OTHER): Payer: BC Managed Care – PPO | Admitting: Physician Assistant

## 2019-11-26 VITALS — BP 124/92 | HR 61 | Ht 74.0 in | Wt 198.4 lb

## 2019-11-26 DIAGNOSIS — R079 Chest pain, unspecified: Secondary | ICD-10-CM

## 2019-11-26 DIAGNOSIS — I77810 Thoracic aortic ectasia: Secondary | ICD-10-CM

## 2019-11-26 DIAGNOSIS — R5383 Other fatigue: Secondary | ICD-10-CM | POA: Diagnosis not present

## 2019-11-26 DIAGNOSIS — R42 Dizziness and giddiness: Secondary | ICD-10-CM | POA: Diagnosis not present

## 2019-11-26 DIAGNOSIS — R002 Palpitations: Secondary | ICD-10-CM

## 2019-11-26 DIAGNOSIS — R0602 Shortness of breath: Secondary | ICD-10-CM

## 2019-11-26 DIAGNOSIS — I471 Supraventricular tachycardia: Secondary | ICD-10-CM

## 2019-11-26 NOTE — Patient Instructions (Signed)
Medication Instructions:  Your physician recommends that you continue on your current medications as directed. Please refer to the Current Medication list given to you today.  *If you need a refill on your cardiac medications before your next appointment, please call your pharmacy*   Lab Work: None ordered If you have labs (blood work) drawn today and your tests are completely normal, you will receive your results only by:  [place] (if you have MyChart) OR  A paper copy in the mail If you have any lab test that is abnormal or we need to change your treatment, we will call you to review the results.   Testing/Procedures: Your physician has recommended that you have a cardiopulmonary stress test (CPX). CPX testing is a non-invasive measurement of heart and lung function. It replaces a traditional treadmill stress test. This type of test provides a tremendous amount of information that relates not only to your present condition but also for future outcomes. This test combines measurements of you ventilation, respiratory gas exchange in the lungs, electrocardiogram (EKG), blood pressure and physical response before, during, and following an exercise protocol. (To be performed at [facility])    Follow-Up: At [facility], you and your health needs are our priority.  As part of our continuing mission to provide you with exceptional heart care, we have created designated Provider Care Teams.  These Care Teams include your primary Cardiologist (physician) and Advanced Practice Providers (APPs -  Physician Assistants and Nurse Practitioners) who all work together to provide you with the care you need, when you need it.  We recommend signing up for the patient portal called "MyChart".  Sign up information is provided on this After Visit Summary.  MyChart is used to connect with patients for Virtual Visits (Telemedicine).  Patients are able to view lab/test results, encounter notes,  upcoming appointments, etc.  Non-urgent messages can be sent to your provider as well.   To learn more about what you can do with MyChart, go to [email or web address].    Your next appointment:   6-8 week(s)  The format for your next appointment:   In Person  Provider:    You may see Kathlyn Sacramento, MD or one of the following Advanced Practice Providers on your designated Care Team:    Murray Hodgkins, NP  [name], PA-C  Marrianne Mood, PA-C    Other Instructions A message has been sent to Landis Martins to contact you to schedule the CPX. If you have not heard from her in a week you can call her at [phone].

## 2019-11-27 ENCOUNTER — Ambulatory Visit (INDEPENDENT_AMBULATORY_CARE_PROVIDER_SITE_OTHER): Payer: BC Managed Care – PPO | Admitting: Licensed Clinical Social Worker

## 2019-11-27 ENCOUNTER — Encounter: Payer: Self-pay | Admitting: Licensed Clinical Social Worker

## 2019-11-27 DIAGNOSIS — F329 Major depressive disorder, single episode, unspecified: Secondary | ICD-10-CM | POA: Diagnosis not present

## 2019-11-27 DIAGNOSIS — F32A Depression, unspecified: Secondary | ICD-10-CM

## 2019-11-27 DIAGNOSIS — F4310 Post-traumatic stress disorder, unspecified: Secondary | ICD-10-CM | POA: Diagnosis not present

## 2019-11-27 DIAGNOSIS — F419 Anxiety disorder, unspecified: Secondary | ICD-10-CM
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
## 2019-12-02 NOTE — Telephone Encounter (Signed)
Left message for patient that the switch was approved. Instructed him to keep appointment with [name] as scheduled and follow-up with Dr. Burt Knack will be arranged appropriately at that visit. Instructed him to call with questions or concerns.

## 2019-12-03 ENCOUNTER — Ambulatory Visit (INDEPENDENT_AMBULATORY_CARE_PROVIDER_SITE_OTHER): Payer: BC Managed Care – PPO | Admitting: Podiatry

## 2019-12-03 ENCOUNTER — Encounter: Payer: Self-pay | Admitting: Podiatry

## 2019-12-03 ENCOUNTER — Other Ambulatory Visit: Payer: Self-pay

## 2019-12-03 DIAGNOSIS — L600 Ingrowing nail: Secondary | ICD-10-CM | POA: Diagnosis not present

## 2019-12-03 MED ORDER — TERBINAFINE HCL 250 MG PO TABS: 250.0000 mg | ORAL_TABLET | Freq: Every day | ORAL | 0 refills | Status: DC

## 2019-12-03 MED ORDER — GENTAMICIN SULFATE 0.1 % EX CREA
1.0000 | TOPICAL_CREAM | Freq: Two times a day (BID) | CUTANEOUS | 1 refills | Status: DC
Start: 2019-12-03 — End: 2020-06-17

## 2019-12-03 NOTE — Patient Instructions (Signed)

## 2019-12-03 NOTE — Progress Notes (Addendum)
   Subjective: Patient presents today for evaluation of pain to the medial aspect of the right hallux. Patient is concerned for possible ingrown nail.  Patient states that several years ago he had an ingrown toenail procedure performed in the same area but the nail grew back and he consistently gets ingrown's and has to trim the piece of nail out that grows back.  Patient also complains of thickening and discoloration to the hallux nail plate concerning for toenail infection.  Patient states that he has had toenail fungus in the past and has been cleared up with Lamisil oral medication.  Patient presents today for further treatment and evaluation.  Past Medical History:  Diagnosis Date  . Anxiety   . Degenerative disc disease   . Depression   . Enteritis   . Gastritis   . GERD (gastroesophageal reflux disease)   . Gilbert's syndrome    hyperbilirubinemia  . IBS (irritable bowel syndrome)   . Inguinal hernia   . Insomnia   . Prostatitis 2008    Objective:  General: Well developed, nourished, in no acute distress, alert and oriented x3   Dermatology: Skin is warm, dry and supple bilateral.  Medial border right hallux appears to be erythematous with evidence of an ingrowing nail and recurrent nail spicule from prior ingrown procedure. Pain on palpation noted to the border of the nail fold. The remaining nails appear unremarkable at this time. There are no open sores, lesions. Hyperkeratotic discolored nail noted to the left hallux nail plate  Vascular: Dorsalis Pedis artery and Posterior Tibial artery pedal pulses palpable. No lower extremity edema noted.   Neruologic: Grossly intact via light touch bilateral.  Musculoskeletal: Muscular strength within normal limits in all groups bilateral. Normal range of motion noted to all pedal and ankle joints.   Assesement: #1 Paronychia with ingrowing nail medial border right hallux #2 Pain in toe #3 Incurvated nail #4 onychomycosis of toenail  left hallux nail plate  Plan of Care:  1. Patient evaluated.  2. Discussed treatment alternatives and plan of care. Explained nail avulsion procedure and post procedure course to patient. 3. Patient opted for permanent partial nail avulsion of the medial border right hallux.  4. Prior to procedure, local anesthesia infiltration utilized using 3 ml of a 50:50 mixture of 2% plain lidocaine and 0.5% plain marcaine in a normal hallux block fashion and a betadine prep performed.  5. Partial permanent nail avulsion with chemical matrixectomy performed using [id number] applications of phenol followed by alcohol flush.  6. Light dressing applied. 7.  Prescription for gentamicin cream  8.  Prescription for Lamisil 250 mg #90 daily.  Patient otherwise healthy and denies any hepatic past medical history 9.  Return to clinic as needed  Edrick Kins, DPM [facility]  Dr. Edrick Kins, [address]                                        [place], [place] [address]                Office [phone]  Fax [phone]

## 2019-12-03 NOTE — Addendum Note (Signed)
Addended by: Edrick Kins on: 12/03/2019 11:40 AM   Modules accepted: Orders

## 2019-12-06 ENCOUNTER — Ambulatory Visit: Payer: BC Managed Care – PPO | Admitting: Physician Assistant

## 2019-12-09 ENCOUNTER — Encounter: Payer: Self-pay | Admitting: Internal Medicine

## 2019-12-10 ENCOUNTER — Other Ambulatory Visit: Payer: Self-pay

## 2019-12-10 ENCOUNTER — Encounter: Payer: Self-pay | Admitting: Licensed Clinical Social Worker

## 2019-12-10 ENCOUNTER — Ambulatory Visit (INDEPENDENT_AMBULATORY_CARE_PROVIDER_SITE_OTHER): Payer: BC Managed Care – PPO | Admitting: Licensed Clinical Social Worker

## 2019-12-10 DIAGNOSIS — F4310 Post-traumatic stress disorder, unspecified: Secondary | ICD-10-CM | POA: Diagnosis not present

## 2019-12-10 DIAGNOSIS — F419 Anxiety disorder, unspecified: Secondary | ICD-10-CM | POA: Diagnosis not present

## 2019-12-10 DIAGNOSIS — F32A Depression, unspecified: Secondary | ICD-10-CM

## 2019-12-10 DIAGNOSIS — F329 Major depressive disorder, single episode, unspecified: Secondary | ICD-10-CM

## 2019-12-10 NOTE — Telephone Encounter (Signed)
Please advise, Patient last seen 03/2019 and last had labs done over 6 months ago.   Does Patient need an appointment?

## 2019-12-10 NOTE — Progress Notes (Signed)
Patient Location: Home  Provider Location: Home Office   Virtual Visit via Video Note  I connected with Sergio Garcia on 12/10/19 at 10:00 AM EDT by a video enabled telemedicine application and verified that I am speaking with the correct person using two identifiers.   I discussed the limitations of evaluation and management by telemedicine and the availability of in person appointments. The patient expressed understanding and agreed to proceed.  THERAPY PROGRESS NOTE  Session Time: 60 Minutes  Participation Level: Active  Behavioral Response: Well GroomedAlertDepressed  Type of Therapy: Individual Therapy  Treatment Goals addressed: Coping  Interventions: CBT  Summary: Sergio Garcia is a 43 y.o. male who presents with depression sxs. Pt reported this past week has been "rough" and identified triggers for depression sxs. Pt identified what has helped him cope in the past and what he needs from his support systems. Pt was receptive to feedback and interventions.  Suicidal/Homicidal: No  Therapist Response: Therapist met with patient for first session since CCA. Therapist and patient reviewed treatment plan and goals. Pt in agreement. Therapist provided psychoeducation around re-experiencing and triggers. Therapist and patient discussed current stressors and observations of patient's emotions and thoughts. Therapist encouraged patient to communicate needs assertively with existing supports.   Plan: Return again in 1 week.  Diagnosis: Axis I: Post Traumatic Stress Disorder    Axis II: N/A     P O'Reilly, LCSW, LCAS 12/10/2019  

## 2019-12-16 ENCOUNTER — Telehealth: Payer: Self-pay | Admitting: Internal Medicine

## 2019-12-16 ENCOUNTER — Other Ambulatory Visit: Payer: Self-pay

## 2019-12-16 NOTE — Telephone Encounter (Signed)
Yes schedule pt an appt where he is fasting in the am  (this week)  At appt time I would like before 10 am preferably 8 or 8:30 will check : Liver enzymes  Lipid  A1C  TSH Urine  Vitamin D Cortisol  Testosterone    Advise patient cardiology work up for racing heart was normal/no cardiac reason  He may also want to reach out to psychiatry as anxiety can cause racing heart    [place]

## 2019-12-16 NOTE — Telephone Encounter (Signed)
Patient informed and verbalized understanding.  Scheduled for follow up with fasting labs 7/29 Thursday at 8:30 am.

## 2019-12-17 ENCOUNTER — Encounter: Payer: Self-pay | Admitting: Psychiatry

## 2019-12-17 ENCOUNTER — Telehealth (INDEPENDENT_AMBULATORY_CARE_PROVIDER_SITE_OTHER): Payer: BC Managed Care – PPO | Admitting: Psychiatry

## 2019-12-17 ENCOUNTER — Ambulatory Visit (HOSPITAL_COMMUNITY): Payer: BC Managed Care – PPO | Attending: Cardiology

## 2019-12-17 DIAGNOSIS — F4310 Post-traumatic stress disorder, unspecified: Secondary | ICD-10-CM

## 2019-12-17 DIAGNOSIS — R0602 Shortness of breath: Secondary | ICD-10-CM | POA: Insufficient documentation

## 2019-12-17 DIAGNOSIS — R0600 Dyspnea, unspecified: Secondary | ICD-10-CM

## 2019-12-17 DIAGNOSIS — R079 Chest pain, unspecified: Secondary | ICD-10-CM

## 2019-12-17 DIAGNOSIS — F410 Panic disorder [episodic paroxysmal anxiety] without agoraphobia: Secondary | ICD-10-CM

## 2019-12-17 DIAGNOSIS — F5105 Insomnia due to other mental disorder: Secondary | ICD-10-CM | POA: Diagnosis not present

## 2019-12-17 DIAGNOSIS — F411 Generalized anxiety disorder: Secondary | ICD-10-CM

## 2019-12-17 MED ORDER — FLUOXETINE HCL 20 MG PO CAPS: 20.0000 mg | ORAL_CAPSULE | Freq: Every day | ORAL | 0 refills | Status: DC

## 2019-12-17 MED ORDER — FLUOXETINE HCL 40 MG PO CAPS: 40.0000 mg | ORAL_CAPSULE | Freq: Every day | ORAL | 0 refills | Status: DC

## 2019-12-17 NOTE — Progress Notes (Signed)
Provider Location : [facility] Patient Location : Home  Virtual Visit via Video Note  I connected with Sergio Garcia on 12/17/19 at  3:40 PM EDT by a video enabled telemedicine application and verified that I am speaking with the correct person using two identifiers.   I discussed the limitations of evaluation and management by telemedicine and the availability of in person appointments. The patient expressed understanding and agreed to proceed.    I discussed the assessment and treatment plan with the patient. The patient was provided an opportunity to ask questions and all were answered. The patient agreed with the plan and demonstrated an understanding of the instructions.   The patient was advised to call back or seek an in-person evaluation if the symptoms worsen or if the condition fails to improve as anticipated.   [facility] MD OP Progress Note  12/17/2019 9:33 PM Sergio Garcia  MRN:  [id number]  Chief Complaint:  Chief Complaint    Follow-up     HPI: Sergio Garcia is a 44 year old male, employed, lives in [place], has a history of PTSD, GAD, insomnia, GERD, PVC, Gilbert's syndrome, elevated LFT was evaluated by telemedicine today.  Patient today reports he continues to have racing heart rate, shortness of breath and chest pain on a regular basis.  He is currently undergoing testing per cardiology.  He reports he had stress test done today and is awaiting a report.  He reports he was told he may have slight abnormality in his testing today.  Patient reports he is currently compliant with his medications.  He may have taken Klonopin couple of times since it was prescribed.  He reports it does help.  He is trying to limit use.  He is compliant on his other medications as prescribed including Prozac and Seroquel.  He denies side effects.  Patient reports he is currently following up with his therapist on a weekly basis and that does help.  Patient denies any suicidality, homicidality  or perceptual disturbances.  Patient reports sleep is good.  Patient does report that he is able to function well at work.  Patient does report psychosocial stressors of his girlfriend's problems with her children's custodybattle and so on which does affect him also.  He reports he is trying to work on his coping skills to cope with his anxiety related to the same.  Patient denies any other concerns today.  Visit Diagnosis:    ICD-10-CM   1. PTSD (post-traumatic stress disorder)  F43.10 FLUoxetine (PROZAC) 40 MG capsule    FLUoxetine (PROZAC) 20 MG capsule  2. Generalized anxiety disorder  F41.1 FLUoxetine (PROZAC) 40 MG capsule    FLUoxetine (PROZAC) 20 MG capsule  3. 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  Past trials of Wellbutrin, Zoloft, Effexor, Klonopin  Past Medical History:  Past Medical History:  Diagnosis Date  . Anxiety   . Degenerative disc disease   . Depression   . Enteritis   . Gastritis   . GERD (gastroesophageal reflux disease)   . Gilbert's syndrome    hyperbilirubinemia  . IBS (irritable bowel syndrome)   . Inguinal hernia   . Insomnia   . Prostatitis 2008    Past Surgical History:  Procedure Laterality Date  . BACK SURGERY     2 ruptured discs  . CHOLECYSTECTOMY  01/31/2014  GB polyp, chronic cholecystitis   . ESOPHAGOGASTRODUODENOSCOPY  09/2004   gastritis acute and duodenitis  . ESOPHAGOGASTRODUODENOSCOPY  01-14-14   Dr Allen Norris  . EXPLORATORY LAPAROTOMY  Jan 2015   Dr Burt Knack  . LASIK    . RIGHT/LEFT HEART CATH AND CORONARY ANGIOGRAPHY N/A 10/18/2019   Procedure: RIGHT/LEFT HEART CATH AND CORONARY ANGIOGRAPHY;  Surgeon: Wellington Hampshire, MD;  Location: [facility];  Service: Cardiovascular;  Laterality: N/A;    Family Psychiatric History: I have reviewed family psychiatric history from my progress note  on 09/20/2018  Family History:  Family History  Problem Relation Age of Onset  . Colon polyps Mother   . Cancer Mother        sinus cavity, on XRT  . Heart disease Father   . Heart attack Father        x 2  . Pancreatic cancer Maternal Grandfather   . Colon cancer Maternal Grandfather   . Diabetes Other        strong FH d both sides of family   . Heart Problems Paternal Grandfather   . Cancer Maternal Uncle   . Stroke Cousin        age 2/44  . Mental illness Neg Hx     Social History: I have reviewed social history from my progress note on 09/20/2018 Social History   Socioeconomic History  . Marital status: Legally Separated    Spouse name: Not on file  . Number of children: 2  . Years of education: Not on file  . Highest education level: High school graduate  Occupational History  . Occupation: Music therapist: [employer]  Tobacco Use  . Smoking status: Never Smoker  . Smokeless tobacco: Never Used  Vaping Use  . Vaping Use: Never used  Substance and Sexual Activity  . Alcohol use: Yes    Comment: occassional social  . Drug use: No  . Sexual activity: Yes  Other Topics Concern  . Not on file  Social History Narrative   Married.   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   Enjoys watching racing, Dealer, deer hunting.             Social Determinants of Health   Financial Resource Strain:   . Difficulty of Paying Living Expenses:   Food Insecurity:   . Worried About Charity fundraiser in the Last Year:   . Arboriculturist in the Last Year:   Transportation Needs:   . Film/video editor (Medical):   [place] Kitchen Lack of Transportation (Non-Medical):   Physical Activity:   . Days of Exercise per Week:   . Minutes of Exercise per Session:   Stress:   . Feeling of Stress :   Social Connections:   . Frequency of Communication with Friends and Family:   . Frequency of Social Gatherings with Friends and Family:   .  Attends Religious Services:   . Active Member of Clubs or Organizations:   . Attends Archivist Meetings:   [place] Kitchen Marital Status:     Allergies:  Allergies  Allergen Reactions  . Morphine And Related Anaphylaxis  . Morphine Nausea And Vomiting  . Tdap [Tetanus-Diphth-Acell Pertussis] Hives and Rash    Metabolic Disorder Labs: Lab Results  Component Value Date   HGBA1C 6.0 (H) 01/24/2019   No results found for: PROLACTIN Lab Results  Component Value Date  CHOL 173 01/24/2019   TRIG 125 01/24/2019   HDL 43 01/24/2019   CHOLHDL 4.0 01/24/2019   VLDL 16.2 03/02/2018   LDLCALC 108 (H) 01/24/2019   LDLCALC 120 (H) 03/02/2018   Lab Results  Component Value Date   TSH 1.400 01/24/2019   TSH 1.88 03/02/2018    Therapeutic Level Labs: No results found for: LITHIUM No results found for: VALPROATE No components found for:  CBMZ  Current Medications: Current Outpatient Medications  Medication Sig Dispense Refill  . cetirizine-pseudoephedrine (ZYRTEC-D) 5-120 MG tablet Take 1 tablet by mouth 2 (two) times daily. 30 tablet 0  . clonazePAM (KLONOPIN) 0.5 MG tablet Take 1 tablet (0.5 mg total) by mouth 2 (two) times daily as needed for anxiety. 60 tablet 1  . FLUoxetine (PROZAC) 20 MG capsule Take 1 capsule (20 mg total) by mouth daily. To be combined with 40 mg 90 capsule 0  . FLUoxetine (PROZAC) 40 MG capsule Take 1 capsule (40 mg total) by mouth daily. To be combined with 20 mg 90 capsule 0  . gentamicin cream (GARAMYCIN) 0.1 % Apply 1 application topically 2 (two) times daily. 15 g 1  . isosorbide mononitrate (IMDUR) 30 MG 24 hr tablet Take 1 tablet (30 mg total) by mouth daily. 90 tablet 3  . metoprolol succinate (TOPROL XL) 25 MG 24 hr tablet Take 1 tablet (25 mg total) by mouth daily. 30 tablet 4  . ondansetron (ZOFRAN) 4 MG tablet Take by mouth.    . QUEtiapine (SEROQUEL) 25 MG tablet Take 1.5 tablets (37.5 mg total) by mouth at bedtime. 135 tablet 0  . SUMAtriptan  (IMITREX) 50 MG tablet Take 1 tablet (50 mg total) by mouth as needed for migraine. May repeat in 2 hours if headache persists or recurs. No more than 2x per week. No more than 200 mg total/24 hours 9 tablet 2  . terbinafine (LAMISIL) 250 MG tablet Take 1 tablet (250 mg total) by mouth daily. 90 tablet 0   No current facility-administered medications for this visit.     Musculoskeletal: Strength & Muscle Tone: UTA Gait & Station: normal Patient leans: N/A  Psychiatric Specialty Exam: Review of Systems  Constitutional: Positive for fatigue.  Respiratory: Positive for chest tightness and shortness of breath.   Psychiatric/Behavioral: The patient is nervous/anxious.   All other systems reviewed and are negative.   There were no vitals taken for this visit.There is no height or weight on file to calculate BMI.  General Appearance: Casual  Eye Contact:  Fair  Speech:  Clear and Coherent  Volume:  Normal  Mood:  Anxious  Affect:  Congruent  Thought Process:  Goal Directed and Descriptions of Associations: Intact  Orientation:  Full (Time, Place, and Person)  Thought Content: Logical   Suicidal Thoughts:  No  Homicidal Thoughts:  No  Memory:  Immediate;   Fair Recent;   Fair Remote;   Fair  Judgement:  Fair  Insight:  Fair  Psychomotor Activity:  Normal  Concentration:  Concentration: Fair and Attention Span: Fair  Recall:  [employer] of Knowledge: Fair  Language: Fair  Akathisia:  No  Handed:  Right  AIMS (if indicated): UTA  Assets:  Communication Skills Desire for Improvement Housing Intimacy Social Support Talents/Skills Transportation Vocational/Educational  ADL's:  Intact  Cognition: WNL  Sleep:  Fair   Screenings: GAD-7     Office Visit from 01/24/2018 in [facility] Visit from 10/24/2016 in [facility] [place]  [facility]  Total GAD-7 Score 14 14    PHQ2-9     Office Visit from 02/21/2019 in [facility]  Visit from 01/24/2018 in [facility] Office Visit from 05/10/2016 in [facility] from 11/25/2015 in [facility] Visit from 10/30/2015 in [facility]  PHQ-2 Total Score 0 6 0 0 0  PHQ-9 Total Score -- 19 -- -- --       Assessment and Plan: Sergio Garcia is a 44 year old male, lives in [place], employed, has a history of PTSD, IBS, PVC, Gilbert's syndrome was evaluated by telemedicine today.  Patient is biologically predisposed given his history of trauma.  Patient with psychosocial stressors of relationship struggles as well as his own medical problems.  Patient continues to have shortness of breath chest pain , unknown if due to underlying medical problems versus anxiety.  We will continue to readjust medications and continue intensive psychotherapy sessions.  Plan as noted below.  Plan PTSD-stable Prozac as prescribed Continue CBT  Generalized anxiety disorder-unstable Increase Prozac to 60 mg p.o. daily Seroquel 25 to 37.5 mg p.o. nightly Continue Klonopin 0.5 mg twice a day as needed for severe anxiety symptoms Continue CBT on a weekly basis  Panic attacks-unstable Increase Prozac to 60 mg p.o. daily Klonopin 0.5 mg twice a day as needed Patient to sign a release to obtain medical records from his cardiologist.  He had stress testing done today and is awaiting report.  Insomnia-stable Seroquel as prescribed  Follow-up in clinic in 3 weeks or sooner if needed.  I have spent atleast 20 minutes non face to face with patient today. More than 50 % of the time was spent for preparing to see the patient ( e.g., review of test, records ), obtaining and to review and separately obtained history , ordering medications and test ,psychoeducation and supportive psychotherapy and care coordination,as well as documenting clinical information in electronic health record. This note was generated in part or whole  with voice recognition software. Voice recognition is usually quite accurate but there are transcription errors that can and very often do occur. I apologize for any typographical errors that were not detected and corrected.        Ursula Alert, MD 12/17/2019, 9:33 PM

## 2019-12-19 ENCOUNTER — Encounter: Payer: Self-pay | Admitting: Internal Medicine

## 2019-12-19 ENCOUNTER — Other Ambulatory Visit: Payer: Self-pay

## 2019-12-19 ENCOUNTER — Ambulatory Visit: Payer: BC Managed Care – PPO | Admitting: Licensed Clinical Social Worker

## 2019-12-19 ENCOUNTER — Ambulatory Visit (INDEPENDENT_AMBULATORY_CARE_PROVIDER_SITE_OTHER): Payer: BC Managed Care – PPO | Admitting: Internal Medicine

## 2019-12-19 VITALS — BP 124/84 | HR 60 | Temp 98.2°F | Ht 74.0 in | Wt 202.0 lb

## 2019-12-19 DIAGNOSIS — Z1159 Encounter for screening for other viral diseases: Secondary | ICD-10-CM | POA: Diagnosis not present

## 2019-12-19 DIAGNOSIS — Z0184 Encounter for antibody response examination: Secondary | ICD-10-CM | POA: Diagnosis not present

## 2019-12-19 DIAGNOSIS — E785 Hyperlipidemia, unspecified: Secondary | ICD-10-CM

## 2019-12-19 DIAGNOSIS — K219 Gastro-esophageal reflux disease without esophagitis: Secondary | ICD-10-CM

## 2019-12-19 DIAGNOSIS — J45990 Exercise induced bronchospasm: Secondary | ICD-10-CM | POA: Diagnosis not present

## 2019-12-19 DIAGNOSIS — R7303 Prediabetes: Secondary | ICD-10-CM | POA: Diagnosis not present

## 2019-12-19 DIAGNOSIS — K4090 Unilateral inguinal hernia, without obstruction or gangrene, not specified as recurrent: Secondary | ICD-10-CM

## 2019-12-19 LAB — HEPATIC FUNCTION PANEL
ALT: 14 U/L (ref 0–53)
AST: 12 U/L (ref 0–37)
Albumin: 4.2 g/dL (ref 3.5–5.2)
Alkaline Phosphatase: 45 U/L (ref 39–117)
Bilirubin, Direct: 0.2 mg/dL (ref 0.0–0.3)
Total Bilirubin: 1.9 mg/dL — ABNORMAL HIGH (ref 0.2–1.2)
Total Protein: 6.7 g/dL (ref 6.0–8.3)

## 2019-12-19 LAB — LIPID PANEL
Cholesterol: 183 mg/dL (ref 0–200)
HDL: 41.4 mg/dL (ref >39.00)
LDL Cholesterol: 124 mg/dL — ABNORMAL HIGH (ref 0–99)
NonHDL: 141.64
Total CHOL/HDL Ratio: 4
Triglycerides: 90 mg/dL (ref 0.0–149.0)
VLDL: 18 mg/dL (ref 0.0–40.0)

## 2019-12-19 LAB — HEMOGLOBIN A1C: Hgb A1c MFr Bld: 6 % (ref 4.6–6.5)

## 2019-12-19 MED ORDER — ALBUTEROL SULFATE HFA 108 (90 BASE) MCG/ACT IN AERS: 1.0000 | INHALATION_SPRAY | Freq: Four times a day (QID) | RESPIRATORY_TRACT | 11 refills | Status: DC | PRN

## 2019-12-19 MED ORDER — PANTOPRAZOLE SODIUM 40 MG PO TBEC: 40.0000 mg | DELAYED_RELEASE_TABLET | Freq: Every day | ORAL | 3 refills | Status: DC

## 2019-12-19 NOTE — Progress Notes (Signed)
Chief Complaint  Patient presents with  . Follow-up   F/u  1. C/o prediabetes A1C 6.0 FH strong Dm on dads sided  2. HLD repeat again today since fasting  3. Right inguinal hernia noted DOT physical needs form signed for work he does not have sx's 4. GERD needs refill of protonix 40 5. Anxiety improved after med changes prn klonopin 0.5 per psych also on prozac 60 mg qd and seroquel 25 GAD 7 score 16 today  6. O2 sat 83% with exercise test 12/17/19 and has sob with exertion ?exercise asthma   Review of Systems  Constitutional: Negative for weight loss.  HENT: Negative for congestion.   Eyes: Negative for blurred vision.  Respiratory: Positive for shortness of breath.   Cardiovascular: Negative for palpitations.  Gastrointestinal: Negative for abdominal pain.  Musculoskeletal: Negative for falls.  Skin: Negative for rash.  Neurological: Negative for headaches.  Psychiatric/Behavioral: Negative for depression.   Past Medical History:  Diagnosis Date  . Anxiety   . Degenerative disc disease   . Depression   . Enteritis   . Gastritis   . GERD (gastroesophageal reflux disease)   . Gilbert's syndrome    hyperbilirubinemia  . IBS (irritable bowel syndrome)   . Inguinal hernia   . Insomnia   . Prostatitis 2008   Past Surgical History:  Procedure Laterality Date  . BACK SURGERY     2 ruptured discs  . CHOLECYSTECTOMY  01/31/2014   GB polyp, chronic cholecystitis   . ESOPHAGOGASTRODUODENOSCOPY  09/2004   gastritis acute and duodenitis  . ESOPHAGOGASTRODUODENOSCOPY  01-14-14   Dr Allen Norris  . EXPLORATORY LAPAROTOMY  Jan 2015   Dr Burt Knack  . LASIK    . RIGHT/LEFT HEART CATH AND CORONARY ANGIOGRAPHY N/A 10/18/2019   Procedure: RIGHT/LEFT HEART CATH AND CORONARY ANGIOGRAPHY;  Surgeon: Wellington Hampshire, MD;  Location: [facility];  Service: Cardiovascular;  Laterality: N/A;   Family History  Problem Relation Age of Onset  . Colon polyps Mother   . Cancer Mother         sinus cavity, on XRT  . Heart disease Father   . Heart attack Father        x 2  . Pancreatic cancer Maternal Grandfather   . Colon cancer Maternal Grandfather   . Diabetes Other        strong FH d both sides of family   . Heart Problems Paternal Grandfather   . Cancer Maternal Uncle   . Stroke Cousin        age 33/44  . Mental illness Neg Hx    Social History   Socioeconomic History  . Marital status: Divorced    Spouse name: Not on file  . Number of children: 2  . Years of education: Not on file  . Highest education level: High school graduate  Occupational History  . Occupation: Music therapist: [employer]  Tobacco Use  . Smoking status: Never Smoker  . Smokeless tobacco: Never Used  Vaping Use  . Vaping Use: Never used  Substance and Sexual Activity  . Alcohol use: Yes    Comment: occassional social  . Drug use: No  . Sexual activity: Yes  Other Topics Concern  . Not on file  Social History Narrative   Married.   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   Enjoys watching racing, Dealer, deer hunting.  Social Determinants of Health   Financial Resource Strain:   . Difficulty of Paying Living Expenses:   Food Insecurity:   . Worried About Charity fundraiser in the Last Year:   . Arboriculturist in the Last Year:   Transportation Needs:   . Film/video editor (Medical):   Marland Kitchen Lack of Transportation (Non-Medical):   Physical Activity:   . Days of Exercise per Week:   . Minutes of Exercise per Session:   Stress:   . Feeling of Stress :   Social Connections:   . Frequency of Communication with Friends and Family:   . Frequency of Social Gatherings with Friends and Family:   . Attends Religious Services:   . Active Member of Clubs or Organizations:   . Attends Archivist Meetings:   Marland Kitchen Marital Status:   Intimate Partner Violence:   . Fear of Current or Ex-Partner:   . Emotionally  Abused:   Marland Kitchen Physically Abused:   . Sexually Abused:    Current Meds  Medication Sig  . cetirizine-pseudoephedrine (ZYRTEC-D) 5-120 MG tablet Take 1 tablet by mouth 2 (two) times daily.  . clonazePAM (KLONOPIN) 0.5 MG tablet Take 1 tablet (0.5 mg total) by mouth 2 (two) times daily as needed for anxiety.  Marland Kitchen FLUoxetine (PROZAC) 20 MG capsule Take 1 capsule (20 mg total) by mouth daily. To be combined with 40 mg  . FLUoxetine (PROZAC) 40 MG capsule Take 1 capsule (40 mg total) by mouth daily. To be combined with 20 mg  . gentamicin cream (GARAMYCIN) 0.1 % Apply 1 application topically 2 (two) times daily.  . isosorbide mononitrate (IMDUR) 30 MG 24 hr tablet Take 1 tablet (30 mg total) by mouth daily.  . metoprolol succinate (TOPROL XL) 25 MG 24 hr tablet Take 1 tablet (25 mg total) by mouth daily.  . ondansetron (ZOFRAN) 4 MG tablet Take by mouth.  . QUEtiapine (SEROQUEL) 25 MG tablet Take 1.5 tablets (37.5 mg total) by mouth at bedtime.  . SUMAtriptan (IMITREX) 50 MG tablet Take 1 tablet (50 mg total) by mouth as needed for migraine. May repeat in 2 hours if headache persists or recurs. No more than 2x per week. No more than 200 mg total/24 hours  . terbinafine (LAMISIL) 250 MG tablet Take 1 tablet (250 mg total) by mouth daily.   Allergies  Allergen Reactions  . Morphine And Related Anaphylaxis  . Morphine Nausea And Vomiting  . Tdap [Tetanus-Diphth-Acell Pertussis] Hives and Rash   Recent Results (from the past 2160 hour(s))  NM Myocar Multi W/Spect W/Wall Motion / EF     Status: None   Collection Time: 10/07/19 11:02 AM  Result Value Ref Range   Rest HR 65 bpm   Rest BP 140/98 mmHg   Exercise duration (sec) 0 sec   Percent HR 62 %   Exercise duration (min) 0 min   Estimated workload 1.0 METS   Peak HR 112 bpm   Peak BP 140/98 mmHg   MPHR 179 bpm   SSS 9    SRS 0    SDS 5    TID 0.82    LV sys vol 36 mL   LV dias vol 92 62 - 150 mL  CBC     Status: None   Collection Time:  10/16/19 11:05 AM  Result Value Ref Range   WBC 5.3 3.4 - 10.8 x10E3/uL   RBC 5.22 4.14 - 5.80 x10E6/uL   Hemoglobin 16.2  13.0 - 17.7 g/dL   Hematocrit 47.0 37.5 - 51.0 %   MCV 90 79 - 97 fL   MCH 31.0 26.6 - 33.0 pg   MCHC 34.5 31 - 35 g/dL   RDW 12.3 11.6 - 15.4 %   Platelets 240 150 - 450 M22Q3/FH  Basic Metabolic Panel (BMET)     Status: None   Collection Time: 10/16/19 11:05 AM  Result Value Ref Range   Glucose 93 65 - 99 mg/dL   BUN 10 6 - 24 mg/dL   Creatinine, Ser 1.02 0.76 - 1.27 mg/dL   GFR calc non Af Amer 91 >59 mL/min/1.73   GFR calc Af Amer 105 >59 mL/min/1.73    Comment: **[facility] currently reports eGFR in compliance with the current**   recommendations of the [employer]. [facility] will   update reporting as new guidelines are published from the NKF-ASN   Task force.    BUN/Creatinine Ratio 10 9 - 20   Sodium 143 134 - 144 mmol/L   Potassium 4.7 3.5 - 5.2 mmol/L   Chloride 105 96 - 106 mmol/L   CO2 25 20 - 29 mmol/L   Calcium 9.5 8.7 - 10.2 mg/dL  SARS CORONAVIRUS 2 (TAT 6-24 HRS) Nasopharyngeal Nasopharyngeal Swab     Status: None   Collection Time: 10/16/19 11:53 AM   Specimen: Nasopharyngeal Swab  Result Value Ref Range   SARS Coronavirus 2 NEGATIVE NEGATIVE    Comment: (NOTE) SARS-CoV-2 target nucleic acids are NOT DETECTED. The SARS-CoV-2 RNA is generally detectable in upper and lower respiratory specimens during the acute phase of infection. Negative results do not preclude SARS-CoV-2 infection, do not rule out co-infections with other pathogens, and should not be used as the sole basis for treatment or other patient management decisions. Negative results must be combined with clinical observations, patient history, and epidemiological information. The expected result is Negative. Fact Sheet for Patients: SugarRoll.be Fact Sheet for Healthcare Providers: https://www.woods-mathews.com/ This test  is not yet approved or cleared by the [place] FDA and  has been authorized for detection and/or diagnosis of SARS-CoV-2 by FDA under an Emergency Use Authorization (EUA). This EUA will remain  in effect (meaning this test can be used) for the duration of the COVID-19 declaration under Section 56 4(b)(1) of the Act, 21 U.S.C. section 360bbb-3(b)(1), unless the authorization is terminated or revoked sooner. Performed at [facility], [place] [address], [place], [place] [address]   I-STAT 7, (LYTES, BLD GAS, ICA, H+H)     Status: None   Collection Time: 10/18/19 11:49 AM  Result Value Ref Range   pH, Arterial 7.402 7.35 - 7.45   pCO2 arterial 43.0 32 - 48 mmHg   pO2, Arterial 95 83 - 108 mmHg   Bicarbonate 26.8 20.0 - 28.0 mmol/L   TCO2 28 22 - 32 mmol/L   O2 Saturation 97.0 %   Acid-Base Excess 2.0 0.0 - 2.0 mmol/L   Sodium 143 135 - 145 mmol/L   Potassium 4.0 3.5 - 5.1 mmol/L   Calcium, Ion 1.24 1.15 - 1.40 mmol/L   HCT 44.0 39 - 52 %   Hemoglobin 15.0 13.0 - 17.0 g/dL   Sample type ARTERIAL   POCT I-Stat EG7     Status: None   Collection Time: 10/18/19 11:50 AM  Result Value Ref Range   pH, Ven 7.388 7.25 - 7.43   pCO2, Ven 45.8 44 - 60 mmHg   pO2, Ven 43.0 32 - 45  mmHg   Bicarbonate 27.6 20.0 - 28.0 mmol/L   TCO2 29 22 - 32 mmol/L   O2 Saturation 78.0 %   Acid-Base Excess 2.0 0.0 - 2.0 mmol/L   Sodium 144 135 - 145 mmol/L   Potassium 4.0 3.5 - 5.1 mmol/L   Calcium, Ion 1.25 1.15 - 1.40 mmol/L   HCT 44.0 39 - 52 %   Hemoglobin 15.0 13.0 - 17.0 g/dL   Sample type VENOUS   C-reactive protein     Status: None   Collection Time: 10/30/19 11:57 AM  Result Value Ref Range   CRP 0.5 <1.0 mg/dL    Comment: Performed at [facility], [place] [address], [place], [place] [address]  Sedimentation rate     Status: None   Collection Time: 10/30/19 11:57 AM  Result Value Ref Range   Sed Rate 3 0 - 15 mm/hr    Comment: Performed at [facility], [address] [place], [place] [address]  Fibrin derivatives D-Dimer [facility] only)     Status: None   Collection Time: 10/30/19 11:57 AM  Result Value Ref Range   Fibrin derivatives D-dimer ([facility]) 210.35 0.00 - 499.00 ng/mL (FEU)    Comment: (NOTE) <> Exclusion of Venous Thromboembolism (VTE) - OUTPATIENT ONLY   (Emergency Department or [place])   0-499 ng/ml (FEU): With a low to intermediate pretest probability                      for VTE this test result excludes the diagnosis                      of VTE.   >499 ng/ml (FEU) : VTE not excluded; additional work up for VTE is                      required. <> Testing on Inpatients and Evaluation of Disseminated Intravascular   Coagulation (DIC) Reference Range:   0-499 ng/ml (FEU) Performed at [facility], [address] [place], [place] [address]   Lab report - scanned     Status: None   Collection Time: 11/25/19 12:00 AM  Result Value Ref Range   INR 2.8    Objective  Body mass index is 25.94 kg/m. Wt Readings from Last 3 Encounters:  12/19/19 202 lb (91.6 kg)  11/26/19 198 lb 6 oz (90 kg)  10/30/19 200 lb 4 oz (90.8 kg)   Temp Readings from Last 3 Encounters:  12/19/19 98.2 F (36.8 C) (Oral)  10/18/19 (!) 97 F (36.1 C)  04/09/19 (!) 97.4 F (36.3 C) (Skin)   BP Readings from Last 3 Encounters:  12/19/19 124/84  11/26/19 (!) 124/92  10/30/19 120/80   Pulse Readings from Last 3 Encounters:  12/19/19 60  11/26/19 61  10/30/19 70    Physical Exam Vitals and nursing note reviewed.  Constitutional:      Appearance: Normal appearance. He is well-developed, well-groomed and overweight.  HENT:     Head: Normocephalic and atraumatic.  Eyes:     Conjunctiva/sclera: Conjunctivae normal.     Pupils: Pupils are equal, round, and reactive to light.  Cardiovascular:     Rate and Rhythm: Normal rate and regular rhythm.     Heart sounds: Normal heart sounds. No murmur heard.   Pulmonary:     Effort: Pulmonary effort  is normal.     Breath sounds: Normal breath sounds.  Skin:  General: Skin is warm and dry.  Neurological:     General: No focal deficit present.     Mental Status: He is alert and oriented to person, place, and time. Mental status is at baseline.     Gait: Gait normal.  Psychiatric:        Attention and Perception: Attention and perception normal.        Mood and Affect: Mood and affect normal.        Speech: Speech normal.        Behavior: Behavior normal. Behavior is cooperative.        Thought Content: Thought content normal.        Cognition and Memory: Cognition and memory normal.        Judgment: Judgment normal.     Assessment  Plan  Exercise-induced asthma - Plan: albuterol (VENTOLIN HFA) 108 (90 Base) MCG/ACT inhaler Consider pulm  Hyperlipidemia, unspecified hyperlipidemia type - Plan: Lipid panel, Hepatic function panel  Gastroesophageal reflux disease without esophagitis - Plan: pantoprazole (PROTONIX) 40 MG tablet  Prediabetes - Plan: Hemoglobin A1c  Inguinal hernia, right  No need referral now   HM Flu shotutd utd Tdap had 06/20/11 and 01/04/2018 [facility] rec MMR vaccine covid 2/2 pfizer  Completed hep B 3/3 vxs 12/2017 [facility] de[facility] -consider check titer in future if not done hep Bin future Prediabetes6.0A1C9/3/20rec healthy diet and exercise Dermatology saw [facility] 2019 left scalp bx negative   07/25/2018 stomach bx with EGD Dr. Gustavo Lah mild chronic gastritis and mild foveolar hyperplasia negative H pylori no repeat , neg metaplasianegative barrettsor adenoca -no repeat EGD needed  3/4/2020Colonoscopy per report due in 5 years -mild chronic gastritis and foveolar hyperplasia  EGD 01/14/14 +gastritis negative bxs for H pylori or any other etiology   PSA 01/24/19 1.5normal  Of note:  normal spirometry 12/25/12  Negative small bowel series 05/29/13   CT renal 10/07/16 -no c/o back pain hold imaging for  now as of 01/23/19 had Xray 08/2018 ED, MRI L spine 11/09/16 and 10/27/07  IMPRESSION: 1. No acute findings. No renal or ureteral stones. No obstructive uropathy. 2. Status post cholecystectomy. 3. Evidence of a broad-based disc protrusion at L4-L5 narrowing the right lateral recess. Consider further assessment with lumbar MRI with and without contrast if the patient has symptoms consistent with nerve impingement.   Xray eye 11/09/16 IMPRESSION: 1. No evidence of metallic foreign body within the orbits. 2. 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  -of note pt wants removed  US thyroid 12/14/09 normal  Stress test negative unc 01/05/18 and CXR 12/25/17 negative   Eye Dr. Marvel Plan   Provider: Dr. Olivia Mackie [name]-Internal Medicine

## 2019-12-19 NOTE — Patient Instructions (Signed)

## 2019-12-19 NOTE — Progress Notes (Signed)
Patient flagged: Current status:  PATIENT IS OVERDUE FOR BMI FOLLOW UP PLAN BMI is estimated to be 25.9 based on the last recorded weight and height

## 2019-12-20 ENCOUNTER — Telehealth: Payer: Self-pay

## 2019-12-20 ENCOUNTER — Telehealth: Payer: Self-pay | Admitting: Psychiatry

## 2019-12-20 LAB — HEPATITIS B SURFACE ANTIBODY, QUANTITATIVE: Hep B S AB Quant (Post): >1000 m[IU]/mL (ref >=10)

## 2019-12-20 MED ORDER — FLUOXETINE HCL 10 MG PO CAPS: 10.0000 mg | ORAL_CAPSULE | Freq: Every day | ORAL | 0 refills | Status: DC

## 2019-12-20 NOTE — Telephone Encounter (Signed)
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  Discussed with patient to sign a release so we can send [facility] to [facility].  Patient reports he will be here today to sign the release request.  I have completed the DOT form and once patient signs a release of information will fax it to them.

## 2019-12-20 NOTE — Telephone Encounter (Signed)
faxed and confirmed forms that needed to be completed for dot

## 2019-12-23 ENCOUNTER — Telehealth: Payer: Self-pay

## 2019-12-23 DIAGNOSIS — R0602 Shortness of breath: Secondary | ICD-10-CM

## 2019-12-23 NOTE — Telephone Encounter (Signed)
-----   Message from Rise Mu, PA-C sent at 12/22/2019  8:07 AM EDT ----- Cardiopulmonary stress test showed mild deconditioning without clear evidence of cardiopulmonary abnormalities. Please refer him to cardiopulmonary rehab. If symptoms persist in 6 months, we can repeat this study to trend.

## 2019-12-23 NOTE — Telephone Encounter (Signed)
Call to patient to review CP stress test results.    Pt verbalized understanding and has no further questions at this time.    Advised pt to call for any further questions or concerns.  Order placed for rehab.

## 2019-12-24 ENCOUNTER — Ambulatory Visit: Payer: BC Managed Care – PPO | Admitting: Podiatry

## 2019-12-24 ENCOUNTER — Other Ambulatory Visit: Payer: Self-pay | Admitting: *Deleted

## 2019-12-24 DIAGNOSIS — R0600 Dyspnea, unspecified: Secondary | ICD-10-CM

## 2019-12-25 ENCOUNTER — Encounter: Payer: Self-pay | Admitting: Internal Medicine

## 2019-12-25 ENCOUNTER — Telehealth: Payer: Self-pay

## 2019-12-25 ENCOUNTER — Other Ambulatory Visit: Payer: Self-pay

## 2019-12-25 ENCOUNTER — Ambulatory Visit (INDEPENDENT_AMBULATORY_CARE_PROVIDER_SITE_OTHER): Payer: BC Managed Care – PPO | Admitting: Internal Medicine

## 2019-12-25 VITALS — BP 116/78 | HR 50 | Ht 74.0 in | Wt 202.8 lb

## 2019-12-25 DIAGNOSIS — R001 Bradycardia, unspecified: Secondary | ICD-10-CM | POA: Diagnosis not present

## 2019-12-25 DIAGNOSIS — R0602 Shortness of breath: Secondary | ICD-10-CM | POA: Diagnosis not present

## 2019-12-25 DIAGNOSIS — F4310 Post-traumatic stress disorder, unspecified: Secondary | ICD-10-CM

## 2019-12-25 DIAGNOSIS — I471 Supraventricular tachycardia: Secondary | ICD-10-CM

## 2019-12-25 DIAGNOSIS — F419 Anxiety disorder, unspecified: Secondary | ICD-10-CM

## 2019-12-25 DIAGNOSIS — R079 Chest pain, unspecified: Secondary | ICD-10-CM

## 2019-12-25 MED ORDER — METOPROLOL SUCCINATE ER 25 MG PO TB24
12.5000 mg | ORAL_TABLET | Freq: Every day | ORAL | 4 refills | Status: DC
Start: 2019-12-25 — End: 2020-02-14

## 2019-12-25 NOTE — Patient Instructions (Signed)
Medication Instructions:  Your physician has recommended you make the following change in your medication:  1- DECREASE Metoprolol to 12.5 mg (0.5 tablet) by mouth once a day.  *If you need a refill on your cardiac medications before your next appointment, please call your pharmacy*  Follow-Up: At [facility], you and your health needs are our priority.  As part of our continuing mission to provide you with exceptional heart care, we have created designated Provider Care Teams.  These Care Teams include your primary Cardiologist (physician) and Advanced Practice Providers (APPs -  Physician Assistants and Nurse Practitioners) who all work together to provide you with the care you need, when you need it.  We recommend signing up for the patient portal called "MyChart".  Sign up information is provided on this After Visit Summary.  MyChart is used to connect with patients for Virtual Visits (Telemedicine).  Patients are able to view lab/test results, encounter notes, upcoming appointments, etc.  Non-urgent messages can be sent to your provider as well.   To learn more about what you can do with MyChart, go to [email or web address].    Your next appointment:   Keep appointment as scheduled.   The format for your next appointment:   In Person

## 2019-12-25 NOTE — Progress Notes (Signed)
Follow-up Outpatient Visit Date: 12/25/2019  Primary Care Provider: [name], [name], [place] [address]  Primary Cardiologist: Kathlyn Sacramento, MD  Chief Complaint: Low heart rates, elevated heart rates, dizziness, fatigue and chest pain  HPI:  Sergio Garcia is a 44 y.o. male with history of normal coronary arteries by LHC on 10/18/2019, paroxysmal SVT, Gilbert'ssyndrome, IBS, GERD, strong family history of CAD, PTSD,anxiety, and depression , who presents for urgent evaluation of labile heart rates.  Sergio Garcia states that his heart rates have fluctuated between 40 and 180 bpm over the last several months.  The elevated heart rates typically last 5 minutes or less, though he then "feels terrible all day."  He has been unable to capture any rhythm strips of the tachycardia on his Apple Watch.  He reports almost constant lightheadedness, especially when standing.  He also has constant light pressure in the center and left side of his chest.  He feels short of breath frequently, such as when walking to his mailbox.  He notes mild orthopnea, at times having to elevate his head on a folded over pillow to improve his breathing.  He denies PND and leg edema.  He has been compliant with his medications.  He feels like his symptoms worsened with addition of metoprolol following recent event monitor showed very brief episodes of PSVT.  --------------------------------------------------------------------------------------------------  Past Medical History:  Diagnosis Date  . Anxiety   . Degenerative disc disease   . Depression   . Enteritis   . Gastritis   . GERD (gastroesophageal reflux disease)   . Gilbert's syndrome    hyperbilirubinemia  . IBS (irritable bowel syndrome)   . Inguinal hernia   . Insomnia   . Prostatitis 2008   Past Surgical History:  Procedure Laterality Date  . BACK SURGERY     2 ruptured discs  . CHOLECYSTECTOMY  01/31/2014   GB polyp,  chronic cholecystitis   . ESOPHAGOGASTRODUODENOSCOPY  09/2004   gastritis acute and duodenitis  . ESOPHAGOGASTRODUODENOSCOPY  01-14-14   Dr Allen Norris  . EXPLORATORY LAPAROTOMY  Jan 2015   Dr Burt Knack  . LASIK    . RIGHT/LEFT HEART CATH AND CORONARY ANGIOGRAPHY N/A 10/18/2019   Procedure: RIGHT/LEFT HEART CATH AND CORONARY ANGIOGRAPHY;  Surgeon: Wellington Hampshire, MD;  Location: [facility];  Service: Cardiovascular;  Laterality: N/A;    Current Meds  Medication Sig  . albuterol (VENTOLIN HFA) 108 (90 Base) MCG/ACT inhaler Inhale 1-2 puffs into the lungs every 6 (six) hours as needed for wheezing or shortness of breath.  . cetirizine-pseudoephedrine (ZYRTEC-D) 5-120 MG tablet Take 1 tablet by mouth 2 (two) times daily.  . clonazePAM (KLONOPIN) 0.5 MG tablet Take 1 tablet (0.5 mg total) by mouth 2 (two) times daily as needed for anxiety.  Marland Kitchen FLUoxetine (PROZAC) 20 MG capsule Take 1 capsule (20 mg total) by mouth daily. To be combined with 40 mg  . FLUoxetine (PROZAC) 40 MG capsule Take 1 capsule (40 mg total) by mouth daily. To be combined with 20 mg  . gentamicin cream (GARAMYCIN) 0.1 % Apply 1 application topically 2 (two) times daily.  . isosorbide mononitrate (IMDUR) 30 MG 24 hr tablet Take 1 tablet (30 mg total) by mouth daily.  . metoprolol succinate (TOPROL XL) 25 MG 24 hr tablet Take 1 tablet (25 mg total) by mouth daily.  . ondansetron (ZOFRAN) 4 MG tablet Take by mouth.  . pantoprazole (PROTONIX) 40 MG tablet Take 1 tablet (40  mg total) by mouth daily. 30 min before food  . QUEtiapine (SEROQUEL) 25 MG tablet Take 1.5 tablets (37.5 mg total) by mouth at bedtime.  . SUMAtriptan (IMITREX) 50 MG tablet Take 1 tablet (50 mg total) by mouth as needed for migraine. May repeat in 2 hours if headache persists or recurs. No more than 2x per week. No more than 200 mg total/24 hours  . terbinafine (LAMISIL) 250 MG tablet Take 1 tablet (250 mg total) by mouth daily.    Allergies: Morphine and  related, Morphine, and Tdap [tetanus-diphth-acell pertussis]  Social History   Tobacco Use  . Smoking status: Never Smoker  . Smokeless tobacco: Never Used  Vaping Use  . Vaping Use: Never used  Substance Use Topics  . Alcohol use: Yes    Comment: occassional social  . Drug use: No    Family History  Problem Relation Age of Onset  . Colon polyps Mother   . Cancer Mother        sinus cavity, on XRT  . Heart disease Father   . Heart attack Father        x 2  . Pancreatic cancer Maternal Grandfather   . Colon cancer Maternal Grandfather   . Diabetes Other        strong FH d both sides of family   . Heart Problems Paternal Grandfather   . Cancer Maternal Uncle   . Stroke Cousin        age 61/44  . Mental illness Neg Hx     Review of Systems: A 12-system review of systems was performed and was negative except as noted in the HPI.  --------------------------------------------------------------------------------------------------  Physical Exam: BP 116/78 (BP Location: Left Arm, Patient Position: Sitting, Cuff Size: Normal)   Pulse (!) 50   Ht 6\' 2"  (1.88 m)   Wt 202 lb 12.8 oz (92 kg)   SpO2 98%   BMI 26.04 kg/m   Position Blood pressure (mmHg) Heart rate (bpm)  Lying 111/70 51  Sitting 115/66 56  Standing 106/65 64  Standing (3 minutes) 117/76 62   General:  Anxious-appearing. Neck: No JVD or HJR. Lungs: CTA bilaterally. Heart: Bradycardic but regular without murmurs. Abd: Soft, NT/ND. Extremities: No LE edema.  EKG:  Sinus bradycardia.  Otherwise, no significant abnormality.  Lab Results  Component Value Date   WBC 5.3 10/16/2019   HGB 15.0 10/18/2019   HCT 44.0 10/18/2019   MCV 90 10/16/2019   PLT 240 10/16/2019    Lab Results  Component Value Date   NA 144 10/18/2019   K 4.0 10/18/2019   CL 105 10/16/2019   CO2 25 10/16/2019   BUN 10 10/16/2019   CREATININE 1.02 10/16/2019   GLUCOSE 93 10/16/2019   ALT 14 12/19/2019    Lab Results    Component Value Date   CHOL 183 12/19/2019   HDL 41.40 12/19/2019   LDLCALC 124 (H) 12/19/2019   TRIG 90.0 12/19/2019   CHOLHDL 4 12/19/2019    --------------------------------------------------------------------------------------------------  ASSESSMENT AND PLAN: Bradycardia and PSVT: EKG today notable for sinus bradycardia.  Recent event monitor showed two brief atrial runs lasting up to 8 beats.  Given bradycardia today and worsening symptoms following addition of metoprolol, I have asked Sergio Garcia to cut metoprolol succinate in half to 12.5 mg daily.  If he continues to have symptoms, he may ultimately require EP consultation.  In the meantime, I have encouraged Sergio Garcia to attempt to capture any episodes of tachycardia  using the rhythm strip function of his Apple Watch.  I also encouraged him to stay well hydrated and to avoid caffeine.  Of note, orthostatic vital signs were unremarkable.  Chest pain and shortness of breath: Chronic with extensive workup thus far (echo, R/LHC, and CPX) notable only for mildly impaired function capacity due to deconditioning.  In encouraged Sergio Garcia to proceed with cardiopulmonary rehab.  Anxiety/PTSD: Likely contributing to aforementioned symptoms.  It is possible that his psychotropic medications could be contributing to some of his symptoms as well.  I will defer further management to Sergio Garcia' mental health provider.  Follow-up: Return to clinic with APP in ~1 month.  Nelva Bush, MD 12/25/2019 4:18 PM

## 2019-12-25 NOTE — Telephone Encounter (Signed)
Call to patient to discuss message sent to triage.   He reports c/o 40-180s with no activity. Has dizziness and fatigue.   I told patient that I would like for him to come to office this afternoon for appt with Dr. Saunders Revel, DOD.   I requested he have family member bring him to office, is he has any new or worsening sx to seek urgent medical care.   Advised pt to call for any further questions or concerns.

## 2019-12-26 ENCOUNTER — Ambulatory Visit (INDEPENDENT_AMBULATORY_CARE_PROVIDER_SITE_OTHER): Payer: BC Managed Care – PPO | Admitting: Licensed Clinical Social Worker

## 2019-12-26 ENCOUNTER — Encounter: Payer: Self-pay | Admitting: Licensed Clinical Social Worker

## 2019-12-26 DIAGNOSIS — F4310 Post-traumatic stress disorder, unspecified: Secondary | ICD-10-CM | POA: Diagnosis not present

## 2019-12-26 DIAGNOSIS — R001 Bradycardia, unspecified: Secondary | ICD-10-CM | POA: Insufficient documentation

## 2019-12-26 DIAGNOSIS — I471 Supraventricular tachycardia: Secondary | ICD-10-CM | POA: Insufficient documentation

## 2019-12-26 NOTE — Progress Notes (Signed)
Patient Location: Home  Provider Location: Home Office   Virtual Visit via Video Note  I connected with Sergio Garcia on 12/26/19 at 10:00 AM EDT by a video enabled telemedicine application and verified that I am speaking with the correct person using two identifiers.   I discussed the limitations of evaluation and management by telemedicine and the availability of in person appointments. The patient expressed understanding and agreed to proceed.  THERAPY PROGRESS NOTE  Session Time: 30 Minutes  Participation Level: Active  Behavioral Response: CasualAlertDepressed  Type of Therapy: Individual Therapy  Treatment Goals addressed: Coping and Diagnosis: PTSD  Interventions: CBT  Summary: Sergio Garcia is a 44 y.o. male who presents with dx of PTSD. Pt reported that he has been having chest pains and has recently seen cardiologist for stress test. His employer has allowed him to have less physically exhausting work and patient is staying home today trying to get some rest. Pt reported he continues to struggle with trauma sxs, often ruminating and avoiding talking about it with support system. Pt identified trauma sxs and engaged in grounding exercises.   Suicidal/Homicidal: No  Therapist Response: Therapist met with patient for follow up session. Therapist and patient discussed patient's sxs and coping skills he is using. Therapist provided psycho-education on common reactions to trauma and grounding techniques. Pt was very receptive. Therapist assigned patient homework of using these grounding techniques as a thought stopping technique between now and next session.  Plan: Return again in 1 week.  Diagnosis: Axis I: Post Traumatic Stress Disorder    Axis II: N/A  Josephine Igo, LCSW, LCAS 12/26/2019

## 2019-12-30 ENCOUNTER — Telehealth: Payer: Self-pay

## 2019-12-30 NOTE — Telephone Encounter (Signed)
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

## 2019-12-30 NOTE — Telephone Encounter (Signed)
Please ask him to fax or drop the form back to [place] with requested correction and once I review it , I will call him back with questions. Thanks

## 2020-01-01 ENCOUNTER — Telehealth: Payer: Self-pay | Admitting: Psychiatry

## 2020-01-01 DIAGNOSIS — F411 Generalized anxiety disorder: Secondary | ICD-10-CM

## 2020-01-01 MED ORDER — CLONAZEPAM 0.5 MG PO TABS: 0.5000 mg | ORAL_TABLET | Freq: Every day | ORAL | 1 refills | Status: DC | PRN

## 2020-01-01 NOTE — Telephone Encounter (Signed)
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  Patient is currently on a lower dose of Klonopin.  He also has not been using it every day.  He has been using it only as needed.  I have made those corrections on the DOT form.  We will have [place] fax it.

## 2020-01-02 ENCOUNTER — Ambulatory Visit (INDEPENDENT_AMBULATORY_CARE_PROVIDER_SITE_OTHER): Payer: BC Managed Care – PPO | Admitting: Licensed Clinical Social Worker

## 2020-01-02 ENCOUNTER — Encounter: Payer: Self-pay | Admitting: Licensed Clinical Social Worker

## 2020-01-02 ENCOUNTER — Other Ambulatory Visit: Payer: Self-pay

## 2020-01-02 DIAGNOSIS — F4310 Post-traumatic stress disorder, unspecified: Secondary | ICD-10-CM

## 2020-01-02 NOTE — Progress Notes (Signed)
Patient Location: Home  Provider Location: Home Office   Virtual Visit via Video Note  I connected with Sergio Garcia on 01/02/20 at  8:00 AM EDT by a video enabled telemedicine application and verified that I am speaking with the correct person using two identifiers.   I discussed the limitations of evaluation and management by telemedicine and the availability of in person appointments. The patient expressed understanding and agreed to proceed.  THERAPY PROGRESS NOTE  Session Time: 30 Minutes  Participation Level: Active  Behavioral Response: Well GroomedAlertEuthymic  Type of Therapy: Individual Therapy  Treatment Goals addressed: Coping  Interventions: CBT  Summary: Sergio Garcia is a 44 y.o. male who presents with dx of PTSD. Pt reported that he is feeling better and is attending cardiopulmonary appointments to work on strengthening his heart. Pt reported that he is using the grounding techniques he learned last session and they seem to be helping. Pt reported that holidays are sometimes difficult to enjoy due to grief and loss. Pt reported losing his grandfather who he was really close to and is making sure to connect often with grandmother who is also still grieving. Pt also recalled tragedies that he has witnessed on the job or with people who he has known that occurred close to the holidays. Pt identified ways of coping and how he can honor the memory of loved ones.   Suicidal/Homicidal: No  Therapist Response: Therapist met with patient for follow up session. Therapist and patient reviewed homework from last week. Therapist and patient discussed grief and loss issues and ways to cope. Pt was very receptive.    Plan: Return again in 1 week.  Diagnosis: Axis I: Post Traumatic Stress Disorder    Axis II: N/A  Josephine Igo, LCSW, LCAS 01/02/2020

## 2020-01-03 ENCOUNTER — Encounter: Payer: BC Managed Care – PPO | Attending: Cardiovascular Disease | Admitting: *Deleted

## 2020-01-03 ENCOUNTER — Other Ambulatory Visit: Payer: Self-pay

## 2020-01-03 ENCOUNTER — Encounter: Payer: Self-pay | Admitting: *Deleted

## 2020-01-03 DIAGNOSIS — R0600 Dyspnea, unspecified: Secondary | ICD-10-CM

## 2020-01-03 NOTE — Progress Notes (Signed)
Virtual orientation call completed today. he has an appointment on Date: [id number] for EP eval and gym Orientation.  Documentation of diagnosis can be found in [facility] Date: 11/26/2019.

## 2020-01-09 ENCOUNTER — Other Ambulatory Visit: Payer: Self-pay

## 2020-01-09 ENCOUNTER — Ambulatory Visit: Payer: BC Managed Care – PPO | Admitting: Licensed Clinical Social Worker

## 2020-01-13 ENCOUNTER — Telehealth (INDEPENDENT_AMBULATORY_CARE_PROVIDER_SITE_OTHER): Payer: BC Managed Care – PPO | Admitting: Psychiatry

## 2020-01-13 ENCOUNTER — Encounter: Payer: Self-pay | Admitting: Psychiatry

## 2020-01-13 ENCOUNTER — Other Ambulatory Visit: Payer: Self-pay

## 2020-01-13 DIAGNOSIS — F5105 Insomnia due to other mental disorder: Secondary | ICD-10-CM | POA: Diagnosis not present

## 2020-01-13 DIAGNOSIS — F4310 Post-traumatic stress disorder, unspecified: Secondary | ICD-10-CM | POA: Diagnosis not present

## 2020-01-13 DIAGNOSIS — F411 Generalized anxiety disorder: Secondary | ICD-10-CM

## 2020-01-13 DIAGNOSIS — F410 Panic disorder [episodic paroxysmal anxiety] without agoraphobia: Secondary | ICD-10-CM | POA: Diagnosis not present

## 2020-01-13 MED ORDER — QUETIAPINE FUMARATE 25 MG PO TABS: 37.5000 mg | ORAL_TABLET | Freq: Every day | ORAL | 0 refills | Status: DC

## 2020-01-13 NOTE — Progress Notes (Signed)
Provider Location : [facility] Patient Location : [place]  Participants: Patient , Provider Virtual Visit via Video Note  I connected with [name] on 01/13/20 at  1:40 PM EDT by a video enabled telemedicine application and verified that I am speaking with the correct person using two identifiers.   I discussed the limitations of evaluation and management by telemedicine and the availability of in person appointments. The patient expressed understanding and agreed to proceed.    I discussed the assessment and treatment plan with the patient. The patient was provided an opportunity to ask questions and all were answered. The patient agreed with the plan and demonstrated an understanding of the instructions.   The patient was advised to call back or seek an in-person evaluation if the symptoms worsen or if the condition fails to improve as anticipated.   [place] MD OP Progress Note  01/13/2020 3:50 PM ZARIUS FURR  MRN:  [id number]  Chief Complaint:  Chief Complaint    Follow-up     HPI: CONG HIGHTOWER is a 44 year old male, employed, lives in [place], has a history of PTSD, GAD, insomnia, GERD, PVC, Gilbert's syndrome, elevated LFT was evaluated by telemedicine today.  Patient today reports he is currently making progress with regards to his mood.  He reports when he started taking the Prozac 60 mg he started noticing shakiness and tremors of his upper extremities.  Patient reports he hence went down to Prozac 40 mg and the tremors have improved.  He feels much better.  He reports he had an episode of depressive symptoms last week when he felt sad and down.  However he feels much better this week and denies any significant sadness or anxiety symptoms.  Since the last time patient spoke to Probation officer he reports he has taken only 1 or 2 tablets of clonazepam as needed.  He continues to limit use and denies any significant panic attacks.  He reports sleep continues to be good.  He  denies any suicidality, homicidality or perceptual disturbances.  He reports therapy sessions as beneficial.  He reports he has been referred to cardiac pulmonary rehabilitation and is planning to do it 3 times a week starting next week.  Patient denies any other concerns today.  Visit Diagnosis:    ICD-10-CM   1. PTSD (post-traumatic stress disorder)  F43.10 QUEtiapine (SEROQUEL) 25 MG tablet  2. Generalized anxiety disorder  F41.1 QUEtiapine (SEROQUEL) 25 MG tablet  3. 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  Past trials of Wellbutrin, Zoloft, Effexor, Klonopin  Past Medical History:  Past Medical History:  Diagnosis Date  . Anxiety   . Degenerative disc disease   . Depression   . Enteritis   . Gastritis   . GERD (gastroesophageal reflux disease)   . Gilbert's syndrome    hyperbilirubinemia  . IBS (irritable bowel syndrome)   . Inguinal hernia   . Insomnia   . Prostatitis 2008    Past Surgical History:  Procedure Laterality Date  . BACK SURGERY     2 ruptured discs  . CHOLECYSTECTOMY  01/31/2014   GB polyp, chronic cholecystitis   . ESOPHAGOGASTRODUODENOSCOPY  09/2004   gastritis acute and duodenitis  . ESOPHAGOGASTRODUODENOSCOPY  01-14-14   Dr Allen Norris  . EXPLORATORY LAPAROTOMY  Jan 2015   Dr Burt Knack  .  LASIK    . RIGHT/LEFT HEART CATH AND CORONARY ANGIOGRAPHY N/A 10/18/2019   Procedure: RIGHT/LEFT HEART CATH AND CORONARY ANGIOGRAPHY;  Surgeon: Wellington Hampshire, MD;  Location: [facility];  Service: Cardiovascular;  Laterality: N/A;    Family Psychiatric History: I have reviewed family psychiatric history from my progress note on 09/20/2018  Family History:  Family History  Problem Relation Age of Onset  . Colon polyps Mother   . Cancer Mother        sinus cavity, on XRT  . Heart disease  Father   . Heart attack Father        x 2  . Pancreatic cancer Maternal Grandfather   . Colon cancer Maternal Grandfather   . Diabetes Other        strong FH d both sides of family   . Heart Problems Paternal Grandfather   . Cancer Maternal Uncle   . Stroke Cousin        age 13/44  . Mental illness Neg Hx     Social History: I have reviewed social history from my progress note on 09/20/2018 Social History   Socioeconomic History  . Marital status: Divorced    Spouse name: Not on file  . Number of children: 2  . Years of education: Not on file  . Highest education level: High school graduate  Occupational History  . Occupation: Music therapist: [employer]  Tobacco Use  . Smoking status: Never Smoker  . Smokeless tobacco: Never Used  Vaping Use  . Vaping Use: Never used  Substance and Sexual Activity  . Alcohol use: Yes    Comment: occassional social  . Drug use: No  . Sexual activity: Yes  Other Topics Concern  . Not on file  Social History Narrative   Married.   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   Enjoys watching racing, Dealer, deer hunting.             Social Determinants of Health   Financial Resource Strain:   . Difficulty of Paying Living Expenses: Not on file  Food Insecurity:   . Worried About Charity fundraiser in the Last Year: Not on file  . Ran Out of Food in the Last Year: Not on file  Transportation Needs:   . Lack of Transportation (Medical): Not on file  . Lack of Transportation (Non-Medical): Not on file  Physical Activity:   . Days of Exercise per Week: Not on file  . Minutes of Exercise per Session: Not on file  Stress:   . Feeling of Stress : Not on file  Social Connections:   . Frequency of Communication with Friends and Family: Not on file  . Frequency of Social Gatherings with Friends and Family: Not on file  . Attends Religious Services: Not on file  . Active Member of Clubs or  Organizations: Not on file  . Attends Archivist Meetings: Not on file  . Marital Status: Not on file    Allergies:  Allergies  Allergen Reactions  . Morphine And Related Anaphylaxis  . Morphine Nausea And Vomiting  . Tdap [Tetanus-Diphth-Acell Pertussis] Hives and Rash    Metabolic Disorder Labs: Lab Results  Component Value Date   HGBA1C 6.0 12/19/2019   No results found for: PROLACTIN Lab Results  Component Value Date   CHOL 183 12/19/2019   TRIG 90.0 12/19/2019  HDL 41.40 12/19/2019   CHOLHDL 4 12/19/2019   VLDL 18.0 12/19/2019   LDLCALC 124 (H) 12/19/2019   LDLCALC 108 (H) 01/24/2019   Lab Results  Component Value Date   TSH 1.400 01/24/2019   TSH 1.88 03/02/2018    Therapeutic Level Labs: No results found for: LITHIUM No results found for: VALPROATE No components found for:  CBMZ  Current Medications: Current Outpatient Medications  Medication Sig Dispense Refill  . albuterol (VENTOLIN HFA) 108 (90 Base) MCG/ACT inhaler Inhale 1-2 puffs into the lungs every 6 (six) hours as needed for wheezing or shortness of breath. 18 g 11  . cetirizine-pseudoephedrine (ZYRTEC-D) 5-120 MG tablet Take 1 tablet by mouth 2 (two) times daily. 30 tablet 0  . clonazePAM (KLONOPIN) 0.5 MG tablet Take 1 tablet (0.5 mg total) by mouth daily as needed for anxiety. 30 tablet 1  . COVID-19 Specimen Collection KIT See admin instructions. for testing    . FLUoxetine (PROZAC) 40 MG capsule Take 1 capsule (40 mg total) by mouth daily. To be combined with 20 mg 90 capsule 0  . gentamicin cream (GARAMYCIN) 0.1 % Apply 1 application topically 2 (two) times daily. (Patient not taking: Reported on 01/03/2020) 15 g 1  . isosorbide mononitrate (IMDUR) 30 MG 24 hr tablet Take 1 tablet (30 mg total) by mouth daily. (Patient not taking: Reported on 01/03/2020) 90 tablet 3  . metoprolol succinate (TOPROL XL) 25 MG 24 hr tablet Take 0.5 tablets (12.5 mg total) by mouth daily. (Patient not  taking: Reported on 01/03/2020) 30 tablet 4  . ondansetron (ZOFRAN) 4 MG tablet Take by mouth.    . pantoprazole (PROTONIX) 40 MG tablet Take 1 tablet (40 mg total) by mouth daily. 30 min before food 90 tablet 3  . QUEtiapine (SEROQUEL) 25 MG tablet Take 1.5 tablets (37.5 mg total) by mouth at bedtime. 135 tablet 0  . SUMAtriptan (IMITREX) 50 MG tablet Take 1 tablet (50 mg total) by mouth as needed for migraine. May repeat in 2 hours if headache persists or recurs. No more than 2x per week. No more than 200 mg total/24 hours 9 tablet 2  . terbinafine (LAMISIL) 250 MG tablet Take 1 tablet (250 mg total) by mouth daily. 90 tablet 0   No current facility-administered medications for this visit.     Musculoskeletal: Strength & Muscle Tone: UTA Gait & Station: normal Patient leans: N/A  Psychiatric Specialty Exam: Review of Systems  Neurological: Positive for tremors (Improved).  Psychiatric/Behavioral: Negative for agitation, behavioral problems, confusion, decreased concentration, dysphoric mood, hallucinations, self-injury, sleep disturbance and suicidal ideas. The patient is not nervous/anxious and is not hyperactive.   All other systems reviewed and are negative.   There were no vitals taken for this visit.There is no height or weight on file to calculate BMI.  General Appearance: Casual  Eye Contact:  Fair  Speech:  Clear and Coherent  Volume:  Normal  Mood:  Euthymic  Affect:  Congruent  Thought Process:  Goal Directed and Descriptions of Associations: Intact  Orientation:  Full (Time, Place, and Person)  Thought Content: Logical   Suicidal Thoughts:  No  Homicidal Thoughts:  No  Memory:  Immediate;   Fair Recent;   Fair Remote;   Fair  Judgement:  Fair  Insight:  Fair  Psychomotor Activity:  Normal  Concentration:  Concentration: Fair and Attention Span: Fair  Recall:  [employer] of Knowledge: Fair  Language: Fair  Akathisia:  No  Handed:  Right  AIMS (if indicated):  UTA  Assets:  Communication Skills Desire for Improvement Housing Social Support  ADL's:  Intact  Cognition: WNL  Sleep:  Fair   Screenings: GAD-7     Office Visit from 12/19/2019 in [facility] Visit from 01/24/2018 in [facility] Office Visit from 10/24/2016 in [facility] at [facility]  Total GAD-7 Score 16 14 14     PHQ2-9     Office Visit from 12/19/2019 in [facility] Visit from 02/21/2019 in [facility] Office Visit from 01/24/2018 in [facility] from 05/10/2016 in [facility] at [facility] from 11/25/2015 in [facility]  PHQ-2 Total Score 0 0 6 0 0  PHQ-9 Total Score 0 -- 19 -- --       Assessment and Plan: GRYFFIN ALTICE is a 44 year old male, lives in [place], employed, has a history of PTSD, IBS, PVC, Gilbert's syndrome was evaluated by telemedicine today.  Patient is biologically predisposed given his history of trauma.  Patient with psychosocial stressors of relationship struggles as well as his own medical problems.  Patient is currently making progress with regards to his anxiety and depression.  Plan as noted below.  Plan PTSD-stable Prozac as prescribed Continue CBT  GAD-improving Reduce Prozac to 40 mg p.o. daily due to side effects at 60 mg. Seroquel 25 to 37.5 mg p.o. nightly Klonopin 0.5 mg twice daily as needed for severe anxiety symptoms.  He has been limiting use and uses 1 to 2 tablets once every 10 days or so. I have reviewed [place] controlled substance database. Continue CBT as needed  Panic attacks-improving Prozac 40 mg p.o. daily-reduced dosage due to side effects Seroquel 37.5 mg p.o. nightly Klonopin as needed Continue CBT  Insomnia-stable Seroquel as prescribed  Follow-up in clinic in 4 to 5 weeks or sooner if needed.  I have spent atleast 20 minutes non face to face with patient today.  More than 50 % of the time was spent for  ordering medications and test ,psychoeducation and supportive psychotherapy and care coordination,as well as documenting clinical information in electronic health record. This note was generated in part or whole with voice recognition software. Voice recognition is usually quite accurate but there are transcription errors that can and very often do occur. I apologize for any typographical errors that were not detected and corrected.        Ursula Alert, MD 01/13/2020, 3:50 PM

## 2020-01-16 ENCOUNTER — Other Ambulatory Visit: Payer: Self-pay

## 2020-01-16 ENCOUNTER — Encounter: Payer: Self-pay | Admitting: Licensed Clinical Social Worker

## 2020-01-16 ENCOUNTER — Ambulatory Visit (INDEPENDENT_AMBULATORY_CARE_PROVIDER_SITE_OTHER): Payer: BC Managed Care – PPO | Admitting: Licensed Clinical Social Worker

## 2020-01-16 DIAGNOSIS — F4310 Post-traumatic stress disorder, unspecified: Secondary | ICD-10-CM

## 2020-01-16 NOTE — Progress Notes (Signed)
Patient Location: Home  Provider Location: Home Office   Virtual Visit via Video Note  I connected with [name] on 01/16/20 at 10:00 AM EDT by a video enabled telemedicine application and verified that I am speaking with the correct person using two identifiers.   I discussed the limitations of evaluation and management by telemedicine and the availability of in person appointments. The patient expressed understanding and agreed to proceed.  THERAPY PROGRESS NOTE  Session Time: 30 Minutes  Participation Level: Active  Behavioral Response: Well GroomedAlertAnxious  Type of Therapy: Individual Therapy  Treatment Goals addressed: Anger and Coping  Interventions: CBT  Summary: Sergio Garcia is a 44 y.o. male who presents with dx of PTSD. Pt reported that he had a difficult time last week. 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 Pt identified warning signs of anger and triggers. Pt reported he feels at times like he has no filter and will say whatever is on his mind. Pt acknowledged this could result in negative consequences at work and in his relationship.    Suicidal/Homicidal: No  Therapist Response: Therapist met with patient for follow up session. Therapist and patient discussed anger response and control techniques. Therapist assigned patient homework to utilize tools discussed to help reduce intensity of anger and manage anger more effectively. Pt was receptive.  Plan: Return again in 2 weeks.  Diagnosis: Axis I: Post Traumatic Stress Disorder    Axis II: N/A  Josephine Igo, LCSW, LCAS 01/16/2020

## 2020-01-20 ENCOUNTER — Ambulatory Visit: Payer: BC Managed Care – PPO | Admitting: Physician Assistant

## 2020-01-30 ENCOUNTER — Ambulatory Visit: Payer: BC Managed Care – PPO | Admitting: Licensed Clinical Social Worker

## 2020-01-30 ENCOUNTER — Other Ambulatory Visit: Payer: Self-pay

## 2020-01-31 ENCOUNTER — Telehealth: Payer: Self-pay

## 2020-01-31 NOTE — Telephone Encounter (Signed)
pt called states that he wanted to speak with you about increasing the seroquel dosage.

## 2020-02-02 NOTE — Telephone Encounter (Signed)
Dr, Charlcie Cradle pt

## 2020-02-03 NOTE — Telephone Encounter (Signed)
Attempted to contact patient to discuss his concern about Seroquel.  Left voicemail.

## 2020-02-13 ENCOUNTER — Telehealth: Payer: Self-pay | Admitting: Internal Medicine

## 2020-02-13 NOTE — Telephone Encounter (Signed)
If using albuterol inhaler more  Does pt want to add another inhaler daily to control breathing I.e symbicort?

## 2020-02-14 ENCOUNTER — Other Ambulatory Visit: Payer: Self-pay | Admitting: Cardiovascular Disease

## 2020-02-14 NOTE — Telephone Encounter (Signed)
Left message to return call 

## 2020-02-17 NOTE — Telephone Encounter (Signed)
Faxed to [facility] for response requested : Requested to close potential Gap in Therapy faxed on 02-17-20

## 2020-02-20 ENCOUNTER — Other Ambulatory Visit: Payer: Self-pay

## 2020-02-20 ENCOUNTER — Telehealth (INDEPENDENT_AMBULATORY_CARE_PROVIDER_SITE_OTHER): Payer: BC Managed Care – PPO | Admitting: Psychiatry

## 2020-02-20 ENCOUNTER — Encounter: Payer: Self-pay | Admitting: Psychiatry

## 2020-02-20 DIAGNOSIS — F5105 Insomnia due to other mental disorder: Secondary | ICD-10-CM

## 2020-02-20 DIAGNOSIS — F4310 Post-traumatic stress disorder, unspecified: Secondary | ICD-10-CM

## 2020-02-20 DIAGNOSIS — F410 Panic disorder [episodic paroxysmal anxiety] without agoraphobia: Secondary | ICD-10-CM | POA: Diagnosis not present

## 2020-02-20 DIAGNOSIS — F411 Generalized anxiety disorder: Secondary | ICD-10-CM | POA: Diagnosis not present

## 2020-02-20 MED ORDER — FLUOXETINE HCL 40 MG PO CAPS: 40.0000 mg | ORAL_CAPSULE | Freq: Every day | ORAL | 0 refills | Status: DC

## 2020-02-20 MED ORDER — QUETIAPINE FUMARATE 50 MG PO TABS: 50.0000 mg | ORAL_TABLET | Freq: Every day | ORAL | 1 refills | Status: DC
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
## 2020-02-21 NOTE — Telephone Encounter (Signed)
Spoke with the Patient. States he uses his inhaler every 2-3 weeks. States that if he starts to use this more he will call back in so another inhaler can be added.

## 2020-02-24 NOTE — Telephone Encounter (Signed)
Noted  Dr. TMS 

## 2020-03-05 ENCOUNTER — Other Ambulatory Visit: Payer: Self-pay | Admitting: Psychiatry

## 2020-03-05 DIAGNOSIS — F5105 Insomnia due to other mental disorder: Secondary | ICD-10-CM

## 2020-03-05 DIAGNOSIS — F4310 Post-traumatic stress disorder, unspecified: Secondary | ICD-10-CM

## 2020-03-24 ENCOUNTER — Encounter: Payer: Self-pay | Admitting: Psychiatry

## 2020-03-24 ENCOUNTER — Other Ambulatory Visit: Payer: Self-pay

## 2020-03-24 ENCOUNTER — Telehealth (INDEPENDENT_AMBULATORY_CARE_PROVIDER_SITE_OTHER): Payer: BC Managed Care – PPO | Admitting: Psychiatry

## 2020-03-24 DIAGNOSIS — F5105 Insomnia due to other mental disorder: Secondary | ICD-10-CM

## 2020-03-24 DIAGNOSIS — F4310 Post-traumatic stress disorder, unspecified: Secondary | ICD-10-CM

## 2020-03-24 DIAGNOSIS — F411 Generalized anxiety disorder: Secondary | ICD-10-CM | POA: Diagnosis not present

## 2020-03-24 DIAGNOSIS — F410 Panic disorder [episodic paroxysmal anxiety] without agoraphobia: Secondary | ICD-10-CM | POA: Diagnosis not present

## 2020-03-24 NOTE — Progress Notes (Signed)
Virtual Visit via Telephone Note  I connected with Sergio Garcia on 03/24/20 at  3:40 PM EDT by telephone and verified that I am speaking with the correct person using two identifiers.  Location Provider Location : [facility] Patient Location : Home  Participants: Patient , Provider    I discussed the limitations, risks, security and privacy concerns of performing an evaluation and management service by telephone and the availability of in person appointments. I also discussed with the patient that there may be a patient responsible charge related to this service. The patient expressed understanding and agreed to proceed.    I discussed the assessment and treatment plan with the patient. The patient was provided an opportunity to ask questions and all were answered. The patient agreed with the plan and demonstrated an understanding of the instructions.   The patient was advised to call back or seek an in-person evaluation if the symptoms worsen or if the condition fails to improve as anticipated. [place] MD OP Progress Note  03/25/2020 5:03 PM Sergio Garcia  MRN:  [id number]  Chief Complaint:  Chief Complaint    Follow-up     HPI: Sergio Garcia is a 44 year old panic attacks, PTSD, GAD, insomnia Gilbert's syndrome, elevated LFT was evaluated by phone today.  Patient today reports he is currently struggling with relationship struggles with his girlfriend.  Patient has not had CBT in a while and agrees to getting in touch with his therapist again.  Patient however reports so far he is coping okay.  He has not had any significant panic attacks.  He has not used any Klonopin at all since he last spoke to Probation officer.  He is compliant on his medications like the Prozac and the Seroquel.  The Seroquel higher dosage definitely helps.  He denies any suicidality, homicidality or perceptual disturbances.  Patient reports work is going well.  Patient denies any other concerns today.  Visit Diagnosis:     ICD-10-CM   1. PTSD (post-traumatic stress disorder)  F43.10   2. Generalized anxiety disorder  F41.1   3. 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 Anxiety   . Degenerative disc disease   . Depression   . Enteritis   . Gastritis   . GERD (gastroesophageal reflux disease)   . Gilbert's syndrome    hyperbilirubinemia  . IBS (irritable bowel syndrome)   . Inguinal hernia   . Insomnia   . Prostatitis 2008    Past Surgical History:  Procedure Laterality Date  . BACK SURGERY     2 ruptured discs  . CHOLECYSTECTOMY  01/31/2014   GB polyp, chronic cholecystitis   . ESOPHAGOGASTRODUODENOSCOPY  09/2004   gastritis acute and duodenitis  . ESOPHAGOGASTRODUODENOSCOPY  01-14-14   Dr Allen Norris  . EXPLORATORY LAPAROTOMY  Jan 2015   Dr Burt Knack  . LASIK    . RIGHT/LEFT HEART CATH AND CORONARY ANGIOGRAPHY N/A 10/18/2019   Procedure: RIGHT/LEFT HEART CATH AND CORONARY ANGIOGRAPHY;  Surgeon: Wellington Hampshire, MD;  Location: [facility] [facility];  Service: Cardiovascular;  Laterality: N/A;    Family Psychiatric History: Reviewed family psychiatric history from my progress note on 09/20/2018  Family History:  Family History  Problem Relation Age of Onset  . Colon polyps Mother   .  Cancer Mother        sinus cavity, on XRT  . Heart disease Father   . Heart attack Father        x 2  . Pancreatic cancer Maternal Grandfather   . Colon cancer Maternal Grandfather   . Diabetes Other        strong FH d both sides of family   . Heart Problems Paternal Grandfather   . Cancer Maternal Uncle   . Stroke Cousin        age 3/44  . Mental illness Neg Hx     Social History: Reviewed social history from my progress note on 09/20/2018 Social History   Socioeconomic History  . Marital status:  Divorced    Spouse name: Not on file  . Number of children: 2  . Years of education: Not on file  . Highest education level: High school graduate  Occupational History  . Occupation: Music therapist: [employer]  Tobacco Use  . Smoking status: Never Smoker  . Smokeless tobacco: Never Used  Vaping Use  . Vaping Use: Never used  Substance and Sexual Activity  . Alcohol use: Yes    Comment: occassional social  . Drug use: No  . Sexual activity: Yes  Other Topics Concern  . Not on file  Social History Narrative   Married.   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   Enjoys watching racing, Dealer, deer hunting.             Social Determinants of Health   Financial Resource Strain:   . Difficulty of Paying Living Expenses: Not on file  Food Insecurity:   . Worried About Charity fundraiser in the Last Year: Not on file  . Ran Out of Food in the Last Year: Not on file  Transportation Needs:   . Lack of Transportation (Medical): Not on file  . Lack of Transportation (Non-Medical): Not on file  Physical Activity:   . Days of Exercise per Week: Not on file  . Minutes of Exercise per Session: Not on file  Stress:   . Feeling of Stress : Not on file  Social Connections:   . Frequency of Communication with Friends and Family: Not on file  . Frequency of Social Gatherings with Friends and Family: Not on file  . Attends Religious Services: Not on file  . Active Member of Clubs or Organizations: Not on file  . Attends Archivist Meetings: Not on file  . Marital Status: Not on file    Allergies:  Allergies  Allergen Reactions  . Morphine And Related Anaphylaxis  . Morphine Nausea And Vomiting  . Tdap [Tetanus-Diphth-Acell Pertussis] Hives and Rash    Metabolic Disorder Labs: Lab Results  Component Value Date   HGBA1C 6.0 12/19/2019   No results found for: PROLACTIN Lab Results  Component Value Date   CHOL 183  12/19/2019   TRIG 90.0 12/19/2019   HDL 41.40 12/19/2019   CHOLHDL 4 12/19/2019   VLDL 18.0 12/19/2019   LDLCALC 124 (H) 12/19/2019   LDLCALC 108 (H) 01/24/2019   Lab Results  Component Value Date   TSH 1.400 01/24/2019   TSH 1.88 03/02/2018    Therapeutic Level Labs: No results found for: LITHIUM No results found for: VALPROATE No components found for:  CBMZ  Current Medications: Current Outpatient Medications  Medication Sig Dispense Refill  . albuterol (VENTOLIN  HFA) 108 (90 Base) MCG/ACT inhaler Inhale 1-2 puffs into the lungs every 6 (six) hours as needed for wheezing or shortness of breath. 18 g 11  . cetirizine-pseudoephedrine (ZYRTEC-D) 5-120 MG tablet Take 1 tablet by mouth 2 (two) times daily. 30 tablet 0  . clonazePAM (KLONOPIN) 0.5 MG tablet Take 1 tablet (0.5 mg total) by mouth daily as needed for anxiety. 30 tablet 1  . COVID-19 Specimen Collection KIT See admin instructions. for testing    . FLUoxetine (PROZAC) 40 MG capsule Take 1 capsule (40 mg total) by mouth daily. To be combined with 20 mg 90 capsule 0  . gentamicin cream (GARAMYCIN) 0.1 % Apply 1 application topically 2 (two) times daily. (Patient not taking: Reported on 01/03/2020) 15 g 1  . isosorbide mononitrate (IMDUR) 30 MG 24 hr tablet Take 1 tablet (30 mg total) by mouth daily. (Patient not taking: Reported on 01/03/2020) 90 tablet 3  . metoprolol succinate (TOPROL-XL) 25 MG 24 hr tablet TAKE 1 TABLET BY MOUTH EVERY DAY 90 tablet 0  . ondansetron (ZOFRAN) 4 MG tablet Take by mouth.    . pantoprazole (PROTONIX) 40 MG tablet Take 1 tablet (40 mg total) by mouth daily. 30 min before food 90 tablet 3  . QUEtiapine (SEROQUEL) 50 MG tablet TAKE 1 TABLET BY MOUTH EVERYDAY AT BEDTIME 90 tablet 1  . SUMAtriptan (IMITREX) 50 MG tablet Take 1 tablet (50 mg total) by mouth as needed for migraine. May repeat in 2 hours if headache persists or recurs. No more than 2x per week. No more than 200 mg total/24 hours 9 tablet 2   . terbinafine (LAMISIL) 250 MG tablet Take 1 tablet (250 mg total) by mouth daily. 90 tablet 0   No current facility-administered medications for this visit.     Musculoskeletal: Strength & Muscle Tone: UTA Gait & Station: UTA Patient leans: N/A  Psychiatric Specialty Exam: Review of Systems  Psychiatric/Behavioral: The patient is nervous/anxious.   All other systems reviewed and are negative.   There were no vitals taken for this visit.There is no height or weight on file to calculate BMI.  General Appearance: UTA  Eye Contact:  UTA  Speech:  Clear and Coherent  Volume:  Normal  Mood:  Anxious coping OK  Affect:  UTA  Thought Process:  Goal Directed and Descriptions of Associations: Intact  Orientation:  Full (Time, Place, and Person)  Thought Content: Logical   Suicidal Thoughts:  No  Homicidal Thoughts:  No  Memory:  Immediate;   Fair Recent;   Fair Remote;   Fair  Judgement:  Fair  Insight:  Fair  Psychomotor Activity:  UTA  Concentration:  Concentration: Fair and Attention Span: Fair  Recall:  [employer] of Knowledge: Fair  Language: Fair  Akathisia:  No  Handed:  Right  AIMS (if indicated): UTA  Assets:  Communication Skills Desire for Improvement Housing Social Support  ADL's:  Intact  Cognition: WNL  Sleep:  Fair   Screenings: Sergio Garcia     Office Visit from 12/19/2019 in [place] Office Visit from 01/24/2018 in [place] Office Visit from 10/24/2016 in [place] at [facility]  Total Sergio Garcia Score 16 14 14     PHQ2-9     Office Visit from 12/19/2019 in [facility] Office Visit from 02/21/2019 in [facility] Office Visit from 01/24/2018 in [place] Office Visit from 05/10/2016 in [place] at [facility]  Visit from 11/25/2015 in [place]  PHQ-2 Total Score 0 0 6 0 0  PHQ-9 Total Score 0 -- 19 -- --       Assessment and  Plan: Sergio Garcia is a 44 year old male, lives in [place], employed, has a history of PTSD, panic attacks, GAD, insomnia, was evaluated by phone today.  Patient is currently struggling with psychosocial stressors of relationship struggles.  Patient however is coping okay and will continue to benefit from medication and psychotherapy sessions.  Plan PTSD-improving Prozac as prescribed Seroquel 50 mg p.o. nightly Continue CBT with Ms. Zadie Rhine  GAD-following Prozac 40 mg p.o. daily Klonopin 0.5 mg p.o. twice daily as needed for anxiety attacks-he rarely uses it. Seroquel 50 mg p.o. nightly  Panic attacks-improving Prozac as prescribed Continue CBT  Follow-up in clinic in 6 weeks or sooner if needed.  I have spent atleast 20 minutes non face to face  with patient today. More than 50 % of the time was spent for preparing to see the patient ( e.g., review of test, records ), ordering medications and test ,psychoeducation and supportive psychotherapy and care coordination,as well as documenting clinical information in electronic health record. This note was generated in part or whole with voice recognition software. Voice recognition is usually quite accurate but there are transcription errors that can and very often do occur. I apologize for any typographical errors that were not detected and corrected.      Ursula Alert, MD 03/25/2020, 5:03 PM

## 2020-04-07 ENCOUNTER — Other Ambulatory Visit: Payer: Self-pay | Admitting: Psychiatry

## 2020-04-07 DIAGNOSIS — F4310 Post-traumatic stress disorder, unspecified: Secondary | ICD-10-CM

## 2020-04-07 DIAGNOSIS — F411 Generalized anxiety disorder: Secondary | ICD-10-CM

## 2020-04-07 DIAGNOSIS — F5105 Insomnia due to other mental disorder: Secondary | ICD-10-CM

## 2020-04-21 ENCOUNTER — Other Ambulatory Visit: Payer: Self-pay | Admitting: Psychiatry

## 2020-04-21 DIAGNOSIS — F411 Generalized anxiety disorder: Secondary | ICD-10-CM

## 2020-05-05 ENCOUNTER — Encounter: Payer: Self-pay | Admitting: Psychiatry

## 2020-05-05 ENCOUNTER — Ambulatory Visit (INDEPENDENT_AMBULATORY_CARE_PROVIDER_SITE_OTHER): Payer: BC Managed Care – PPO | Admitting: Licensed Clinical Social Worker

## 2020-05-05 ENCOUNTER — Other Ambulatory Visit: Payer: Self-pay

## 2020-05-05 ENCOUNTER — Encounter: Payer: Self-pay | Admitting: Licensed Clinical Social Worker

## 2020-05-05 ENCOUNTER — Telehealth (INDEPENDENT_AMBULATORY_CARE_PROVIDER_SITE_OTHER): Payer: BC Managed Care – PPO | Admitting: Psychiatry

## 2020-05-05 DIAGNOSIS — F5105 Insomnia due to other mental disorder: Secondary | ICD-10-CM | POA: Diagnosis not present

## 2020-05-05 DIAGNOSIS — F411 Generalized anxiety disorder: Secondary | ICD-10-CM

## 2020-05-05 DIAGNOSIS — F410 Panic disorder [episodic paroxysmal anxiety] without agoraphobia: Secondary | ICD-10-CM

## 2020-05-05 DIAGNOSIS — Z634 Disappearance and death of family member: Secondary | ICD-10-CM | POA: Diagnosis not present

## 2020-05-05 DIAGNOSIS — F4310 Post-traumatic stress disorder, unspecified: Secondary | ICD-10-CM | POA: Diagnosis not present

## 2020-05-05 MED ORDER — FLUOXETINE HCL 40 MG PO CAPS: 40.0000 mg | ORAL_CAPSULE | Freq: Every day | ORAL | 0 refills | Status: DC

## 2020-05-05 NOTE — Progress Notes (Signed)
Virtual Visit via Video Note  I connected with Sergio Garcia on 05/05/20 at  4:00 PM EST by a video enabled telemedicine application and verified that I am speaking with the correct person using two identifiers.  Location Provider Location : [facility] Patient Location : Home  Participants: Patient , Provider    I discussed the limitations of evaluation and management by telemedicine and the availability of in person appointments. The patient expressed understanding and agreed to proceed.   I discussed the assessment and treatment plan with the patient. The patient was provided an opportunity to ask questions and all were answered. The patient agreed with the plan and demonstrated an understanding of the instructions.   The patient was advised to call back or seek an in-person evaluation if the symptoms worsen or if the condition fails to improve as anticipated.   [facility] MD OP Progress Note  05/05/2020 4:23 PM Sergio Garcia  MRN:  [id number]  Chief Complaint:  Chief Complaint    Follow-up     HPI: Sergio Garcia is a 44 year old male, employed, lives in [place], has a history of PTSD, panic attacks, insomnia, Gilbert's syndrome, elevated LFTs was evaluated by telemedicine today.  Patient today reports he had some trouble at work recently with his Librarian, academic.  Patient reports it took 3 weeks for the problem to resolve and that took a toll on his emotional wellbeing.  He reports he was very anxious for 3 weeks to the point that he needed Klonopin 5 days a week.  He also could not sleep in spite of taking the Seroquel.  He however reports he had a sit down meeting with his supervisors and everything is currently better.  He has been sleeping better ever since.  He also reports he stopped using the Klonopin every day.  He is compliant on Prozac.  Denies side effects.  Patient denies any suicidality, homicidality or perceptual disturbances.  Patient continues to have relationship struggles  with his girlfriend who lives with him.  He is currently in CBT with Ms. Zadie Rhine and reports therapy sessions are beneficial.  Patient denies any other concerns today.  Visit Diagnosis:    ICD-10-CM   1. PTSD (post-traumatic stress disorder)  F43.10 FLUoxetine (PROZAC) 40 MG capsule  2. Generalized anxiety disorder  F41.1 FLUoxetine (PROZAC) 40 MG capsule  3. Panic attacks  F41.0   4. Insomnia due to mental condition  F51.05     Past Psychiatric History: I have reviewed past psychiatric history from my progress note on 09/20/2018  Past Medical History:  Past Medical History:  Diagnosis Date  . Anxiety   . Degenerative disc disease   . Depression   . Enteritis   . Gastritis   . GERD (gastroesophageal reflux disease)   . Gilbert's syndrome    hyperbilirubinemia  . IBS (irritable bowel syndrome)   . Inguinal hernia   . Insomnia   . Prostatitis 2008    Past Surgical History:  Procedure Laterality Date  . BACK SURGERY     2 ruptured discs  . CHOLECYSTECTOMY  01/31/2014   GB polyp, chronic cholecystitis   . ESOPHAGOGASTRODUODENOSCOPY  09/2004   gastritis acute and duodenitis  . ESOPHAGOGASTRODUODENOSCOPY  01-14-14   Dr Allen Norris  . EXPLORATORY LAPAROTOMY  Jan 2015   Dr Burt Knack  . LASIK    . RIGHT/LEFT HEART CATH AND CORONARY ANGIOGRAPHY N/A 10/18/2019   Procedure: RIGHT/LEFT HEART CATH AND CORONARY ANGIOGRAPHY;  Surgeon: Wellington Hampshire, MD;  Location: [facility];  Service: Cardiovascular;  Laterality: N/A;    Family Psychiatric History: I have reviewed family psychiatric history from my progress note on 09/20/2018  Family History:  Family History  Problem Relation Age of Onset  . Colon polyps Mother   . Cancer Mother        sinus cavity, on XRT  . Heart disease Father   . Heart attack Father        x 2  . Pancreatic cancer Maternal Grandfather   . Colon cancer Maternal Grandfather   . Diabetes Other        strong FH d both sides of family   . Heart  Problems Paternal Grandfather   . Cancer Maternal Uncle   . Stroke Cousin        age 37/44  . Mental illness Neg Hx     Social History: I have reviewed social history from my progress note on 09/20/2018 Social History   Socioeconomic History  . Marital status: Divorced    Spouse name: Not on file  . Number of children: 2  . Years of education: Not on file  . Highest education level: High school graduate  Occupational History  . Occupation: Music therapist: [employer]  Tobacco Use  . Smoking status: Never Smoker  . Smokeless tobacco: Never Used  Vaping Use  . Vaping Use: Never used  Substance and Sexual Activity  . Alcohol use: Yes    Comment: occassional social  . Drug use: No  . Sexual activity: Yes  Other Topics Concern  . Not on file  Social History Narrative   Married.   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   Enjoys watching racing, Dealer, deer hunting.             Social Determinants of Health   Financial Resource Strain: Not on file  Food Insecurity: Not on file  Transportation Needs: Not on file  Physical Activity: Not on file  Stress: Not on file  Social Connections: Not on file    Allergies:  Allergies  Allergen Reactions  . Morphine And Related Anaphylaxis  . Morphine Nausea And Vomiting  . Tdap [Tetanus-Diphth-Acell Pertussis] Hives and Rash    Metabolic Disorder Labs: Lab Results  Component Value Date   HGBA1C 6.0 12/19/2019   No results found for: PROLACTIN Lab Results  Component Value Date   CHOL 183 12/19/2019   TRIG 90.0 12/19/2019   HDL 41.40 12/19/2019   CHOLHDL 4 12/19/2019   VLDL 18.0 12/19/2019   LDLCALC 124 (H) 12/19/2019   LDLCALC 108 (H) 01/24/2019   Lab Results  Component Value Date   TSH 1.400 01/24/2019   TSH 1.88 03/02/2018    Therapeutic Level Labs: No results found for: LITHIUM No results found for: VALPROATE No components found for:  CBMZ  Current  Medications: Current Outpatient Medications  Medication Sig Dispense Refill  . albuterol (VENTOLIN HFA) 108 (90 Base) MCG/ACT inhaler Inhale 1-2 puffs into the lungs every 6 (six) hours as needed for wheezing or shortness of breath. 18 g 11  . cetirizine-pseudoephedrine (ZYRTEC-D) 5-120 MG tablet Take 1 tablet by mouth 2 (two) times daily. 30 tablet 0  . clonazePAM (KLONOPIN) 0.5 MG tablet TAKE 1 TABLET (0.5 MG TOTAL) BY MOUTH 2 TIMES DAILY AS NEEDED FOR ANXIETY. 60 tablet 1  . COVID-19 Specimen Collection KIT See admin instructions. for testing    .  FLUoxetine (PROZAC) 40 MG capsule Take 1 capsule (40 mg total) by mouth daily. 90 capsule 0  . gentamicin cream (GARAMYCIN) 0.1 % Apply 1 application topically 2 (two) times daily. (Patient not taking: Reported on 01/03/2020) 15 g 1  . isosorbide mononitrate (IMDUR) 30 MG 24 hr tablet Take 1 tablet (30 mg total) by mouth daily. (Patient not taking: Reported on 01/03/2020) 90 tablet 3  . metoprolol succinate (TOPROL-XL) 25 MG 24 hr tablet TAKE 1 TABLET BY MOUTH EVERY DAY 90 tablet 0  . ondansetron (ZOFRAN) 4 MG tablet Take by mouth.    . pantoprazole (PROTONIX) 40 MG tablet Take 1 tablet (40 mg total) by mouth daily. 30 min before food 90 tablet 3  . QUEtiapine (SEROQUEL) 50 MG tablet TAKE 1 TABLET BY MOUTH EVERYDAY AT BEDTIME 90 tablet 1  . SUMAtriptan (IMITREX) 50 MG tablet Take 1 tablet (50 mg total) by mouth as needed for migraine. May repeat in 2 hours if headache persists or recurs. No more than 2x per week. No more than 200 mg total/24 hours 9 tablet 2  . terbinafine (LAMISIL) 250 MG tablet Take 1 tablet (250 mg total) by mouth daily. 90 tablet 0   No current facility-administered medications for this visit.     Musculoskeletal: Strength & Muscle Tone: UTA Gait & Station: normal Patient leans: N/A  Psychiatric Specialty Exam: Review of Systems  Psychiatric/Behavioral: The patient is nervous/anxious.   All other systems reviewed and are  negative.   There were no vitals taken for this visit.There is no height or weight on file to calculate BMI.  General Appearance: Casual  Eye Contact:  Fair  Speech:  Clear and Coherent  Volume:  Normal  Mood:  Anxious improving  Affect:  Congruent  Thought Process:  Goal Directed and Descriptions of Associations: Intact  Orientation:  Full (Time, Place, and Person)  Thought Content: Logical   Suicidal Thoughts:  No  Homicidal Thoughts:  No  Memory:  Immediate;   Fair Recent;   Fair Remote;   Fair  Judgement:  Fair  Insight:  Fair  Psychomotor Activity:  Normal  Concentration:  Concentration: Fair and Attention Span: Fair  Recall:  [employer] of Knowledge: Fair  Language: Fair  Akathisia:  No  Handed:  Right  AIMS (if indicated): UTA  Assets:  Communication Skills Desire for Improvement Housing Social Support  ADL's:  Intact  Cognition: WNL  Sleep:  Fair   Screenings: GAD-7   Personnel officer Visit from 12/19/2019 in [facility] Office Visit from 01/24/2018 in [facility] Office Visit from 10/24/2016 in [facility]  Total GAD-7 Score 16 14 14     PHQ2-9   [facility] Office Visit from 12/19/2019 in [facility] Visit from 02/21/2019 in [facility] Office Visit from 01/24/2018 in [facility] from 05/10/2016 in [facility] at [facility] from 11/25/2015 in [facility]  PHQ-2 Total Score 0 0 6 0 0  PHQ-9 Total Score 0 -- 19 -- --       Assessment and Plan: ZYDEN SUMAN is a 44 year old male, lives in [place], employed, has a history of PTSD, panic attacks, GAD, insomnia was evaluated by telemedicine today.  Patient is currently making progress on the current medication regimen.  He does have psychosocial stressors of job related problems, relationship struggles.  Patient will continue to benefit from CBT.  Plan as noted below.  Plan PTSD-improving Prozac 40 mg p.o. daily Seroquel 50 mg p.o. nightly Continue CBT with Ms. Zadie Rhine  GAD-improving Prozac 40 mg p.o. daily Klonopin 0.5 mg p.o. twice daily as needed for severe anxiety attacks-he has been limiting use. Seroquel 50 mg p.o. nightly  Panic attacks-improving Prozac as prescribed Continue CBT  Follow-up in clinic in 4 to 6 weeks or sooner if needed.  I have spent atleast 20 minutes face to face by video with patient today. More than 50 % of the time was spent for preparing to see the patient ( e.g., review of test, records ), ordering medications and test ,psychoeducation and supportive psychotherapy and care coordination,as well as documenting clinical information in electronic health record. This note was generated in part or whole with voice recognition software. Voice recognition is usually quite accurate but there are transcription errors that can and very often do occur. I apologize for any typographical errors that were not detected and corrected.      Ursula Alert, MD 05/06/2020, 10:21 AM
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
## 2020-05-23 ENCOUNTER — Other Ambulatory Visit: Payer: Self-pay | Admitting: Psychiatry

## 2020-05-23 DIAGNOSIS — F5105 Insomnia due to other mental disorder: Secondary | ICD-10-CM

## 2020-05-23 DIAGNOSIS — F4310 Post-traumatic stress disorder, unspecified: Secondary | ICD-10-CM

## 2020-05-23 DIAGNOSIS — I82402 Acute embolism and thrombosis of unspecified deep veins of left lower extremity: Secondary | ICD-10-CM

## 2020-05-23 DIAGNOSIS — F411 Generalized anxiety disorder: Secondary | ICD-10-CM

## 2020-05-23 DIAGNOSIS — I2699 Other pulmonary embolism without acute cor pulmonale: Secondary | ICD-10-CM

## 2020-05-23 HISTORY — DX: Other pulmonary embolism without acute cor pulmonale: I26.99

## 2020-05-23 HISTORY — DX: Acute embolism and thrombosis of unspecified deep veins of left lower extremity: I82.402

## 2020-05-26 ENCOUNTER — Other Ambulatory Visit: Payer: Self-pay

## 2020-05-26 ENCOUNTER — Encounter: Payer: Self-pay | Admitting: Licensed Clinical Social Worker

## 2020-05-26 ENCOUNTER — Ambulatory Visit (INDEPENDENT_AMBULATORY_CARE_PROVIDER_SITE_OTHER): Payer: BC Managed Care – PPO | Admitting: Licensed Clinical Social Worker

## 2020-05-26 DIAGNOSIS — F410 Panic disorder [episodic paroxysmal anxiety] without agoraphobia: Secondary | ICD-10-CM

## 2020-05-26 DIAGNOSIS — F411 Generalized anxiety disorder: Secondary | ICD-10-CM

## 2020-05-26 DIAGNOSIS — F4310 Post-traumatic stress disorder, unspecified: Secondary | ICD-10-CM

## 2020-05-26 NOTE — Progress Notes (Signed)
Virtual Visit via Video Note  I connected with Sergio Garcia on 05/26/20 at 10:00 AM EST by a video enabled telemedicine application and verified that I am speaking with the correct person using two identifiers.  Participating Parties Patient Provider  Location: Patient: Home Provider: Home Office   I discussed the limitations of evaluation and management by telemedicine and the availability of in person appointments. The patient expressed understanding and agreed to proceed.  THERAPY PROGRESS NOTE  Session Time: 44 Minutes  Participation Level: Active  Behavioral Response: Casual and Well GroomedAlertEuthymic  Type of Therapy: Individual Therapy  Treatment Goals addressed: Anxiety and Coping  Interventions: CBT  Summary: Sergio Garcia is a 45 y.o. male who presents with PTSD and anxiety sxs. Pt reported everything went good during Christmas spending time with family. Pt reported he decided to end relationship with live-in girlfriend and she moved out right after Christmas. Pt reported he "caught myself drinking more" was irritable most days, and "dreaded coming home from work". Pt reported feeling sense of relief and has stopped drinking. Pt acknowledged "I was numbing myself for everything". Pt reported improvement in sleep with medication, however last night he ran out of medication and did not sleep well. Pt reported he has a refill ready at the pharmacy he is going to pick it up today. Pt reported one panic attack since last session while attending a funeral, however "I was able to distract myself" w/out use of Klonopin, which he left at home. Pt reported he is sticking to routine by going to the gym, working out with a friend, and is looking forward to returning to work after winter break. Pt reflected on PTSD sxs and memories of trauma.  Suicidal/Homicidal: No  Therapist Response: Therapist met with patient for follow up. Therapist and patient explored improvement in sxs,  reactions to stressors and coping skills utilized. Therapist provided psychoeducation on memories of trauma and intrusive thoughts. Pt was receptive. Therapist assigned patient reading material on these topics.  Plan: Return again in 2 weeks.  Diagnosis: Axis I: Generalized Anxiety Disorder, Post Traumatic Stress Disorder and Panic Attacks    Axis II: N/A  Josephine Igo, LCSW, LCAS 05/26/2020

## 2020-05-27 ENCOUNTER — Telehealth: Payer: Self-pay

## 2020-05-27 NOTE — Telephone Encounter (Signed)
Called Sergio Garcia to ask if he is ok to try symbicort w/ Albuterol. Sergio Garcia is agreeable to starting symbicort.   Sergio Garcia states that he has been experiencing sinus pressure for a week and scheduled for an appointment with Dr. French Ana on 06/03/2019 at 10am.

## 2020-06-02 ENCOUNTER — Encounter: Payer: Self-pay | Admitting: Internal Medicine

## 2020-06-02 ENCOUNTER — Other Ambulatory Visit: Payer: Self-pay

## 2020-06-02 ENCOUNTER — Telehealth (INDEPENDENT_AMBULATORY_CARE_PROVIDER_SITE_OTHER): Payer: BC Managed Care – PPO | Admitting: Internal Medicine

## 2020-06-02 VITALS — Ht 74.0 in | Wt 208.0 lb

## 2020-06-02 DIAGNOSIS — J0111 Acute recurrent frontal sinusitis: Secondary | ICD-10-CM

## 2020-06-02 MED ORDER — PREDNISONE 20 MG PO TABS: 40.0000 mg | ORAL_TABLET | Freq: Every day | ORAL | 0 refills | Status: DC

## 2020-06-02 MED ORDER — AMOXICILLIN-POT CLAVULANATE 875-125 MG PO TABS: 1.0000 | ORAL_TABLET | Freq: Two times a day (BID) | ORAL | 0 refills | Status: DC
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
## 2020-06-02 NOTE — Progress Notes (Signed)
Virtual Visit via Video Note  I connected with Sergio Garcia  on 06/02/20 at 10:30 AM EST by a video enabled telemedicine application and verified that I am speaking with the correct person using two identifiers.  Location patient: home, [place] Location provider:work or home office Persons participating in the virtual visit: patient, provider  I discussed the limitations of evaluation and management by telemedicine and the availability of in person appointments. The patient expressed understanding and agreed to proceed.   HPI: 1. 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 His job indicated cant go back until negative PCR daughters covid test negative  ROS: See pertinent positives and negatives per HPI.  Past Medical History:  Diagnosis Date  . Anxiety   . Degenerative disc disease   . Depression   . Enteritis   . Gastritis   . GERD (gastroesophageal reflux disease)   . Gilbert's syndrome    hyperbilirubinemia  . IBS (irritable bowel syndrome)   . Inguinal hernia   . Insomnia   . Prostatitis 2008    Past Surgical History:  Procedure Laterality Date  . BACK SURGERY     2 ruptured discs  . CHOLECYSTECTOMY  01/31/2014   GB polyp, chronic cholecystitis   . ESOPHAGOGASTRODUODENOSCOPY  09/2004   gastritis acute and duodenitis  . ESOPHAGOGASTRODUODENOSCOPY  01-14-14   Dr Allen Norris  . EXPLORATORY LAPAROTOMY  Jan 2015   Dr Burt Knack  . LASIK    . RIGHT/LEFT HEART CATH AND CORONARY ANGIOGRAPHY N/A 10/18/2019   Procedure: RIGHT/LEFT HEART CATH AND CORONARY ANGIOGRAPHY;  Surgeon: Wellington Hampshire, MD;  Location: [place] [facility];  Service:  Cardiovascular;  Laterality: N/A;     Current Outpatient Medications:  .  albuterol (VENTOLIN HFA) 108 (90 Base) MCG/ACT inhaler, Inhale 1-2 puffs into the lungs every 6 (six) hours as needed for wheezing or shortness of breath., Disp: 18 g, Rfl: 11 .  amoxicillin-clavulanate (AUGMENTIN) 875-125 MG tablet, Take 1 tablet by mouth 2 (two) times daily. Stop after 7 to 10 total days already had 5 day supply, Disp: 10 tablet, Rfl: 0 .  cetirizine-pseudoephedrine (ZYRTEC-D) 5-120 MG tablet, Take 1 tablet by mouth 2 (two) times daily., Disp: 30 tablet, Rfl: 0 .  clonazePAM (KLONOPIN) 0.5 MG tablet, TAKE 1 TABLET (0.5 MG TOTAL) BY MOUTH 2 TIMES DAILY AS NEEDED FOR ANXIETY., Disp: 60 tablet, Rfl: 1 .  FLUoxetine (PROZAC) 40 MG capsule, Take 1 capsule (40 mg total) by mouth daily., Disp: 90 capsule, Rfl: 0 .  ondansetron (ZOFRAN) 4 MG tablet, Take by mouth., Disp: , Rfl:  .  pantoprazole (PROTONIX) 40 MG tablet, Take 1 tablet (40 mg total) by mouth daily. 30 min before food, Disp: 90 tablet, Rfl: 3 .  predniSONE (DELTASONE) 20 MG tablet, Take 2 tablets (40 mg total) by mouth daily with breakfast. X 5-7 days, Disp: 14 tablet, Rfl: 0 .  QUEtiapine (SEROQUEL) 50 MG tablet, TAKE 1 TABLET BY MOUTH EVERYDAY AT BEDTIME, Disp: 90 tablet, Rfl: 1 .  SUMAtriptan (IMITREX) 50 MG tablet, Take 1 tablet (50 mg total) by mouth as needed for migraine. May repeat in 2 hours if  headache persists or recurs. No more than 2x per week. No more than 200 mg total/24 hours, Disp: 9 tablet, Rfl: 2 .  gentamicin cream (GARAMYCIN) 0.1 %, Apply 1 application topically 2 (two) times daily. (Patient not taking: No sig reported), Disp: 15 g, Rfl: 1  EXAM:  VITALS per patient if applicable:  GENERAL: alert, oriented, appears well and in no acute distress  HEENT: atraumatic, conjunttiva clear, no obvious abnormalities on inspection of external nose and ears  NECK: normal movements of the head and neck  LUNGS: on inspection no signs  of respiratory distress, breathing rate appears normal, no obvious gross SOB, gasping or wheezing  CV: no obvious cyanosis  MS: moves all visible extremities without noticeable abnormality  PSYCH/NEURO: pleasant and cooperative, no obvious depression or anxiety, speech and thought processing grossly intact  ASSESSMENT AND PLAN:  Discussed the following assessment and plan:  Acute recurrent frontal sinusitis - Plan: amoxicillin-clavulanate (AUGMENTIN) 875-125 MG tablet bid, predniSONE (DELTASONE) 40 MG tablet Total x 5-10 days can stool if feeling better  Prn afrin  Also on zyrtec D prn  Centrum mvt  Note for work  Pending covid PCR F/u ent prn   -we discussed possible serious and likely etiologies, options for evaluation and workup, limitations of telemedicine visit vs in person visit, treatment, treatment risks and precautions.   I discussed the assessment and treatment plan with the patient. The patient was provided an opportunity to ask questions and all were answered. The patient agreed with the plan and demonstrated an understanding of the instructions.    Time 20 min  Delorise Jackson, MD

## 2020-06-09 ENCOUNTER — Other Ambulatory Visit: Payer: Self-pay

## 2020-06-09 ENCOUNTER — Ambulatory Visit (INDEPENDENT_AMBULATORY_CARE_PROVIDER_SITE_OTHER): Payer: Self-pay | Admitting: Licensed Clinical Social Worker

## 2020-06-09 ENCOUNTER — Telehealth (INDEPENDENT_AMBULATORY_CARE_PROVIDER_SITE_OTHER): Payer: BC Managed Care – PPO | Admitting: Psychiatry

## 2020-06-09 ENCOUNTER — Encounter: Payer: Self-pay | Admitting: Licensed Clinical Social Worker

## 2020-06-09 DIAGNOSIS — Z5329 Procedure and treatment not carried out because of patient's decision for other reasons: Secondary | ICD-10-CM

## 2020-06-09 DIAGNOSIS — F410 Panic disorder [episodic paroxysmal anxiety] without agoraphobia: Secondary | ICD-10-CM

## 2020-06-09 DIAGNOSIS — Z91199 Patient's noncompliance with other medical treatment and regimen due to unspecified reason: Secondary | ICD-10-CM | POA: Insufficient documentation

## 2020-06-09 DIAGNOSIS — F411 Generalized anxiety disorder: Secondary | ICD-10-CM

## 2020-06-09 DIAGNOSIS — F4310 Post-traumatic stress disorder, unspecified: Secondary | ICD-10-CM

## 2020-06-09 NOTE — Progress Notes (Signed)
Virtual Visit via Video Note  I connected with Sergio Garcia on 06/09/20 at 10:00 AM EST by a video enabled telemedicine application and verified that I am speaking with the correct person using two identifiers.  Participating Parties Patient Provider  Location: Patient: Home Provider: Home Office   I discussed the limitations of evaluation and management by telemedicine and the availability of in person appointments. The patient expressed understanding and agreed to proceed.  THERAPY PROGRESS NOTE  Session Time: 20 Minutes  Participation Level: Active  Behavioral Response: Casual and Well GroomedAlertEuthymic  Type of Therapy: Individual Therapy  Treatment Goals addressed: Anger, Anxiety and Coping  Interventions: CBT  Summary: Sergio Garcia is a 45 y.o. male who presents with PTSD and anxiety sxs. Pt reported he is continuing to maintain progress with decrease in sxs due to engaging in healthy outlets. Pt reported that on days he is not working for [employer] he has been doing [employer] "to make money on the side" and is exercising at a [employer] Pt reported he is able to talk to a good friend who "knows what it is like" that he used to work with at the [employer]. Pt reported he has "talked things out" with his girlfriend and "trying to settle our differences". Pt confirmed that he received reading materials from last session, however has not had the chance to read them yet. Pt reported no other concerns at this time.  Suicidal/Homicidal: No  Therapist Response: Therapist met with patient for follow up. Therapist and patient explored engagement in prosocial and enjoyable activities that helps patient manage sxs. Therapist provided psychoeducation around importance of communication and setting boundaries to support patient relationships. Pt was receptive.  Plan: Return again in 2 weeks.  Diagnosis: Axis I: Generalized Anxiety Disorder, Post Traumatic  Stress Disorder and Panic Attacks    Axis II: N/A  Josephine Igo, LCSW, LCAS 06/09/2020

## 2020-06-09 NOTE — Progress Notes (Signed)
No response to call or text or video invite Attempted to leave VM, Unable to since VM box full.

## 2020-06-17 ENCOUNTER — Other Ambulatory Visit: Payer: BC Managed Care – PPO

## 2020-06-17 ENCOUNTER — Ambulatory Visit
Admission: RE | Admit: 2020-06-17 | Discharge: 2020-06-17 | Disposition: A | Discharge status: Home / Self Care | Payer: BC Managed Care – PPO | Source: Ambulatory Visit | Attending: Internal Medicine | Admitting: Internal Medicine

## 2020-06-17 ENCOUNTER — Encounter: Payer: Self-pay | Admitting: Internal Medicine

## 2020-06-17 ENCOUNTER — Telehealth (INDEPENDENT_AMBULATORY_CARE_PROVIDER_SITE_OTHER): Payer: BC Managed Care – PPO | Admitting: Internal Medicine

## 2020-06-17 ENCOUNTER — Other Ambulatory Visit: Payer: Self-pay

## 2020-06-17 ENCOUNTER — Other Ambulatory Visit
Admission: RE | Admit: 2020-06-17 | Discharge: 2020-06-17 | Disposition: A | Discharge status: Home / Self Care | Payer: BC Managed Care – PPO | Source: Ambulatory Visit | Attending: Internal Medicine | Admitting: Internal Medicine

## 2020-06-17 DIAGNOSIS — R0602 Shortness of breath: Secondary | ICD-10-CM | POA: Insufficient documentation

## 2020-06-17 DIAGNOSIS — K580 Irritable bowel syndrome with diarrhea: Secondary | ICD-10-CM

## 2020-06-17 DIAGNOSIS — R42 Dizziness and giddiness: Secondary | ICD-10-CM

## 2020-06-17 DIAGNOSIS — I824Y2 Acute embolism and thrombosis of unspecified deep veins of left proximal lower extremity: Secondary | ICD-10-CM

## 2020-06-17 DIAGNOSIS — R112 Nausea with vomiting, unspecified: Secondary | ICD-10-CM

## 2020-06-17 DIAGNOSIS — R7989 Other specified abnormal findings of blood chemistry: Secondary | ICD-10-CM

## 2020-06-17 DIAGNOSIS — J45990 Exercise induced bronchospasm: Secondary | ICD-10-CM

## 2020-06-17 DIAGNOSIS — I2699 Other pulmonary embolism without acute cor pulmonale: Secondary | ICD-10-CM

## 2020-06-17 DIAGNOSIS — F419 Anxiety disorder, unspecified: Secondary | ICD-10-CM

## 2020-06-17 DIAGNOSIS — R079 Chest pain, unspecified: Secondary | ICD-10-CM

## 2020-06-17 LAB — FIBRIN DERIVATIVES D-DIMER (ARMC ONLY): Fibrin derivatives D-dimer (ARMC): 633.75 ng/mL (FEU) — ABNORMAL HIGH (ref 0.00–499.00)

## 2020-06-17 MED ORDER — ALBUTEROL SULFATE HFA 108 (90 BASE) MCG/ACT IN AERS: 1.0000 | INHALATION_SPRAY | Freq: Four times a day (QID) | RESPIRATORY_TRACT | 11 refills | Status: DC | PRN

## 2020-06-17 MED ORDER — ONDANSETRON HCL 4 MG PO TABS: 4.0000 mg | ORAL_TABLET | Freq: Three times a day (TID) | ORAL | 0 refills | Status: DC | PRN

## 2020-06-17 NOTE — Progress Notes (Addendum)
Virtual Visit via Video Note  I connected with Sergio Garcia  on 06/17/20 at 10:50 AM EST by a video enabled telemedicine application and verified that I am speaking with the correct person using two identifiers.  Location patient:work Location provider:work or home office Persons participating in the virtual visit: patient, provider  I discussed the limitations of evaluation and management by telemedicine and the availability of in person appointments. The patient expressed understanding and agreed to proceed.   HPI: 1. Sinusitis resolved with augmentin he did a home test past weekend negative covid and also tested 1-1.5 weeks ago and negative  2. Nausea still persisting and sob with exertion with exercise 3-4 x per week at the gym (denies h/o asthma) not wearing a mask at the gym also Sunday at church had dizziness and felt lightheaded but did not pass out he reports did not eat breakfast this am but was singing and felt like this  Of note he does have IBS-D  ROS: See pertinent positives and negatives per HPI.  Past Medical History:  Diagnosis Date  . Anxiety   . Degenerative disc disease   . Depression   . Enteritis   . Gastritis   . GERD (gastroesophageal reflux disease)   . Gilbert's syndrome    hyperbilirubinemia  . IBS (irritable bowel syndrome)   . Inguinal hernia   . Insomnia   . Prostatitis 2008    Past Surgical History:  Procedure Laterality Date  . BACK SURGERY     2 ruptured discs  . CHOLECYSTECTOMY  01/31/2014   GB polyp, chronic cholecystitis   . ESOPHAGOGASTRODUODENOSCOPY  09/2004   gastritis acute and duodenitis  . ESOPHAGOGASTRODUODENOSCOPY  01-14-14   Dr Allen Norris  . EXPLORATORY LAPAROTOMY  Jan 2015   Dr Burt Knack  . LASIK    . RIGHT/LEFT HEART CATH AND CORONARY ANGIOGRAPHY N/A 10/18/2019   Procedure: RIGHT/LEFT HEART CATH AND CORONARY ANGIOGRAPHY;  Surgeon: Wellington Hampshire, MD;  Location: [place] [facility];  Service: Cardiovascular;  Laterality: N/A;      Current Outpatient Medications:  .  clonazePAM (KLONOPIN) 0.5 MG tablet, TAKE 1 TABLET (0.5 MG TOTAL) BY MOUTH 2 TIMES DAILY AS NEEDED FOR ANXIETY., Disp: 60 tablet, Rfl: 1 .  FLUoxetine (PROZAC) 40 MG capsule, Take 1 capsule (40 mg total) by mouth daily., Disp: 90 capsule, Rfl: 0 .  pantoprazole (PROTONIX) 40 MG tablet, Take 1 tablet (40 mg total) by mouth daily. 30 min before food, Disp: 90 tablet, Rfl: 3 .  QUEtiapine (SEROQUEL) 50 MG tablet, TAKE 1 TABLET BY MOUTH EVERYDAY AT BEDTIME, Disp: 90 tablet, Rfl: 1 .  SUMAtriptan (IMITREX) 50 MG tablet, Take 1 tablet (50 mg total) by mouth as needed for migraine. May repeat in 2 hours if headache persists or recurs. No more than 2x per week. No more than 200 mg total/24 hours, Disp: 9 tablet, Rfl: 2 .  albuterol (VENTOLIN HFA) 108 (90 Base) MCG/ACT inhaler, Inhale 1-2 puffs into the lungs every 6 (six) hours as needed for wheezing or shortness of breath., Disp: 18 g, Rfl: 11 .  ondansetron (ZOFRAN) 4 MG tablet, Take 1 tablet (4 mg total) by mouth every 8 (eight) hours as needed for nausea or vomiting., Disp: 40 tablet, Rfl: 0  EXAM:  VITALS per patient if applicable:  GENERAL: alert, oriented, appears well and in no acute distress  HEENT: atraumatic, conjunttiva clear, no obvious abnormalities on inspection of external nose and ears  NECK: normal movements of the head  and neck  LUNGS: on inspection no signs of respiratory distress, breathing rate appears normal, no obvious gross SOB, gasping or wheezing  CV: no obvious cyanosis  MS: moves all visible extremities without noticeable abnormality  PSYCH/NEURO: pleasant and cooperative, no obvious depression or anxiety, speech and thought processing grossly intact  ASSESSMENT AND PLAN:  Discussed the following assessment and plan:  Nausea and vomiting, intractability of vomiting not specified, unspecified vomiting type - Plan: ondansetron (ZOFRAN) 4 MG tablet, Novel Coronavirus, NAA  ([facility])  Exercise-induced asthma ? - Plan: albuterol (VENTOLIN HFA) 108 (90 Base) MCG/ACT inhaler SOB (shortness of breath) on exertion related exercise or anxiety-Results for JAISEN, WILTROUT (MRN [id number]) as of 06/17/2020 16:49  Ref. Range 06/17/2020 16:07  Fibrin derivatives D-dimer ([facility]) Latest Ref Range: 0.00 - 499.00 ng/mL (FEU) 633.75 (H)   Plan: D-Dimer + r/o PE  CTA chest stat  [facility]   Quantitative, DG Chest 2 View Consider pulm if continues  rec wear mask in public   Anxiety Gad 7 18 today  F/u therapy and psych  Irritable bowel syndrome with diarrhea  Dizziness Could have been due to no food/hypoglycemia  Stay hydrated and try not to skip meals Check d dimer r/o PE, CXR r/u lung infection and check covid as he is not wearing mask in public   -we discussed possible serious and likely etiologies, options for evaluation and workup, limitations of telemedicine visit vs in person visit, treatment, treatment risks and precautions.    I discussed the assessment and treatment plan with the patient. The patient was provided an opportunity to ask questions and all were answered. The patient agreed with the plan and demonstrated an understanding of the instructions.    Time spent 20 min Delorise Jackson, MD

## 2020-06-17 NOTE — Addendum Note (Signed)
Addended by: Orland Mustard on: 06/17/2020 04:43 PM   Modules accepted: Orders

## 2020-06-17 NOTE — Addendum Note (Signed)
Addended by: Orland Mustard on: 06/17/2020 06:30 PM   Modules accepted: Orders

## 2020-06-17 NOTE — Progress Notes (Signed)
Patient having cough, SOB, Dizziness, nausea.  No one around the Patient sick and he tested negative for COVID a week and a half ago.  Nausea and SOB ongoing for a week and a half.  Has not been using the albuterol inhaler lately.  States that anxiety has increased despite nothing happening to cause this. GAD = 18.

## 2020-06-17 NOTE — Patient Instructions (Addendum)
Purchase a pulse oximeter amazon make sure oxygen about 90% even with exercise use your albuterol inhaler before exercise  Probiotics align or renew or culturelle over the counter   As needed immodium over the counter for diarrhea 2-4 mg as needed  Make sure you dont skip meals and drink water 55-64 ounces daily   Dizziness Dizziness is a common problem. It is a feeling of unsteadiness or light-headedness. You may feel like you are about to faint. Dizziness can lead to injury if you stumble or fall. Anyone can become dizzy, but dizziness is more common in older adults. This condition can be caused by a number of things, including medicines, dehydration, or illness. Follow these instructions at home: Eating and drinking  Drink enough fluid to keep your urine clear or pale yellow. This helps to keep you from becoming dehydrated. Try to drink more clear fluids, such as water.  Do not drink alcohol.  Limit your caffeine intake if told to do so by your health care provider. Check ingredients and nutrition facts to see if a food or beverage contains caffeine.  Limit your salt (sodium) intake if told to do so by your health care provider. Check ingredients and nutrition facts to see if a food or beverage contains sodium. Activity  Avoid making quick movements. ? Rise slowly from chairs and steady yourself until you feel okay. ? In the morning, first sit up on the side of the bed. When you feel okay, stand slowly while you hold onto something until you know that your balance is fine.  If you need to stand in one place for a long time, move your legs often. Tighten and relax the muscles in your legs while you are standing.  Do not drive or use heavy machinery if you feel dizzy.  Avoid bending down if you feel dizzy. Place items in your home so that they are easy for you to reach without leaning over. Lifestyle  Do not use any products that contain nicotine or tobacco, such as cigarettes and  e-cigarettes. If you need help quitting, ask your health care provider.  Try to reduce your stress level by using methods such as yoga or meditation. Talk with your health care provider if you need help to manage your stress. General instructions  Watch your dizziness for any changes.  Take over-the-counter and prescription medicines only as told by your health care provider. Talk with your health care provider if you think that your dizziness is caused by a medicine that you are taking.  Tell a friend or a family member that you are feeling dizzy. If he or she notices any changes in your behavior, have this person call your health care provider.  Keep all follow-up visits as told by your health care provider. This is important. Contact a health care provider if:  Your dizziness does not go away.  Your dizziness or light-headedness gets worse.  You feel nauseous.  You have reduced hearing.  You have new symptoms.  You are unsteady on your feet or you feel like the room is spinning. Get help right away if:  You vomit or have diarrhea and are unable to eat or drink anything.  You have problems talking, walking, swallowing, or using your arms, hands, or legs.  You feel generally weak.  You are not thinking clearly or you have trouble forming sentences. It may take a friend or family member to notice this.  You have chest pain, abdominal pain, shortness  of breath, or sweating.  Your vision changes.  You have any bleeding.  You have a severe headache.  You have neck pain or a stiff neck.  You have a fever. These symptoms may represent a serious problem that is an emergency. Do not wait to see if the symptoms will go away. Get medical help right away. Call your local emergency services (911 in the [place] Do not drive yourself to the hospital. Summary  Dizziness is a feeling of unsteadiness or light-headedness. This condition can be caused by a number of things, including  medicines, dehydration, or illness.  Anyone can become dizzy, but dizziness is more common in older adults.  Drink enough fluid to keep your urine clear or pale yellow. Do not drink alcohol.  Avoid making quick movements if you feel dizzy. Monitor your dizziness for any changes. This information is not intended to replace advice given to you by your health care provider. Make sure you discuss any questions you have with your health care provider. Document Revised: 05/12/2017 Document Reviewed: 06/11/2016 Elsevier Patient Education  2021 [place].  Nausea, Adult Nausea is the feeling that you have an upset stomach or that you are about to vomit. Nausea on its own is not usually a serious concern, but it may be an early sign of a more serious medical problem. As nausea gets worse, it can lead to vomiting. If vomiting develops, or if you are not able to drink enough fluids, you are at risk of becoming dehydrated. Dehydration can make you tired and thirsty, cause you to have a dry mouth, and decrease how often you urinate. Older adults and people with other diseases or a weak disease-fighting system (immune system) are at higher risk for dehydration. The main goals of treating your nausea are:  To relieve your nausea.  To limit repeated nausea episodes.  To prevent vomiting and dehydration. Follow these instructions at home: Watch your symptoms for any changes. Tell your health care provider about them. Follow these instructions as told by your health care provider. Eating and drinking  Take an oral rehydration solution (ORS). This is a drink that is sold at pharmacies and retail stores.  Drink clear fluids slowly and in small amounts as you are able. Clear fluids include water, ice chips, low-calorie sports drinks, and fruit juice that has water added (diluted fruit juice).  Eat bland, easy-to-digest foods in small amounts as you are able. These foods include bananas, applesauce, rice,  lean meats, toast, and crackers.  Avoid drinking fluids that contain a lot of sugar or caffeine, such as energy drinks, sports drinks, and soda.  Avoid alcohol.  Avoid spicy or fatty foods.      General instructions  Take over-the-counter and prescription medicines only as told by your health care provider.  Rest at home while you recover.  Drink enough fluid to keep your urine pale yellow.  Breathe slowly and deeply when you feel nauseous.  Avoid smelling things that have strong odors.  Wash your hands often using soap and water. If soap and water are not available, use hand sanitizer.  Make sure that all people in your household wash their hands well and often.  Keep all follow-up visits as told by your health care provider. This is important. Contact a health care provider if:  Your nausea gets worse.  Your nausea does not go away after two days.  You vomit.  You cannot drink fluids without vomiting.  You have any of  the following: ? New symptoms. ? A fever. ? A headache. ? Muscle cramps. ? A rash. ? Pain while urinating.  You feel light-headed or dizzy. Get help right away if:  You have pain in your chest, neck, arm, or jaw.  You feel extremely weak or you faint.  You have vomit that is bright red or looks like coffee grounds.  You have bloody or black stools or stools that look like tar.  You have a severe headache, a stiff neck, or both.  You have severe pain, cramping, or bloating in your abdomen.  You have difficulty breathing or are breathing very quickly.  Your heart is beating very quickly.  Your skin feels cold and clammy.  You feel confused.  You have signs of dehydration, such as: ? Dark urine, very little urine, or no urine. ? Cracked lips. ? Dry mouth. ? Sunken eyes. ? Sleepiness. ? Weakness. These symptoms may represent a serious problem that is an emergency. Do not wait to see if the symptoms will go away. Get medical help  right away. Call your local emergency services (911 in the [place] Do not drive yourself to the hospital. Summary  Nausea is the feeling that you have an upset stomach or that you are about to vomit. Nausea on its own is not usually a serious concern, but it may be an early sign of a more serious medical problem.  If vomiting develops, or if you are not able to drink enough fluids, you are at risk of becoming dehydrated.  Follow recommendations for eating and drinking and take over-the-counter and prescription medicines only as told by your health care provider.  Contact a health care provider right away if your symptoms worsen or you have new symptoms.  Keep all follow-up visits as told by your health care provider. This is important. This information is not intended to replace advice given to you by your health care provider. Make sure you discuss any questions you have with your health care provider. Document Revised: 04/09/2019 Document Reviewed: 10/17/2017 Elsevier Patient Education  2021 [employer].

## 2020-06-18 ENCOUNTER — Observation Stay
Admission: EM | Admit: 2020-06-18 | Discharge: 2020-06-20 | Disposition: A | Discharge status: Home / Self Care | Payer: BC Managed Care – PPO | Attending: Family Medicine | Admitting: Family Medicine

## 2020-06-18 ENCOUNTER — Other Ambulatory Visit: Payer: Self-pay

## 2020-06-18 ENCOUNTER — Ambulatory Visit
Admission: RE | Admit: 2020-06-18 | Discharge: 2020-06-18 | Disposition: A | Discharge status: Home / Self Care | Payer: BC Managed Care – PPO | Source: Ambulatory Visit | Attending: Internal Medicine | Admitting: Internal Medicine

## 2020-06-18 ENCOUNTER — Emergency Department: Payer: BC Managed Care – PPO

## 2020-06-18 ENCOUNTER — Telehealth: Payer: Self-pay | Admitting: Internal Medicine

## 2020-06-18 DIAGNOSIS — F4310 Post-traumatic stress disorder, unspecified: Secondary | ICD-10-CM | POA: Diagnosis not present

## 2020-06-18 DIAGNOSIS — R030 Elevated blood-pressure reading, without diagnosis of hypertension: Secondary | ICD-10-CM

## 2020-06-18 DIAGNOSIS — Z79899 Other long term (current) drug therapy: Secondary | ICD-10-CM | POA: Diagnosis not present

## 2020-06-18 DIAGNOSIS — R079 Chest pain, unspecified: Secondary | ICD-10-CM

## 2020-06-18 DIAGNOSIS — Z20822 Contact with and (suspected) exposure to covid-19: Secondary | ICD-10-CM | POA: Diagnosis not present

## 2020-06-18 DIAGNOSIS — I2699 Other pulmonary embolism without acute cor pulmonale: Principal | ICD-10-CM | POA: Diagnosis present

## 2020-06-18 DIAGNOSIS — I2694 Multiple subsegmental pulmonary emboli without acute cor pulmonale: Secondary | ICD-10-CM | POA: Diagnosis not present

## 2020-06-18 DIAGNOSIS — R7989 Other specified abnormal findings of blood chemistry: Secondary | ICD-10-CM

## 2020-06-18 DIAGNOSIS — R0602 Shortness of breath: Secondary | ICD-10-CM | POA: Diagnosis present

## 2020-06-18 HISTORY — DX: Prediabetes: R73.03

## 2020-06-18 LAB — COMPREHENSIVE METABOLIC PANEL
ALT: 31 U/L (ref 0–44)
AST: 25 U/L (ref 15–41)
Albumin: 4.3 g/dL (ref 3.5–5.0)
Alkaline Phosphatase: 49 U/L (ref 38–126)
Anion gap: 12 (ref 5–15)
BUN: 14 mg/dL (ref 6–20)
CO2: 25 mmol/L (ref 22–32)
Calcium: 9.4 mg/dL (ref 8.9–10.3)
Chloride: 102 mmol/L (ref 98–111)
Creatinine, Ser: 1.03 mg/dL (ref 0.61–1.24)
GFR, Estimated: >60 mL/min (ref >60)
Glucose, Bld: 103 mg/dL — ABNORMAL HIGH (ref 70–99)
Potassium: 3.9 mmol/L (ref 3.5–5.1)
Sodium: 139 mmol/L (ref 135–145)
Total Bilirubin: 2 mg/dL — ABNORMAL HIGH (ref 0.3–1.2)
Total Protein: 7.7 g/dL (ref 6.5–8.1)

## 2020-06-18 LAB — TROPONIN I (HIGH SENSITIVITY)
Troponin I (High Sensitivity): 5 ng/L (ref <18)
Troponin I (High Sensitivity): 6 ng/L (ref <18)

## 2020-06-18 LAB — CBC WITH DIFFERENTIAL/PLATELET
Abs Immature Granulocytes: 0.02 10*3/uL (ref 0.00–0.07)
Basophils Absolute: 0 10*3/uL (ref 0.0–0.1)
Basophils Relative: 0 %
Eosinophils Absolute: 0.1 10*3/uL (ref 0.0–0.5)
Eosinophils Relative: 1 %
HCT: 45.9 % (ref 39.0–52.0)
Hemoglobin: 15.8 g/dL (ref 13.0–17.0)
Immature Granulocytes: 0 %
Lymphocytes Relative: 25 %
Lymphs Abs: 2 10*3/uL (ref 0.7–4.0)
MCH: 31 pg (ref 26.0–34.0)
MCHC: 34.4 g/dL (ref 30.0–36.0)
MCV: 90 fL (ref 80.0–100.0)
Monocytes Absolute: 0.6 10*3/uL (ref 0.1–1.0)
Monocytes Relative: 8 %
Neutro Abs: 5.3 10*3/uL (ref 1.7–7.7)
Neutrophils Relative %: 66 %
Platelets: 251 10*3/uL (ref 150–400)
RBC: 5.1 MIL/uL (ref 4.22–5.81)
RDW: 12.8 % (ref 11.5–15.5)
WBC: 8 10*3/uL (ref 4.0–10.5)
nRBC: 0 % (ref 0.0–0.2)

## 2020-06-18 LAB — PROTIME-INR
INR: 1.1 (ref 0.8–1.2)
INR: 1.1 (ref 0.8–1.2)
Prothrombin Time: 13.3 seconds (ref 11.4–15.2)
Prothrombin Time: 14 seconds (ref 11.4–15.2)

## 2020-06-18 LAB — APTT: aPTT: 29 seconds (ref 24–36)

## 2020-06-18 LAB — SARS CORONAVIRUS 2 BY RT PCR (HOSPITAL ORDER, PERFORMED IN ~~LOC~~ HOSPITAL LAB): SARS Coronavirus 2: NEGATIVE

## 2020-06-18 MED ORDER — QUETIAPINE FUMARATE 25 MG PO TABS: 50.0000 mg | ORAL_TABLET | Freq: Every day | ORAL | Status: DC

## 2020-06-18 MED ORDER — FLUOXETINE HCL 20 MG PO CAPS
40.0000 mg | ORAL_CAPSULE | Freq: Every day | ORAL | Status: DC
  Administered 2020-06-19 – 2020-06-20 (×2): 40 mg via ORAL
  Filled 2020-06-18 (×2): qty 2

## 2020-06-18 MED ORDER — HEPARIN BOLUS VIA INFUSION
6300.0000 [IU] | Freq: Once | INTRAVENOUS | Status: AC
  Administered 2020-06-18: 6300 [IU] via INTRAVENOUS
  Filled 2020-06-18: qty 6300

## 2020-06-18 MED ORDER — CLONAZEPAM 0.5 MG PO TABS: 0.5000 mg | ORAL_TABLET | Freq: Two times a day (BID) | ORAL | Status: DC | PRN

## 2020-06-18 MED ORDER — PANTOPRAZOLE SODIUM 40 MG PO TBEC
40.0000 mg | DELAYED_RELEASE_TABLET | Freq: Every day | ORAL | Status: DC
Start: 2020-06-19 — End: 2020-06-20
  Administered 2020-06-19 – 2020-06-20 (×2): 40 mg via ORAL
  Filled 2020-06-18 (×2): qty 1

## 2020-06-18 MED ORDER — ACETAMINOPHEN 325 MG PO TABS
650.0000 mg | ORAL_TABLET | Freq: Four times a day (QID) | ORAL | Status: DC | PRN
  Administered 2020-06-18 – 2020-06-19 (×2): 650 mg via ORAL
  Filled 2020-06-18 (×2): qty 2

## 2020-06-18 MED ORDER — IOHEXOL 350 MG/ML SOLN
100.0000 mL | Freq: Once | INTRAVENOUS | Status: AC | PRN
  Administered 2020-06-18: 100 mL via INTRAVENOUS

## 2020-06-18 MED ORDER — HEPARIN (PORCINE) 25000 UT/250ML-% IV SOLN
1400.0000 [IU]/h | INTRAVENOUS | Status: DC
  Administered 2020-06-18: 1400 [IU]/h via INTRAVENOUS
  Filled 2020-06-18 (×2): qty 250

## 2020-06-18 NOTE — Telephone Encounter (Signed)
See other Patient message encounter from 1/26

## 2020-06-18 NOTE — Telephone Encounter (Signed)
+  PE rec go to ED and patient complied   ?etiology PE covid test pending

## 2020-06-18 NOTE — Consult Note (Signed)
ANTICOAGULATION CONSULT NOTE - Follow Up Consult  Pharmacy Consult for heparin Indication: pulmonary embolus  Allergies  Allergen Reactions  . Morphine And Related Anaphylaxis  . Morphine Nausea And Vomiting  . Tdap [Tetanus-Diphth-Acell Pertussis] Hives and Rash    Patient Measurements: Height: 6\' 2"  (188 cm) Weight: 90.7 kg (200 lb) IBW/kg (Calculated) : 82.2 Heparin Dosing Weight: 90.7  Vital Signs: Temp: 98.8 F (37.1 C) (01/27 1511) Temp Source: Oral (01/27 1511) BP: 129/100 (01/27 1730) Pulse Rate: 63 (01/27 1730)  Labs: Recent Labs    06/18/20 1517  HGB 15.8  HCT 45.9  PLT 251  LABPROT 13.3  INR 1.1  CREATININE 1.03  TROPONINIHS 5    Estimated Creatinine Clearance: 108.6 mL/min (by C-G formula based on SCr of 1.03 mg/dL).   Medications:  No anticoagulants prior to admission per chart review   Assessment: 45 year old male presenting with shortness of breath. PMH includes GERD, Gilbert's Disease, and anxiety/PTSD. Pt taking testosterone prior to admission. Pharmacy consulted to dose heparin for PE.  1/27 CT chest: Acute segmental/subsegmental pulmonary emboli involving the left anteromedial and lateral segments of the left lower lobe. No evidence of right heart strain.  Baseline aPTT and PT/INR ordered, CBC WNL  Goal of Therapy:  Heparin level 0.3-0.7 units/ml Monitor platelets by anticoagulation protocol: Yes   Plan:  Give 6300 units bolus x 1 Start heparin infusion at 1400 units/hr Check anti-Xa level in 6 hours and daily while on heparin Continue to monitor H&H and platelets  Benn Moulder, PharmD Pharmacy Resident  06/18/2020 5:58 PM

## 2020-06-18 NOTE — ED Provider Notes (Signed)
MSE was initiated and I personally evaluated the patient and placed orders (if any) at  3:15 PM on June 18, 2020.  Sent due to positive CT for PE, has PE in left lung, sent by physician  Labs ordered, pt appears stable  The patient appears stable so that the remainder of the MSE may be completed by another provider.   Versie Starks, PA-C 06/18/20 1516    Merlyn Lot, MD 06/23/20 [phone]

## 2020-06-18 NOTE — Telephone Encounter (Signed)
STAT CT results available in Epic For your information

## 2020-06-18 NOTE — H&P (Signed)
History and Physical    SALAR MOLDEN FVC:[id number] DOB: 07-08-1975 DOA: 06/18/2020  PCP: [name], [name], MD  Patient coming from: [place] at bedside  I have personally briefly reviewed patient's old medical records in [facility]  Chief Complaint: Shortness of breath  HPI: Sergio BARRETTA is a 45 y.o. male with medical history significant for BS, PSVT, PTSD, Gilbert's syndrome,pre-diabetes and hyperlipidemia who presents with concerns of increasing shortness of breath.  For the past 2 weeks he has noticed intermittent left-sided sharp chest pain associated with shortness of breath with minimal exertion.  Then for the past 2 days he has noticed this even when laying down in bed with radiation of the chest pain to his back.  Also has some fluttering sensation.  He recently started going to the gym and thought perhaps that could be the cause.  He had a heart catheterization back in May for chest pain and shortness of breath that revealed normal coronary arteries. Presented to his PCP today and found to have elevated D-dimer and was sent for CTA chest revealing a subsegmental left-sided PE and was sent to ER for further evaluation. Patient denies any previous history of DVT or PE.  He recently started testosterone injections several weeks ago along with his workout but has since stopped with the onset of his chest pain.  No calf pain.  Denies any recent travels or prolonged immobility.  Denies use of tobacco products.   ED Course: He was mildly hypertensive with blood pressure of 144/100 and tachypneic but was stable on room air. His CBC was unremarkable. Troponin of 5 and 6. CMP with only mildly elevated bilirubin of 2 which is chronic for him. Covid test negative. Outpatient CTA chest revealed showed acute segmental/subsegmental PEs involving the left lower lobe. No evidence of right heart strain.   Review of Systems: Constitutional: No Weight Change, No Fever ENT/Mouth: No  sore throat, No Rhinorrhea Eyes: No Eye Pain, No Vision Changes Cardiovascular: No Chest Pain, no SOB, No PND, No Dyspnea on Exertion, No Orthopnea, No Claudication, No Edema, No Palpitations Respiratory: No Cough, No Sputum, No Wheezing, no Dyspnea  Gastrointestinal: No Nausea, No Vomiting, No Diarrhea, No Constipation, No Pain Genitourinary: no Urinary Incontinence, No Urgency, No Flank Pain Musculoskeletal: No Arthralgias, No Myalgias Skin: No Skin Lesions, No Pruritus, Neuro: no Weakness, No Numbness,  No Loss of Consciousness, No Syncope Psych: No Anxiety/Panic, No Depression, no decrease appetite Heme/Lymph: No Bruising, No Bleeding   Past Medical History:  Diagnosis Date  . Anxiety   . Degenerative disc disease   . Depression   . Enteritis   . Gastritis   . GERD (gastroesophageal reflux disease)   . Gilbert's syndrome    hyperbilirubinemia  . IBS (irritable bowel syndrome)   . Inguinal hernia   . Insomnia   . Prostatitis 2008    Past Surgical History:  Procedure Laterality Date  . BACK SURGERY     2 ruptured discs  . CHOLECYSTECTOMY  01/31/2014   GB polyp, chronic cholecystitis   . ESOPHAGOGASTRODUODENOSCOPY  09/2004   gastritis acute and duodenitis  . ESOPHAGOGASTRODUODENOSCOPY  01-14-14   Dr Allen Norris  . EXPLORATORY LAPAROTOMY  Jan 2015   Dr Burt Knack  . LASIK    . RIGHT/LEFT HEART CATH AND CORONARY ANGIOGRAPHY N/A 10/18/2019   Procedure: RIGHT/LEFT HEART CATH AND CORONARY ANGIOGRAPHY;  Surgeon: Wellington Hampshire, MD;  Location: [facility];  Service: Cardiovascular;  Laterality: N/A;  reports that he has never smoked. He has never used smokeless tobacco. He reports current alcohol use. He reports that he does not use drugs. Social History  Allergies  Allergen Reactions  . Morphine And Related Anaphylaxis  . Morphine Nausea And Vomiting  . Tdap [Tetanus-Diphth-Acell Pertussis] Hives and Rash    Family History  Problem Relation Age of Onset  . Colon  polyps Mother   . Cancer Mother        sinus cavity, on XRT  . Asthma Mother   . Heart disease Father   . Heart attack Father        x 2  . Pancreatic cancer Maternal Grandfather   . Colon cancer Maternal Grandfather   . Diabetes Other        strong FH d both sides of family   . Heart Problems Paternal Grandfather   . Asthma Brother   . Cancer Maternal Uncle   . Stroke Cousin        age 79/44  . Mental illness Neg Hx      Prior to Admission medications   Medication Sig Start Date End Date Taking? Authorizing Provider  albuterol (VENTOLIN HFA) 108 (90 Base) MCG/ACT inhaler Inhale 1-2 puffs into the lungs every 6 (six) hours as needed for wheezing or shortness of breath. 06/17/20   [name], [name], MD  clonazePAM (KLONOPIN) 0.5 MG tablet TAKE 1 TABLET (0.5 MG TOTAL) BY MOUTH 2 TIMES DAILY AS NEEDED FOR ANXIETY. 04/21/20   Ursula Alert, MD  FLUoxetine (PROZAC) 40 MG capsule Take 1 capsule (40 mg total) by mouth daily. 05/05/20   Ursula Alert, MD  ondansetron (ZOFRAN) 4 MG tablet Take 1 tablet (4 mg total) by mouth every 8 (eight) hours as needed for nausea or vomiting. 06/17/20   [name], [name], MD  pantoprazole (PROTONIX) 40 MG tablet Take 1 tablet (40 mg total) by mouth daily. 30 min before food 12/19/19   [name], [name], MD  QUEtiapine (SEROQUEL) 50 MG tablet TAKE 1 TABLET BY MOUTH EVERYDAY AT BEDTIME 03/05/20   Ursula Alert, MD  SUMAtriptan (IMITREX) 50 MG tablet Take 1 tablet (50 mg total) by mouth as needed for migraine. May repeat in 2 hours if headache persists or recurs. No more than 2x per week. No more than 200 mg total/24 hours 02/21/19   [name], [name], MD    Physical Exam: Vitals:   06/18/20 1512 06/18/20 1727 06/18/20 1730 06/18/20 1800  BP:  (!) 135/107 (!) 129/100 (!) 147/94  Pulse:   63 68  Resp:  17 (!) 24 (!) 27  Temp:      TempSrc:      SpO2:   98% 96%  Weight: 90.7 kg     Height: 6\' 2"  (1.88 m)        Constitutional: NAD, calm, comfortable, middle-aged male sitting upright in bed Vitals:   06/18/20 1512 06/18/20 1727 06/18/20 1730 06/18/20 1800  BP:  (!) 135/107 (!) 129/100 (!) 147/94  Pulse:   63 68  Resp:  17 (!) 24 (!) 27  Temp:      TempSrc:      SpO2:   98% 96%  Weight: 90.7 kg     Height: 6\' 2"  (1.88 m)      Eyes: PERRL, lids and conjunctivae normal ENMT: Mucous membranes are moist. Normal dentition.  Neck: normal, supple Respiratory: clear to auscultation bilaterally, no wheezing, no crackles. Normal respiratory effort. No accessory muscle use.  Cardiovascular: Regular  rate and rhythm, no murmurs / rubs / gallops. No extremity edema. 2+ pedal pulses. Abdomen: no tenderness, no masses palpated.  Bowel sounds positive.  Musculoskeletal: no clubbing / cyanosis. No joint deformity upper and lower extremities. Good ROM, no contractures. Normal muscle tone.  Skin: no rashes, lesions, ulcers. No induration Neurologic: CN 2-12 grossly intact. Sensation intact, Strength 5/5 in all 4.  Psychiatric: Normal judgment and insight. Alert and oriented x 3. Normal mood.     Labs on Admission: I have personally reviewed following labs and imaging studies  CBC: Recent Labs  Lab 06/18/20 1517  WBC 8.0  NEUTROABS 5.3  HGB 15.8  HCT 45.9  MCV 90.0  PLT 123XX123   Basic Metabolic Panel: Recent Labs  Lab 06/18/20 1517  NA 139  K 3.9  CL 102  CO2 25  GLUCOSE 103*  BUN 14  CREATININE 1.03  CALCIUM 9.4   GFR: Estimated Creatinine Clearance: 108.6 mL/min (by C-G formula based on SCr of 1.03 mg/dL). Liver Function Tests: Recent Labs  Lab 06/18/20 1517  AST 25  ALT 31  ALKPHOS 49  BILITOT 2.0*  PROT 7.7  ALBUMIN 4.3   No results for input(s): LIPASE, AMYLASE in the last 168 hours. No results for input(s): AMMONIA in the last 168 hours. Coagulation Profile: Recent Labs  Lab 06/18/20 1517  INR 1.1   Cardiac Enzymes: No results for input(s): CKTOTAL, CKMB,  CKMBINDEX, TROPONINI in the last 168 hours. BNP (last 3 results) No results for input(s): PROBNP in the last 8760 hours. HbA1C: No results for input(s): HGBA1C in the last 72 hours. CBG: No results for input(s): GLUCAP in the last 168 hours. Lipid Profile: No results for input(s): CHOL, HDL, LDLCALC, TRIG, CHOLHDL, LDLDIRECT in the last 72 hours. Thyroid Function Tests: No results for input(s): TSH, T4TOTAL, FREET4, T3FREE, THYROIDAB in the last 72 hours. Anemia Panel: No results for input(s): VITAMINB12, FOLATE, FERRITIN, TIBC, IRON, RETICCTPCT in the last 72 hours. Urine analysis:    Component Value Date/Time   COLORURINE COLORLESS (A) 03/03/2017 1810   APPEARANCEUR Clear 03/02/2018 0830   LABSPEC 1.001 (L) 03/03/2017 1810   LABSPEC 1.012 01/13/2014 0634   PHURINE 7.0 03/03/2017 1810   GLUCOSEU Negative 03/02/2018 0830   GLUCOSEU Negative 01/13/2014 0634   HGBUR NEGATIVE 03/03/2017 1810   BILIRUBINUR Negative 03/02/2018 0830   BILIRUBINUR Negative 01/13/2014 [place] 03/03/2017 1810   PROTEINUR Negative 03/02/2018 0830   PROTEINUR NEGATIVE 03/03/2017 1810   UROBILINOGEN 0.2 10/06/2016 1044   NITRITE Negative 03/02/2018 0830   NITRITE NEGATIVE 03/03/2017 1810   LEUKOCYTESUR Negative 03/02/2018 0830   LEUKOCYTESUR Negative 01/13/2014 0634    Radiological Exams on Admission: DG Chest 2 View  Result Date: 06/17/2020 CLINICAL DATA:  Shortness of breath on exertion. Cough and headache for 1 week. Negative COVID test 2 weeks ago. EXAM: CHEST - 2 VIEW COMPARISON:  12/18/2012 FINDINGS: Normal heart size and pulmonary vascularity. No focal airspace disease or consolidation in the lungs. No blunting of costophrenic angles. No pneumothorax. Mediastinal contours appear intact. IMPRESSION: No active cardiopulmonary disease. Electronically Signed   By: Lucienne Capers M.D.   On: 06/17/2020 21:46   CT Angio Chest W/Cm &/Or Wo Cm  Result Date: 06/18/2020 CLINICAL DATA:   Left chest pain and shortness of breath x2 weeks. Positive D-dimer EXAM: CT ANGIOGRAPHY CHEST WITH CONTRAST TECHNIQUE: Multidetector CT imaging of the chest was performed using the standard protocol during bolus administration of intravenous contrast. Multiplanar CT  image reconstructions and MIPs were obtained to evaluate the vascular anatomy. CONTRAST:  110mL OMNIPAQUE IOHEXOL 350 MG/ML SOLN COMPARISON:  Chest radiograph June 17, 2020 and CTA chest December 26, 2012 FINDINGS: Cardiovascular: Satisfactory opacification of the pulmonary arteries to the segmental level. There are segmental/subsegmental pulmonary artery filling defects involving the left anteromedial and lateral segments of the left lower lobe (series 11, image 56). No evidence of right heart strain. Normal heart size. No pericardial effusion. Mediastinum/Nodes: No enlarged mediastinal, hilar, or axillary lymph nodes. Thyroid gland, trachea, and esophagus demonstrate no significant findings. Lungs/Pleura: Hypoventilatory change in the lower lobes. No pleural effusion. No pneumothorax. Upper Abdomen: Cholecystectomy closed. No suspicious hepatic lesions visualized. Adrenal glands are unremarkable. Spleen and pancreas appear unremarkable. Musculoskeletal: No chest wall abnormality. No acute or significant osseous findings. Review of the MIP images confirms the above findings. IMPRESSION: 1. Acute segmental/subsegmental pulmonary emboli involving the left anteromedial and lateral segments of the left lower lobe. No evidence of right heart strain. 2. Hypoventilatory change in the lower lobes. No focal consolidation. These results will be called to the ordering clinician or representative by the Radiologist Assistant, and communication documented in the PACS or [employer]. Electronically Signed   By: Dahlia Bailiff MD   On: 06/18/2020 14:14      Assessment/Plan Mr. Boehringer is a 45 year old male with recent testosterone use now found to have an  subsegmental PE without heart strain  Acute pulmonary embolism No evidence of heart right heart strain Suspect provoked due to testosterone use Continuous IV heparin infusion with conversion to oral anticoagulation in the morning DVT lower extremity ultrasound pending  Elevated blood pressure Could be secondary to new PE and chest pain Continue to monitor and assess for any needs to add antihypertensive  PTSD Continue Prozac and Seroquel  DVT prophylaxis: Heparin infusion Code Status: Full Family Communication: Plan discussed with patient at bedside  disposition Plan: Home with observation Consults called:  Admission status: Observation  Level of care: Progressive Cardiac  Status is: Observation  The patient remains OBS appropriate and will d/c before 2 midnights.  Dispo: The patient is from: Home              Anticipated d/c is to: Home              Anticipated d/c date is: 1 day              Patient currently is not medically stable to d/c.   Difficult to place patient No         Orene Desanctis DO [facility]   If 7PM-7AM, please contact night-coverage [email or web address]   06/18/2020, 6:27 PM

## 2020-06-18 NOTE — ED Provider Notes (Signed)
[facility] Emergency Department Provider Note   ____________________________________________   Event Date/Time   First MD Initiated Contact with Patient 06/18/20 1720     (approximate)  I have reviewed the triage vital signs and the nursing notes.   HISTORY  Chief Complaint Shortness of Breath    HPI Sergio Garcia is a 45 y.o. male with past medical history of GERD, Gilbert's disease, and anxiety/PTSD who presents to the ED complaining of shortness of breath.  Patient reports that for the past 5 days he has been getting much more out of breath than usual.  He states it can happen even at rest and he has intermittent episodes of sharp pain in the left side of his chest as well.  He denies any chest pain currently and breathing difficulties are mild while he is sitting in the stretcher.  He has been following with his PCP for this problem, initially had an elevated D-dimer and followed up for a CT of his chest today, which was positive for segmental/subsegmental pulmonary embolism on the left side.  He denies any history of DVT/PE, has not had any surgeries, chemotherapy, or recent long trips.  He does state that he has been taking testosterone recently for working out.        Past Medical History:  Diagnosis Date  . Anxiety   . Degenerative disc disease   . Depression   . Enteritis   . Gastritis   . GERD (gastroesophageal reflux disease)   . Gilbert's syndrome    hyperbilirubinemia  . IBS (irritable bowel syndrome)   . Inguinal hernia   . Insomnia   . Prostatitis 2008    Patient Active Problem List   Diagnosis Date Noted  . No-show for appointment 06/09/2020  . PSVT (paroxysmal supraventricular tachycardia) (HCC) 12/26/2019  . Bradycardia 12/26/2019  . Hyperlipidemia 12/19/2019  . Panic attacks 11/21/2019  . Mucous retention cyst of maxillary sinus 02/21/2019  . Weight loss, non-intentional 02/21/2019  . Generalized anxiety disorder  02/14/2019  . Prediabetes 01/29/2019  . PTSD (post-traumatic stress disorder) 01/23/2019  . 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 Elevated bilirubin 03/07/2018  . Palpitations 03/07/2018  . PVC's (premature ventricular contractions) 03/07/2018  . Back pain 10/06/2016  . Routine general medical examination at a health care facility 06/08/2015  . Exertional chest pain 10/07/2014  . Anxiety 01/23/2014  . Elevated LFTs 07/26/2013  . Chronic prostatitis 02/26/2013  . Orchalgia 02/26/2013  . Other procreative management counseling and advice 02/26/2013  . Shortness of breath 12/25/2012  . GERD (gastroesophageal reflux disease) 12/20/2012  . Chest pain 12/18/2012  . 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 Inguinal hernia, right 10/01/2009  . GILBERT'S SYNDROME 09/28/2009  . Irritable bowel syndrome with diarrhea 09/28/2009  . 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 ALLERGIC RHINITIS 08/30/2007    Past Surgical History:  Procedure Laterality Date  . BACK SURGERY     2 ruptured discs  . CHOLECYSTECTOMY  01/31/2014   GB polyp, chronic cholecystitis   . ESOPHAGOGASTRODUODENOSCOPY  09/2004   gastritis acute and duodenitis  . ESOPHAGOGASTRODUODENOSCOPY  01-14-14   Dr Servando SnareWohl  . EXPLORATORY LAPAROTOMY  Jan 2015   Dr Excell Seltzerooper  . LASIK    . RIGHT/LEFT HEART CATH AND CORONARY ANGIOGRAPHY N/A 10/18/2019   Procedure: RIGHT/LEFT HEART CATH AND CORONARY ANGIOGRAPHY;  Surgeon: Iran OuchArida, Muhammad A, MD;  Location: [facility];  Service: Cardiovascular;  Laterality: N/A;    Prior to  Admission medications   Medication Sig Start Date End Date Taking? Authorizing Provider  albuterol (VENTOLIN HFA) 108 (90 Base) MCG/ACT inhaler Inhale 1-2 puffs into the lungs every 6 (six) hours as needed for wheezing or shortness of breath. 06/17/20   [name], [name], MD  clonazePAM (KLONOPIN) 0.5 MG tablet TAKE 1 TABLET (0.5 MG TOTAL) BY MOUTH 2 TIMES DAILY AS NEEDED FOR  ANXIETY. 04/21/20   Ursula Alert, MD  FLUoxetine (PROZAC) 40 MG capsule Take 1 capsule (40 mg total) by mouth daily. 05/05/20   Ursula Alert, MD  ondansetron (ZOFRAN) 4 MG tablet Take 1 tablet (4 mg total) by mouth every 8 (eight) hours as needed for nausea or vomiting. 06/17/20   [name], [name], MD  pantoprazole (PROTONIX) 40 MG tablet Take 1 tablet (40 mg total) by mouth daily. 30 min before food 12/19/19   [name], [name], MD  QUEtiapine (SEROQUEL) 50 MG tablet TAKE 1 TABLET BY MOUTH EVERYDAY AT BEDTIME 03/05/20   Ursula Alert, MD  SUMAtriptan (IMITREX) 50 MG tablet Take 1 tablet (50 mg total) by mouth as needed for migraine. May repeat in 2 hours if headache persists or recurs. No more than 2x per week. No more than 200 mg total/24 hours 02/21/19   [name], [name], MD    Allergies Morphine and related, Morphine, and Tdap [tetanus-diphth-acell pertussis]  Family History  Problem Relation Age of Onset  . Colon polyps Mother   . Cancer Mother        sinus cavity, on XRT  . Asthma Mother   . Heart disease Father   . Heart attack Father        x 2  . Pancreatic cancer Maternal Grandfather   . Colon cancer Maternal Grandfather   . Diabetes Other        strong FH d both sides of family   . Heart Problems Paternal Grandfather   . Asthma Brother   . Cancer Maternal Uncle   . Stroke Cousin        age 72/44  . Mental illness Neg Hx     Social History Social History   Tobacco Use  . Smoking status: Never Smoker  . Smokeless tobacco: Never Used  Vaping Use  . Vaping Use: Never used  Substance Use Topics  . Alcohol use: Yes    Comment: occassional social  . Drug use: No    Review of Systems  Constitutional: No fever/chills Eyes: No visual changes. ENT: No sore throat. Cardiovascular: Positive for chest pain. Respiratory: Positive for shortness of breath. Gastrointestinal: No abdominal pain.  No nausea, no vomiting.  No diarrhea.  No  constipation. Genitourinary: Negative for dysuria. Musculoskeletal: Negative for back pain. Skin: Negative for rash. Neurological: Negative for headaches, focal weakness or numbness.  ____________________________________________   PHYSICAL EXAM:  VITAL SIGNS: ED Triage Vitals  Enc Vitals Group     BP 06/18/20 1511 (!) 144/100     Pulse Rate 06/18/20 1511 74     Resp 06/18/20 1511 20     Temp 06/18/20 1511 98.8 F (37.1 C)     Temp Source 06/18/20 1511 Oral     SpO2 06/18/20 1511 93 %     Weight 06/18/20 1512 200 lb (90.7 kg)     Height 06/18/20 1512 6\' 2"  (1.88 m)     Head Circumference --      Peak Flow --      Pain Score 06/18/20 1512 0     Pain  Loc --      Pain Edu? --      Excl. in [place]? --     Constitutional: Alert and oriented. Eyes: Conjunctivae are normal. Head: Atraumatic. Nose: No congestion/rhinnorhea. Mouth/Throat: Mucous membranes are moist. Neck: Normal ROM Cardiovascular: Normal rate, regular rhythm. Grossly normal heart sounds.  2+ radial pulses bilaterally. Respiratory: Normal respiratory effort.  No retractions. Lungs CTAB.  No chest wall tenderness to palpation. Gastrointestinal: Soft and nontender. No distention. Genitourinary: deferred Musculoskeletal: No lower extremity tenderness nor edema. Neurologic:  Normal speech and language. No gross focal neurologic deficits are appreciated. Skin:  Skin is warm, dry and intact. No rash noted. Psychiatric: Mood and affect are normal. Speech and behavior are normal.  ____________________________________________   LABS (all labs ordered are listed, but only abnormal results are displayed)  Labs Reviewed  COMPREHENSIVE METABOLIC PANEL - Abnormal; Notable for the following components:      Result Value   Glucose, Bld 103 (*)    Total Bilirubin 2.0 (*)    All other components within normal limits  SARS CORONAVIRUS 2 BY RT PCR (HOSPITAL ORDER, [place] [facility])  CBC WITH  DIFFERENTIAL/PLATELET  PROTIME-INR  APTT  PROTIME-INR  HEPARIN LEVEL (UNFRACTIONATED)  CBC  TROPONIN I (HIGH SENSITIVITY)  TROPONIN I (HIGH SENSITIVITY)   ____________________________________________  EKG  ED ECG REPORT I, Blake Divine, the attending physician, personally viewed and interpreted this ECG.   Date: 06/18/2020  EKG Time: 15:18  Rate: 72  Rhythm: normal sinus rhythm  Axis: Normal  Intervals:none  ST&T Change: nonspecific T wave changes   PROCEDURES  Procedure(s) performed (including Critical Care):  Procedures   ____________________________________________   INITIAL IMPRESSION / ASSESSMENT AND PLAN / ED COURSE       45 year old male with past medical history of GERD, Gilbert's syndrome, and anxiety/PTSD who presents to the ED with shortness of breath and intermittent chest pain for the past 5 days.  He was diagnosed with segmental/subsegmental PE based on outpatient CT scan.  There was no evidence of right heart strain on that CT scan, EKG and troponin here in the ED are also reassuring.  His only apparent risk factor for PE appears to be recent testosterone use.  We will start him on heparin and plan to discuss with hospitalist for admission.      ____________________________________________   FINAL CLINICAL IMPRESSION(S) / ED DIAGNOSES  Final diagnoses:  PE (pulmonary thromboembolism) [facility])     ED Discharge Orders    None       Note:  This document was prepared using Dragon voice recognition software and may include unintentional dictation errors.   Blake Divine, MD 06/18/20 [id number]

## 2020-06-18 NOTE — ED Triage Notes (Signed)
Pt here with SOB and pt has blood clot in his left lung. Pt went to his provider who did bloodwork that showed an elevated d-dimer. Pt denies having covid or being exposed to anyone. Pt in NAD in traige.

## 2020-06-18 NOTE — Telephone Encounter (Signed)
Pt asked about getting the kidney labs done. Please advise. Pt would like to have it done today when he gets his CT done at 1:15pm.

## 2020-06-19 ENCOUNTER — Encounter: Payer: Self-pay | Admitting: Family Medicine

## 2020-06-19 ENCOUNTER — Other Ambulatory Visit: Payer: Self-pay

## 2020-06-19 DIAGNOSIS — R030 Elevated blood-pressure reading, without diagnosis of hypertension: Secondary | ICD-10-CM | POA: Diagnosis not present

## 2020-06-19 DIAGNOSIS — F4310 Post-traumatic stress disorder, unspecified: Secondary | ICD-10-CM | POA: Diagnosis not present

## 2020-06-19 DIAGNOSIS — I2699 Other pulmonary embolism without acute cor pulmonale: Secondary | ICD-10-CM | POA: Diagnosis not present

## 2020-06-19 LAB — HEPARIN LEVEL (UNFRACTIONATED)
Heparin Unfractionated: 0.69 IU/mL (ref 0.30–0.70)
Heparin Unfractionated: 1.16 IU/mL — ABNORMAL HIGH (ref 0.30–0.70)

## 2020-06-19 LAB — ANTITHROMBIN III: AntiThromb III Func: 97 % (ref 75–120)

## 2020-06-19 LAB — BASIC METABOLIC PANEL
Anion gap: 8 (ref 5–15)
BUN: 17 mg/dL (ref 6–20)
CO2: 29 mmol/L (ref 22–32)
Calcium: 8.7 mg/dL — ABNORMAL LOW (ref 8.9–10.3)
Chloride: 104 mmol/L (ref 98–111)
Creatinine, Ser: 1.06 mg/dL (ref 0.61–1.24)
GFR, Estimated: >60 mL/min (ref >60)
Glucose, Bld: 93 mg/dL (ref 70–99)
Potassium: 3.7 mmol/L (ref 3.5–5.1)
Sodium: 141 mmol/L (ref 135–145)

## 2020-06-19 MED ORDER — APIXABAN 5 MG PO TABS
10.0000 mg | ORAL_TABLET | Freq: Two times a day (BID) | ORAL | Status: DC
  Administered 2020-06-19 – 2020-06-20 (×3): 10 mg via ORAL
  Filled 2020-06-19 (×3): qty 2

## 2020-06-19 MED ORDER — APIXABAN 5 MG PO TABS: 5.0000 mg | ORAL_TABLET | Freq: Two times a day (BID) | ORAL | Status: DC

## 2020-06-19 MED ORDER — HEPARIN (PORCINE) 25000 UT/250ML-% IV SOLN
1200.0000 [IU]/h | INTRAVENOUS | Status: DC
  Administered 2020-06-19: 1200 [IU]/h via INTRAVENOUS

## 2020-06-19 NOTE — ED Notes (Signed)
Wife at bedside. Pt sleeping on and off. Sent msg to attending per wife request re: can pt be transitioned to PO anticoagulation for discharge.

## 2020-06-19 NOTE — Consult Note (Signed)
ANTICOAGULATION CONSULT NOTE - Follow Up Consult  Pharmacy Consult for heparin Indication: pulmonary embolus  Allergies  Allergen Reactions  . Morphine And Related Anaphylaxis  . Morphine Nausea And Vomiting  . Tdap [Tetanus-Diphth-Acell Pertussis] Hives and Rash    Patient Measurements: Height: 6\' 2"  (188 cm) Weight: 90.7 kg (200 lb) IBW/kg (Calculated) : 82.2 Heparin Dosing Weight: 90.7  Vital Signs: Temp: 98.8 F (37.1 C) (01/27 1511) Temp Source: Oral (01/27 1511) BP: 116/90 (01/28 0136) Pulse Rate: 58 (01/28 0136)  Labs: Recent Labs    06/18/20 1517 06/18/20 1728 06/18/20 1820 06/19/20 0033  HGB 15.8  --   --   --   HCT 45.9  --   --   --   PLT 251  --   --   --   APTT  --   --  29  --   LABPROT 13.3  --  14.0  --   INR 1.1  --  1.1  --   HEPARINUNFRC  --   --   --  0.69  CREATININE 1.03  --   --   --   TROPONINIHS 5 6  --   --     Estimated Creatinine Clearance: 108.6 mL/min (by C-G formula based on SCr of 1.03 mg/dL).   Medications:  No anticoagulants prior to admission per chart review   Assessment: 45 year old male presenting with shortness of breath. PMH includes GERD, Gilbert's Disease, and anxiety/PTSD. Pt taking testosterone prior to admission. Pharmacy consulted to dose heparin for PE.  1/27 CT chest: Acute segmental/subsegmental pulmonary emboli involving the left anteromedial and lateral segments of the left lower lobe. No evidence of right heart strain.  Baseline aPTT and PT/INR ordered, CBC WNL  01/28 0033 HL = 0.69, therapeutic  Goal of Therapy:  Heparin level 0.3-0.7 units/ml Monitor platelets by anticoagulation protocol: Yes   Plan:  Continue heparin infusion at 1400 units/hr Recheck HL in 6 hours to confirm, then daily while on heparin Continue to monitor H&H and platelets  Renda Rolls, PharmD, [facility] 06/19/2020 2:01 AM

## 2020-06-19 NOTE — Consult Note (Signed)
ANTICOAGULATION CONSULT NOTE - Follow Up Consult  Pharmacy Consult for heparin Indication: pulmonary embolus  Allergies  Allergen Reactions  . Morphine And Related Anaphylaxis  . Morphine Nausea And Vomiting  . Tdap [Tetanus-Diphth-Acell Pertussis] Hives and Rash    Patient Measurements: Height: 6\' 2"  (188 cm) Weight: 90.7 kg (200 lb) IBW/kg (Calculated) : 82.2 Heparin Dosing Weight: 90.7 kg  Vital Signs: BP: 102/78 (01/28 0700) Pulse Rate: 51 (01/28 0700)  Labs: Recent Labs    06/18/20 1517 06/18/20 1728 06/18/20 1820 06/19/20 0033 06/19/20 0504 06/19/20 0716  HGB 15.8  --   --   --   --   --   HCT 45.9  --   --   --   --   --   PLT 251  --   --   --   --   --   APTT  --   --  29  --   --   --   LABPROT 13.3  --  14.0  --   --   --   INR 1.1  --  1.1  --   --   --   HEPARINUNFRC  --   --   --  0.69  --  1.16*  CREATININE 1.03  --   --   --  1.06  --   TROPONINIHS 5 6  --   --   --   --     Estimated Creatinine Clearance: 105.6 mL/min (by C-G formula based on SCr of 1.06 mg/dL).   Medications:  No anticoagulants prior to admission per chart review  Heparin Dosing Weight: 90.7 kg  Assessment: 45 year old male presenting with shortness of breath. PMH includes GERD, Gilbert's Disease, and anxiety/PTSD. Pt taking testosterone prior to admission. Pharmacy consulted to dose heparin for PE.  1/27 CT chest: Acute segmental/subsegmental pulmonary emboli involving the left anteromedial and lateral segments of the left lower lobe. No evidence of right heart strain.  Baseline aPTT and PT/INR ordered, CBC WNL  H/H & PLTs: WNL  Date Time HL Comment/rate 1/28 0033 0.69 Therapeutic 1/28 0716 1.16 Suprathera; RN confirmed correct draw  Goal of Therapy:  Heparin level 0.3-0.7 units/ml Monitor platelets by anticoagulation protocol: Yes   Plan:  Supratherapeutic and confirmed lab draw appropriate. Will hold infusion for 1 hr and resume at reduced rate of 1200  units/hr. Recheck HL in 6 hours to confirm. Continue to monitor H&H and platelets  Lorna Dibble, [facility] 06/19/2020 7:56 AM

## 2020-06-19 NOTE — ED Notes (Signed)
IV placed L forearm per pt request. Pt wants to be able to bend arm.

## 2020-06-19 NOTE — Progress Notes (Signed)
[place] [facility] [place] NAME: Sergio Garcia    MR#:  [id number]  DATE OF BIRTH:  1976-03-09  SUBJECTIVE:  CHIEF COMPLAINT:   Chief Complaint  Patient presents with  . Shortness of Breath  Feeling better.  Earlier was debating on wanting to go home but now agreeable to stay after discussion with him and his wife at bedside have gone over the findings of PE and also left lower extremity DVT and answered all their questions REVIEW OF SYSTEMS:  Review of Systems  Constitutional: Negative for diaphoresis, fever, malaise/fatigue and weight loss.  HENT: Negative for ear discharge, ear pain, hearing loss, nosebleeds, sore throat and tinnitus.   Eyes: Negative for blurred vision and pain.  Respiratory: Positive for shortness of breath. Negative for cough, hemoptysis and wheezing.   Cardiovascular: Negative for chest pain, palpitations, orthopnea and leg swelling.  Gastrointestinal: Negative for abdominal pain, blood in stool, constipation, diarrhea, heartburn, nausea and vomiting.  Genitourinary: Negative for dysuria, frequency and urgency.  Musculoskeletal: Negative for back pain and myalgias.  Skin: Negative for itching and rash.  Neurological: Negative for dizziness, tingling, tremors, focal weakness, seizures, weakness and headaches.  Psychiatric/Behavioral: Negative for depression. The patient is not nervous/anxious.    DRUG ALLERGIES:   Allergies  Allergen Reactions  . Morphine And Related Anaphylaxis  . Morphine Nausea And Vomiting  . Tdap [Tetanus-Diphth-Acell Pertussis] Hives and Rash   VITALS:  Blood pressure (!) 124/91, pulse 69, temperature 98.8 F (37.1 C), temperature source Oral, resp. rate 17, height 6\' 2"  (1.88 m), weight 90.7 kg, SpO2 98 %. PHYSICAL EXAMINATION:  Physical Exam HENT:     Head: Normocephalic and atraumatic.  Eyes:     Extraocular Movements: EOM normal.     Conjunctiva/sclera: Conjunctivae normal.     Pupils: Pupils are  equal, round, and reactive to light.  Neck:     Thyroid: No thyromegaly.     Trachea: No tracheal deviation.  Cardiovascular:     Rate and Rhythm: Normal rate and regular rhythm.     Heart sounds: Normal heart sounds.  Pulmonary:     Effort: Pulmonary effort is normal. No respiratory distress.     Breath sounds: Normal breath sounds. No wheezing.  Chest:     Chest wall: No tenderness.  Abdominal:     General: Bowel sounds are normal. There is no distension.     Palpations: Abdomen is soft.     Tenderness: There is no abdominal tenderness.  Musculoskeletal:        General: Normal range of motion.     Cervical back: Normal range of motion and neck supple.  Skin:    General: Skin is warm and dry.     Findings: No rash.  Neurological:     Mental Status: He is alert and oriented to person, place, and time.     Cranial Nerves: No cranial nerve deficit.    LABORATORY PANEL:  Male CBC Recent Labs  Lab 06/18/20 1517  WBC 8.0  HGB 15.8  HCT 45.9  PLT 251   ------------------------------------------------------------------------------------------------------------------ Chemistries  Recent Labs  Lab 06/18/20 1517 06/19/20 0504  NA 139 141  K 3.9 3.7  CL 102 104  CO2 25 29  GLUCOSE 103* 93  BUN 14 17  CREATININE 1.03 1.06  CALCIUM 9.4 8.7*  AST 25  --   ALT 31  --   ALKPHOS 49  --   BILITOT  2.0*  --    RADIOLOGY:  US Venous Img Lower Bilateral  Result Date: 06/18/2020 CLINICAL DATA:  PE EXAM: BILATERAL LOWER EXTREMITY VENOUS DOPPLER ULTRASOUND TECHNIQUE: Gray-scale sonography with graded compression, as well as color Doppler and duplex ultrasound were performed to evaluate the lower extremity deep venous systems from the level of the common femoral vein and including the common femoral, femoral, profunda femoral, popliteal and calf veins including the posterior tibial, peroneal and gastrocnemius veins when visible. The superficial great saphenous vein was also  interrogated. Spectral Doppler was utilized to evaluate flow at rest and with distal augmentation maneuvers in the common femoral, femoral and popliteal veins. COMPARISON:  None. FINDINGS: RIGHT LOWER EXTREMITY Common Femoral Vein: No evidence of thrombus. Normal compressibility, respiratory phasicity and response to augmentation. Saphenofemoral Junction: No evidence of thrombus. Normal compressibility and flow on color Doppler imaging. Profunda Femoral Vein: No evidence of thrombus. Normal compressibility and flow on color Doppler imaging. Femoral Vein: No evidence of thrombus. Normal compressibility, respiratory phasicity and response to augmentation. Popliteal Vein: No evidence of thrombus. Normal compressibility, respiratory phasicity and response to augmentation. Calf Veins: No evidence of thrombus. Normal compressibility and flow on color Doppler imaging. Superficial Great Saphenous Vein: No evidence of thrombus. Normal compressibility. Venous Reflux:  None. Other Findings:  None. LEFT LOWER EXTREMITY Common Femoral Vein: No evidence of thrombus. Normal compressibility, respiratory phasicity and response to augmentation. Saphenofemoral Junction: No evidence of thrombus. Normal compressibility and flow on color Doppler imaging. Profunda Femoral Vein: No evidence of thrombus. Normal compressibility and flow on color Doppler imaging. Femoral Vein: Partially occlusive thrombus is seen within the distal left femoral vein. Popliteal Vein: No evidence of thrombus. Normal compressibility, respiratory phasicity and response to augmentation. Calf Veins: No evidence of thrombus. Normal compressibility and flow on color Doppler imaging. Superficial Great Saphenous Vein: No evidence of thrombus. Normal compressibility. Venous Reflux:  None. Other Findings:  None. IMPRESSION: Partially occlusive thrombus within the distal left femoral vein. No DVT on the right. Electronically Signed   By: Rolm Baptise M.D.   On: 06/18/2020  18:56   ASSESSMENT AND PLAN:  Sergio Garcia is a 45 y.o. male with medical history significant for BS, PSVT, PTSD, Gilbert's syndrome,pre-diabetes and hyperlipidemia is admitted for new subsegment PE and partially occlusive thrombus within the distal left femoral vein.  Acute pulmonary embolism/DVT No evidence of heart right heart strain Suspect provoked due to testosterone use Currently on heparin drip.  We will transition him off heparin and start him on oral Eliquis per pharmacy protocol  lower extremity ultrasound shows partially occlusive thrombus in the distal left femoral vein - Patient's PCP reached out wanting to get hypercoagulable work-up while here-I have ordered same but unfortunately patient is already started on heparin before the blood will be drawn so it may or may not be very helpful I have conveyed this to patient/family and PCP  Elevated blood pressure Could be secondary to new PE and chest pain Continue to monitor and assess for any needs to add antihypertensive  PTSD Continue Prozac and Seroquel  Body mass index is 25.68 kg/m.  Net IO Since Admission: No IO data has been entered for this period [06/19/20 1419]      Status is: Observation  The patient remains OBS appropriate and will d/c before 2 midnights.  Dispo: The patient is from: Home              Anticipated d/c is to: Home  Anticipated d/c date is: 1 day              Patient currently is not medically stable to d/c.   Difficult to place patient No    DVT prophylaxis:        apixaban (ELIQUIS) tablet 10 mg  apixaban (ELIQUIS) tablet 5 mg     Family Communication: Wife updated at bedside   All the records are reviewed and case discussed with Care Management/Social Worker. Management plans discussed with the patient, family and they are in agreement.  CODE STATUS: Full Code Level of care: Progressive Cardiac  TOTAL TIME TAKING CARE OF THIS PATIENT: 35 minutes.   More than  50% of the time was spent in counseling/coordination of care: YES  POSSIBLE D/C IN 1 DAYS, DEPENDING ON CLINICAL CONDITION.   Max Sane M.D on 06/19/2020 at 2:19 PM  [facility]   CC: Primary care physician; [name], [name], MD  Note: This dictation was prepared with Dragon dictation along with smaller phrase technology. Any transcriptional errors that result from this process are unintentional.

## 2020-06-19 NOTE — ED Notes (Signed)
Pt states that attending was going to coordinate with social worker for temporary insurance approval of Eliquis. Wrote to attending to coordinate or find out which social worker need to talk to.

## 2020-06-19 NOTE — ED Notes (Signed)
Pharmacy called. Said to pause heparin drip for 1 hour then restart at new ordered rate.

## 2020-06-19 NOTE — ED Notes (Signed)
Spoke with pharmacist and Dr Manuella Ghazi. IV heparin to be discontinued and Eliquis started.

## 2020-06-19 NOTE — Consult Note (Signed)
[place] for Eliquis Indication: pulmonary embolus  Allergies  Allergen Reactions  . Morphine And Related Anaphylaxis  . Morphine Nausea And Vomiting  . Tdap [Tetanus-Diphth-Acell Pertussis] Hives and Rash   Labs: Recent Labs    06/18/20 1517 06/18/20 1728 06/18/20 1820 06/19/20 0033 06/19/20 0504 06/19/20 0716  HGB 15.8  --   --   --   --   --   HCT 45.9  --   --   --   --   --   PLT 251  --   --   --   --   --   APTT  --   --  29  --   --   --   LABPROT 13.3  --  14.0  --   --   --   INR 1.1  --  1.1  --   --   --   HEPARINUNFRC  --   --   --  0.69  --  1.16*  CREATININE 1.03  --   --   --  1.06  --   TROPONINIHS 5 6  --   --   --   --     Estimated Creatinine Clearance: 105.6 mL/min (by C-G formula based on SCr of 1.06 mg/dL).   Assessment: 45 year old male presenting with shortness of breath. PMH includes GERD, Gilbert's Disease, and anxiety/PTSD. Pt taking testosterone prior to admission. Pharmacy consulted to dose heparin for PE.  1/27 CT chest: Acute segmental/subsegmental pulmonary emboli involving the left anteromedial and lateral segments of the left lower lobe. No evidence of right heart strain.  Consult to transition to Eliquis.  Goal of Therapy:  Heparin level 0.3-0.7 units/ml Monitor platelets by anticoagulation protocol: Yes   Plan:  Eliquis 10 mg BID x 7 days followed by 5 mg BID. I met with the patient and his wife in the ED to provide medication education. Reviewed handout and left with wife. Patient and wife verbalized understanding.  Dorena Bodo, PharmD 06/19/2020 2:12 PM

## 2020-06-20 ENCOUNTER — Encounter: Payer: Self-pay | Admitting: Family Medicine

## 2020-06-20 DIAGNOSIS — I2699 Other pulmonary embolism without acute cor pulmonale: Secondary | ICD-10-CM

## 2020-06-20 LAB — CBC
HCT: 43.8 % (ref 39.0–52.0)
Hemoglobin: 14.6 g/dL (ref 13.0–17.0)
MCH: 30.7 pg (ref 26.0–34.0)
MCHC: 33.3 g/dL (ref 30.0–36.0)
MCV: 92.2 fL (ref 80.0–100.0)
Platelets: 233 K/uL (ref 150–400)
RBC: 4.75 MIL/uL (ref 4.22–5.81)
RDW: 12.9 % (ref 11.5–15.5)
WBC: 4.5 K/uL (ref 4.0–10.5)
nRBC: 0 % (ref 0.0–0.2)

## 2020-06-20 LAB — LUPUS ANTICOAGULANT PANEL
DRVVT: 31.8 s (ref 0.0–47.0)
PTT Lupus Anticoagulant: 49.9 s (ref 0.0–51.9)

## 2020-06-20 LAB — HIV ANTIBODY (ROUTINE TESTING W REFLEX): HIV Screen 4th Generation wRfx: NONREACTIVE

## 2020-06-20 LAB — PROTEIN S, TOTAL: Protein S Ag, Total: 88 % (ref 60–150)

## 2020-06-20 LAB — PROTEIN S ACTIVITY: Protein S Activity: 104 % (ref 63–140)

## 2020-06-20 LAB — PROTEIN C ACTIVITY: Protein C Activity: 135 % (ref 73–180)

## 2020-06-20 LAB — NOVEL CORONAVIRUS, NAA: SARS-CoV-2, NAA: NOT DETECTED

## 2020-06-20 MED ORDER — APIXABAN 5 MG PO TABS: 5.0000 mg | ORAL_TABLET | Freq: Two times a day (BID) | ORAL | 2 refills | Status: DC

## 2020-06-20 MED ORDER — APIXABAN 5 MG PO TABS: 10.0000 mg | ORAL_TABLET | Freq: Two times a day (BID) | ORAL | 0 refills | Status: DC

## 2020-06-20 NOTE — Discharge Summary (Signed)
Physician Discharge Summary  Sergio Garcia [id number] DOB: 10-Aug-1975 DOA: 06/18/2020  PCP: [name], [name], MD  Admit date: 06/18/2020.  Discharge date: 06/20/2020.  Admitted From: Home. Disposition:  Home  Recommendations for Outpatient Follow-up:  1. Follow up with PCP in 1-2 weeks 2. Please obtain BMP/CBC in one week 3.   Advised to take Eliquis 10 mg twice daily for 6 days(total 7 days) followed by Eliquis 5 mg twice daily for 6 to 9 months.  Home Health: None. Equipment/Devices: None.  Discharge Condition: Stable CODE STATUS:Full code Diet recommendation: Heart Healthy   Brief Summary : Sergio Garcia a 45 y.o.malewith medical history significant for Anxiety disorder,  PSVT, PTSD, Gilbert'ssyndrome, pre-diabetesand hyperlipidemia  presented in the emergency department with increasing shortness of breath.  Patient reports intermittent left-sided sharp chest pain for 2 days associated with shortness of breath with minimal exertion.  He recently started going to the gym and thoughts perhaps that could be the cause.  Patient was recently started on testosterone injections.  Patient went to see his primary care physician and was found to have elevated D-dimer and was sent for CTA chest which revealed subsegmental left-sided PE and referred to the ER for admission.  Hospital Course: Patient is admitted for acute pulmonary embolism, suspect provoked due to testosterone use.  Patient was started on heparin IV infusion which was subsequently transitioned to oral Eliquis.  DVT lower extremity positive for blood clot in the Left femoral vein. Patient seemed better,  ambulated well without any difficulty breathing, Echocardiogram:  no right heart strain.  Patient was given coupons for Eliquis for 1 month and patient is being discharged home.  Advised to take Eliquis 10 mg twice daily for total 7 days followed by 5 mg twice daily for 6 to 9 months.  He was managed for below  problems.   Discharge Diagnoses:  Active Problems:   PTSD (post-traumatic stress disorder)   Pulmonary embolism (HCC)   Elevated blood pressure reading  Acute pulmonary embolism/DVT No evidence of heart right heart strain. Suspect provoked due to testosterone use. Currently on heparin drip.    He was successfully transitioned to Eliquis. Lower extremity ultrasound shows partially occlusive thrombus in the distal left femoral vein Patient's PCP reached out wanting to get hypercoagulable work-up while here- We have ordered same but unfortunately patient is already started on heparin before the blood will be drawn so it may or may not be very helpful I have conveyed this to patient/family and PCP  Elevated blood pressure Could be secondary to new PE andchestpain. Continue to monitor and assess for any needs to add antihypertensive  PTSD Continue Prozac and Seroquel  Body mass index is 25.68 kg/m.  Discharge Instructions  Discharge Instructions    Call MD for:  difficulty breathing, headache or visual disturbances   Complete by: As directed    Call MD for:  persistant dizziness or light-headedness   Complete by: As directed    Call MD for:  persistant nausea and vomiting   Complete by: As directed    Call MD for:  temperature >100.4   Complete by: As directed    Diet - low sodium heart healthy   Complete by: As directed    Diet Carb Modified   Complete by: As directed    Discharge instructions   Complete by: As directed    Advised to follow-up with primary care physician in 1 week.   Advised to take Eliquis 10 mg twice  daily for 6 more days (Total 7 days) followed by  Eliquis 5 mg twice daily for next 6 to 9 months.   Increase activity slowly   Complete by: As directed      Allergies as of 06/20/2020      Reactions   Morphine And Related Anaphylaxis   Morphine Nausea And Vomiting   Tdap [tetanus-diphth-acell Pertussis] Hives, Rash      Medication List     TAKE these medications   albuterol 108 (90 Base) MCG/ACT inhaler Commonly known as: VENTOLIN HFA Inhale 1-2 puffs into the lungs every 6 (six) hours as needed for wheezing or shortness of breath.   apixaban 5 MG Tabs tablet Commonly known as: ELIQUIS Take 2 tablets (10 mg total) by mouth 2 (two) times daily.   apixaban 5 MG Tabs tablet Commonly known as: ELIQUIS Take 1 tablet (5 mg total) by mouth 2 (two) times daily. Start taking on: June 26, 2020   clonazePAM 0.5 MG tablet Commonly known as: KLONOPIN TAKE 1 TABLET (0.5 MG TOTAL) BY MOUTH 2 TIMES DAILY AS NEEDED FOR ANXIETY.   FLUoxetine 40 MG capsule Commonly known as: PROzac Take 1 capsule (40 mg total) by mouth daily.   ondansetron 4 MG tablet Commonly known as: ZOFRAN Take 1 tablet (4 mg total) by mouth every 8 (eight) hours as needed for nausea or vomiting.   pantoprazole 40 MG tablet Commonly known as: Protonix Take 1 tablet (40 mg total) by mouth daily. 30 min before food   QUEtiapine 50 MG tablet Commonly known as: SEROQUEL TAKE 1 TABLET BY MOUTH EVERYDAY AT BEDTIME   SUMAtriptan 50 MG tablet Commonly known as: IMITREX Take 1 tablet (50 mg total) by mouth as needed for migraine. May repeat in 2 hours if headache persists or recurs. No more than 2x per week. No more than 200 mg total/24 hours       Follow-up Information    [name], [name], MD Follow up in 1 week(s).   Specialty: Internal Medicine Contact information: [place] [address] [phone]        Wellington Hampshire, MD .   Specialty: Cardiology Contact information: [address] [place] [address] [phone]              Allergies  Allergen Reactions  . Morphine And Related Anaphylaxis  . Morphine Nausea And Vomiting  . Tdap [Tetanus-Diphth-Acell Pertussis] Hives and Rash    Consultations:  None   Procedures/Studies: DG Chest 2 View  Result Date: 06/17/2020 CLINICAL  DATA:  Shortness of breath on exertion. Cough and headache for 1 week. Negative COVID test 2 weeks ago. EXAM: CHEST - 2 VIEW COMPARISON:  12/18/2012 FINDINGS: Normal heart size and pulmonary vascularity. No focal airspace disease or consolidation in the lungs. No blunting of costophrenic angles. No pneumothorax. Mediastinal contours appear intact. IMPRESSION: No active cardiopulmonary disease. Electronically Signed   By: Lucienne Capers M.D.   On: 06/17/2020 21:46   CT Angio Chest W/Cm &/Or Wo Cm  Result Date: 06/18/2020 CLINICAL DATA:  Left chest pain and shortness of breath x2 weeks. Positive D-dimer EXAM: CT ANGIOGRAPHY CHEST WITH CONTRAST TECHNIQUE: Multidetector CT imaging of the chest was performed using the standard protocol during bolus administration of intravenous contrast. Multiplanar CT image reconstructions and MIPs were obtained to evaluate the vascular anatomy. CONTRAST:  185mL OMNIPAQUE IOHEXOL 350 MG/ML SOLN COMPARISON:  Chest radiograph June 17, 2020 and CTA chest December 26, 2012 FINDINGS:  Cardiovascular: Satisfactory opacification of the pulmonary arteries to the segmental level. There are segmental/subsegmental pulmonary artery filling defects involving the left anteromedial and lateral segments of the left lower lobe (series 11, image 56). No evidence of right heart strain. Normal heart size. No pericardial effusion. Mediastinum/Nodes: No enlarged mediastinal, hilar, or axillary lymph nodes. Thyroid gland, trachea, and esophagus demonstrate no significant findings. Lungs/Pleura: Hypoventilatory change in the lower lobes. No pleural effusion. No pneumothorax. Upper Abdomen: Cholecystectomy closed. No suspicious hepatic lesions visualized. Adrenal glands are unremarkable. Spleen and pancreas appear unremarkable. Musculoskeletal: No chest wall abnormality. No acute or significant osseous findings. Review of the MIP images confirms the above findings. IMPRESSION: 1. Acute  segmental/subsegmental pulmonary emboli involving the left anteromedial and lateral segments of the left lower lobe. No evidence of right heart strain. 2. Hypoventilatory change in the lower lobes. No focal consolidation. These results will be called to the ordering clinician or representative by the Radiologist Assistant, and communication documented in the PACS or [employer]. Electronically Signed   By: Dahlia Bailiff MD   On: 06/18/2020 14:14   US Venous Img Lower Bilateral  Result Date: 06/18/2020 CLINICAL DATA:  PE EXAM: BILATERAL LOWER EXTREMITY VENOUS DOPPLER ULTRASOUND TECHNIQUE: Gray-scale sonography with graded compression, as well as color Doppler and duplex ultrasound were performed to evaluate the lower extremity deep venous systems from the level of the common femoral vein and including the common femoral, femoral, profunda femoral, popliteal and calf veins including the posterior tibial, peroneal and gastrocnemius veins when visible. The superficial great saphenous vein was also interrogated. Spectral Doppler was utilized to evaluate flow at rest and with distal augmentation maneuvers in the common femoral, femoral and popliteal veins. COMPARISON:  None. FINDINGS: RIGHT LOWER EXTREMITY Common Femoral Vein: No evidence of thrombus. Normal compressibility, respiratory phasicity and response to augmentation. Saphenofemoral Junction: No evidence of thrombus. Normal compressibility and flow on color Doppler imaging. Profunda Femoral Vein: No evidence of thrombus. Normal compressibility and flow on color Doppler imaging. Femoral Vein: No evidence of thrombus. Normal compressibility, respiratory phasicity and response to augmentation. Popliteal Vein: No evidence of thrombus. Normal compressibility, respiratory phasicity and response to augmentation. Calf Veins: No evidence of thrombus. Normal compressibility and flow on color Doppler imaging. Superficial Great Saphenous Vein: No evidence of  thrombus. Normal compressibility. Venous Reflux:  None. Other Findings:  None. LEFT LOWER EXTREMITY Common Femoral Vein: No evidence of thrombus. Normal compressibility, respiratory phasicity and response to augmentation. Saphenofemoral Junction: No evidence of thrombus. Normal compressibility and flow on color Doppler imaging. Profunda Femoral Vein: No evidence of thrombus. Normal compressibility and flow on color Doppler imaging. Femoral Vein: Partially occlusive thrombus is seen within the distal left femoral vein. Popliteal Vein: No evidence of thrombus. Normal compressibility, respiratory phasicity and response to augmentation. Calf Veins: No evidence of thrombus. Normal compressibility and flow on color Doppler imaging. Superficial Great Saphenous Vein: No evidence of thrombus. Normal compressibility. Venous Reflux:  None. Other Findings:  None. IMPRESSION: Partially occlusive thrombus within the distal left femoral vein. No DVT on the right. Electronically Signed   By: Rolm Baptise M.D.   On: 06/18/2020 18:56    CTA chest.  Venous duplex   Subjective: Patient was seen and examined at bedside.  Overnight events noted.  Patient reports feeling much better,  Patient has ambulated in the hallway without any difficulty breathing.  Patient wants to be discharged home.  Discharge Exam: Vitals:   06/20/20 0556 06/20/20 0859  BP: 103/63 130/85  Pulse: (!) 49 (!) 58  Resp: 16 18  Temp: 97.7 F (36.5 C) 97.7 F (36.5 C)  SpO2: 97% 99%   Vitals:   06/19/20 2012 06/20/20 0019 06/20/20 0556 06/20/20 0859  BP: 124/81 97/65 103/63 130/85  Pulse: 72 (!) 54 (!) 49 (!) 58  Resp: 20 17 16 18   Temp:  (!) 97.5 F (36.4 C) 97.7 F (36.5 C) 97.7 F (36.5 C)  TempSrc:      SpO2: 96% 97% 97% 99%  Weight:      Height:        General: Pt is alert, awake, not in acute distress Cardiovascular: RRR, S1/S2 +, no rubs, no gallops Respiratory: CTA bilaterally, no wheezing, no rhonchi Abdominal: Soft, NT,  ND, bowel sounds + Extremities: no edema, no cyanosis    The results of significant diagnostics from this hospitalization (including imaging, microbiology, ancillary and laboratory) are listed below for reference.     Microbiology: Recent Results (from the past 240 hour(s))  Novel Coronavirus, NAA ([facility])     Status: None (Preliminary result)   Collection Time: 06/17/20  2:05 PM   Specimen: Nasal Swab; Nasopharyngeal(NP) swabs in vial transport medium   Nasopharynge  Result Value Ref Range Status   SARS-CoV-2, NAA Comment Not Detected Preliminary    Comment: Result being verified. Final report to follow.  SARS Coronavirus 2 by RT PCR (hospital order, performed in [facility] hospital lab) Nasopharyngeal Nasopharyngeal Swab     Status: None   Collection Time: 06/18/20  3:17 PM   Specimen: Nasopharyngeal Swab  Result Value Ref Range Status   SARS Coronavirus 2 NEGATIVE NEGATIVE Final    Comment: (NOTE) SARS-CoV-2 target nucleic acids are NOT DETECTED.  The SARS-CoV-2 RNA is generally detectable in upper and lower respiratory specimens during the acute phase of infection. The lowest concentration of SARS-CoV-2 viral copies this assay can detect is 250 copies / mL. A negative result does not preclude SARS-CoV-2 infection and should not be used as the sole basis for treatment or other patient management decisions.  A negative result may occur with improper specimen collection / handling, submission of specimen other than nasopharyngeal swab, presence of viral mutation(s) within the areas targeted by this assay, and inadequate number of viral copies (<250 copies / mL). A negative result must be combined with clinical observations, patient history, and epidemiological information.  Fact Sheet for Patients:   StrictlyIdeas.no  Fact Sheet for Healthcare Providers: BankingDealers.co.za  This test is not yet approved or  cleared by the  [place] FDA and has been authorized for detection and/or diagnosis of SARS-CoV-2 by FDA under an Emergency Use Authorization (EUA).  This EUA will remain in effect (meaning this test can be used) for the duration of the COVID-19 declaration under Section 564(b)(1) of the Act, 21 U.S.C. section 360bbb-3(b)(1), unless the authorization is terminated or revoked sooner.  Performed at [facility], [address] [place], [place] [address]      Labs: BNP (last 3 results) No results for input(s): BNP in the last 8760 hours. Basic Metabolic Panel: Recent Labs  Lab 06/18/20 1517 06/19/20 0504  NA 139 141  K 3.9 3.7  CL 102 104  CO2 25 29  GLUCOSE 103* 93  BUN 14 17  CREATININE 1.03 1.06  CALCIUM 9.4 8.7*   Liver Function Tests: Recent Labs  Lab 06/18/20 1517  AST 25  ALT 31  ALKPHOS 49  BILITOT 2.0*  PROT 7.7  ALBUMIN 4.3  No results for input(s): LIPASE, AMYLASE in the last 168 hours. No results for input(s): AMMONIA in the last 168 hours. CBC: Recent Labs  Lab 06/18/20 1517 06/20/20 0516  WBC 8.0 4.5  NEUTROABS 5.3  --   HGB 15.8 14.6  HCT 45.9 43.8  MCV 90.0 92.2  PLT 251 233   Cardiac Enzymes: No results for input(s): CKTOTAL, CKMB, CKMBINDEX, TROPONINI in the last 168 hours. BNP: Invalid input(s): POCBNP CBG: No results for input(s): GLUCAP in the last 168 hours. D-Dimer No results for input(s): DDIMER in the last 72 hours. Hgb A1c No results for input(s): HGBA1C in the last 72 hours. Lipid Profile No results for input(s): CHOL, HDL, LDLCALC, TRIG, CHOLHDL, LDLDIRECT in the last 72 hours. Thyroid function studies No results for input(s): TSH, T4TOTAL, T3FREE, THYROIDAB in the last 72 hours.  Invalid input(s): FREET3 Anemia work up No results for input(s): VITAMINB12, FOLATE, FERRITIN, TIBC, IRON, RETICCTPCT in the last 72 hours. Urinalysis    Component Value Date/Time   COLORURINE COLORLESS (A) 03/03/2017 1810   APPEARANCEUR  Clear 03/02/2018 0830   LABSPEC 1.001 (L) 03/03/2017 1810   LABSPEC 1.012 01/13/2014 0634   PHURINE 7.0 03/03/2017 1810   GLUCOSEU Negative 03/02/2018 0830   GLUCOSEU Negative 01/13/2014 0634   HGBUR NEGATIVE 03/03/2017 1810   BILIRUBINUR Negative 03/02/2018 0830   BILIRUBINUR Negative 01/13/2014 0634   KETONESUR NEGATIVE 03/03/2017 1810   PROTEINUR Negative 03/02/2018 0830   PROTEINUR NEGATIVE 03/03/2017 1810   UROBILINOGEN 0.2 10/06/2016 1044   NITRITE Negative 03/02/2018 0830   NITRITE NEGATIVE 03/03/2017 1810   LEUKOCYTESUR Negative 03/02/2018 0830   LEUKOCYTESUR Negative 01/13/2014 0634   Sepsis Labs Invalid input(s): PROCALCITONIN,  WBC,  LACTICIDVEN Microbiology Recent Results (from the past 240 hour(s))  Novel Coronavirus, NAA ([facility])     Status: None (Preliminary result)   Collection Time: 06/17/20  2:05 PM   Specimen: Nasal Swab; Nasopharyngeal(NP) swabs in vial transport medium   Nasopharynge  Result Value Ref Range Status   SARS-CoV-2, NAA Comment Not Detected Preliminary    Comment: Result being verified. Final report to follow.  SARS Coronavirus 2 by RT PCR (hospital order, performed in [facility] hospital lab) Nasopharyngeal Nasopharyngeal Swab     Status: None   Collection Time: 06/18/20  3:17 PM   Specimen: Nasopharyngeal Swab  Result Value Ref Range Status   SARS Coronavirus 2 NEGATIVE NEGATIVE Final    Comment: (NOTE) SARS-CoV-2 target nucleic acids are NOT DETECTED.  The SARS-CoV-2 RNA is generally detectable in upper and lower respiratory specimens during the acute phase of infection. The lowest concentration of SARS-CoV-2 viral copies this assay can detect is 250 copies / mL. A negative result does not preclude SARS-CoV-2 infection and should not be used as the sole basis for treatment or other patient management decisions.  A negative result may occur with improper specimen collection / handling, submission of specimen other than nasopharyngeal  swab, presence of viral mutation(s) within the areas targeted by this assay, and inadequate number of viral copies (<250 copies / mL). A negative result must be combined with clinical observations, patient history, and epidemiological information.  Fact Sheet for Patients:   StrictlyIdeas.no  Fact Sheet for Healthcare Providers: BankingDealers.co.za  This test is not yet approved or  cleared by the [place] FDA and has been authorized for detection and/or diagnosis of SARS-CoV-2 by FDA under an Emergency Use Authorization (EUA).  This EUA will remain in effect (meaning this test can be  used) for the duration of the COVID-19 declaration under Section 564(b)(1) of the Act, 21 U.S.C. section 360bbb-3(b)(1), unless the authorization is terminated or revoked sooner.  Performed at [facility], [address], [place], [place] [address]      Time coordinating discharge: Over 30 minutes  SIGNED:   Shawna Clamp, MD  [facility] 06/20/2020, 11:12 AM Pager   If 7PM-7AM, please contact night-coverage [email or web address]

## 2020-06-20 NOTE — Discharge Instructions (Signed)
Advised to take Eliquis 10 mg twice daily for 6 days(total 7 days) followed by Eliquis 5 mg twice daily for 6 to 9 months.

## 2020-06-21 LAB — BETA-2-GLYCOPROTEIN I ABS, IGG/M/A
Beta-2 Glyco I IgG: <9 GPI IgG units (ref 0–20)
Beta-2-Glycoprotein I IgA: <9 GPI IgA units (ref 0–25)
Beta-2-Glycoprotein I IgM: 19 GPI IgM units (ref 0–32)

## 2020-06-22 ENCOUNTER — Telehealth: Payer: Self-pay | Admitting: Internal Medicine

## 2020-06-22 ENCOUNTER — Encounter: Payer: Self-pay | Admitting: Internal Medicine

## 2020-06-22 DIAGNOSIS — Z86718 Personal history of other venous thrombosis and embolism: Secondary | ICD-10-CM | POA: Insufficient documentation

## 2020-06-22 DIAGNOSIS — I824Y2 Acute embolism and thrombosis of unspecified deep veins of left proximal lower extremity: Secondary | ICD-10-CM | POA: Insufficient documentation

## 2020-06-22 LAB — CARDIOLIPIN ANTIBODIES, IGG, IGM, IGA
Anticardiolipin IgA: <9 APL U/mL (ref 0–11)
Anticardiolipin IgG: <9 GPL U/mL (ref 0–14)
Anticardiolipin IgM: <9 MPL U/mL (ref 0–12)

## 2020-06-22 LAB — HOMOCYSTEINE: Homocysteine: 8.4 umol/L (ref 0.0–14.5)

## 2020-06-22 LAB — PROTEIN C, TOTAL: Protein C, Total: 113 % (ref 60–150)

## 2020-06-22 NOTE — Telephone Encounter (Signed)
**Note Sergio-Identified via Obfuscation** PA Denied:   The patient's drug benefit plan provides coverage for other drugs which may be considered for treating your patient. Can your patient's treatment be switched to a formulary drug? [If yes, then provide your patient with a new prescription for the preferred product.] Available Formulary Alternatives: Xarelto      Documentation     You  Sergio Garcia, RPH-CPP 2 hours ago (1:08 PM)   [name] thanks can you call him to discuss or if we have samples of eliquis sign out for him   Thank you   Message text    catie can you help and address   Thanks [place]

## 2020-06-22 NOTE — Telephone Encounter (Signed)
Spoke with CMA Pa for Eliquis denied patient must try Xarelto first per insurance if PCP thinks high risk for GI bleed from IBS we may can try this for appeal.

## 2020-06-22 NOTE — Telephone Encounter (Signed)
Thank you please call and explain this plan to him and have him call us back when out so can send in Xarelto or sch appt by time runs out so I may Rx xarelto please   Thank you

## 2020-06-22 NOTE — Telephone Encounter (Signed)
This was addressed during hospitalization, Patient was given 1 month coupon for Eliquis.  Pharmacist has confirmed with his pharmacy he has to pay $4 per month for Eliquis supply. Needs  Prior authorization

## 2020-06-22 NOTE — Telephone Encounter (Signed)
This was addressed during hospitalization, Patient was given 1 month coupon for Eliquis.  Pharmacist has confirmed with his pharmacy he has to pay $4 per month for Eliquis supply. Needs  Prior authorization   Ok thank you can we work on this Catie?

## 2020-06-22 NOTE — Telephone Encounter (Signed)
Please see recent Patient message encounter.  Pa denied

## 2020-06-22 NOTE — Addendum Note (Signed)
Addended by: Orland Mustard on: 06/22/2020 12:43 PM   Modules accepted: Orders

## 2020-06-22 NOTE — Telephone Encounter (Signed)
Routing to [place] for PA completion

## 2020-06-22 NOTE — Telephone Encounter (Signed)
PA Denied:   The patient's drug benefit plan provides coverage for other drugs which may be considered for treating your patient. Can your patient's treatment be switched to a formulary drug? [If yes, then provide your patient with a new prescription for the preferred product.] Available Formulary Alternatives: Xarelto

## 2020-06-22 NOTE — Telephone Encounter (Signed)
  Patient is messaging me after hospital discharge cost of Eliquis too expensive and not covered by insurance   Why was this not figured out during the admission via Education officer, museum or case management prior to discharge ?  This I think is best for all patients receiving anticoagulation esp NOACS because its a common theme    Who can help sort this out for him?    Please advise   Routing comment    [place], [place]  You 47 minutes ago (11:41 AM)      Hey Dr Aundra Dubin  When I was discharged from the hospital the doctor prescribed me Eliquis. He told me to check with my insurance to see if they cover that medicine. I called them and my insurance does not cover it without prior approval from my doctor. Can you get prior approval for me? I checked with [facility] and without the insurance it's over $500 a month for the medicine and I can't afford that.   Thank You Beckey Rutter

## 2020-06-22 NOTE — Telephone Encounter (Addendum)
I would then switch to Xarelto.   Per documentation, he should have received the coupon for a free 30 day supply of Eliquis which will allow him to complete the load and then transition to maintenance dosing.   So when he completes that supply, would start Xarelto 20 mg daily

## 2020-06-23 ENCOUNTER — Ambulatory Visit: Payer: BC Managed Care – PPO | Admitting: Internal Medicine

## 2020-06-23 ENCOUNTER — Encounter: Payer: Self-pay | Admitting: Licensed Clinical Social Worker

## 2020-06-23 ENCOUNTER — Ambulatory Visit (INDEPENDENT_AMBULATORY_CARE_PROVIDER_SITE_OTHER): Payer: Self-pay | Admitting: Licensed Clinical Social Worker

## 2020-06-23 ENCOUNTER — Other Ambulatory Visit: Payer: Self-pay

## 2020-06-23 ENCOUNTER — Telehealth: Payer: Self-pay

## 2020-06-23 DIAGNOSIS — F411 Generalized anxiety disorder: Secondary | ICD-10-CM

## 2020-06-23 DIAGNOSIS — R079 Chest pain, unspecified: Secondary | ICD-10-CM

## 2020-06-23 DIAGNOSIS — F4310 Post-traumatic stress disorder, unspecified: Secondary | ICD-10-CM

## 2020-06-23 NOTE — Progress Notes (Signed)
Cardiology Office Note    Date:  06/26/2020   ID:  Sergio Garcia, DOB 15-Feb-1976, MRN [id number]  PCP:  [name], [name], MD  Cardiologist:  Kathlyn Sacramento, MD  Electrophysiologist:  None   Chief Complaint: Hospital follow-up  History of Present Illness:   Sergio Garcia is a 45 y.o. male with history of normal coronary arteries by LHC on 10/18/2019,paroxysmal SVT, recently diagnosed DVT/PE in 05/2020, Gilbert'ssyndrome, IBS, GERD, strong family history of CAD, PTSD,anxiety, and depression who presents forhospital follow-up after recent admission for DVT/PE.  Remote treadmill stress test in 2019 performed at [facility] for atypical chest pain showed excellent exercise capacity, with the patient exercising for 11 minutes, and was negative for ischemia.  He was evaluated by Dr.Aridaas a new patient in 05/2018 for palpitations and shortness of breath with associated extreme fatigue and dizziness. At that time, he reported a syncopal episode, without injury,approximately 3 weeks prior while walking in his house. Zio patch showed predominant rhythm of sinus with an average heart rate of 75 bpm with 1 run of SVT lasting 4 beats along with rare PACs and PVCs. Echo showed an EF of 60 to 65%, normal LV cavity size, diastolic dysfunction, mildly dilated aortic root and ascending aorta, no significant valvular abnormalities.  He was seen on 09/26/2019, noting a2 to 3-week history of exertional chest pain that radiatedto the left shoulder,improvedwith rest, and wasdescribed as pressure with associated shortness of breath and fatigue. Lexiscan MPI on 10/07/2019, showed no significant ischemia with TWI noted in leads II, III, aVF, V5, and V6. EF 55-65%. No significant coronary artery calcium noted on CT images. Overall, this was a low risk study.  He was seen in follow-up on 10/16/2019 with continued symptoms, possibly a little worse with noted exertional chest pain, dyspnea, fatigue, and  dizziness.In this setting, he underwent diagnostic R/LHC on 10/18/2019 which showed normal coronary arteries, normal LVEDP, right heart catheterization showed normal right and left filling pressures, normal pulmonary pressure, and normal cardiac output. Symptoms were not felt to be cardiac in origin.  He was seen on 10/30/2019 and continued to note similar symptoms as above.  D-dimer was normal.  He underwent repeat echo on 11/15/2019 which demonstrated a low normal LVSF with an EF of 50 to 55%, no regional wall motion abnormalities, normal LV diastolic function, normal RV systolic function and ventricular cavity size, trivial mitral regurgitation, and borderline dilatation of the aortic root measuring 38 mm.  Labs obtained at that time showed a normal D-dimer, sed rate, and CRP.  Repeat Zio patch monitoring showed predominant rhythm of sinus with an average heart rate of 76 bpm, 2 short runs of SVT with the longest lasting 8 beats with a rate of 144 bpm, rare PACs and PVCs.  Patient reported symptoms including shortness of breath, dizziness, and lightheadedness occurred while the patient was in sinus rhythm.   CPX on 12/17/2019 showed his main functional limitation was due to deconditioning with no clear cardiopulmonary abnormality noted.    He was last seen in the office on 12/25/2019 as an urgent add-on for evaluation of labile heart rates.  He indicated his heart rates have fluctuated between 41 or 80 bpm over the preceding several months.  His elevated heart rates would typically last 5 minutes or less, then he reported feeling "terrible all day."  He was unable to capture any of the tachycardic rhythms on his Apple Watch.  He continued to report constant lightheadedness as well as constant  light pressure in the center and left side of his chest.  He continued to note frequent shortness of breath.  He was noted to be in sinus bradycardia at his visit.  His Toprol was decreased to 12.5 mg daily.  For continued  symptoms it was recommended he may require EP consultation.  No further work-up was recommended for his chronic chest pain and dyspnea given normal work-up as outlined above.  He was seen by his PCP on 1/27 with a several day history of increasing shortness of breath and intermittent left-sided chest pain.  D-dimer was elevated, therefore he was sent for CTA of the chest which revealed a subsegmental left-sided PE with no evidence of right heart strain.  High-sensitivity troponin negative x2.  Covid negative.  He was initially placed on IV heparin and was transitioned to oral Eliquis prior to discharge.  Lower extremity ultrasound was positive for DVT in the left femoral vein.  His hospital notes indicate his has DVT/PE were suspected to possibly be in the setting of testosterone use (self initiated).  Discharge summary indicates echo showed no evidence of right heart strain, however I do not see that an echo was performed during this admission.  Hypercoagulable work-up was initiated, after patient had been placed on heparin/anticoagulation.  He comes in doing well from a cardiac perspective.  He indicates just prior to developing dyspnea associated with his PE he was doing very well.  He indicates today that he had some old testosterone at home from several years ago and in the context of talking with one of his friends at the gym he self injected this testosterone prior to onset of the above symptoms.  Since his hospital discharge he has done well, is adherent and tolerating anticoagulation.  No falls, hematochezia, melena, hemoptysis, hematuria, or hematemesis.  He does note some mild orthopnea since being diagnosed with his PE.  No symptoms concerning for volume overload.  No presyncope or syncope.  He has received both Covid vaccines as well as the booster, most recently in 03/2020.  He plans to transition to Xarelto due to insurance coverage is once he has completed his supply of Eliquis.  No symptoms  concerning for significant bleeding.  He has hospital follow-up scheduled with his PCP later this afternoon.   Labs independently reviewed: 05/2020 - Hgb 14.6, PLT 233, potassium 3.7, BUN 17, serum creatinine 1.06, albumin 4.3, AST/ALT normal 11/2018 - A1c 6.0, TC 183, TG 90, HDL 41, LDL 124 01/2019 -TSH normal  Past Medical History:  Diagnosis Date  . Anxiety   . Depression   . Enteritis   . Gastritis   . GERD (gastroesophageal reflux disease)   . Gilbert's syndrome    hyperbilirubinemia  . IBS (irritable bowel syndrome)   . Inguinal hernia   . Insomnia   . Pre-diabetes   . Prostatitis 2008    Past Surgical History:  Procedure Laterality Date  . BACK SURGERY     2 ruptured discs  . CHOLECYSTECTOMY  01/31/2014   GB polyp, chronic cholecystitis   . ESOPHAGOGASTRODUODENOSCOPY  09/2004   gastritis acute and duodenitis  . ESOPHAGOGASTRODUODENOSCOPY  01-14-14   Dr Allen Norris  . EXPLORATORY LAPAROTOMY  Jan 2015   Dr Burt Knack  . LASIK    . RIGHT/LEFT HEART CATH AND CORONARY ANGIOGRAPHY N/A 10/18/2019   Procedure: RIGHT/LEFT HEART CATH AND CORONARY ANGIOGRAPHY;  Surgeon: Wellington Hampshire, MD;  Location: [facility];  Service: Cardiovascular;  Laterality: N/A;  Current Medications: Current Meds  Medication Sig  . albuterol (VENTOLIN HFA) 108 (90 Base) MCG/ACT inhaler Inhale 1-2 puffs into the lungs every 6 (six) hours as needed for wheezing or shortness of breath.  Marland Kitchen apixaban (ELIQUIS) 5 MG TABS tablet Take 1 tablet (5 mg total) by mouth 2 (two) times daily.  . clonazePAM (KLONOPIN) 0.5 MG tablet TAKE 1 TABLET (0.5 MG TOTAL) BY MOUTH 2 TIMES DAILY AS NEEDED FOR ANXIETY.  Marland Kitchen FLUoxetine (PROZAC) 40 MG capsule Take 1 capsule (40 mg total) by mouth daily.  . ondansetron (ZOFRAN) 4 MG tablet Take 1 tablet (4 mg total) by mouth every 8 (eight) hours as needed for nausea or vomiting.  . pantoprazole (PROTONIX) 40 MG tablet Take 1 tablet (40 mg total) by mouth daily. 30 min before food   . QUEtiapine (SEROQUEL) 50 MG tablet TAKE 1 TABLET BY MOUTH EVERYDAY AT BEDTIME  . SUMAtriptan (IMITREX) 50 MG tablet Take 1 tablet (50 mg total) by mouth as needed for migraine. May repeat in 2 hours if headache persists or recurs. No more than 2x per week. No more than 200 mg total/24 hours    Allergies:   Morphine and related, Morphine, and Tdap [tetanus-diphth-acell pertussis]   Social History   Socioeconomic History  . Marital status: Divorced    Spouse name: Not on file  . Number of children: 2  . Years of education: Not on file  . Highest education level: High school graduate  Occupational History  . Occupation: Music therapist: [employer]  Tobacco Use  . Smoking status: Never Smoker  . Smokeless tobacco: Never Used  Vaping Use  . Vaping Use: Never used  Substance and Sexual Activity  . Alcohol use: Yes    Comment: occassional social  . Drug use: No  . Sexual activity: Yes  Other Topics Concern  . Not on file  Social History Narrative   Married.   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   Enjoys watching racing, Dealer, deer hunting.             Social Determinants of Health   Financial Resource Strain: Not on file  Food Insecurity: Not on file  Transportation Needs: Not on file  Physical Activity: Not on file  Stress: Not on file  Social Connections: Not on file     Family History:  The patient's family history includes Asthma in his brother and mother; Cancer in his maternal uncle and mother; Colon cancer in his maternal grandfather; Colon polyps in his mother; Diabetes in an other family member; Heart Problems in his paternal grandfather; Heart attack in his father; Heart disease in his father; Pancreatic cancer in his maternal grandfather; Stroke in his cousin. There is no history of Mental illness.  ROS:   Review of Systems  Constitutional: Positive for malaise/fatigue. Negative for chills, diaphoresis,  fever and weight loss.  HENT: Negative for congestion.   Eyes: Negative for discharge and redness.  Respiratory: Positive for shortness of breath. Negative for cough, hemoptysis, sputum production and wheezing.   Cardiovascular: Negative for chest pain, palpitations, orthopnea, claudication, leg swelling and PND.  Gastrointestinal: Negative for abdominal pain, blood in stool, heartburn, melena, nausea and vomiting.  Genitourinary: Negative for hematuria.  Musculoskeletal: Negative for falls and myalgias.  Skin: Negative for rash.  Neurological: Negative for dizziness, tingling, tremors, sensory change, speech change, focal weakness, loss of consciousness and weakness.  Endo/Heme/Allergies: Does not bruise/bleed easily.  Psychiatric/Behavioral: Negative for substance abuse. The patient is not nervous/anxious.   All other systems reviewed and are negative.    EKGs/Labs/Other Studies Reviewed:    Studies reviewed were summarized above. The additional studies were reviewed today:  Zio patch 05/2018: Normal sinus rhythm with an average heart rate of 75 bpm. One run of supraventricular tachycardia lasting 4 beats with a maximum heart rate 115 bpm. Rare PACs and PVCs. __________  2D echo 05/2018: 1. The left ventricle has normal systolic function of 123456. The cavity  size is normal. There is no left ventricular wall thickness. Echo evidence  of impaired relaxation diastolic filling patterns.  2. Normal left atrial size.  3. Normal right atrial size.  4. Normal tricuspid valve.  5. Mild dilatation of the aortic root and ascending aorta.  6. The aortic valve normal in structure and function.Trileaflet.  7. There is normal RV systolic function. __________  Carlton Adam MPI 10/07/2019:  T wave inversion was noted during stress in the II, III, aVF, V5 and V6 leads.  The study is normal.  This is a low risk study.  The left ventricular ejection fraction is normal  (55-65%).  Suboptimal study due to GI uptake. __________  [facility] 10/18/2019: 1. Normal coronary arteries. 2. Left ventricular angiography was not performed. EF was normal by echo. Normal left ventricular end-diastolic pressure 3. Right heart catheterization showed normal right and left-sided filling pressures, normal pulmonary pressure and normal cardiac output.  Recommendations: Chest pain and shortness of breath seems to be not cardiac in origin based on normal right and left cardiac catheterization. __________  2D echo 11/15/2019: 1. Left ventricular ejection fraction, by estimation, is 50 to 55%. The  left ventricle has low normal function. The left ventricle has no regional  wall motion abnormalities. Left ventricular diastolic parameters were  normal.  2. Right ventricular systolic function is normal. The right ventricular  size is normal.  3. The mitral valve is normal in structure. Trivial mitral valve  regurgitation.  4. The aortic valve is tricuspid. Aortic valve regurgitation is not  visualized. No aortic stenosis is present.  5. Aortic dilatation noted. There is borderline dilatation of the aortic  root measuring 38 mm.  6. The inferior vena cava is normal in size with greater than 50%  respiratory variability, suggesting right atrial pressure of 3 mmHg. __________  Elwyn Reach patch 10/2019: Normal sinus rhythm with an average heart rate of 76 bpm. 2 short runs of SVT the longest lasted only 8 beats with a rate of 144 bpm. Rare PACs and rare PVCs. Reported symptoms included shortness of breath, dizziness and lightheadedness. He is noted to be in sinus rhythm during these reported symptoms. The 2 SVT episodes were not associated with symptoms. __________  CPX 11/2019: Mild functional limitation likely due mainly to decondition. No clear cardiopulmonary abnormality. Would recommend exercise training program and repeat testing in 6 months if symptoms  persist.   EKG:  EKG is ordered today.  The EKG ordered today demonstrates NSR with sinus arrhythmia, 72 bpm, no acute ST-T changes  Recent Labs: 06/18/2020: ALT 31 06/19/2020: BUN 17; Creatinine, Ser 1.06; Potassium 3.7; Sodium 141 06/20/2020: Hemoglobin 14.6; Platelets 233  Recent Lipid Panel    Component Value Date/Time   CHOL 183 12/19/2019 0901   CHOL 173 01/24/2019 0817   TRIG 90.0 12/19/2019 0901   HDL 41.40 12/19/2019 0901   HDL 43 01/24/2019 0817   CHOLHDL 4 12/19/2019 0901  VLDL 18.0 12/19/2019 0901   LDLCALC 124 (H) 12/19/2019 0901   LDLCALC 108 (H) 01/24/2019 0817    PHYSICAL EXAM:    VS:  BP 124/88 (BP Location: Left Arm, Patient Position: Sitting, Cuff Size: Normal)   Pulse 72   Ht 6\' 2"  (1.88 m)   Wt 205 lb (93 kg)   SpO2 98%   BMI 26.32 kg/m   BMI: Body mass index is 26.32 kg/m.  Physical Exam Vitals reviewed.  Constitutional:      Appearance: He is well-developed and well-nourished.  HENT:     Head: Normocephalic and atraumatic.  Eyes:     General:        Right eye: No discharge.        Left eye: No discharge.  Neck:     Vascular: No JVD.  Cardiovascular:     Rate and Rhythm: Normal rate and regular rhythm.     Pulses: No midsystolic click and no opening snap.          Posterior tibial pulses are 2+ on the right side and 2+ on the left side.     Heart sounds: Normal heart sounds, S1 normal and S2 normal. Heart sounds not distant. No murmur heard. No friction rub.  Pulmonary:     Effort: Pulmonary effort is normal. No respiratory distress.     Breath sounds: Normal breath sounds. No decreased breath sounds, wheezing or rales.  Chest:     Chest wall: No tenderness.  Abdominal:     General: There is no distension.     Palpations: Abdomen is soft.     Tenderness: There is no abdominal tenderness.  Musculoskeletal:        General: No edema.     Cervical back: Normal range of motion.  Skin:    General: Skin is warm and dry.     Nails: There  is no clubbing or cyanosis.  Neurological:     Mental Status: He is alert and oriented to person, place, and time.  Psychiatric:        Mood and Affect: Mood and affect normal.        Speech: Speech normal.        Behavior: Behavior normal.        Thought Content: Thought content normal.        Judgment: Judgment normal.     Wt Readings from Last 3 Encounters:  06/26/20 205 lb (93 kg)  06/18/20 200 lb (90.7 kg)  06/02/20 208 lb (94.3 kg)     ASSESSMENT & PLAN:   1. DVT/PE: Possibly occurring in the context of self initiating testosterone injection.  Doubtful this event is related to his COVID booster given this was administered back in 03/2020.  No evidence of right heart strain on CTA chest.  Currently on Eliquis DVT/PE dosing with plans to transition to Xarelto after is finished his current supply secondary to insurance coverage.  Could consider repeating hypercoagulable workup after he has completed anticoagulation therapy along with hematology referral.  Avoid hypercoagulable substances.  Recent hgb normal.  He sees his PCP for hospital follow up later today. Management is deferred to his PCP.   2. Chronic chest pain/shortness of breath: Since he was last seen symptoms had resolved prior to development of PE symptoms.  Extensive cardiac workup has been unrevealing of a cardiac etiology for his symptoms. Prior D-dimer in 10/2019 was normal and CPX in late 11/2019 did not demonstrate a pulmonary perfusion defect.  Following treatment of his DVT/PE he was encouraged to proceed with cardiopulmonary rehab.  Should symptoms of dyspnea return following adequate treatment of his PE repeat echo to evaluate RV function and PA pressures along with CPX could be considered.  3. Paroxysmal SVT/bradycardia: Quiescent.  No longer requiring standing beta-blocker.  Should symptoms redevelop following treatment of his DVT/PE EP referral could be considered down the road.  4. Anxiety/PTSD: Symptoms appear to  be well controlled.  Followed by behavorial health.   Disposition: F/u with Dr. Saunders Revel or an APP in 6 months.   Medication Adjustments/Labs and Tests Ordered: Current medicines are reviewed at length with the patient today.  Concerns regarding medicines are outlined above. Medication changes, Labs and Tests ordered today are summarized above and listed in the Patient Instructions accessible in Encounters.   Signed, [name], PA-C 06/26/2020 2:18 PM     [address], [place] [address] [phone]

## 2020-06-23 NOTE — Progress Notes (Signed)
Virtual Visit via Video Note  I connected with Sergio Garcia on 06/23/20 at 10:00 AM EST by a video enabled telemedicine application and verified that I am speaking with the correct person using two identifiers.  Participating Parties Patient Provider  Location: Patient: Worksite Provider: Home Office   I discussed the limitations of evaluation and management by telemedicine and the availability of in person appointments. The patient expressed understanding and agreed to proceed.  THERAPY PROGRESS NOTE  Session Time: 15 Minutes  Participation Level: Active  Behavioral Response: Well GroomedAlertEuthymic  Type of Therapy: Individual Therapy  Treatment Goals addressed:  Identify the symptoms of PTSD that have caused stress and impaired functioning - ongoing Implement a regular exercise regimen as a stress release techniques - ongoing Take medication(s) as prescribed and report as to the effectiveness and side effects - ongoing  Interventions: Supportive  Summary: Sergio Garcia is a 45 y.o. male who presents with PTSD and anxiety sxs. Pt apologized for joining video session about 10 minutes late. 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 Pt reported he went to his PCP after experiencing continuous chest pains and had a CT scan. Pt reported they found some blood clots and was admitted to the hospital for 3 days last week. Pt reported he is taking blood thinners and making changes to his diet as recommended by his doctor. Pt reported he has not been back to the gym since leaving hospital s/ "I did not want to push myself too much" and is still experiencing some chest pains, "but not as bad". Pt reported improved sleep, however still experiencing flashbacks at times. Pt reported he copes by staying busy at work and remodeling kitchen at home. Pt reported one of his friends was killed by a drunk driver last week and attended funeral yesterday. Pt reported  "still can't believe it happened" and is managing grief sxs. Pt reported he continues to "hang out" with past girlfriend and decided not to return to dating, but remain friends at this time. Pt reported things are better at work, "I actually look forward to going everyday". Pt reported no other concerns at this time. Session ended early to allow patient to get back to work. 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  Suicidal/Homicidal: No  Therapist Response: Therapist met with patient for follow up. Therapist and patient reviewed sxs, recent health issues, and ways patient is coping. Therapist validated patient feelings/experiences.  Plan: Return again in 2 weeks.  Diagnosis: Axis I: Generalized Anxiety Disorder, Post Traumatic Stress Disorder and Chest pain unrelated to panic attack    Axis II: N/A  Josephine Igo, LCSW, LCAS 06/23/2020

## 2020-06-23 NOTE — Telephone Encounter (Signed)
Patient informed and verbalized understanding

## 2020-06-23 NOTE — Chronic Care Management (AMB) (Signed)
  Care Management   Note  06/23/2020 Name: DAYLE MCNERNEY MRN: [id number] DOB: March 13, 1976  SHONE LEVENTHAL is a 45 y.o. year old male who is a primary care patient of [name], [name], MD. I reached out to [name] by phone today in response to a referral sent by Mr. Kerron Sedano [facility] health plan.    Mr. Fleet was given information about care management services today including:  1. Care management services include personalized support from designated clinical staff supervised by his physician, including individualized plan of care and coordination with other care providers 2. 24/7 contact phone numbers for assistance for urgent and routine care needs. 3. The patient may stop care management services at any time by phone call to the office staff.  Patient agreed to services and verbal consent obtained.   Follow up plan: Telephone appointment with care management team member scheduled for:07/03/2020  Noreene Larsson, [address], [place], [place] [address] Direct Dial: [phone] [email or web address]

## 2020-06-24 LAB — FACTOR 5 LEIDEN

## 2020-06-25 LAB — PROTHROMBIN GENE MUTATION

## 2020-06-26 ENCOUNTER — Other Ambulatory Visit: Payer: Self-pay

## 2020-06-26 ENCOUNTER — Ambulatory Visit (INDEPENDENT_AMBULATORY_CARE_PROVIDER_SITE_OTHER): Payer: BC Managed Care – PPO | Admitting: Physician Assistant

## 2020-06-26 ENCOUNTER — Encounter: Payer: Self-pay | Admitting: Internal Medicine

## 2020-06-26 ENCOUNTER — Ambulatory Visit (INDEPENDENT_AMBULATORY_CARE_PROVIDER_SITE_OTHER): Payer: BC Managed Care – PPO | Admitting: Internal Medicine

## 2020-06-26 ENCOUNTER — Encounter: Payer: Self-pay | Admitting: Physician Assistant

## 2020-06-26 VITALS — BP 110/78 | HR 86 | Temp 98.2°F | Ht 74.0 in | Wt 204.8 lb

## 2020-06-26 VITALS — BP 124/88 | HR 72 | Ht 74.0 in | Wt 205.0 lb

## 2020-06-26 DIAGNOSIS — Z20822 Contact with and (suspected) exposure to covid-19: Secondary | ICD-10-CM | POA: Diagnosis not present

## 2020-06-26 DIAGNOSIS — J0111 Acute recurrent frontal sinusitis: Secondary | ICD-10-CM

## 2020-06-26 DIAGNOSIS — R0602 Shortness of breath: Secondary | ICD-10-CM | POA: Diagnosis not present

## 2020-06-26 DIAGNOSIS — F4310 Post-traumatic stress disorder, unspecified: Secondary | ICD-10-CM

## 2020-06-26 DIAGNOSIS — I2699 Other pulmonary embolism without acute cor pulmonale: Secondary | ICD-10-CM

## 2020-06-26 DIAGNOSIS — R059 Cough, unspecified: Secondary | ICD-10-CM | POA: Diagnosis not present

## 2020-06-26 DIAGNOSIS — F419 Anxiety disorder, unspecified: Secondary | ICD-10-CM

## 2020-06-26 DIAGNOSIS — I824Y9 Acute embolism and thrombosis of unspecified deep veins of unspecified proximal lower extremity: Secondary | ICD-10-CM

## 2020-06-26 DIAGNOSIS — R079 Chest pain, unspecified: Secondary | ICD-10-CM

## 2020-06-26 DIAGNOSIS — I471 Supraventricular tachycardia: Secondary | ICD-10-CM | POA: Diagnosis not present

## 2020-06-26 DIAGNOSIS — R42 Dizziness and giddiness: Secondary | ICD-10-CM

## 2020-06-26 DIAGNOSIS — I824Y2 Acute embolism and thrombosis of unspecified deep veins of left proximal lower extremity: Secondary | ICD-10-CM

## 2020-06-26 DIAGNOSIS — R001 Bradycardia, unspecified: Secondary | ICD-10-CM

## 2020-06-26 MED ORDER — AZITHROMYCIN 250 MG PO TABS: ORAL_TABLET | ORAL | 0 refills | Status: DC

## 2020-06-26 MED ORDER — SALINE SPRAY 0.65 % NA SOLN: 1.0000 | NASAL | 2 refills | Status: DC | PRN

## 2020-06-26 MED ORDER — FLUTICASONE PROPIONATE 50 MCG/ACT NA SUSP: 2.0000 | Freq: Every day | NASAL | 2 refills | Status: DC

## 2020-06-26 MED ORDER — DM-GUAIFENESIN ER 60-1200 MG PO TB12
1.0000 | ORAL_TABLET | Freq: Two times a day (BID) | ORAL | 0 refills | Status: DC
Start: 2020-06-26 — End: 2020-07-17

## 2020-06-26 NOTE — Patient Instructions (Signed)
Medication Instructions:  No changes  *If you need a refill on your cardiac medications before your next appointment, please call your pharmacy*   Lab Work: None  If you have labs (blood work) drawn today and your tests are completely normal, you will receive your results only by: Marland Kitchen MyChart Message (if you have MyChart) OR . A paper copy in the mail If you have any lab test that is abnormal or we need to change your treatment, we will call you to review the results.   Testing/Procedures: None   Follow-Up: At [facility], you and your health needs are our priority.  As part of our continuing mission to provide you with exceptional heart care, we have created designated Provider Care Teams.  These Care Teams include your primary Cardiologist (physician) and Advanced Practice Providers (APPs -  Physician Assistants and Nurse Practitioners) who all work together to provide you with the care you need, when you need it.   Your next appointment:   6 month(s)  The format for your next appointment:   In Person  Provider:   Kathlyn Sacramento, MD or [name], PA-C

## 2020-06-26 NOTE — Progress Notes (Addendum)
Telephone Note  I connected with Sergio Garcia  on 06/26/20 at  3:30PM EST by telephone and verified that I am speaking with the correct person using two identifiers.  Location patient: home, [place] Location provider:work or home office Persons participating in the virtual visit: patient, provider  I discussed the limitations of evaluation and management by telemedicine and the availability of in person appointments. The patient expressed understanding and agreed to proceed.   HPI: HFU [facility] 06/18/20 to 06/20/20 left lower ext DVT and IMPRESSION: Partially occlusive thrombus within the distal left femoral vein. On eliquis starter pk and and now 5 mg bid if cant afford in future will need xarelto switch due to cost   C/o recurrent sinus pain/pressure covid tests in hospital negative x 2 pain around nose, temples has a cough and runny nose worse since Tuesday of this wekk nothing tried  Will consider covid testing again If not better Will refer back to ENT consider allergy testing   ROS: See pertinent positives and negatives per HPI.  Past Medical History:  Diagnosis Date  . Anxiety   . Depression   . Enteritis   . Gastritis   . GERD (gastroesophageal reflux disease)   . Gilbert's syndrome    hyperbilirubinemia  . IBS (irritable bowel syndrome)   . Inguinal hernia   . Insomnia   . Pre-diabetes   . Prostatitis 2008    Past Surgical History:  Procedure Laterality Date  . BACK SURGERY     2 ruptured discs  . CHOLECYSTECTOMY  01/31/2014   GB polyp, chronic cholecystitis   . ESOPHAGOGASTRODUODENOSCOPY  09/2004   gastritis acute and duodenitis  . ESOPHAGOGASTRODUODENOSCOPY  01-14-14   Dr Allen Norris  . EXPLORATORY LAPAROTOMY  Jan 2015   Dr Burt Knack  . LASIK    . RIGHT/LEFT HEART CATH AND CORONARY ANGIOGRAPHY N/A 10/18/2019   Procedure: RIGHT/LEFT HEART CATH AND CORONARY ANGIOGRAPHY;  Surgeon: Wellington Hampshire, MD;  Location: [facility];  Service: Cardiovascular;  Laterality: N/A;      Current Outpatient Medications:  .  albuterol (VENTOLIN HFA) 108 (90 Base) MCG/ACT inhaler, Inhale 1-2 puffs into the lungs every 6 (six) hours as needed for wheezing or shortness of breath., Disp: 18 g, Rfl: 11 .  apixaban (ELIQUIS) 5 MG TABS tablet, Take 1 tablet (5 mg total) by mouth 2 (two) times daily., Disp: 60 tablet, Rfl: 2 .  azithromycin (ZITHROMAX) 250 MG tablet, 2 pills day 1 and 1 pill day 2-5, Disp: 6 tablet, Rfl: 0 .  cetirizine (ZYRTEC) 10 MG tablet, Take 10 mg by mouth daily., Disp: , Rfl:  .  clonazePAM (KLONOPIN) 0.5 MG tablet, TAKE 1 TABLET (0.5 MG TOTAL) BY MOUTH 2 TIMES DAILY AS NEEDED FOR ANXIETY., Disp: 60 tablet, Rfl: 1 .  Dextromethorphan-Guaifenesin 60-1200 MG 12hr tablet, Take 1 tablet by mouth every 12 (twelve) hours., Disp: 30 tablet, Rfl: 0 .  FLUoxetine (PROZAC) 40 MG capsule, Take 1 capsule (40 mg total) by mouth daily., Disp: 90 capsule, Rfl: 0 .  fluticasone (FLONASE) 50 MCG/ACT nasal spray, Place 2 sprays into both nostrils daily., Disp: 16 g, Rfl: 2 .  ondansetron (ZOFRAN) 4 MG tablet, Take 1 tablet (4 mg total) by mouth every 8 (eight) hours as needed for nausea or vomiting., Disp: 40 tablet, Rfl: 0 .  pantoprazole (PROTONIX) 40 MG tablet, Take 1 tablet (40 mg total) by mouth daily. 30 min before food, Disp: 90 tablet, Rfl: 3 .  QUEtiapine (SEROQUEL) 50 MG  tablet, TAKE 1 TABLET BY MOUTH EVERYDAY AT BEDTIME, Disp: 90 tablet, Rfl: 1 .  sodium chloride (OCEAN) 0.65 % SOLN nasal spray, Place 1 spray into both nostrils as needed for congestion., Disp: 30 mL, Rfl: 2 .  SUMAtriptan (IMITREX) 50 MG tablet, Take 1 tablet (50 mg total) by mouth as needed for migraine. May repeat in 2 hours if headache persists or recurs. No more than 2x per week. No more than 200 mg total/24 hours, Disp: 9 tablet, Rfl: 2  EXAM:  VITALS per patient if applicable:  GENERAL: alert, oriented, appears well and in no acute distress  PSYCH/NEURO: pleasant and cooperative, no obvious  depression or anxiety, speech and thought processing grossly intact  ASSESSMENT AND PLAN:  Discussed the following assessment and plan:  Acute recurrent frontal sinusitis - Plan: fluticasone (FLONASE) 50 MCG/ACT nasal spray, sodium chloride (OCEAN) 0.65 % SOLN nasal spray, azithromycin (ZITHROMAX) 250 MG tablet, Dextromethorphan-Guaifenesin 60-1200 MG 12hr tablet, Novel Coronavirus, NAA ([facility]) Cough - Plan: Dextromethorphan-Guaifenesin 60-1200 MG 12hr tablet, Novel Coronavirus, NAA ([facility]) Prn Zyrtec   Exposure to COVID-19 virus - Plan: Novel Coronavirus, NAA ([facility]) May call and sch Monday if not feeling well   Acute pulmonary embolism without acute cor pulmonale, unspecified pulmonary embolism type (HCC) Acute deep vein thrombosis (DVT) of proximal vein of left lower extremity (HCC) 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 Tami Ribas call for cancellations  Prn meclizine in case vertigo  Will rec covid 19 testing   -we discussed possible serious and likely etiologies, options for evaluation and workup, limitations of telemedicine visit vs in person visit, treatment, treatment risks and precautions.    I discussed the assessment and treatment plan with the patient. The patient was provided an opportunity to ask questions and all were answered. The patient agreed with the plan and demonstrated an understanding of the instructions.    Time spent 20 min Delorise Jackson, MD

## 2020-06-29 ENCOUNTER — Encounter: Payer: Self-pay | Admitting: Internal Medicine

## 2020-06-30 ENCOUNTER — Telehealth: Payer: Self-pay | Admitting: Internal Medicine

## 2020-06-30 NOTE — Telephone Encounter (Signed)
Re my chart message  Sinus issues can make you dizzy appt ent Dr. Tami Ribas I believe 07/15/20 placed referral appt on your desk  Has he fallen and hit head on blood thinners? This could make dizzy if hit his head  Klonopin, seroquel psych meds can make dizzy  Make sure drinking 55-64 ounces water daily

## 2020-06-30 NOTE — Telephone Encounter (Signed)
Patient informed and verbalized understanding.  States he has not hit his head and that he has been out of the [employer]

## 2020-07-01 ENCOUNTER — Other Ambulatory Visit: Payer: Self-pay | Admitting: Internal Medicine

## 2020-07-01 ENCOUNTER — Encounter: Payer: Self-pay | Admitting: Internal Medicine

## 2020-07-01 DIAGNOSIS — R42 Dizziness and giddiness: Secondary | ICD-10-CM

## 2020-07-01 MED ORDER — MECLIZINE HCL 12.5 MG PO TABS: 12.5000 mg | ORAL_TABLET | Freq: Two times a day (BID) | ORAL | 0 refills | Status: DC | PRN

## 2020-07-01 NOTE — Telephone Encounter (Signed)
Patient's significant other calling back in to get clarification on information given to the Patient yesterday. Went over the below message with her. She confirms he is not taking the Klonopin and has not fallen or hit his head.   She states that the Patient had a really bad bout of dizziness on Monday and has been ongoing.  Informed her to call ENT for cancellations to see if his appointment can be moved up.   She states she will do say. Wants to know if Patient is not better in a few days and his ENT appointment can not be moved up, can he request another appointment here?   Please advise

## 2020-07-01 NOTE — Telephone Encounter (Signed)
Sent meclizine for dizziness 1-2 pills up to 2x per day as needed  Caution can make sleepy  Does he want me to order CT head?

## 2020-07-01 NOTE — Addendum Note (Signed)
Addended by: Orland Mustard on: 07/01/2020 06:13 PM   Modules accepted: Orders

## 2020-07-02 ENCOUNTER — Other Ambulatory Visit: Payer: Self-pay

## 2020-07-02 ENCOUNTER — Ambulatory Visit
Admission: RE | Admit: 2020-07-02 | Discharge: 2020-07-02 | Disposition: A | Discharge status: Home / Self Care | Payer: BC Managed Care – PPO | Source: Ambulatory Visit | Attending: Internal Medicine | Admitting: Internal Medicine

## 2020-07-02 DIAGNOSIS — R42 Dizziness and giddiness: Secondary | ICD-10-CM

## 2020-07-02 NOTE — Telephone Encounter (Signed)
Message sent to Patient via mychart and he is agreeable to the CT. According to Dr Olivia Mackie [name] she is placing this order for him

## 2020-07-02 NOTE — Telephone Encounter (Signed)
See 06/30/20 telephone encounter.  Spoke with Dr Olivia Mackie [name] this morning and she has placed the order for the head CT.

## 2020-07-02 NOTE — Telephone Encounter (Signed)
-----   Message from Delorise Jackson, MD sent at 07/01/2020  6:14 PM EST ----- Ordered CT head for dizziness please sch see last note, meclizine  Also try to get pt on covid 19 testing list   Thank you

## 2020-07-03 ENCOUNTER — Other Ambulatory Visit: Payer: BC Managed Care – PPO

## 2020-07-03 ENCOUNTER — Ambulatory Visit: Payer: BC Managed Care – PPO | Admitting: Pharmacist

## 2020-07-03 DIAGNOSIS — J0111 Acute recurrent frontal sinusitis: Secondary | ICD-10-CM

## 2020-07-03 DIAGNOSIS — R059 Cough, unspecified: Secondary | ICD-10-CM

## 2020-07-03 DIAGNOSIS — I2699 Other pulmonary embolism without acute cor pulmonale: Secondary | ICD-10-CM

## 2020-07-03 DIAGNOSIS — F419 Anxiety disorder, unspecified: Secondary | ICD-10-CM

## 2020-07-03 DIAGNOSIS — Z20822 Contact with and (suspected) exposure to covid-19: Secondary | ICD-10-CM

## 2020-07-03 NOTE — Patient Instructions (Signed)
Visit Information  PATIENT GOALS:  Goals Addressed              This Visit's Progress     Patient Stated   .  Medication Management (pt-stated)        Patient Goals/Self-Care Activities . Over the next 30 days, patient will:  - collaborate with provider on medication access solutions       Sergio Garcia was given information about Care Management services today including:  1. Care Management services include personalized support from designated clinical staff supervised by his physician, including individualized plan of care and coordination with other care providers 2. 24/7 contact phone numbers for assistance for urgent and routine care needs. 3. The patient may stop CCM services at any time (effective at the end of the month) by phone call to the office staff.  Patient agreed to services and verbal consent obtained.   Patient verbalizes understanding of instructions provided today and agrees to view in [place].   Plan: Telephone follow up appointment with care management team member scheduled for:  ~ 2 weeks  Catie Darnelle Maffucci, PharmD, [place], [place] Clinical Pharmacist [employer] [facility] [employer] [phone]

## 2020-07-03 NOTE — Chronic Care Management (AMB) (Signed)
Care Management   Pharmacy Note  07/03/2020 Name: Sergio Garcia MRN: [id number] DOB: 1975-11-04  Subjective: Sergio Garcia is a 45 y.o. year old male who is a primary care patient of [name], [name], MD. The Care Management team was consulted for assistance with care management and care coordination needs.    Engaged with patient by telephone for initial visit in response to provider referral for pharmacy case management and/or care coordination services.   The patient was given information about Care Management services today including:  1. Care Management services includes personalized support from designated clinical staff supervised by the patient's primary care provider, including individualized plan of care and coordination with other care providers. 2. 24/7 contact phone numbers for assistance for urgent and routine care needs. 3. The patient may stop case management services at any time by phone call to the office staff.  Patient agreed to services and consent obtained.  Assessment:  Review of patient status, including review of consultants reports, laboratory and other test data, was performed as part of comprehensive evaluation and provision of chronic care management services.   SDOH (Social Determinants of Health) assessments and interventions performed:  SDOH Interventions   Flowsheet Row Most Recent Value  SDOH Interventions   Financial Strain Interventions Other (Comment)  [Insurance access]       Objective:  Lab Results  Component Value Date   CREATININE 1.06 06/19/2020   CREATININE 1.03 06/18/2020   CREATININE 1.02 10/16/2019    Lab Results  Component Value Date   HGBA1C 6.0 12/19/2019       Component Value Date/Time   CHOL 183 12/19/2019 0901   CHOL 173 01/24/2019 0817   TRIG 90.0 12/19/2019 0901   HDL 41.40 12/19/2019 0901   HDL 43 01/24/2019 0817   CHOLHDL 4 12/19/2019 0901   VLDL 18.0 12/19/2019 0901   LDLCALC 124 (H) 12/19/2019 0901    LDLCALC 108 (H) 01/24/2019 0817    Clinical ASCVD: No  The 10-year ASCVD risk score [name], et al., 2013) is: 1.2%   Values used to calculate the score:     Age: 64 years     Sex: Male     Is Non-Hispanic African American: No     Diabetic: No     Tobacco smoker: No     Systolic Blood Pressure: 245 mmHg     Is BP treated: No     HDL Cholesterol: 41.4 mg/dL     Total Cholesterol: 183 mg/dL     BP Readings from Last 3 Encounters:  06/26/20 110/78  06/26/20 124/88  06/20/20 130/85    Care Plan  Allergies  Allergen Reactions  . Morphine And Related Anaphylaxis  . Testosterone     Dvt/PE  . Morphine Nausea And Vomiting  . Tdap [Tetanus-Diphth-Acell Pertussis] Hives and Rash    Medications Reviewed Today    Reviewed by De Hollingshead, RPH-CPP (Pharmacist) on 07/03/20 at 1120  Med List Status: <None>  Medication Order Taking? Sig Documenting Provider Last Dose Status Informant  albuterol (VENTOLIN HFA) 108 (90 Base) MCG/ACT inhaler [id number] Yes Inhale 1-2 puffs into the lungs every 6 (six) hours as needed for wheezing or shortness of breath. [name], [name], MD Taking Active Self  apixaban (ELIQUIS) 5 MG TABS tablet [id number] Yes Take 1 tablet (5 mg total) by mouth 2 (two) times daily. Shawna Clamp, MD Taking Active   cetirizine (ZYRTEC) 10 MG tablet [id number] Yes Take 10 mg by mouth daily.  [provider] Taking Active   clonazePAM (KLONOPIN) 0.5 MG tablet [id number] Yes TAKE 1 TABLET (0.5 MG TOTAL) BY MOUTH 2 TIMES DAILY AS NEEDED FOR ANXIETY. Ursula Alert, MD Taking Active Self           Med Note De Hollingshead   Fri Jul 03, 2020 11:17 AM) Using PRN  Dextromethorphan-Guaifenesin 60-1200 MG 12hr tablet [id number] Yes Take 1 tablet by mouth every 12 (twelve) hours. [name], [name], MD Taking Active   FLUoxetine (PROZAC) 40 MG capsule [id number] Yes Take 1 capsule (40 mg total) by mouth daily. Ursula Alert, MD Taking Active Self   fluticasone (FLONASE) 50 MCG/ACT nasal spray [id number] Yes Place 2 sprays into both nostrils daily. [name], [name], MD Taking Active   meclizine (ANTIVERT) 12.5 MG tablet [id number] No Take 1-2 tablets (12.5-25 mg total) by mouth 2 (two) times daily as needed for dizziness.  Patient not taking: Reported on 07/03/2020   [name], [name], MD Not Taking Active   ondansetron (ZOFRAN) 4 MG tablet [id number] No Take 1 tablet (4 mg total) by mouth every 8 (eight) hours as needed for nausea or vomiting.  Patient not taking: Reported on 07/03/2020   [name], [name], MD Not Taking Active Self  pantoprazole (PROTONIX) 40 MG tablet [id number] Yes Take 1 tablet (40 mg total) by mouth daily. 30 min before food [name], [name], MD Taking Active Self  QUEtiapine (SEROQUEL) 50 MG tablet [id number] Yes TAKE 1 TABLET BY MOUTH EVERYDAY AT BEDTIME Eappen, Ria Clock, MD Taking Active Self  sodium chloride (OCEAN) 0.65 % SOLN nasal spray [id number] Yes Place 1 spray into both nostrils as needed for congestion. [name], [name], MD Taking Active   SUMAtriptan (IMITREX) 50 MG tablet [id number] Yes Take 1 tablet (50 mg total) by mouth as needed for migraine. May repeat in 2 hours if headache persists or recurs. No more than 2x per week. No more than 200 mg total/24 hours [name], [name], MD Taking Active Self          Patient Active Problem List   Diagnosis Date Noted  . Acute deep vein thrombosis (DVT) of proximal vein of left lower extremity ([place]) 06/22/2020  . Pulmonary embolism ([place]) 06/18/2020  . Elevated blood pressure reading 06/18/2020  . No-show for appointment 06/09/2020  . PSVT (paroxysmal supraventricular tachycardia) ([place]) 12/26/2019  . Bradycardia 12/26/2019  . Hyperlipidemia 12/19/2019  . Panic attacks 11/21/2019  . Mucous retention cyst of maxillary sinus 02/21/2019  . Weight loss, non-intentional 02/21/2019  . Generalized anxiety disorder 02/14/2019  .  Prediabetes 01/29/2019  . PTSD (post-traumatic stress disorder) 01/23/2019  . 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 Elevated bilirubin 03/07/2018  . Palpitations 03/07/2018  . PVC's (premature ventricular contractions) 03/07/2018  . Back pain 10/06/2016  . Routine general medical examination at a health care facility 06/08/2015  . Exertional chest pain 10/07/2014  . Anxiety 01/23/2014  . Elevated LFTs 07/26/2013  . Chronic prostatitis 02/26/2013  . Orchalgia 02/26/2013  . Other procreative management counseling and advice 02/26/2013  . Shortness of breath 12/25/2012  . GERD (gastroesophageal reflux disease) 12/20/2012  . Chest pain 12/18/2012  . 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 Inguinal hernia, right 10/01/2009  . GILBERT'S SYNDROME 09/28/2009  . Irritable bowel syndrome with diarrhea 09/28/2009  . 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 ALLERGIC RHINITIS 08/30/2007    Conditions to be addressed/monitored: Anxiety and VTE  Care Plan : Medication Management  Updates made by  De Hollingshead, RPH-CPP since 07/03/2020 12:00 AM    Problem: VTE (DVT/PE), Anxiety     Long-Range Goal: Disease Progression Prevention   Start Date: 07/03/2020  This Visit's Progress: On track  Priority: High  Note:   Current Barriers:  . Unable to independently afford treatment regimen  Pharmacist Clinical Goal(s):  Marland Kitchen Over the next 90 days, patient will verbalize ability to afford treatment regimen through collaboration with PharmD and provider.   Interventions: . 1:1 collaboration with [name], [name], MD regarding development and update of comprehensive plan of care as evidenced by provider attestation and co-signature . Inter-disciplinary care team collaboration (see longitudinal plan of care) . Comprehensive medication review performed; medication list updated in electronic medical record  Recent provoked DVT/PE (05/2020): Marland Kitchen Suspected to have  been provoked by use of old testosterone prescription; current treatment: Eliquis 5 mg BID . Procoagulable state work up has been negative thus far. Per hospital discharge, plan for treatment for 6-9 months.  . Received 30 day supply at discharge (manufacturer savings card). Patient's insurance plan prefers Xarelto. Plan to switch to Xarelto 20 mg daily when current Eliquis supply is finished.  . Patient confirms adherence, tolerability. Improvement in SOB is difficult to tell d/t concurrent respiratory infection. Continue to follow. Will outreach again in 2 weeks to switch to Xarelto, ensure insurance coverage.   Anxiety/PTSD . Moderately well controlled; current treatment: fluoxetine 40 mg daily, quetiapine 50 mg QPM; clonazepam PRN,    Acute Respiratory Infection, Dizziness: . Uncontrolled, COVID test scheduled today. Current treatment: cetirizine 10 mg PRN, albuterol HFA PRN, mucinex PRN, fluticasone nasal PRN, nasal saline, ondansetron 4 mg PRN (has not needed recently) . Has not used meclizine 12.5 mg PRN yet, was concerned about sedation impacting ability to work. Will try this weekend . Follow results of COVID test for appropriate quarantine procedure. Continue supportive care  Patient Goals/Self-Care Activities . Over the next 30 days, patient will:  - collaborate with provider on medication access solutions  Follow Up Plan: Telephone follow up appointment with care management team member scheduled for: ~ 2 weeks      Medication Assistance:  None required.  Patient affirms current coverage meets needs.  Follow Up:  Patient agrees to Care Plan and Follow-up.  Plan: Telephone follow up appointment with care management team member scheduled for:  ~ 2 weeks  Catie Darnelle Maffucci, PharmD, [place], [place] Clinical Pharmacist [employer] at [employer] [phone]

## 2020-07-04 ENCOUNTER — Emergency Department: Payer: BC Managed Care – PPO

## 2020-07-04 ENCOUNTER — Encounter: Payer: Self-pay | Admitting: Radiology

## 2020-07-04 ENCOUNTER — Emergency Department
Admission: EM | Admit: 2020-07-04 | Discharge: 2020-07-04 | Disposition: A | Discharge status: Home / Self Care | Payer: BC Managed Care – PPO | Attending: Emergency Medicine | Admitting: Emergency Medicine

## 2020-07-04 ENCOUNTER — Other Ambulatory Visit: Payer: Self-pay

## 2020-07-04 DIAGNOSIS — Z86718 Personal history of other venous thrombosis and embolism: Secondary | ICD-10-CM | POA: Diagnosis not present

## 2020-07-04 DIAGNOSIS — J1282 Pneumonia due to coronavirus disease 2019: Secondary | ICD-10-CM | POA: Insufficient documentation

## 2020-07-04 DIAGNOSIS — Z7901 Long term (current) use of anticoagulants: Secondary | ICD-10-CM | POA: Diagnosis not present

## 2020-07-04 DIAGNOSIS — U071 COVID-19: Secondary | ICD-10-CM | POA: Diagnosis not present

## 2020-07-04 DIAGNOSIS — R0602 Shortness of breath: Secondary | ICD-10-CM | POA: Diagnosis present

## 2020-07-04 HISTORY — DX: COVID-19: U07.1

## 2020-07-04 LAB — RESP PANEL BY RT-PCR (FLU A&B, COVID) ARPGX2
Influenza A by PCR: NEGATIVE
Influenza B by PCR: NEGATIVE
SARS Coronavirus 2 by RT PCR: POSITIVE — AB

## 2020-07-04 LAB — HEPATIC FUNCTION PANEL
ALT: 38 U/L (ref 0–44)
AST: 56 U/L — ABNORMAL HIGH (ref 15–41)
Albumin: 4.5 g/dL (ref 3.5–5.0)
Alkaline Phosphatase: 60 U/L (ref 38–126)
Bilirubin, Direct: 0.1 mg/dL (ref 0.0–0.2)
Indirect Bilirubin: 1.2 mg/dL — ABNORMAL HIGH (ref 0.3–0.9)
Total Bilirubin: 1.3 mg/dL — ABNORMAL HIGH (ref 0.3–1.2)
Total Protein: 8 g/dL (ref 6.5–8.1)

## 2020-07-04 LAB — BASIC METABOLIC PANEL
Anion gap: 16 — ABNORMAL HIGH (ref 5–15)
BUN: 16 mg/dL (ref 6–20)
CO2: 20 mmol/L — ABNORMAL LOW (ref 22–32)
Calcium: 9.8 mg/dL (ref 8.9–10.3)
Chloride: 106 mmol/L (ref 98–111)
Creatinine, Ser: 1.09 mg/dL (ref 0.61–1.24)
GFR, Estimated: >60 mL/min (ref >60)
Glucose, Bld: 144 mg/dL — ABNORMAL HIGH (ref 70–99)
Potassium: 3.4 mmol/L — ABNORMAL LOW (ref 3.5–5.1)
Sodium: 142 mmol/L (ref 135–145)

## 2020-07-04 LAB — CBC WITH DIFFERENTIAL/PLATELET
Abs Immature Granulocytes: 0.01 10*3/uL (ref 0.00–0.07)
Basophils Absolute: 0 10*3/uL (ref 0.0–0.1)
Basophils Relative: 0 %
Eosinophils Absolute: 0.1 10*3/uL (ref 0.0–0.5)
Eosinophils Relative: 2 %
HCT: 45.2 % (ref 39.0–52.0)
Hemoglobin: 15.6 g/dL (ref 13.0–17.0)
Immature Granulocytes: 0 %
Lymphocytes Relative: 43 %
Lymphs Abs: 2.9 10*3/uL (ref 0.7–4.0)
MCH: 30.5 pg (ref 26.0–34.0)
MCHC: 34.5 g/dL (ref 30.0–36.0)
MCV: 88.5 fL (ref 80.0–100.0)
Monocytes Absolute: 0.5 10*3/uL (ref 0.1–1.0)
Monocytes Relative: 8 %
Neutro Abs: 3.3 10*3/uL (ref 1.7–7.7)
Neutrophils Relative %: 47 %
Platelets: 312 10*3/uL (ref 150–400)
RBC: 5.11 MIL/uL (ref 4.22–5.81)
RDW: 12.3 % (ref 11.5–15.5)
WBC: 6.8 10*3/uL (ref 4.0–10.5)
nRBC: 0 % (ref 0.0–0.2)

## 2020-07-04 LAB — LIPASE, BLOOD: Lipase: 35 U/L (ref 11–51)

## 2020-07-04 LAB — TROPONIN I (HIGH SENSITIVITY)
Troponin I (High Sensitivity): 5 ng/L (ref <18)
Troponin I (High Sensitivity): 6 ng/L (ref <18)

## 2020-07-04 LAB — BRAIN NATRIURETIC PEPTIDE: B Natriuretic Peptide: 25.4 pg/mL (ref 0.0–100.0)

## 2020-07-04 MED ORDER — AZITHROMYCIN 250 MG PO TABS: ORAL_TABLET | ORAL | 0 refills | Status: AC

## 2020-07-04 MED ORDER — HYDROMORPHONE HCL 1 MG/ML IJ SOLN
0.5000 mg | Freq: Once | INTRAMUSCULAR | Status: AC
  Administered 2020-07-04: 0.5 mg via INTRAVENOUS
  Filled 2020-07-04: qty 1

## 2020-07-04 MED ORDER — ONDANSETRON HCL 4 MG/2ML IJ SOLN
4.0000 mg | Freq: Once | INTRAMUSCULAR | Status: AC
  Administered 2020-07-04: 4 mg via INTRAVENOUS
  Filled 2020-07-04: qty 2

## 2020-07-04 MED ORDER — METHYLPREDNISOLONE SODIUM SUCC 125 MG IJ SOLR
125.0000 mg | Freq: Once | INTRAMUSCULAR | Status: AC
  Administered 2020-07-04: 125 mg via INTRAVENOUS
  Filled 2020-07-04: qty 2

## 2020-07-04 MED ORDER — IOHEXOL 350 MG/ML SOLN
100.0000 mL | Freq: Once | INTRAVENOUS | Status: AC | PRN
  Administered 2020-07-04: 100 mL via INTRAVENOUS

## 2020-07-04 MED ORDER — PREDNISONE 10 MG PO TABS: 40.0000 mg | ORAL_TABLET | Freq: Every day | ORAL | 0 refills | Status: AC

## 2020-07-04 NOTE — ED Provider Notes (Signed)
[facility] Emergency Department Provider Note  ____________________________________________   Event Date/Time   First MD Initiated Contact with Patient 07/04/20 1834     (approximate)  I have reviewed the triage vital signs and the nursing notes.   HISTORY  Chief Complaint Shortness of Breath and Chest Pain    HPI Sergio Garcia is a 45 y.o. male with anxiety, Rosanna Randy syndrome who comes in for chest pain shortness of breath.  Patient states that he had sudden onset of chest pain and shortness of breath a few hours ago.  States it has been severe, constant, nothing makes it better, nothing makes it worse.  Patient states that it feels similar to when he had a blood clot.  To note on review of records patient had recent CT scan that showed a subsegmental left-sided PE as well as DVT.  Most likely secondary to testosterone use.  Patient was started on heparin and transition to oral Eliquis.  To note patient has had his gallbladder removed.          Past Medical History:  Diagnosis Date  . Anxiety   . Depression   . Enteritis   . Gastritis   . GERD (gastroesophageal reflux disease)   . Gilbert's syndrome    hyperbilirubinemia  . IBS (irritable bowel syndrome)   . Inguinal hernia   . Insomnia   . Pre-diabetes   . Prostatitis 2008    Patient Active Problem List   Diagnosis Date Noted  . Acute deep vein thrombosis (DVT) of proximal vein of left lower extremity ([place]) 06/22/2020  . Pulmonary embolism ([place]) 06/18/2020  . Elevated blood pressure reading 06/18/2020  . No-show for appointment 06/09/2020  . PSVT (paroxysmal supraventricular tachycardia) ([place]) 12/26/2019  . Bradycardia 12/26/2019  . Hyperlipidemia 12/19/2019  . Panic attacks 11/21/2019  . Mucous retention cyst of maxillary sinus 02/21/2019  . Weight loss, non-intentional 02/21/2019  . Generalized anxiety disorder 02/14/2019  . Prediabetes 01/29/2019  . PTSD (post-traumatic  stress disorder) 01/23/2019  . 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 Elevated bilirubin 03/07/2018  . Palpitations 03/07/2018  . PVC's (premature ventricular contractions) 03/07/2018  . Back pain 10/06/2016  . Routine general medical examination at a health care facility 06/08/2015  . Exertional chest pain 10/07/2014  . Anxiety 01/23/2014  . Elevated LFTs 07/26/2013  . Chronic prostatitis 02/26/2013  . Orchalgia 02/26/2013  . Other procreative management counseling and advice 02/26/2013  . Shortness of breath 12/25/2012  . GERD (gastroesophageal reflux disease) 12/20/2012  . Chest pain 12/18/2012  . 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 Inguinal hernia, right 10/01/2009  . GILBERT'S SYNDROME 09/28/2009  . Irritable bowel syndrome with diarrhea 09/28/2009  . 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 ALLERGIC RHINITIS 08/30/2007    Past Surgical History:  Procedure Laterality Date  . BACK SURGERY     2 ruptured discs  . CHOLECYSTECTOMY  01/31/2014   GB polyp, chronic cholecystitis   . ESOPHAGOGASTRODUODENOSCOPY  09/2004   gastritis acute and duodenitis  . ESOPHAGOGASTRODUODENOSCOPY  01-14-14   Dr Allen Norris  . EXPLORATORY LAPAROTOMY  Jan 2015   Dr Burt Knack  . LASIK    . RIGHT/LEFT HEART CATH AND CORONARY ANGIOGRAPHY N/A 10/18/2019   Procedure: RIGHT/LEFT HEART CATH AND CORONARY ANGIOGRAPHY;  Surgeon: Wellington Hampshire, MD;  Location: [facility];  Service: Cardiovascular;  Laterality: N/A;    Prior to Admission medications   Medication Sig Start Date End Date Taking? Authorizing  Provider  albuterol (VENTOLIN HFA) 108 (90 Base) MCG/ACT inhaler Inhale 1-2 puffs into the lungs every 6 (six) hours as needed for wheezing or shortness of breath. 06/17/20   [name], [name], MD  apixaban (ELIQUIS) 5 MG TABS tablet Take 1 tablet (5 mg total) by mouth 2 (two) times daily. 06/26/20   Shawna Clamp, MD  cetirizine (ZYRTEC) 10 MG tablet Take 10 mg by  mouth daily.    [provider]  clonazePAM (KLONOPIN) 0.5 MG tablet TAKE 1 TABLET (0.5 MG TOTAL) BY MOUTH 2 TIMES DAILY AS NEEDED FOR ANXIETY. 04/21/20   Ursula Alert, MD  Dextromethorphan-Guaifenesin 60-1200 MG 12hr tablet Take 1 tablet by mouth every 12 (twelve) hours. 06/26/20   [name], [name], MD  FLUoxetine (PROZAC) 40 MG capsule Take 1 capsule (40 mg total) by mouth daily. 05/05/20   Ursula Alert, MD  fluticasone (FLONASE) 50 MCG/ACT nasal spray Place 2 sprays into both nostrils daily. 06/26/20   [name], [name], MD  meclizine (ANTIVERT) 12.5 MG tablet Take 1-2 tablets (12.5-25 mg total) by mouth 2 (two) times daily as needed for dizziness. Patient not taking: Reported on 07/03/2020 07/01/20   [name], [name], MD  ondansetron (ZOFRAN) 4 MG tablet Take 1 tablet (4 mg total) by mouth every 8 (eight) hours as needed for nausea or vomiting. Patient not taking: Reported on 07/03/2020 06/17/20   [name], [name], MD  pantoprazole (PROTONIX) 40 MG tablet Take 1 tablet (40 mg total) by mouth daily. 30 min before food 12/19/19   [name], [name], MD  QUEtiapine (SEROQUEL) 50 MG tablet TAKE 1 TABLET BY MOUTH EVERYDAY AT BEDTIME 03/05/20   Ursula Alert, MD  sodium chloride (OCEAN) 0.65 % SOLN nasal spray Place 1 spray into both nostrils as needed for congestion. 06/26/20   [name], [name], MD  SUMAtriptan (IMITREX) 50 MG tablet Take 1 tablet (50 mg total) by mouth as needed for migraine. May repeat in 2 hours if headache persists or recurs. No more than 2x per week. No more than 200 mg total/24 hours 02/21/19   [name], [name], MD    Allergies Morphine and related, Testosterone, Morphine, and Tdap [tetanus-diphth-acell pertussis]  Family History  Problem Relation Age of Onset  . Colon polyps Mother   . Cancer Mother        sinus cavity, on XRT  . Asthma Mother   . Heart disease Father   . Heart attack Father        x 2  .  Pancreatic cancer Maternal Grandfather   . Colon cancer Maternal Grandfather   . Diabetes Other        strong FH d both sides of family   . Heart Problems Paternal Grandfather   . Asthma Brother   . Cancer Maternal Uncle   . Stroke Cousin        age 71/44  . Mental illness Neg Hx     Social History Social History   Tobacco Use  . Smoking status: Never Smoker  . Smokeless tobacco: Never Used  Vaping Use  . Vaping Use: Never used  Substance Use Topics  . Alcohol use: Yes    Comment: occassional social  . Drug use: No      Review of Systems Constitutional: No fever/chills Eyes: No visual changes. ENT: No sore throat. Cardiovascular: Positive chest pain Respiratory: Positive shortness of breath Gastrointestinal: No abdominal pain.  No nausea, no vomiting.  No diarrhea.  No constipation. Genitourinary: Negative for  dysuria. Musculoskeletal: Negative for back pain. Skin: Negative for rash. Neurological: Negative for headaches, focal weakness or numbness. All other ROS negative ____________________________________________   PHYSICAL EXAM:  VITAL SIGNS: Blood pressure (!) 147/106, pulse 92, temperature 98.4 F (36.9 C), temperature source Oral, resp. rate (!) 40, height 6\' 2"  (1.88 m), weight 95.3 kg, SpO2 100 %.  Constitutional: Alert and oriented.  Appears in pain Eyes: Conjunctivae are normal. EOMI. Head: Atraumatic. Nose: No congestion/rhinnorhea. Mouth/Throat: Mucous membranes are moist.   Neck: No stridor. Trachea Midline. FROM Cardiovascular: Normal rate, regular rhythm. Grossly normal heart sounds.  Good peripheral circulation. Respiratory: Increased work of no retractions. Lungs CTAB. Gastrointestinal: Soft and nontender. No distention. No abdominal bruits.  Musculoskeletal: No lower extremity tenderness nor edema.  No joint effusions. Neurologic:  Normal speech and language. No gross focal neurologic deficits are appreciated.  Skin:  Skin is warm, dry and  intact. No rash noted. Psychiatric: Mood and affect are normal. Speech and behavior are normal. GU: Deferred   ____________________________________________   LABS (all labs ordered are listed, but only abnormal results are displayed)  Labs Reviewed  RESP PANEL BY RT-PCR (FLU A&B, COVID) ARPGX2 - Abnormal; Notable for the following components:      Result Value   SARS Coronavirus 2 by RT PCR POSITIVE (*)    All other components within normal limits  BASIC METABOLIC PANEL - Abnormal; Notable for the following components:   Potassium 3.4 (*)    CO2 20 (*)    Glucose, Bld 144 (*)    Anion gap 16 (*)    All other components within normal limits  HEPATIC FUNCTION PANEL - Abnormal; Notable for the following components:   AST 56 (*)    Total Bilirubin 1.3 (*)    Indirect Bilirubin 1.2 (*)    All other components within normal limits  CBC WITH DIFFERENTIAL/PLATELET  LIPASE, BLOOD  BRAIN NATRIURETIC PEPTIDE  TROPONIN I (HIGH SENSITIVITY)  TROPONIN I (HIGH SENSITIVITY)   ____________________________________________   ED ECG REPORT I, Vanessa Albion, the attending physician, personally viewed and interpreted this ECG.  Normal sinus rate ninety-three, no ST elevation, T wave inversion/maybe some flattening in two three aVF, QTC is 481 ____________________________________________  RADIOLOGY Robert Bellow, personally viewed and evaluated these images (plain radiographs) as part of my medical decision making, as well as reviewing the written report by the radiologist.  ED MD interpretation: No pneumonia  Official radiology report(s): CT Angio Chest PE W and/or Wo Contrast  Result Date: 07/04/2020 CLINICAL DATA:  Patient with recent PE, seen on exam dated 06/18/2020. Experiencing similar left-sided chest pain today. EXAM: CT ANGIOGRAPHY CHEST WITH CONTRAST TECHNIQUE: Multidetector CT imaging of the chest was performed using the standard protocol during bolus administration of  intravenous contrast. Multiplanar CT image reconstructions and MIPs were obtained to evaluate the vascular anatomy. CONTRAST:  110mL OMNIPAQUE IOHEXOL 350 MG/ML SOLN COMPARISON:  06/18/2020 FINDINGS: Cardiovascular: Pulmonary arteries well opacified. No evidence of a pulmonary embolism. Segmental pulmonary emboli to the left lower lobe and left upper lobe lingula the prior exam are no longer visualized. Heart is normal in size and configuration. No pericardial effusion or coronary artery calcifications. Great vessels are normal in caliber. No aortic dissection or atherosclerosis. Mediastinum/Nodes: No enlarged mediastinal, hilar, or axillary lymph nodes. Thyroid gland, trachea, and esophagus demonstrate no significant findings. Lungs/Pleura: Lungs are clear. No pleural effusion or pneumothorax. Upper Abdomen: No acute findings.  Status post cholecystectomy. Musculoskeletal: Normal. Review of the  MIP images confirms the above findings. IMPRESSION: 1. Normal exam. No evidence of a pulmonary embolism. Previously seen left lower lobe and left upper lobe lingular segmental pulmonary emboli have resolved. Electronically Signed   By: Lajean Manes M.D.   On: 07/04/2020 19:41   DG Chest Portable 1 View  Result Date: 07/04/2020 CLINICAL DATA:  Sudden onset of shortness of breath.  Chest pain. EXAM: PORTABLE CHEST 1 VIEW COMPARISON:  Radiograph and CTA 2 weeks ago 06/17/2020 FINDINGS: The cardiomediastinal contours are normal. The lungs are clear. Pulmonary vasculature is normal. No consolidation, pleural effusion, or pneumothorax. No acute osseous abnormalities are seen. IMPRESSION: No acute chest findings. Electronically Signed   By: Keith Rake M.D.   On: 07/04/2020 19:21    ____________________________________________   PROCEDURES  Procedure(s) performed (including Critical Care):  .1-3 Lead EKG Interpretation Performed by: Vanessa [place], MD Authorized by: Vanessa [place], MD     Interpretation:  normal     ECG rate:  70s   ECG rate assessment: normal     Rhythm: sinus rhythm     Ectopy: none     Conduction: normal       ____________________________________________   INITIAL IMPRESSION / ASSESSMENT AND PLAN / ED COURSE   Sergio Garcia was evaluated in Emergency Department on 07/04/2020 for the symptoms described in the history of present illness. He was evaluated in the context of the global COVID-19 pandemic, which necessitated consideration that the patient might be at risk for infection with the SARS-CoV-2 virus that causes COVID-19. Institutional protocols and algorithms that pertain to the evaluation of patients at risk for COVID-19 are in a state of rapid change based on information released by regulatory bodies including the CDC and federal and state organizations. These policies and algorithms were followed during the patient's care in the ED.    Most Likely DDx:  -Patient comes in with chest pain in the setting of recent PE.  Will get repeat CT PE to make sure no evidence of pulmonary embolism lightheadedness present EKG evaluate for ACS.  We will keep patient on cardiac monitor.  Patient has been compliant with his blood thinner and denies continue the testosterone.  DDx that was also considered d/t potential to cause harm, but was found less likely based on history and physical (as detailed above): -PNA (no fevers, cough but CXR to evaluate) -PNX (reassured with equal b/l breath sounds, CXR to evaluate) -Symptomatic anemia (will get H&H) -Aortic Dissection as no tearing pain and no radiation to the mid back, pulses equal -Pericarditis no rub on exam, EKG changes or hx to suggest dx -Tamponade (no notable SOB, tachycardic, hypotensive) -Esophageal rupture (no h/o diffuse vomitting/no crepitus)  EKG without evidence of STEMI.  CT PE does not show evidence of PE.  Patient's Covid swab was positive.  Cardiac markers were negative x2.  Patient's pain seems to resolve.   Suspect that this is most likely secondary to his COVID.  Patient does report having Covid-like symptoms for the past week and a half.  Patient is vaccinated.  Patient is oxygen level stays between 94-97% on in the room.  He does not go below 92% with ambulation without significant work of breathing.  His CT did not show significant COVID pneumonia at this time.  We discussed antibiotics and steroids.  Patient also does report a history of anxiety but he has not been taking his Klonopin because he ran out.  He will call his doctor on  Monday to get refill.  He understands that if his shortness of breath gets any worse that he should return to the ER for repeat oxygen check.  He feels comfortable with this plan and will be discharged home.        ____________________________________________   FINAL CLINICAL IMPRESSION(S) / ED DIAGNOSES   Final diagnoses:  Pneumonia due to COVID-19 virus     MEDICATIONS GIVEN DURING THIS VISIT:  Medications  HYDROmorphone (DILAUDID) injection 0.5 mg (0.5 mg Intravenous Given 07/04/20 1850)  ondansetron (ZOFRAN) injection 4 mg (4 mg Intravenous Given 07/04/20 1850)  iohexol (OMNIPAQUE) 350 MG/ML injection 100 mL (100 mLs Intravenous Contrast Given 07/04/20 1923)  methylPREDNISolone sodium succinate (SOLU-MEDROL) 125 mg/2 mL injection 125 mg (125 mg Intravenous Given 07/04/20 2206)     ED Discharge Orders         Ordered    predniSONE (DELTASONE) 10 MG tablet  Daily        07/04/20 2235    azithromycin (ZITHROMAX Z-PAK) 250 MG tablet        07/04/20 2235    Ambulatory referral for Covid Treatment        07/04/20 2236           Note:  This document was prepared using Dragon voice recognition software and may include unintentional dictation errors.   Vanessa I[place]quois, MD 07/04/20 2236

## 2020-07-04 NOTE — ED Triage Notes (Signed)
Pt arrived via EMS for CP and shortness of breath, duration approx 1 week, progressively worsening 1.5 hours ago.  Pt currently on eliquis for hx of blood clots in left lung and left leg.

## 2020-07-04 NOTE — Discharge Instructions (Addendum)
You have COVID which is mostly causing your shortness of breath.  Your CT scan was negative.  Take the antibiotics and steroids to help prevent worsening disease.  Stay well-hydrated.  Return the ER if you develop worsening shortness of breath or any other concerns.

## 2020-07-05 LAB — SARS-COV-2, NAA 2 DAY TAT

## 2020-07-05 LAB — NOVEL CORONAVIRUS, NAA: SARS-CoV-2, NAA: DETECTED — AB

## 2020-07-08 ENCOUNTER — Ambulatory Visit: Payer: Self-pay | Admitting: Licensed Clinical Social Worker

## 2020-07-14 ENCOUNTER — Other Ambulatory Visit: Payer: Self-pay | Admitting: Internal Medicine

## 2020-07-14 DIAGNOSIS — G43001 Migraine without aura, not intractable, with status migrainosus: Secondary | ICD-10-CM

## 2020-07-15 ENCOUNTER — Ambulatory Visit: Payer: BC Managed Care – PPO | Admitting: Internal Medicine

## 2020-07-15 ENCOUNTER — Ambulatory Visit: Payer: BC Managed Care – PPO | Admitting: Licensed Clinical Social Worker

## 2020-07-15 ENCOUNTER — Other Ambulatory Visit: Payer: Self-pay

## 2020-07-17 ENCOUNTER — Ambulatory Visit: Payer: BC Managed Care – PPO | Admitting: Pharmacist

## 2020-07-17 DIAGNOSIS — I2699 Other pulmonary embolism without acute cor pulmonale: Secondary | ICD-10-CM

## 2020-07-17 DIAGNOSIS — F419 Anxiety disorder, unspecified: Secondary | ICD-10-CM

## 2020-07-17 MED ORDER — RIVAROXABAN 20 MG PO TABS: 20.0000 mg | ORAL_TABLET | Freq: Every day | ORAL | 1 refills | Status: DC

## 2020-07-17 NOTE — Chronic Care Management (AMB) (Signed)
Care Management   Pharmacy Note  07/17/2020 Name: Sergio Garcia MRN: [id number] DOB: 03-Oct-1975  Subjective: Sergio Garcia is a 45 y.o. year old male who is a primary care patient of [name], [name], MD. The Care Management team was consulted for assistance with care management and care coordination needs.    Engaged with patient by telephone for follow up visit in response to provider referral for pharmacy case management and/or care coordination services.   The patient was given information about Care Management services today including:  1. Care Management services includes personalized support from designated clinical staff supervised by the patient's primary care provider, including individualized plan of care and coordination with other care providers. 2. 24/7 contact phone numbers for assistance for urgent and routine care needs. 3. The patient may stop case management services at any time by phone call to the office staff.  Patient agreed to services and consent obtained.  Assessment:  Review of patient status, including review of consultants reports, laboratory and other test data, was performed as part of comprehensive evaluation and provision of chronic care management services.   SDOH (Social Determinants of Health) assessments and interventions performed:    Objective:  Lab Results  Component Value Date   CREATININE 1.09 07/04/2020   CREATININE 1.06 06/19/2020   CREATININE 1.03 06/18/2020    Lab Results  Component Value Date   HGBA1C 6.0 12/19/2019       Component Value Date/Time   CHOL 183 12/19/2019 0901   CHOL 173 01/24/2019 0817   TRIG 90.0 12/19/2019 0901   HDL 41.40 12/19/2019 0901   HDL 43 01/24/2019 0817   CHOLHDL 4 12/19/2019 0901   VLDL 18.0 12/19/2019 0901   LDLCALC 124 (H) 12/19/2019 0901   LDLCALC 108 (H) 01/24/2019 0817    Other: (TSH, CBC, Vit D, etc.)  Clinical ASCVD: No  The 10-year ASCVD risk score [name], et al.,  2013) is: 1.4%   Values used to calculate the score:     Age: 40 years     Sex: Male     Is Non-Hispanic African American: No     Diabetic: No     Tobacco smoker: No     Systolic Blood Pressure: 379 mmHg     Is BP treated: No     HDL Cholesterol: 41.4 mg/dL     Total Cholesterol: 183 mg/dL     BP Readings from Last 3 Encounters:  07/04/20 (!) 122/93  06/26/20 110/78  06/26/20 124/88    Care Plan  Allergies  Allergen Reactions  . Morphine And Related Anaphylaxis  . Testosterone     Dvt/PE  . Morphine Nausea And Vomiting  . Tdap [Tetanus-Diphth-Acell Pertussis] Hives and Rash    Medications Reviewed Today    Reviewed by De Hollingshead, RPH-CPP (Pharmacist) on 07/17/20 at 1352  Med List Status: <None>  Medication Order Taking? Sig Documenting Provider Last Dose Status Informant  albuterol (VENTOLIN HFA) 108 (90 Base) MCG/ACT inhaler [id number] No Inhale 1-2 puffs into the lungs every 6 (six) hours as needed for wheezing or shortness of breath.  Patient not taking: Reported on 07/17/2020   [name], [name], MD Not Taking Active Self  apixaban (ELIQUIS) 5 MG TABS tablet [id number] Yes Take 1 tablet (5 mg total) by mouth 2 (two) times daily. Shawna Clamp, MD Taking Active   cetirizine (ZYRTEC) 10 MG tablet [id number] Yes Take 10 mg by mouth daily. [provider] Taking Active  clonazePAM (KLONOPIN) 0.5 MG tablet [id number] Yes TAKE 1 TABLET (0.5 MG TOTAL) BY MOUTH 2 TIMES DAILY AS NEEDED FOR ANXIETY. Ursula Alert, MD Taking Active Self           Med Note De Hollingshead   Fri Jul 03, 2020 11:17 AM) Using PRN  FLUoxetine (PROZAC) 40 MG capsule [id number] Yes Take 1 capsule (40 mg total) by mouth daily. Ursula Alert, MD Taking Active Self  fluticasone (FLONASE) 50 MCG/ACT nasal spray [id number] Yes Place 2 sprays into both nostrils daily. [name], [name], MD Taking Active   meclizine (ANTIVERT) 12.5 MG tablet [id number] Yes Take 1-2 tablets  (12.5-25 mg total) by mouth 2 (two) times daily as needed for dizziness. [name], [name], MD Taking Active   ondansetron (ZOFRAN) 4 MG tablet [id number] No Take 1 tablet (4 mg total) by mouth every 8 (eight) hours as needed for nausea or vomiting.  Patient not taking: No sig reported   [name], [name], MD Not Taking Active   pantoprazole (PROTONIX) 40 MG tablet [id number] Yes Take 1 tablet (40 mg total) by mouth daily. 30 min before food [name], [name], MD Taking Active Self  QUEtiapine (SEROQUEL) 50 MG tablet [id number] Yes TAKE 1 TABLET BY MOUTH EVERYDAY AT BEDTIME Eappen, Ria Clock, MD Taking Active Self  sodium chloride (OCEAN) 0.65 % SOLN nasal spray [id number] Yes Place 1 spray into both nostrils as needed for congestion. [name], [name], MD Taking Active   SUMAtriptan (IMITREX) 50 MG tablet [id number] Yes TAKE 1 TABLET AS NEEDED FOR MIGRAINE-MAY REPEAT IN 2 HR IF HEADACHE PERSISTS(MAX 2/DAY) [name], [name], MD Taking Active           Patient Active Problem List   Diagnosis Date Noted  . Acute deep vein thrombosis (DVT) of proximal vein of left lower extremity ([place]) 06/22/2020  . Pulmonary embolism ([place]) 06/18/2020  . Elevated blood pressure reading 06/18/2020  . No-show for appointment 06/09/2020  . PSVT (paroxysmal supraventricular tachycardia) ([place]) 12/26/2019  . Bradycardia 12/26/2019  . Hyperlipidemia 12/19/2019  . Panic attacks 11/21/2019  . Mucous retention cyst of maxillary sinus 02/21/2019  . Weight loss, non-intentional 02/21/2019  . Generalized anxiety disorder 02/14/2019  . Prediabetes 01/29/2019  . PTSD (post-traumatic stress disorder) 01/23/2019  . 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 Elevated bilirubin 03/07/2018  . Palpitations 03/07/2018  . PVC's (premature ventricular contractions) 03/07/2018  . Back pain 10/06/2016  . Routine general medical examination at a health care facility 06/08/2015  . Exertional chest  pain 10/07/2014  . Anxiety 01/23/2014  . Elevated LFTs 07/26/2013  . Chronic prostatitis 02/26/2013  . Orchalgia 02/26/2013  . Other procreative management counseling and advice 02/26/2013  . Shortness of breath 12/25/2012  . GERD (gastroesophageal reflux disease) 12/20/2012  . Chest pain 12/18/2012  . 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 Inguinal hernia, right 10/01/2009  . GILBERT'S SYNDROME 09/28/2009  . Irritable bowel syndrome with diarrhea 09/28/2009  . 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 ALLERGIC RHINITIS 08/30/2007    Conditions to be addressed/monitored: Anxiety and hx PE  Care Plan : Medication Management  Updates made by De Hollingshead, RPH-CPP since 07/17/2020 12:00 AM  Completed 07/17/2020  Problem: VTE (DVT/PE), Anxiety Resolved 07/17/2020    Long-Range Goal: Disease Progression Prevention Completed 07/17/2020  Start Date: 07/03/2020  Recent Progress: On track  Priority: High  Note:   Current Barriers:  . Unable to independently afford treatment regimen  Pharmacist Clinical Goal(s):  .  Over the next 90 days, patient will verbalize ability to afford treatment regimen through collaboration with PharmD and provider.   Interventions: . 1:1 collaboration with [name], [name], MD regarding development and update of comprehensive plan of care as evidenced by provider attestation and co-signature . Inter-disciplinary care team collaboration (see longitudinal plan of care) . Comprehensive medication review performed; medication list updated in electronic medical record  Recent provoked DVT/PE (05/2020): Marland Kitchen Suspected to have been provoked by use of old testosterone prescription; current treatment: Eliquis 5 mg BID. Notes that he has about 1 week of therapy remaining . Procoagulable state work up has been negative thus far. Per hospital discharge, plan for treatment for 6-9 months.  . Given insurance preference for Xarelto, complete  Eliquis supply then start Xarelto 20 mg daily. Counseled to take once daily after a large meal, which would be after supper for him.  Durene Cal script, contacted pharmacy. Copay is $90 for 90 days. Provided patient with information about Xarelto savings card to bring copay down to $10 for 90 days.   Anxiety/PTSD . Moderately well controlled; current treatment: fluoxetine 40 mg daily, quetiapine 50 mg QPM; clonazepam PRN - patient notes that he has required clonazepam twice this week when winding down for bed . Follows w/ Dr. Shea Evans, due for follow up . Recommend to continue current regimen at this time.   Acute Respiratory Infection, Dizziness: . S/p COVID. Reports he saw ENT yesterday, no evidence of sinus infection. Patient reports that he was told his continued congestion symptoms and headaches are post-COVID sequela. Current treatment: cetirizine 10 mg PRN, fluticasone nasal PRN, nasal saline, fluticasone nasal spray . Patient reports he had allergy blood testing, is awaiting results.  . Encouraged to continue to follow Dr. Ileene Hutchinson recommendation for treatment and follow up.   Patient Goals/Self-Care Activities . Over the next 30 days, patient will:  - collaborate with provider on medication access solutions  Follow Up Plan: Goals of care achieved. Closing CCM case at this time      Medication Assistance:  None required.  Patient affirms current coverage meets needs.  Follow Up:  Goals of Care Met.   Plan: Goals of care met. Closing CCM case at this time  Catie Darnelle Maffucci, PharmD, [place], [place] Clinical Pharmacist [employer] at [place]

## 2020-07-17 NOTE — Patient Instructions (Signed)
Visit Information  Goals Addressed              This Visit's Progress     Patient Stated   .  COMPLETED: Medication Management (pt-stated)        Patient Goals/Self-Care Activities . Over the next 30 days, patient will:  - collaborate with provider on medication access solutions        Patient verbalizes understanding of instructions provided today and agrees to view in [place].   Plan: Goals of care met. Closing CCM case at this time  Catie Darnelle Maffucci, PharmD, [place], [place] Clinical Pharmacist [employer] at [place]

## 2020-07-28 ENCOUNTER — Other Ambulatory Visit: Payer: Self-pay | Admitting: Internal Medicine

## 2020-07-28 DIAGNOSIS — R112 Nausea with vomiting, unspecified: Secondary | ICD-10-CM

## 2020-08-07 ENCOUNTER — Telehealth: Payer: Self-pay

## 2020-08-07 DIAGNOSIS — F4310 Post-traumatic stress disorder, unspecified: Secondary | ICD-10-CM

## 2020-08-07 DIAGNOSIS — F5105 Insomnia due to other mental disorder: Secondary | ICD-10-CM

## 2020-08-07 MED ORDER — QUETIAPINE FUMARATE 50 MG PO TABS: ORAL_TABLET | ORAL | 1 refills | Status: DC

## 2020-08-07 NOTE — Telephone Encounter (Signed)
I have sent Seroquel to pharmacy. 

## 2020-08-07 NOTE — Telephone Encounter (Signed)
Pt called states he needs refills on his seroquel

## 2020-08-17 ENCOUNTER — Ambulatory Visit (INDEPENDENT_AMBULATORY_CARE_PROVIDER_SITE_OTHER): Payer: Self-pay | Admitting: Licensed Clinical Social Worker

## 2020-08-17 ENCOUNTER — Other Ambulatory Visit: Payer: Self-pay

## 2020-08-17 ENCOUNTER — Encounter: Payer: Self-pay | Admitting: Licensed Clinical Social Worker

## 2020-08-17 DIAGNOSIS — F4310 Post-traumatic stress disorder, unspecified: Secondary | ICD-10-CM

## 2020-08-17 DIAGNOSIS — R079 Chest pain, unspecified: Secondary | ICD-10-CM

## 2020-08-17 DIAGNOSIS — F411 Generalized anxiety disorder: Secondary | ICD-10-CM

## 2020-08-17 NOTE — Progress Notes (Signed)
Virtual Visit via Video Note  I connected with Sergio Garcia on 08/17/20 at  2:00 PM EDT by a video enabled telemedicine application and verified that I am speaking with the correct person using two identifiers.  Participating Parties Patient Provider  Location: Patient: Vehicle Provider: Home Office   I discussed the limitations of evaluation and management by telemedicine and the availability of in person appointments. The patient expressed understanding and agreed to proceed.  THERAPY PROGRESS NOTE  Session Time: 30 Minutes  Participation Level: Active  Behavioral Response: Well GroomedAlertEuthymic  Type of Therapy: Individual Therapy  Treatment Goals addressed: Coping  Interventions: CBT  Summary: Sergio Garcia is a 45 y.o. male who presents with minimal PTSD and anxiety sxs. Pt reported overall improvement in mood and decrease in stressors around work life. Pt reported he loves the work he does and feels supported by co-workers and Librarian, academic. Pt continues to experience health issues and is now on blood thinners. Pt under care of his cardiologist. Pt reported he has stopped going to gym since experiencing blood clots. Pt reported he is better able to manage stress at work and supervisor has voiced noticing "a big change in a positive way". Pt reported he does not look forward to going home and continues to have an on-again, off-again relationship with girlfriend, but "we don't live together". Pt reported occasional alcohol use s/ "I had two beers last night, but it was the first time in a month". Pt denied use as problematic at this time. Pt reported sleep improved while on Seroquel.  Suicidal/Homicidal: No  Therapist Response: Therapist met with patient for follow up. Therapist and patient reviewed PHQ2-9, C-SRSS, nutrition and pain assessments. Therapist and patient explored current sxs and ways of managing stress. Therapist informed patient regarding initial phase of  terminating services with this therapist due to leaving the practice in the next 4 weeks. Therapist and patient discussed options for continued care.  Plan: Return again in 1 week.  Diagnosis: Axis I: Generalized Anxiety Disorder, Post Traumatic Stress Disorder and Chest Pain unrelated to panic attacks    Axis II: N/A  Josephine Igo, LCSW, LCAS 08/17/2020

## 2020-08-26 ENCOUNTER — Other Ambulatory Visit: Payer: Self-pay

## 2020-08-26 ENCOUNTER — Ambulatory Visit (INDEPENDENT_AMBULATORY_CARE_PROVIDER_SITE_OTHER): Payer: Self-pay | Admitting: Licensed Clinical Social Worker

## 2020-08-26 ENCOUNTER — Encounter: Payer: Self-pay | Admitting: Licensed Clinical Social Worker

## 2020-08-26 DIAGNOSIS — R079 Chest pain, unspecified: Secondary | ICD-10-CM

## 2020-08-26 DIAGNOSIS — F411 Generalized anxiety disorder: Secondary | ICD-10-CM

## 2020-08-26 DIAGNOSIS — F4310 Post-traumatic stress disorder, unspecified: Secondary | ICD-10-CM

## 2020-08-26 NOTE — Progress Notes (Signed)
Virtual Visit via Video Note  I connected with [name] on 08/26/20 at  9:00 AM EDT by a video enabled telemedicine application and verified that I am speaking with the correct person using two identifiers.  Participating Parties Patient Provider  Location: Patient: Vehicle at [facility]: Home Office   I discussed the limitations of evaluation and management by telemedicine and the availability of in person appointments. The patient expressed understanding and agreed to proceed.  THERAPY PROGRESS NOTE  Session Time: 25 Minutes  Participation Level: Active  Behavioral Response: Well GroomedAlertEuthymic  Type of Therapy: Individual Therapy  Treatment Goals addressed: Coping  Interventions: CBT  Summary: Sergio Garcia is a 45 y.o. male who presents with minimal PTSD and anxiety sxs. Pt reported "everything is going good" at work and at home. Pt acknowledged that keeping busy and getting outside the house is a large contributor to positive mood and the discomfort of being alone at times due to intrusive thoughts and flashbacks. Pt reported "they are mainly about my grandfather who passed away in 2019/04/20". Pt reported he focuses on the positive memories and does not find these thoughts distressing at this time. Pt reported some relationship difficulties around communicating needs for space while also giving the relationship a chance to grow. Pt reported noticing positive changes in girlfriend "making an effort" by seeing a counselor and obtaining steady work. Pt reported he continues to follow doctor recommendations by taking blood thinners and avoiding anything too strenuous. Pt reported he notices "feeling cold," however c/o no other side effects at this time. Pt denied any other concerns and agreed to follow up for last session with this therapist in 2 weeks.  Suicidal/Homicidal: No  Therapist Response: Therapist met with patient for follow up. Therapist and  patient explored current stressors and attempts to cope. Therapist validated patient feelings/concerns. Therapist and patient discussed communication issues with girlfriend and therapist provided feedback. Pt was receptive.  Plan: Return again in 2 weeks.  Diagnosis: Axis I: Generalized Anxiety Disorder, Post Traumatic Stress Disorder and Chest Pain unrelated to panic attacks    Axis II: N/A  Josephine Igo, LCSW, LCAS 08/26/2020

## 2020-08-31 ENCOUNTER — Encounter: Payer: Self-pay | Admitting: Psychiatry

## 2020-08-31 ENCOUNTER — Other Ambulatory Visit: Payer: Self-pay

## 2020-08-31 ENCOUNTER — Telehealth (INDEPENDENT_AMBULATORY_CARE_PROVIDER_SITE_OTHER): Payer: Self-pay | Admitting: Psychiatry

## 2020-08-31 ENCOUNTER — Other Ambulatory Visit: Payer: Self-pay | Admitting: Psychiatry

## 2020-08-31 ENCOUNTER — Other Ambulatory Visit: Payer: Self-pay | Admitting: Internal Medicine

## 2020-08-31 DIAGNOSIS — F410 Panic disorder [episodic paroxysmal anxiety] without agoraphobia: Secondary | ICD-10-CM

## 2020-08-31 DIAGNOSIS — F411 Generalized anxiety disorder: Secondary | ICD-10-CM

## 2020-08-31 DIAGNOSIS — R42 Dizziness and giddiness: Secondary | ICD-10-CM

## 2020-08-31 DIAGNOSIS — F5105 Insomnia due to other mental disorder: Secondary | ICD-10-CM

## 2020-08-31 DIAGNOSIS — F4310 Post-traumatic stress disorder, unspecified: Secondary | ICD-10-CM

## 2020-08-31 MED ORDER — QUETIAPINE FUMARATE 50 MG PO TABS: ORAL_TABLET | ORAL | 1 refills | Status: DC

## 2020-08-31 MED ORDER — TRAZODONE HCL 50 MG PO TABS: 25.0000 mg | ORAL_TABLET | Freq: Every evening | ORAL | 1 refills | Status: DC | PRN

## 2020-08-31 MED ORDER — FLUOXETINE HCL 40 MG PO CAPS: 40.0000 mg | ORAL_CAPSULE | Freq: Every day | ORAL | 0 refills | Status: DC

## 2020-08-31 NOTE — Patient Instructions (Signed)
Trazodone Tablets What is this medicine? TRAZODONE (TRAZ oh done) is used to treat depression. This medicine may be used for other purposes; ask your health care provider or pharmacist if you have questions. COMMON BRAND NAME(S): Desyrel What should I tell my health care provider before I take this medicine? They need to know if you have any of these conditions:  attempted suicide or thinking about it  bipolar disorder  bleeding problems  glaucoma  heart disease, or previous heart attack  irregular heart beat  kidney or liver disease  low levels of sodium in the blood  an unusual or allergic reaction to trazodone, other medicines, foods, dyes or preservatives  pregnant or trying to get pregnant  breast-feeding How should I use this medicine? Take this medicine by mouth with a glass of water. Follow the directions on the prescription label. Take this medicine shortly after a meal or a light snack. Take your medicine at regular intervals. Do not take your medicine more often than directed. Do not stop taking this medicine suddenly except upon the advice of your doctor. Stopping this medicine too quickly may cause serious side effects or your condition may worsen. A special MedGuide will be given to you by the pharmacist with each prescription and refill. Be sure to read this information carefully each time. Talk to your pediatrician regarding the use of this medicine in children. Special care may be needed. Overdosage: If you think you have taken too much of this medicine contact a poison control center or emergency room at once. NOTE: This medicine is only for you. Do not share this medicine with others. What if I miss a dose? If you miss a dose, take it as soon as you can. If it is almost time for your next dose, take only that dose. Do not take double or extra doses. What may interact with this medicine? Do not take this medicine with any of the following  medications:  certain medicines for fungal infections like fluconazole, itraconazole, ketoconazole, posaconazole, voriconazole  cisapride  dronedarone  linezolid  MAOIs like Carbex, Eldepryl, Marplan, Nardil, and Parnate  mesoridazine  methylene blue (injected into a vein)  pimozide  saquinavir  thioridazine This medicine may also interact with the following medications:  alcohol  antiviral medicines for HIV or AIDS  aspirin and aspirin-like medicines  barbiturates like phenobarbital  certain medicines for blood pressure, heart disease, irregular heart beat  certain medicines for depression, anxiety, or psychotic disturbances  certain medicines for migraine headache like almotriptan, eletriptan, frovatriptan, naratriptan, rizatriptan, sumatriptan, zolmitriptan  certain medicines for seizures like carbamazepine and phenytoin  certain medicines for sleep  certain medicines that treat or prevent blood clots like dalteparin, enoxaparin, warfarin  digoxin  fentanyl  lithium  NSAIDS, medicines for pain and inflammation, like ibuprofen or naproxen  other medicines that prolong the QT interval (cause an abnormal heart rhythm) like dofetilide  rasagiline  supplements like St. John's wort, kava kava, valerian  tramadol  tryptophan This list may not describe all possible interactions. Give your health care provider a list of all the medicines, herbs, non-prescription drugs, or dietary supplements you use. Also tell them if you smoke, drink alcohol, or use illegal drugs. Some items may interact with your medicine. What should I watch for while using this medicine? Tell your doctor if your symptoms do not get better or if they get worse. Visit your doctor or health care professional for regular checks on your progress. Because it may take   several weeks to see the full effects of this medicine, it is important to continue your treatment as prescribed by your  doctor. Patients and their families should watch out for new or worsening thoughts of suicide or depression. Also watch out for sudden changes in feelings such as feeling anxious, agitated, panicky, irritable, hostile, aggressive, impulsive, severely restless, overly excited and hyperactive, or not being able to sleep. If this happens, especially at the beginning of treatment or after a change in dose, call your health care professional. You may get drowsy or dizzy. Do not drive, use machinery, or do anything that needs mental alertness until you know how this medicine affects you. Do not stand or sit up quickly, especially if you are an older patient. This reduces the risk of dizzy or fainting spells. Alcohol may interfere with the effect of this medicine. Avoid alcoholic drinks. This medicine may cause dry eyes and blurred vision. If you wear contact lenses you may feel some discomfort. Lubricating drops may help. See your eye doctor if the problem does not go away or is severe. Your mouth may get dry. Chewing sugarless gum, sucking hard candy and drinking plenty of water may help. Contact your doctor if the problem does not go away or is severe. What side effects may I notice from receiving this medicine? Side effects that you should report to your doctor or health care professional as soon as possible:  allergic reactions like skin rash, itching or hives, swelling of the face, lips, or tongue  elevated mood, decreased need for sleep, racing thoughts, impulsive behavior  confusion  fast, irregular heartbeat  feeling faint or lightheaded, falls  feeling agitated, angry, or irritable  loss of balance or coordination  painful or prolonged erections  restlessness, pacing, inability to keep still  suicidal thoughts or other mood changes  tremors  trouble sleeping  seizures  unusual bleeding or bruising Side effects that usually do not require medical attention (report to your doctor  or health care professional if they continue or are bothersome):  change in sex drive or performance  change in appetite or weight  constipation  headache  muscle aches or pains  nausea This list may not describe all possible side effects. Call your doctor for medical advice about side effects. You may report side effects to FDA at 1-800-FDA-1088. Where should I keep my medicine? Keep out of the reach of children. Store at room temperature between 15 and 30 degrees C (59 to 86 degrees F). Protect from light. Keep container tightly closed. Throw away any unused medicine after the expiration date. NOTE: This sheet is a summary. It may not cover all possible information. If you have questions about this medicine, talk to your doctor, pharmacist, or health care provider.  2021 Elsevier/Gold Standard (2020-03-30 14:46:11)  

## 2020-08-31 NOTE — Progress Notes (Signed)
Virtual Visit via Video Note  I connected with Sergio Garcia on 08/31/20 at  8:30 AM EDT by a video enabled telemedicine application and verified that I am speaking with the correct person using two identifiers.  Location Provider Location : [facility] Patient Location : Car  Participants: Patient , Provider    I discussed the limitations of evaluation and management by telemedicine and the availability of in person appointments. The patient expressed understanding and agreed to proceed.   I discussed the assessment and treatment plan with the patient. The patient was provided an opportunity to ask questions and all were answered. The patient agreed with the plan and demonstrated an understanding of the instructions.   The patient was advised to call back or seek an in-person evaluation if the symptoms worsen or if the condition fails to improve as anticipated.   [place] MD OP Progress Note  08/31/2020 1:53 PM Sergio Garcia  MRN:  [id number]  Chief Complaint:  Chief Complaint    Follow-up; Anxiety; Depression     HPI: Sergio Garcia is a 45 year old male, employed, lives in [place], has a history of PTSD, panic attacks, insomnia, Gilbert's syndrome, elevated LFTs was evaluated by telemedicine today.  Patient today reports he is currently making progress with regards to his mood.  He has not had any panic attacks in the past few weeks.  He continues to have anxiety symptoms, he is a Research officer, trade union, worries about different things and feels he is unable to relax often.  Patient however reports overall he is coping okay and is not interested in making further medication changes.  He wants to stay on the Prozac at this dosage.  Patient reports he continues to follow-up with his therapist and that is working out well.  He reports sleep is overall okay.  Patient however takes Seroquel at night.  When long-term side effects of Seroquel was discussed with patient he is willing to taper it down.  Discussed  adding trazodone as a sleep medication.  He is willing to give it a try.  Patient denies any suicidality, homicidality or perceptual disturbances.  Patient denies any other concerns today.  Visit Diagnosis:    ICD-10-CM   1. PTSD (post-traumatic stress disorder)  F43.10 traZODone (DESYREL) 50 MG tablet    FLUoxetine (PROZAC) 40 MG capsule    QUEtiapine (SEROQUEL) 50 MG tablet  2. Generalized anxiety disorder  F41.1 FLUoxetine (PROZAC) 40 MG capsule  3. 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 Anxiety   . COVID-19    07/04/20  . Depression   . Enteritis   . Gastritis   . GERD (gastroesophageal reflux disease)   . Gilbert's syndrome    hyperbilirubinemia  . IBS (irritable bowel syndrome)   . Inguinal hernia   . Insomnia   . Pre-diabetes   . Prostatitis 2008    Past Surgical History:  Procedure Laterality Date  . BACK SURGERY     2 ruptured discs  . CHOLECYSTECTOMY  01/31/2014   GB polyp, chronic cholecystitis   . ESOPHAGOGASTRODUODENOSCOPY  09/2004   gastritis acute and duodenitis  . ESOPHAGOGASTRODUODENOSCOPY  01-14-14   Dr Allen Norris  . EXPLORATORY LAPAROTOMY  Jan 2015   Dr Burt Knack  . LASIK    .  RIGHT/LEFT HEART CATH AND CORONARY ANGIOGRAPHY N/A 10/18/2019   Procedure: RIGHT/LEFT HEART CATH AND CORONARY ANGIOGRAPHY;  Surgeon: Wellington Hampshire, MD;  Location: [facility];  Service: Cardiovascular;  Laterality: N/A;    Family Psychiatric History: I have reviewed family psychiatric history from my progress note on 09/20/2018  Family History:  Family History  Problem Relation Age of Onset  . Colon polyps Mother   . Cancer Mother        sinus cavity, on XRT  . Asthma Mother   . Heart disease Father   . Heart attack Father        x 2   . Pancreatic cancer Maternal Grandfather   . Colon cancer Maternal Grandfather   . Diabetes Other        strong FH d both sides of family   . Heart Problems Paternal Grandfather   . Asthma Brother   . Cancer Maternal Uncle   . Stroke Cousin        age 20/44  . Mental illness Neg Hx     Social History: I have reviewed social history from my progress note on 09/20/2018 Social History   Socioeconomic History  . Marital status: Divorced    Spouse name: Not on file  . Number of children: 2  . Years of education: Not on file  . Highest education level: High school graduate  Occupational History  . Occupation: Music therapist: [employer]  Tobacco Use  . Smoking status: Never Smoker  . Smokeless tobacco: Never Used  Vaping Use  . Vaping Use: Never used  Substance and Sexual Activity  . Alcohol use: Yes    Comment: occassional social  . Drug use: No  . Sexual activity: Yes  Other Topics Concern  . Not on file  Social History Narrative   Married.   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   Enjoys watching racing, Dealer, deer hunting.             Social Determinants of Health   Financial Resource Strain: Low Risk   . Difficulty of Paying Living Expenses: Not hard at all  Food Insecurity: Not on file  Transportation Needs: Not on file  Physical Activity: Not on file  Stress: Not on file  Social Connections: Not on file    Allergies:  Allergies  Allergen Reactions  . Morphine And Related Anaphylaxis  . Testosterone     Dvt/PE  . Morphine Nausea And Vomiting  . Tdap [Tetanus-Diphth-Acell Pertussis] Hives and Rash    Metabolic Disorder Labs: Lab Results  Component Value Date   HGBA1C 6.0 12/19/2019   No results found for: PROLACTIN Lab Results  Component Value Date   CHOL 183 12/19/2019   TRIG 90.0 12/19/2019   HDL 41.40 12/19/2019   CHOLHDL 4 12/19/2019   VLDL 18.0 12/19/2019   LDLCALC 124 (H) 12/19/2019    LDLCALC 108 (H) 01/24/2019   Lab Results  Component Value Date   TSH 1.400 01/24/2019   TSH 1.88 03/02/2018    Therapeutic Level Labs: No results found for: LITHIUM No results found for: VALPROATE No components found for:  CBMZ  Current Medications: Current Outpatient Medications  Medication Sig Dispense Refill  . traZODone (DESYREL) 50 MG tablet Take 0.5-1 tablets (25-50 mg total) by mouth at bedtime as needed for sleep. 30 tablet 1  . albuterol (VENTOLIN HFA) 108 (90 Base) MCG/ACT inhaler  Inhale 1-2 puffs into the lungs every 6 (six) hours as needed for wheezing or shortness of breath. (Patient not taking: Reported on 07/17/2020) 18 g 11  . cetirizine (ZYRTEC) 10 MG tablet Take 10 mg by mouth daily.    . clonazePAM (KLONOPIN) 0.5 MG tablet TAKE 1 TABLET (0.5 MG TOTAL) BY MOUTH 2 TIMES DAILY AS NEEDED FOR ANXIETY. 60 tablet 1  . EPINEPHrine 0.3 mg/0.3 mL IJ SOAJ injection Inject into the muscle.    Marland Kitchen FLUoxetine (PROZAC) 40 MG capsule Take 1 capsule (40 mg total) by mouth daily. 90 capsule 0  . fluticasone (FLONASE) 50 MCG/ACT nasal spray Place 2 sprays into both nostrils daily. 16 g 2  . meclizine (ANTIVERT) 12.5 MG tablet Take 1-2 tablets (12.5-25 mg total) by mouth 2 (two) times daily as needed for dizziness. 30 tablet 0  . ondansetron (ZOFRAN) 4 MG tablet TAKE 1 TABLET BY MOUTH EVERY 8 HOURS AS NEEDED FOR NAUSEA AND VOMITING 40 tablet 0  . pantoprazole (PROTONIX) 40 MG tablet Take 1 tablet (40 mg total) by mouth daily. 30 min before food 90 tablet 3  . QUEtiapine (SEROQUEL) 50 MG tablet Take half tablet along with Trazodone or skip seroquel few times a week and take seroquel instead 90 tablet 1  . rivaroxaban (XARELTO) 20 MG TABS tablet Take 1 tablet (20 mg total) by mouth daily with supper. 90 tablet 1  . sodium chloride (OCEAN) 0.65 % SOLN nasal spray Place 1 spray into both nostrils as needed for congestion. 30 mL 2  . SUMAtriptan (IMITREX) 50 MG tablet TAKE 1 TABLET AS NEEDED  FOR MIGRAINE-MAY REPEAT IN 2 HR IF HEADACHE PERSISTS(MAX 2/DAY) 9 tablet 2   No current facility-administered medications for this visit.     Musculoskeletal: Strength & Muscle Tone: UTA Gait & Station: UTA Patient leans: N/A  Psychiatric Specialty Exam: Review of Systems  Psychiatric/Behavioral: The patient is nervous/anxious.   All other systems reviewed and are negative.   There were no vitals taken for this visit.There is no height or weight on file to calculate BMI.  General Appearance: Casual  Eye Contact:  Fair  Speech:  Clear and Coherent  Volume:  Normal  Mood:  Anxious coping well  Affect:  Congruent  Thought Process:  Goal Directed and Descriptions of Associations: Intact  Orientation:  Full (Time, Place, and Person)  Thought Content: Logical   Suicidal Thoughts:  No  Homicidal Thoughts:  No  Memory:  Immediate;   Fair Recent;   Fair Remote;   Fair  Judgement:  Fair  Insight:  Fair  Psychomotor Activity:  Normal  Concentration:  Concentration: Fair and Attention Span: Fair  Recall:  [employer] of Knowledge: Fair  Language: Fair  Akathisia:  No  Handed:  Right  AIMS (if indicated): UTA  Assets:  Communication Skills Desire for Improvement Housing Intimacy Social Support Talents/Skills Transportation Vocational/Educational  ADL's:  Intact  Cognition: WNL  Sleep:  Fair   Screenings: GAD-7   Flowsheet Row Video Visit from 08/31/2020 in [facility] Video Visit from 06/17/2020 in [facility] Office Visit from 12/19/2019 in [facility] Office Visit from 01/24/2018 in [facility] Office Visit from 10/24/2016 in [place] [facility]  Total GAD-7 Score 8 18 16 14 14     PHQ2-9   [place] Video Visit from 08/31/2020 in [facility] Counselor from 08/17/2020 in [place] Office Visit from  12/19/2019 in  [facility] Office Visit from 02/21/2019 in [facility] Office Visit from 01/24/2018 in [facility]  PHQ-2 Total Score 1 2 0 0 6  PHQ-9 Total Score -- 10 0 -- 19    Flowsheet Row Video Visit from 08/31/2020 in [facility] Counselor from 08/17/2020 in [facility] from 07/04/2020 in [place] No Risk No Risk No Risk       Assessment and Plan: Sergio Garcia is a 45 year old male, lives in [place], employed, has a history of PTSD, panic attacks, GAD, insomnia was evaluated by telemedicine today.  Patient is currently making progress although he continues to have anxiety symptoms.  He will continue to benefit from psychotherapy sessions to manage his anxiety.  Plan as noted below.  Plan PTSD-improving Prozac 40 mg p.o. daily Taper off Seroquel. We will start trazodone 25-50 mg p.o. nightly as needed Advised patient to skip the Seroquel few times this week and take the trazodone instead for sleep.  If that does not work he could reduce the Seroquel to half tablet and take it in combination with half tablet of trazodone.  Patient advised to call the office back with concerns or questions.  Long-term plan is to taper him off of Seroquel completely.  Patient provided education about long-term use of medications in the class of Seroquel. Continue CBT with Ms. Zadie Rhine  GAD-improving Prozac 40 mg p.o. daily Klonopin 0.5 mg p.o. twice daily as needed for severe anxiety attacks.  He rarely uses it now. Continue to taper off Seroquel.  Panic attacks-stable Prozac as prescribed Continue CBT  Follow-up in clinic in 3 to 4 weeks or sooner if needed.  This note was generated in part or whole with voice recognition software. Voice recognition is usually quite accurate but there are transcription errors that can and  very often do occur. I apologize for any typographical errors that were not detected and corrected.        Ursula Alert, MD 08/31/2020, 1:53 PM

## 2020-09-09 ENCOUNTER — Other Ambulatory Visit: Payer: Self-pay

## 2020-09-09 ENCOUNTER — Ambulatory Visit: Payer: Self-pay | Admitting: Licensed Clinical Social Worker

## 2020-09-24 ENCOUNTER — Telehealth (INDEPENDENT_AMBULATORY_CARE_PROVIDER_SITE_OTHER): Payer: BC Managed Care – PPO | Admitting: Psychiatry

## 2020-09-24 ENCOUNTER — Other Ambulatory Visit: Payer: Self-pay

## 2020-09-24 DIAGNOSIS — Z5329 Procedure and treatment not carried out because of patient's decision for other reasons: Secondary | ICD-10-CM

## 2020-09-24 NOTE — Progress Notes (Signed)
No response to call or text or video invite  

## 2020-09-29 ENCOUNTER — Other Ambulatory Visit: Payer: Self-pay | Admitting: Psychiatry

## 2020-09-29 DIAGNOSIS — F4310 Post-traumatic stress disorder, unspecified: Secondary | ICD-10-CM

## 2020-10-01 ENCOUNTER — Telehealth: Payer: Self-pay | Admitting: Internal Medicine

## 2020-10-01 ENCOUNTER — Ambulatory Visit: Payer: BC Managed Care – PPO | Admitting: Internal Medicine

## 2020-10-01 NOTE — Telephone Encounter (Signed)
Patient no-showed today's appointment; appointment was for 10/01/20 at 9:30 am, provider notified for review of record. Letter sent for patient to call in and re-schedule.

## 2020-10-17 ENCOUNTER — Other Ambulatory Visit: Payer: Self-pay | Admitting: Psychiatry

## 2020-10-17 DIAGNOSIS — F4310 Post-traumatic stress disorder, unspecified: Secondary | ICD-10-CM

## 2020-11-19 ENCOUNTER — Emergency Department: Payer: BC Managed Care – PPO

## 2020-11-19 ENCOUNTER — Telehealth: Payer: Self-pay

## 2020-11-19 ENCOUNTER — Emergency Department
Admission: EM | Admit: 2020-11-19 | Discharge: 2020-11-20 | Disposition: A | Discharge status: Home / Self Care | Payer: BC Managed Care – PPO | Attending: Emergency Medicine | Admitting: Emergency Medicine

## 2020-11-19 DIAGNOSIS — N179 Acute kidney failure, unspecified: Secondary | ICD-10-CM

## 2020-11-19 DIAGNOSIS — Z8616 Personal history of COVID-19: Secondary | ICD-10-CM | POA: Diagnosis not present

## 2020-11-19 DIAGNOSIS — R42 Dizziness and giddiness: Secondary | ICD-10-CM | POA: Diagnosis not present

## 2020-11-19 DIAGNOSIS — M79652 Pain in left thigh: Secondary | ICD-10-CM | POA: Insufficient documentation

## 2020-11-19 DIAGNOSIS — R079 Chest pain, unspecified: Secondary | ICD-10-CM

## 2020-11-19 DIAGNOSIS — M79662 Pain in left lower leg: Secondary | ICD-10-CM | POA: Insufficient documentation

## 2020-11-19 DIAGNOSIS — R61 Generalized hyperhidrosis: Secondary | ICD-10-CM | POA: Diagnosis not present

## 2020-11-19 DIAGNOSIS — R0789 Other chest pain: Secondary | ICD-10-CM | POA: Insufficient documentation

## 2020-11-19 DIAGNOSIS — F4310 Post-traumatic stress disorder, unspecified: Secondary | ICD-10-CM

## 2020-11-19 DIAGNOSIS — R0602 Shortness of breath: Secondary | ICD-10-CM | POA: Diagnosis present

## 2020-11-19 DIAGNOSIS — Z7901 Long term (current) use of anticoagulants: Secondary | ICD-10-CM | POA: Insufficient documentation

## 2020-11-19 LAB — CBC WITH DIFFERENTIAL/PLATELET
Abs Immature Granulocytes: 0.01 10*3/uL (ref 0.00–0.07)
Basophils Absolute: 0.1 10*3/uL (ref 0.0–0.1)
Basophils Relative: 1 %
Eosinophils Absolute: 0.1 10*3/uL (ref 0.0–0.5)
Eosinophils Relative: 2 %
HCT: 47.1 % (ref 39.0–52.0)
Hemoglobin: 16.4 g/dL (ref 13.0–17.0)
Immature Granulocytes: 0 %
Lymphocytes Relative: 34 %
Lymphs Abs: 2.1 10*3/uL (ref 0.7–4.0)
MCH: 31.4 pg (ref 26.0–34.0)
MCHC: 34.8 g/dL (ref 30.0–36.0)
MCV: 90.2 fL (ref 80.0–100.0)
Monocytes Absolute: 0.7 10*3/uL (ref 0.1–1.0)
Monocytes Relative: 12 %
Neutro Abs: 3.1 10*3/uL (ref 1.7–7.7)
Neutrophils Relative %: 51 %
Platelets: 252 10*3/uL (ref 150–400)
RBC: 5.22 MIL/uL (ref 4.22–5.81)
RDW: 12.3 % (ref 11.5–15.5)
WBC: 6.1 10*3/uL (ref 4.0–10.5)
nRBC: 0 % (ref 0.0–0.2)

## 2020-11-19 MED ORDER — TRAZODONE HCL 50 MG PO TABS: 25.0000 mg | ORAL_TABLET | Freq: Every evening | ORAL | 0 refills | Status: DC | PRN

## 2020-11-19 MED ORDER — ONDANSETRON HCL 4 MG/2ML IJ SOLN
4.0000 mg | Freq: Once | INTRAMUSCULAR | Status: AC
  Administered 2020-11-19: 4 mg via INTRAVENOUS
  Filled 2020-11-19: qty 2

## 2020-11-19 MED ORDER — FENTANYL CITRATE (PF) 100 MCG/2ML IJ SOLN
50.0000 ug | Freq: Once | INTRAMUSCULAR | Status: AC
  Administered 2020-11-19: 50 ug via INTRAVENOUS
  Filled 2020-11-19: qty 2

## 2020-11-19 NOTE — Telephone Encounter (Signed)
pt called states that he needs a refill on the trazodone.

## 2020-11-19 NOTE — Telephone Encounter (Signed)
Rx sent 

## 2020-11-19 NOTE — ED Provider Notes (Addendum)
[facility] Emergency Department Provider Note  ____________________________________________   Event Date/Time   First MD Initiated Contact with Patient 11/19/20 2300     (approximate)  I have reviewed the triage vital signs and the nursing notes.   HISTORY  Chief Complaint Shortness of Breath    HPI Sergio Garcia is a 45 y.o. male history of PE, DVT on Xarelto, SVT, hyperlipidemia, Gilbert's, PTSD who presents to the emergency department complaints of left-sided chest pressure, shortness of breath, dizziness with standing, diaphoresis for the past day.  No other aggravating or alleviating factors.  No radiation of pain.  Felt similar to when he was diagnosed with a PE in January 2022.  Reports compliance with his Xarelto.  No fever or cough.  States yesterday he did notice a sharp pain in his left thigh and calf that has resolved.  No leg swelling.  Denies any associated nausea or vomiting.  Reports strong family history of CAD.  He has previously been seen by Dr. Sophronia Simas with cardiology.  Records state that patient normal coronary arteries seen on left heart cath on 10/18/2019.     LHC 10/18/19: 1.  Normal coronary arteries. 2.  Left ventricular angiography was not performed.  EF was normal by echo.  Normal left ventricular end-diastolic pressure 3.  Right heart catheterization showed normal right and left-sided filling pressures, normal pulmonary pressure and normal cardiac output.   Recommendations: Chest pain and shortness of breath seems to be not cardiac in origin based on normal right and left cardiac catheterization.  Echo 11/15/19:  IMPRESSIONS     1. Left ventricular ejection fraction, by estimation, is 50 to 55%. The  left ventricle has low normal function. The left ventricle has no regional  wall motion abnormalities. Left ventricular diastolic parameters were  normal.   2. Right ventricular systolic function is normal. The right ventricular   size is normal.   3. The mitral valve is normal in structure. Trivial mitral valve  regurgitation.   4. The aortic valve is tricuspid. Aortic valve regurgitation is not  visualized. No aortic stenosis is present.   5. Aortic dilatation noted. There is borderline dilatation of the aortic  root measuring 38 mm.   6. The inferior vena cava is normal in size with greater than 50%  respiratory variability, suggesting right atrial pressure of 3 mmHg.      Past Medical History:  Diagnosis Date   Anxiety    COVID-19    07/04/20   Depression    Enteritis    Gastritis    GERD (gastroesophageal reflux disease)    Gilbert's syndrome    hyperbilirubinemia   IBS (irritable bowel syndrome)    Inguinal hernia    Insomnia    Pre-diabetes    Prostatitis 2008    Patient Active Problem List   Diagnosis Date Noted   Acute deep vein thrombosis (DVT) of proximal vein of left lower extremity ([place]) 06/22/2020   Pulmonary embolism ([place]) 06/18/2020   Elevated blood pressure reading 06/18/2020   No-show for appointment 06/09/2020   PSVT (paroxysmal supraventricular tachycardia) ([place]) 12/26/2019   Bradycardia 12/26/2019   Hyperlipidemia 12/19/2019   Panic attacks 11/21/2019   Mucous retention cyst of maxillary sinus 02/21/2019   Weight loss, non-intentional 02/21/2019   Generalized anxiety disorder 02/14/2019   Prediabetes 01/29/2019   PTSD (post-traumatic stress disorder) 01/23/2019   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   Elevated bilirubin 03/07/2018   Palpitations 03/07/2018  PVC's (premature ventricular contractions) 03/07/2018   Back pain 10/06/2016   Routine general medical examination at a health care facility 06/08/2015   Exertional chest pain 10/07/2014   Anxiety 01/23/2014   Elevated LFTs 07/26/2013   Chronic prostatitis 02/26/2013   Orchalgia 02/26/2013   Other procreative management counseling and advice 02/26/2013   Shortness of breath 12/25/2012   GERD  (gastroesophageal reflux disease) 12/20/2012   Chest pain 12/18/2012   ANTERIOR PITUITARY HYPERFUNCTION 11/13/2009   Inguinal hernia, right 10/01/2009   GILBERT'S SYNDROME 09/28/2009   Irritable bowel syndrome with diarrhea 09/28/2009   HERNIATED LUMBAR DISK WITH RADICULOPATHY 11/14/2007   Allergic rhinitis 08/30/2007    Past Surgical History:  Procedure Laterality Date   BACK SURGERY     2 ruptured discs   CHOLECYSTECTOMY  01/31/2014   GB polyp, chronic cholecystitis    ESOPHAGOGASTRODUODENOSCOPY  09/2004   gastritis acute and duodenitis   ESOPHAGOGASTRODUODENOSCOPY  01-14-14   Dr Allen Norris   EXPLORATORY LAPAROTOMY  Jan 2015   Dr Burt Knack   LASIK     RIGHT/LEFT HEART CATH [place] N/A 10/18/2019   Procedure: RIGHT/LEFT HEART CATH AND CORONARY ANGIOGRAPHY;  Surgeon: Wellington Hampshire, MD;  Location: [place] [facility];  Service: Cardiovascular;  Laterality: N/A;    Prior to Admission medications   Medication Sig Start Date End Date Taking? Authorizing Provider  albuterol (VENTOLIN HFA) 108 (90 Base) MCG/ACT inhaler Inhale 1-2 puffs into the lungs every 6 (six) hours as needed for wheezing or shortness of breath. Patient not taking: Reported on 07/17/2020 06/17/20   [name], [name], MD  cetirizine (ZYRTEC) 10 MG tablet Take 10 mg by mouth daily.    [provider]  clonazePAM (KLONOPIN) 0.5 MG tablet TAKE 1 TABLET (0.5 MG TOTAL) BY MOUTH 2 TIMES DAILY AS NEEDED FOR ANXIETY. 04/21/20   Ursula Alert, MD  EPINEPHrine 0.3 mg/0.3 mL IJ SOAJ injection Inject into the muscle. 08/14/20   [provider]  FLUoxetine (PROZAC) 40 MG capsule Take 1 capsule (40 mg total) by mouth daily. 08/31/20   Ursula Alert, MD  fluticasone (FLONASE) 50 MCG/ACT nasal spray Place 2 sprays into both nostrils daily. 06/26/20   [name], [name], MD  meclizine (ANTIVERT) 12.5 MG tablet TAKE 1-2 TABLETS BY MOUTH 2 TIMES DAILY AS NEEDED FOR DIZZINESS. 09/01/20    [name], [name], MD  ondansetron (ZOFRAN) 4 MG tablet TAKE 1 TABLET BY MOUTH EVERY 8 HOURS AS NEEDED FOR NAUSEA AND VOMITING 07/28/20   [name], [name], MD  pantoprazole (PROTONIX) 40 MG tablet Take 1 tablet (40 mg total) by mouth daily. 30 min before food 12/19/19   [name], [name], MD  QUEtiapine (SEROQUEL) 50 MG tablet Take half tablet along with Trazodone or skip seroquel few times a week and take seroquel instead 08/31/20   Ursula Alert, MD  rivaroxaban (XARELTO) 20 MG TABS tablet Take 1 tablet (20 mg total) by mouth daily with supper. 07/17/20   [name], [name], MD  sodium chloride (OCEAN) 0.65 % SOLN nasal spray Place 1 spray into both nostrils as needed for congestion. 06/26/20   [name], [name], MD  SUMAtriptan (IMITREX) 50 MG tablet TAKE 1 TABLET AS NEEDED FOR MIGRAINE-MAY REPEAT IN 2 HR IF HEADACHE PERSISTS(MAX 2/DAY) 07/14/20   [name], [name], MD  traZODone (DESYREL) 50 MG tablet Take 0.5-1 tablets (25-50 mg total) by mouth at bedtime as needed. for sleep 11/19/20   Orlene Erm, MD    Allergies Morphine  and related, Testosterone, Morphine, and Tdap [tetanus-diphth-acell pertussis]  Family History  Problem Relation Age of Onset   Colon polyps Mother    Cancer Mother        sinus cavity, on XRT   Asthma Mother    Heart disease Father    Heart attack Father        x 2   Pancreatic cancer Maternal Grandfather    Colon cancer Maternal Grandfather    Diabetes Other        strong FH d both sides of family    Heart Problems Paternal Grandfather    Asthma Brother    Cancer Maternal Uncle    Stroke Cousin        age 15/44   Mental illness Neg Hx     Social History Social History   Tobacco Use   Smoking status: Never   Smokeless tobacco: Never  Vaping Use   Vaping Use: Never used  Substance Use Topics   Alcohol use: Yes    Comment: occassional social   Drug use: No    Review of Systems Constitutional: No  fever. Eyes: No visual changes. ENT: No sore throat. Cardiovascular: + chest pain. Respiratory: + shortness of breath. Gastrointestinal: No nausea, vomiting, diarrhea. Genitourinary: Negative for dysuria. Musculoskeletal: Negative for back pain. Skin: Negative for rash. Neurological: Negative for focal weakness or numbness.  ____________________________________________   PHYSICAL EXAM:  VITAL SIGNS: ED Triage Vitals  Enc Vitals Group     BP 11/19/20 2305 117/90     Pulse Rate 11/19/20 2305 83     Resp 11/19/20 2305 18     Temp 11/19/20 2305 97.9 F (36.6 C)     Temp Source 11/19/20 2305 Oral     SpO2 11/19/20 2305 95 %     Weight 11/19/20 2306 210 lb 1.6 oz (95.3 kg)     Height 11/19/20 2306 6\' 2"  (1.88 m)     Head Circumference --      Peak Flow --      Pain Score --      Pain Loc --      Pain Edu? --      Excl. in [place]? --    CONSTITUTIONAL: Alert and oriented and responds appropriately to questions. Well-appearing; well-nourished HEAD: Normocephalic EYES: Conjunctivae clear, pupils appear equal, EOM appear intact ENT: normal nose; moist mucous membranes NECK: Supple, normal ROM CARD: RRR; S1 and S2 appreciated; no murmurs, no clicks, no rubs, no gallops RESP: Normal chest excursion without splinting or tachypnea; breath sounds clear and equal bilaterally; no wheezes, no rhonchi, no rales, no hypoxia or respiratory distress, speaking full sentences ABD/GI: Normal bowel sounds; non-distended; soft, non-tender, no rebound, no guarding, no peritoneal signs, no hepatosplenomegaly BACK: The back appears normal EXT: Normal ROM in all joints; no deformity noted, no edema; no cyanosis, no calf tenderness or calf swelling SKIN: Normal color for age and race; warm; no rash on exposed skin NEURO: Moves all extremities equally PSYCH: The patient's mood and manner are appropriate.  ____________________________________________   LABS (all labs ordered are listed, but only  abnormal results are displayed)  Labs Reviewed  BASIC METABOLIC PANEL - Abnormal; Notable for the following components:      Result Value   Glucose, Bld 103 (*)    Creatinine, Ser 1.46 (*)    All other components within normal limits  CBC WITH DIFFERENTIAL/PLATELET  TROPONIN I (HIGH SENSITIVITY)  TROPONIN I (HIGH SENSITIVITY)   ____________________________________________  EKG  EKG Interpretation  Date/Time:  Thursday November 19 2020 23:10:00 EDT Ventricular Rate:  94 PR Interval:  144 QRS Duration: 79 QT Interval:  327 QTC Calculation: 409 R Axis:   61 Text Interpretation: Sinus rhythm Borderline T abnormalities, diffuse leads No significant change since last tracing Confirmed by Pryor Curia [phone]) on 11/19/2020 11:23:02 PM         ____________________________________________  RADIOLOGY Jessie Foot Oakley Orban, personally viewed and evaluated these images (plain radiographs) as part of my medical decision making, as well as reviewing the written report by the radiologist.  ED MD interpretation: CT chest shows no PE or other acute pathology.  Venous Dopplers of bilateral lower extremities negative.  Official radiology report(s): CT Angio Chest PE W and/or Wo Contrast  Result Date: 11/20/2020 CLINICAL DATA:  Dyspnea, midsternal chest pain EXAM: CT ANGIOGRAPHY CHEST WITH CONTRAST TECHNIQUE: Multidetector CT imaging of the chest was performed using the standard protocol during bolus administration of intravenous contrast. Multiplanar CT image reconstructions and MIPs were obtained to evaluate the vascular anatomy. CONTRAST:  168mL OMNIPAQUE IOHEXOL 350 MG/ML SOLN COMPARISON:  07/04/2020 FINDINGS: Cardiovascular: Satisfactory opacification of the pulmonary arteries to the segmental level. No evidence of pulmonary embolism. Normal heart size. No pericardial effusion. Mediastinum/Nodes: No enlarged mediastinal, hilar, or axillary lymph nodes. Thyroid gland, trachea, and esophagus  demonstrate no significant findings. Lungs/Pleura: Lungs are clear. No pleural effusion or pneumothorax. Upper Abdomen: Status post cholecystectomy.  No acute abnormality. Musculoskeletal: No chest wall abnormality. No acute or significant osseous findings. Review of the MIP images confirms the above findings. IMPRESSION: Normal examination. No acute intrathoracic pathology identified. No definite radiographic explanation for the patient's reported symptoms. Electronically Signed   By: Fidela Salisbury MD   On: 11/20/2020 00:47   US Venous Img Lower Bilateral  Result Date: 11/20/2020 CLINICAL DATA:  Leg pain EXAM: BILATERAL LOWER EXTREMITY VENOUS DOPPLER ULTRASOUND TECHNIQUE: Gray-scale sonography with compression, as well as color and duplex ultrasound, were performed to evaluate the deep venous system(s) from the level of the common femoral vein through the popliteal and proximal calf veins. COMPARISON:  None. FINDINGS: VENOUS Normal compressibility of the common femoral, superficial femoral, and popliteal veins, as well as the visualized calf veins. Visualized portions of profunda femoral vein and great saphenous vein unremarkable. No filling defects to suggest DVT on grayscale or color Doppler imaging. Doppler waveforms show normal direction of venous flow, normal respiratory plasticity and response to augmentation. OTHER Rouleaux formation noted within the left common femoral vein which can signal slow flow. Limitations: none IMPRESSION: No evidence of lower extremity DVT. Electronically Signed   By: Rolm Baptise M.D.   On: 11/20/2020 00:56    ____________________________________________   PROCEDURES  Procedure(s) performed (including Critical Care):  Procedures  ____________________________________________   INITIAL IMPRESSION / ASSESSMENT AND PLAN / ED COURSE  As part of my medical decision making, I reviewed the following data within the [place] notes reviewed and  incorporated, Labs reviewed , EKG interpreted , Old EKG reviewed, Old chart reviewed, Radiograph reviewed , and Notes from prior ED visits         Patient here with complaints of chest pain.  Has history of PE but reports compliance with Xarelto.  Also complains of some left leg pain that has resolved.  Will obtain CTA of the chest as well as bilateral lower extremity venous Dopplers to ensure no new clot present today.  Low suspicion for ACS given recent cardiac catheterization a  year ago that showed normal coronary arteries.  EKG shows no new ischemic change, arrhythmia.  He denies any infectious symptoms.  Labs pending.  Will give fentanyl, Zofran as he reports this has helped him in the past when he has had similar chest pain.  Doubt dissection.  ED PROGRESS  Patient CT of his chest shows no acute abnormality.  Specifically no PE.  Bilateral lower extremity Dopplers also negative.  Plan is to check second troponin and if this is negative, discharge home.  He reports feeling better after fentanyl, Zofran.  Continues to be hemodynamically stable.  No increased work of breathing, respiratory distress, hypoxia.    Labs show that his creatinine is minimally elevated from his baseline.  We will give a liter of IV fluids here.   Patient's second troponin is negative.  He is still well-appearing with normal vital signs.  Reports feeling better.  Recommended close follow-up with his primary care doctor.  Will discharge.   At this time, I do not feel there is any life-threatening condition present. I have reviewed, interpreted and discussed all results (EKG, imaging, lab, urine as appropriate) and exam findings with patient/family. I have reviewed nursing notes and appropriate previous records.  I feel the patient is safe to be discharged home without further emergent workup and can continue workup as an outpatient as needed. Discussed usual and customary return precautions. Patient/family verbalize  understanding and are comfortable with this plan.  Outpatient follow-up has been provided as needed. All questions have been answered.  ___________________________________________   FINAL CLINICAL IMPRESSION(S) / ED DIAGNOSES  Final diagnoses:  Shortness of breath  Left-sided chest pain  AKI (acute kidney injury) [facility])     ED Discharge Orders     None       *Please note:  Sergio Garcia was evaluated in Emergency Department on 11/20/2020 for the symptoms described in the history of present illness. He was evaluated in the context of the global COVID-19 pandemic, which necessitated consideration that the patient might be at risk for infection with the SARS-CoV-2 virus that causes COVID-19. Institutional protocols and algorithms that pertain to the evaluation of patients at risk for COVID-19 are in a state of rapid change based on information released by regulatory bodies including the CDC and federal and state organizations. These policies and algorithms were followed during the patient's care in the ED.  Some ED evaluations and interventions may be delayed as a result of limited staffing during and the pandemic.*   Note:  This document was prepared using Dragon voice recognition software and may include unintentional dictation errors.    Bryttani Blew, Delice Bison, DO 11/20/20 [place], Delice Bison, DO 11/20/20 0932

## 2020-11-19 NOTE — ED Triage Notes (Signed)
Patient arrives via EMS with complaint of shortness of breath since yesterday at 8pm. Patient also complains of mid-center chest pain, dizziness. Patient with history of PE in January, currently on Xarelto. Patient states these complaints feel the same as the last time he was seen for a PE. Patient's BG is 199, arrives AxOx4.

## 2020-11-20 ENCOUNTER — Emergency Department: Payer: BC Managed Care – PPO

## 2020-11-20 ENCOUNTER — Encounter: Payer: Self-pay | Admitting: Radiology

## 2020-11-20 LAB — TROPONIN I (HIGH SENSITIVITY)
Troponin I (High Sensitivity): 5 ng/L (ref <18)
Troponin I (High Sensitivity): 6 ng/L (ref <18)

## 2020-11-20 LAB — BASIC METABOLIC PANEL
Anion gap: 9 (ref 5–15)
BUN: 19 mg/dL (ref 6–20)
CO2: 24 mmol/L (ref 22–32)
Calcium: 9.4 mg/dL (ref 8.9–10.3)
Chloride: 107 mmol/L (ref 98–111)
Creatinine, Ser: 1.46 mg/dL — ABNORMAL HIGH (ref 0.61–1.24)
GFR, Estimated: >60 mL/min (ref >60)
Glucose, Bld: 103 mg/dL — ABNORMAL HIGH (ref 70–99)
Potassium: 4 mmol/L (ref 3.5–5.1)
Sodium: 140 mmol/L (ref 135–145)

## 2020-11-20 MED ORDER — IOHEXOL 350 MG/ML SOLN
100.0000 mL | Freq: Once | INTRAVENOUS | Status: AC | PRN
  Administered 2020-11-20: 100 mL via INTRAVENOUS

## 2020-11-20 MED ORDER — SODIUM CHLORIDE 0.9 % IV BOLUS (SEPSIS)
1000.0000 mL | Freq: Once | INTRAVENOUS | Status: AC
  Administered 2020-11-20: 1000 mL via INTRAVENOUS

## 2020-11-20 NOTE — Discharge Instructions (Signed)
Your cardiac work-up today was reassuring.  Your CT scan showed no pulmonary embolus and the ultrasound of both of your legs showed no deep vein thrombosis.  Please continue your anticoagulation as prescribed.  Your kidney function was slightly elevated with a creatinine of 1.4 today.  We have given you IV fluids and recommend that you have this rechecked by your doctor in the next 1 to 2 weeks.  I recommend avoiding NSAIDs such as aspirin, ibuprofen, Aleve, Goody powders at this time.

## 2020-11-24 ENCOUNTER — Telehealth: Payer: Self-pay

## 2020-11-24 DIAGNOSIS — K219 Gastro-esophageal reflux disease without esophagitis: Secondary | ICD-10-CM

## 2020-11-24 MED ORDER — PANTOPRAZOLE SODIUM 40 MG PO TBEC: 40.0000 mg | DELAYED_RELEASE_TABLET | Freq: Every day | ORAL | 3 refills | Status: DC

## 2020-11-24 NOTE — Telephone Encounter (Signed)
Pt needs a refill of pantoprazole (PROTONIX) 40 MG tablet sent to [facility] on [address]-pt has appt on 12/04/20

## 2020-12-04 ENCOUNTER — Other Ambulatory Visit: Payer: Self-pay

## 2020-12-04 ENCOUNTER — Telehealth: Payer: Self-pay

## 2020-12-04 ENCOUNTER — Encounter: Payer: Self-pay | Admitting: Internal Medicine

## 2020-12-04 ENCOUNTER — Ambulatory Visit: Payer: BC Managed Care – PPO | Admitting: Internal Medicine

## 2020-12-04 VITALS — BP 122/82 | HR 72 | Temp 97.8°F | Ht 74.0 in | Wt 204.0 lb

## 2020-12-04 DIAGNOSIS — R7989 Other specified abnormal findings of blood chemistry: Secondary | ICD-10-CM | POA: Diagnosis not present

## 2020-12-04 DIAGNOSIS — R7303 Prediabetes: Secondary | ICD-10-CM | POA: Diagnosis not present

## 2020-12-04 DIAGNOSIS — I319 Disease of pericardium, unspecified: Secondary | ICD-10-CM | POA: Diagnosis not present

## 2020-12-04 DIAGNOSIS — R748 Abnormal levels of other serum enzymes: Secondary | ICD-10-CM

## 2020-12-04 DIAGNOSIS — R0789 Other chest pain: Secondary | ICD-10-CM | POA: Diagnosis not present

## 2020-12-04 DIAGNOSIS — M791 Myalgia, unspecified site: Secondary | ICD-10-CM

## 2020-12-04 DIAGNOSIS — G43001 Migraine without aura, not intractable, with status migrainosus: Secondary | ICD-10-CM

## 2020-12-04 DIAGNOSIS — R0602 Shortness of breath: Secondary | ICD-10-CM

## 2020-12-04 DIAGNOSIS — Z13818 Encounter for screening for other digestive system disorders: Secondary | ICD-10-CM

## 2020-12-04 DIAGNOSIS — Z0184 Encounter for antibody response examination: Secondary | ICD-10-CM | POA: Diagnosis not present

## 2020-12-04 DIAGNOSIS — K580 Irritable bowel syndrome with diarrhea: Secondary | ICD-10-CM

## 2020-12-04 DIAGNOSIS — K589 Irritable bowel syndrome without diarrhea: Secondary | ICD-10-CM

## 2020-12-04 DIAGNOSIS — Z1329 Encounter for screening for other suspected endocrine disorder: Secondary | ICD-10-CM | POA: Diagnosis not present

## 2020-12-04 DIAGNOSIS — R5383 Other fatigue: Secondary | ICD-10-CM

## 2020-12-04 DIAGNOSIS — K219 Gastro-esophageal reflux disease without esophagitis: Secondary | ICD-10-CM

## 2020-12-04 DIAGNOSIS — J0111 Acute recurrent frontal sinusitis: Secondary | ICD-10-CM

## 2020-12-04 DIAGNOSIS — I2699 Other pulmonary embolism without acute cor pulmonale: Secondary | ICD-10-CM

## 2020-12-04 DIAGNOSIS — E559 Vitamin D deficiency, unspecified: Secondary | ICD-10-CM | POA: Diagnosis not present

## 2020-12-04 LAB — TSH: TSH: 1.58 u[IU]/mL (ref 0.35–5.50)

## 2020-12-04 LAB — COMPREHENSIVE METABOLIC PANEL
ALT: 18 U/L (ref 0–53)
AST: 18 U/L (ref 0–37)
Albumin: 4.6 g/dL (ref 3.5–5.2)
Alkaline Phosphatase: 56 U/L (ref 39–117)
BUN: 18 mg/dL (ref 6–23)
CO2: 30 mEq/L (ref 19–32)
Calcium: 9.7 mg/dL (ref 8.4–10.5)
Chloride: 103 mEq/L (ref 96–112)
Creatinine, Ser: 1.1 mg/dL (ref 0.40–1.50)
GFR: 82.54 mL/min (ref >60.00)
Glucose, Bld: 90 mg/dL (ref 70–99)
Potassium: 4.3 mEq/L (ref 3.5–5.1)
Sodium: 140 mEq/L (ref 135–145)
Total Bilirubin: 1.6 mg/dL — ABNORMAL HIGH (ref 0.2–1.2)
Total Protein: 7.1 g/dL (ref 6.0–8.3)

## 2020-12-04 LAB — C-REACTIVE PROTEIN: CRP: <1 mg/dL (ref 0.5–20.0)

## 2020-12-04 LAB — SEDIMENTATION RATE: Sed Rate: 3 mm/hr (ref 0–15)

## 2020-12-04 LAB — VITAMIN D 25 HYDROXY (VIT D DEFICIENCY, FRACTURES): VITD: 33.09 ng/mL (ref 30.00–100.00)

## 2020-12-04 LAB — CK: Total CK: 110 U/L (ref 7–232)

## 2020-12-04 LAB — HEMOGLOBIN A1C: Hgb A1c MFr Bld: 6 % (ref 4.6–6.5)

## 2020-12-04 MED ORDER — FLUTICASONE PROPIONATE 50 MCG/ACT NA SUSP: 2.0000 | Freq: Every day | NASAL | 11 refills | Status: DC

## 2020-12-04 MED ORDER — RIVAROXABAN 20 MG PO TABS: 20.0000 mg | ORAL_TABLET | Freq: Every day | ORAL | 3 refills | Status: DC

## 2020-12-04 MED ORDER — SALINE SPRAY 0.65 % NA SOLN: 1.0000 | NASAL | 11 refills | Status: DC | PRN

## 2020-12-04 MED ORDER — SUMATRIPTAN SUCCINATE 50 MG PO TABS: ORAL_TABLET | ORAL | 11 refills | Status: DC

## 2020-12-04 NOTE — Telephone Encounter (Signed)
Lvm to return in six months for PCP

## 2020-12-04 NOTE — Progress Notes (Signed)
Chief Complaint  Patient presents with   Follow-up   ED f/u [facility] 6/30-11/20/20 sob and left sharp chest pain and orthopnea trop neg x 2 and ekg today normal he feels like someone put a pillow on his face, no h/o asthma, +GERD uncontrolled. No heavy lifting pain 4/10 today was 7/10 in the ED and sharp worse with leaning back. Had pfts 12/25/12 nl spirometry. 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  HENT:  Negative for hearing loss.   Eyes:  Negative for blurred vision.  Respiratory:  Positive for shortness of breath.   Cardiovascular:  Positive for chest pain.  Gastrointestinal:  Positive for diarrhea and heartburn.  Skin:  Negative for rash.  Psychiatric/Behavioral:  The patient is not nervous/anxious.   Past Medical History:  Diagnosis Date   Anxiety    COVID-19    07/04/20   Depression    Enteritis    Gastritis    GERD (gastroesophageal reflux disease)    Gilbert's syndrome    hyperbilirubinemia   IBS (irritable bowel syndrome)    Inguinal hernia    Insomnia    Pre-diabetes    Prostatitis 2008   Past Surgical History:  Procedure Laterality Date   BACK SURGERY     2 ruptured discs   CHOLECYSTECTOMY  01/31/2014   GB polyp, chronic cholecystitis    ESOPHAGOGASTRODUODENOSCOPY  09/2004   gastritis acute and duodenitis   ESOPHAGOGASTRODUODENOSCOPY  01-14-14   Dr Allen Norris   EXPLORATORY LAPAROTOMY  Jan 2015   Dr Burt Knack   LASIK     RIGHT/LEFT HEART CATH [place] N/A 10/18/2019   Procedure: RIGHT/LEFT HEART CATH AND CORONARY ANGIOGRAPHY;  Surgeon: Wellington Hampshire, MD;  Location: [facility];  Service:  Cardiovascular;  Laterality: N/A;   Family History  Problem Relation Age of Onset   Colon polyps Mother    Cancer Mother        sinus cavity, on XRT   Asthma Mother    Heart disease Father    Heart attack Father        x 2   Pancreatic cancer Maternal Grandfather    Colon cancer Maternal Grandfather    Diabetes Other        strong FH d both sides of family    Heart Problems Paternal Grandfather    Asthma Brother    Cancer Maternal Uncle    Stroke Cousin        age 74/44   Mental illness Neg Hx    Social History   Socioeconomic History   Marital status: Divorced    Spouse name: Not on file   Number of children: 2   Years of education: Not on file   Highest education level: High school graduate  Occupational History   Occupation: Music therapist: [employer]  Tobacco Use   Smoking status: Never   Smokeless tobacco: Never  Vaping Use   Vaping Use: Never used  Substance and Sexual Activity   Alcohol use: Yes  Comment: occassional social   Drug use: No   Sexual activity: Yes  Other Topics Concern   Not on file  Social History Narrative   Married.   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   Enjoys watching racing, Dealer, deer hunting.             Social Determinants of Health   Financial Resource Strain: Low Risk    Difficulty of Paying Living Expenses: Not hard at all  Food Insecurity: Not on file  Transportation Needs: Not on file  Physical Activity: Not on file  Stress: Not on file  Social Connections: Not on file  Intimate Partner Violence: Not on file   Current Meds  Medication Sig   albuterol (VENTOLIN HFA) 108 (90 Base) MCG/ACT inhaler Inhale 1-2 puffs into the lungs every 6 (six) hours as needed for wheezing or shortness of breath.   cetirizine (ZYRTEC) 10 MG tablet Take 10 mg by mouth daily.   clonazePAM (KLONOPIN) 0.5 MG tablet TAKE 1 TABLET (0.5 MG TOTAL) BY MOUTH 2 TIMES DAILY AS NEEDED FOR  ANXIETY.   EPINEPHrine 0.3 mg/0.3 mL IJ SOAJ injection Inject into the muscle.   FLUoxetine (PROZAC) 40 MG capsule Take 1 capsule (40 mg total) by mouth daily.   ondansetron (ZOFRAN) 4 MG tablet TAKE 1 TABLET BY MOUTH EVERY 8 HOURS AS NEEDED FOR NAUSEA AND VOMITING   pantoprazole (PROTONIX) 40 MG tablet Take 1 tablet (40 mg total) by mouth daily. 30 min before food   QUEtiapine (SEROQUEL) 50 MG tablet Take half tablet along with Trazodone or skip seroquel few times a week and take seroquel instead   traZODone (DESYREL) 50 MG tablet Take 0.5-1 tablets (25-50 mg total) by mouth at bedtime as needed. for sleep   [DISCONTINUED] fluticasone (FLONASE) 50 MCG/ACT nasal spray Place 2 sprays into both nostrils daily.   [DISCONTINUED] rivaroxaban (XARELTO) 20 MG TABS tablet Take 1 tablet (20 mg total) by mouth daily with supper.   [DISCONTINUED] sodium chloride (OCEAN) 0.65 % SOLN nasal spray Place 1 spray into both nostrils as needed for congestion.   [DISCONTINUED] SUMAtriptan (IMITREX) 50 MG tablet TAKE 1 TABLET AS NEEDED FOR MIGRAINE-MAY REPEAT IN 2 HR IF HEADACHE PERSISTS(MAX 2/DAY)   Allergies  Allergen Reactions   Morphine And Related Anaphylaxis   Testosterone     Dvt/PE   Morphine Nausea And Vomiting   Tdap [Tetanus-Diphth-Acell Pertussis] Hives and Rash   Recent Results (from the past 2160 hour(s))  CBC with Differential/Platelet     Status: None   Collection Time: 11/19/20 11:31 PM  Result Value Ref Range   WBC 6.1 4.0 - 10.5 K/uL   RBC 5.22 4.22 - 5.81 MIL/uL   Hemoglobin 16.4 13.0 - 17.0 g/dL   HCT 47.1 39.0 - 52.0 %   MCV 90.2 80.0 - 100.0 fL   MCH 31.4 26.0 - 34.0 pg   MCHC 34.8 30.0 - 36.0 g/dL   RDW 12.3 11.5 - 15.5 %   Platelets 252 150 - 400 K/uL   nRBC 0.0 0.0 - 0.2 %   Neutrophils Relative % 51 %   Neutro Abs 3.1 1.7 - 7.7 K/uL   Lymphocytes Relative 34 %   Lymphs Abs 2.1 0.7 - 4.0 K/uL   Monocytes Relative 12 %   Monocytes Absolute 0.7 0.1 - 1.0 K/uL   Eosinophils  Relative 2 %   Eosinophils Absolute 0.1 0.0 - 0.5 K/uL  Basophils Relative 1 %   Basophils Absolute 0.1 0.0 - 0.1 K/uL   Immature Granulocytes 0 %   Abs Immature Granulocytes 0.01 0.00 - 0.07 K/uL    Comment: Performed at [facility], [address] [place], [place] [address]  Basic metabolic panel     Status: Abnormal   Collection Time: 11/19/20 11:31 PM  Result Value Ref Range   Sodium 140 135 - 145 mmol/L   Potassium 4.0 3.5 - 5.1 mmol/L   Chloride 107 98 - 111 mmol/L   CO2 24 22 - 32 mmol/L   Glucose, Bld 103 (H) 70 - 99 mg/dL    Comment: Glucose reference range applies only to samples taken after fasting for at least 8 hours.   BUN 19 6 - 20 mg/dL   Creatinine, Ser 1.46 (H) 0.61 - 1.24 mg/dL   Calcium 9.4 8.9 - 10.3 mg/dL   GFR, Estimated >60 >60 mL/min    Comment: (NOTE) Calculated using the CKD-EPI Creatinine Equation (2021)    Anion gap 9 5 - 15    Comment: Performed at [facility], [place], [place] [address]  Troponin I (High Sensitivity)     Status: None   Collection Time: 11/19/20 11:31 PM  Result Value Ref Range   Troponin I (High Sensitivity) 6 <18 ng/L    Comment: (NOTE) Elevated high sensitivity troponin I (hsTnI) values and significant  changes across serial measurements may suggest ACS but many other  chronic and acute conditions are known to elevate hsTnI results.  Refer to the "Links" section for chest pain algorithms and additional  guidance. Performed at [facility], [place], [place] [address]   Troponin I (High Sensitivity)     Status: None   Collection Time: 11/20/20  1:09 AM  Result Value Ref Range   Troponin I (High Sensitivity) 5 <18 ng/L    Comment: (NOTE) Elevated high sensitivity troponin I (hsTnI) values and significant  changes across serial measurements may suggest ACS but many other  chronic and acute conditions are known to elevate hsTnI results.  Refer to the "Links"  section for chest pain algorithms and additional  guidance. Performed at [facility], [address] [place], [place] [address]    Objective  Body mass index is 26.19 kg/m. Wt Readings from Last 3 Encounters:  12/04/20 204 lb (92.5 kg)  11/19/20 210 lb 1.6 oz (95.3 kg)  07/04/20 210 lb (95.3 kg)   Temp Readings from Last 3 Encounters:  12/04/20 97.8 F (36.6 C) (Oral)  11/19/20 97.9 F (36.6 C) (Oral)  07/04/20 98.4 F (36.9 C) (Oral)   BP Readings from Last 3 Encounters:  12/04/20 122/82  11/20/20 105/76  07/04/20 (!) 122/93   Pulse Readings from Last 3 Encounters:  12/04/20 72  11/20/20 68  07/04/20 77    Physical Exam Vitals and nursing note reviewed.  Constitutional:      Appearance: Normal appearance. He is well-developed and well-groomed.  HENT:     Head: Normocephalic and atraumatic.  Eyes:     Conjunctiva/sclera: Conjunctivae normal.     Pupils: Pupils are equal, round, and reactive to light.  Cardiovascular:     Rate and Rhythm: Normal rate and regular rhythm.     Heart sounds: Normal heart sounds. No murmur heard. Pulmonary:     Effort: Pulmonary effort is normal.     Breath sounds: Normal breath sounds.  Skin:    General: Skin is  warm and dry.  Neurological:     General: No focal deficit present.     Mental Status: He is alert and oriented to person, place, and time.     Gait: Gait normal.  Psychiatric:        Attention and Perception: Attention and perception normal.        Mood and Affect: Mood and affect normal.        Speech: Speech normal.        Behavior: Behavior normal. Behavior is cooperative.        Thought Content: Thought content normal.        Cognition and Memory: Cognition and memory normal.        Judgment: Judgment normal.    Assessment  Plan  Atypical chest pain does not feel panic could be gerd 10/2019 abnormal stress test needs cards w/u and r/o- Plan: Sedimentation rate, C-reactive protein, Ambulatory  referral to Gastroenterology, Ambulatory referral to Cardiology  Elevated serum creatinine - Plan: Comprehensive metabolic panel, Microalbumin / creatinine urine ratio, Sodium, urine, random Elevated liver enzymes - Plan: Comprehensive metabolic panel, Microalbumin / creatinine urine ratio, Sodium, urine, random  Prediabetes - Plan: Hemoglobin A1c  PE (pulmonary thromboembolism) ([place]) - Plan: rivaroxaban (XARELTO) 20 MG TABS tablet since 06/2020 will be on x 1 year provoked by testosterone no longer taking black market CT 11/20/20 negative   Migraine without aura and with status migrainosus, not intractable - Plan: SUMAtriptan (IMITREX) 50 MG tablet Chronic sinusitis - Plan: fluticasone (FLONASE) 50 MCG/ACT nasal spray, sodium chloride (OCEAN) 0.65 % SOLN nasal spray  Fatigue, unspecified type  Irritable bowel syndrome D/GERD  Referred to GI   SOB (shortness of breath) - Plan: Ambulatory referral to Cardiology, Ambulatory referral to Pulmonology  Gastroesophageal reflux disease without esophagitis - Plan: Ambulatory referral to Gastroenterology  HM Flu shot utd  utd Tdap had 06/20/11 and 01/04/2018 [facility]  rec MMR vaccine  covid 3/3 pfizer  Completed hep B 3/3 vxs 12/2017 [facility]  -hep B immune Prediabetes 6.0 A1C  01/24/19 rec healthy diet and exercise  Dermatology saw [place] [facility] 2019 left scalp bx negative   07/25/2018 stomach bx with EGD Dr. Gustavo Lah mild chronic gastritis and mild foveolar hyperplasia negative H pylori no repeat , neg metaplasia negative barretts or adenoca -no repeat EGD needed    07/25/2018 Colonoscopy per report due in 5 years  -mild chronic gastritis and foveolar hyperplasia    EGD 01/14/14 +gastritis negative bxs for H pylori or any other etiology   PSA 01/24/19 1.5 normal    Of note: normal spirometry 12/25/12 Negative small bowel series 05/29/13   CT renal 10/07/16 -no c/o back pain hold imaging for now as of 01/23/19 had Xray 08/2018  ED, MRI L spine 11/09/16 and 10/27/07   IMPRESSION: 1. No acute findings. No renal or ureteral stones. No obstructive uropathy. 2. Status post cholecystectomy. 3. Evidence of a broad-based disc protrusion at L4-L5 narrowing the right lateral recess. Consider further assessment with lumbar MRI with and without contrast if the patient has symptoms consistent with nerve impingement.     Xray eye 11/09/16  IMPRESSION: 1. No evidence of metallic foreign body within the orbits. 2. 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   -of note pt wants removed    US thyroid 12/14/09 normal   Stress test negative unc 01/05/18 and CXR 12/25/17 negative   Eye Dr. Marvel Plan     Provider: Dr. Olivia Mackie  [name]

## 2020-12-04 NOTE — Patient Instructions (Addendum)
Sergio Garcia as needed  Sergio Garcia ask if they think this would work or another prescription  Stop diary   Low-FODMAP Eating Plan  FODMAP stands for fermentable oligosaccharides, disaccharides, monosaccharides, and polyols. These are sugars that are hard for some people to digest. A low-FODMAP eating plan may help some people who have irritable bowel syndrome (IBS) and certain other bowel (intestinal) diseases to manage their symptoms. This meal plan can be complicated to follow. Work with a diet and nutrition specialist (dietitian) to make a low-FODMAP eating plan that is right for you. A dietitian can helpmake sure that you get enough nutrition from this diet. What are tips for following this plan? Reading food labels Check labels for hidden FODMAPs such as: High-fructose syrup. Honey. Agave. Natural fruit flavors. Onion or garlic powder. Choose low-FODMAP foods that contain 3-4 grams of fiber per serving. Check food labels for serving sizes. Eat only one serving at a time to make sure FODMAP levels stay low. Sho[employer] with a list of foods that are recommended on this diet and make a meal plan. Meal planning Follow a low-FODMAP eating plan for up to 6 weeks, or as told by your health care provider or dietitian. To follow the eating plan: Eliminate high-FODMAP foods from your diet completely. Choose only low-FODMAP foods to eat. You will do this for 2-6 weeks. Gradually reintroduce high-FODMAP foods into your diet one at a time. Most people should wait a few days before introducing the next new high-FODMAP food into their meal plan. Your dietitian can recommend how quickly you may reintroduce foods. Keep a daily record of what and how much you eat and drink. Make note of any symptoms that you have after eating. Review your daily record with a dietitian regularly to identify which foods you can eat and which foods you should avoid. General tips Drink enough fluid each day to keep your urine  pale yellow. Avoid processed foods. These often have added sugar and may be high in FODMAPs. Avoid most dairy products, whole grains, and sweeteners. Work with a dietitian to make sure you get enough fiber in your diet. Avoid high FODMAP foods at meals to manage symptoms. Recommended foods Fruits Bananas, oranges, tangerines, lemons, limes, blueberries, raspberries, strawberries, grapes, cantaloupe, honeydew melon, kiwi, papaya, passion fruit, and pineapple. Limited amounts of dried cranberries, banana chips, and shreddedcoconut. Vegetables Eggplant, zucchini, cucumber, peppers, green beans, bean sprouts, lettuce, arugula, kale, Swiss chard, spinach, collard greens, bok choy, summer squash, potato, and tomato. Limited amounts of corn, carrot, and sweet potato. Greenparts of scallions. Grains Gluten-free grains, such as rice, oats, buckwheat, quinoa, corn, polenta, andmillet. Gluten-free pasta, bread, or cereal. Rice noodles. Corn tortillas. Meats and other proteins Unseasoned beef, pork, poultry, or fish. Eggs. Sergio Garcia. Tofu (firm) and tempeh. Limited amounts of nuts and seeds, such as almonds, walnuts, [place] nuts,pecans, peanuts, nut butters, pumpkin seeds, chia seeds, and sunflower seeds. Dairy Lactose-free milk, yogurt, and kefir. Lactose-free cottage cheese and ice cream. Non-dairy milks, such as almond, coconut, hemp, and rice milk. Non-dairy yogurt. Limited amounts of goat cheese, brie, mozzarella, parmesan, swiss, andother hard cheeses. Fats and oils Butter-free spreads. Vegetable oils, such as olive, canola, and sunflower oil. Seasoning and other foods Artificial sweeteners with names that do not end in "ol," such as aspartame, saccharine, and stevia. Maple syrup, white table sugar, raw sugar, brown sugar, and molasses. Mayonnaise, soy sauce, and tamari. Fresh basil, coriander,parsley, rosemary, and thyme. Beverages Water and mineral water. Sugar-sweetened soft drinks. Small amounts  of  orangejuice or cranberry juice. Black and green tea. Most dry wines. Coffee. The items listed above may not be a complete list of foods and beverages you can eat. Contact a dietitian for more information. Foods to avoid Fruits Fresh, dried, and juiced forms of apple, pear, watermelon, peach, plum, cherries, apricots, blackberries, boysenberries, figs, nectarines, and mango.Avocado. Vegetables Chicory root, artichoke, asparagus, cabbage, snow peas, Brussels sprouts, broccoli, sugar snap peas, mushrooms, celery, and cauliflower. Onions, garlic,leeks, and the white part of scallions. Grains Wheat, including kamut, durum, and semolina. Barley and bulgur. Couscous.Wheat-based cereals. Wheat noodles, bread, crackers, and pastries. Meats and other proteins Fried or fatty meat. Sausage. Cashews and pistachios. Soybeans, baked beans, black beans, chickpeas, kidney beans, fava beans, navy beans, lentils,black-eyed peas, and split peas. Dairy Milk, yogurt, ice cream, and soft cheese. Cream and sour cream. Milk-basedsauces. Custard. Buttermilk. Soy milk. Seasoning and other foods Any sugar-free gum or candy. Foods that contain artificial sweeteners such as sorbitol, mannitol, isomalt, or xylitol. Foods that contain honey, high-fructose corn syrup, or agave. Bouillon, vegetable stock, beef stock, and chicken stock. Garlic and onion powder. Condiments made with onion, such ashummus, chutney, pickles, relish, salad dressing, and salsa. Tomato paste. Beverages Chicory-based drinks. Coffee substitutes. Chamomile tea. Fennel tea. Sweet or fortified wines such as port or sherry. Diet soft drinks made with isomalt, mannitol, maltitol, sorbitol, or xylitol. Apple, pear, and mango juice. Juiceswith high-fructose corn syrup. The items listed above may not be a complete list of foods and beverages you should avoid. Contact a dietitian for more information. Summary FODMAP stands for fermentable oligosaccharides,  disaccharides, monosaccharides, and polyols. These are sugars that are hard for some people to digest. A low-FODMAP eating plan is a short-term diet that helps to ease symptoms of certain bowel diseases. The eating plan usually lasts up to 6 weeks. After that, high-FODMAP foods are reintroduced gradually and one at a time. This can help you find out which foods may be causing symptoms. A low-FODMAP eating plan can be complicated. It is best to work with a dietitian who has experience with this type of plan. This information is not intended to replace advice given to you by your health care provider. Make sure you discuss any questions you have with your healthcare provider. Document Revised: 09/26/2019 Document Reviewed: 09/26/2019 Elsevier Patient Education  2022 [place].  Irritable Bowel Syndrome, Adult  Irritable bowel syndrome (IBS) is a group of symptoms that affects the organs responsible for digestion (gastrointestinal or GI tract). IBS is not one specific disease. To regulate how the GI tract works, the body sends signals back and forth between the intestines and the brain. If you have IBS, there may be a problem with these signals. As a result, the GI tract does not function normally. The intestines may become more sensitive and overreact to certain things. This maybe especially true when you eat certain foods or when you are under stress. There are four types of IBS. These may be determined based on the consistency of your stool (feces): IBS with diarrhea. IBS with constipation. Mixed IBS. Unsubtyped IBS. It is important to know which type of IBS you have. Certain treatments are morelikely to be helpful for certain types of IBS. What are the causes? The exact cause of IBS is not known. What increases the risk? You may have a higher risk for IBS if you: Are male. Are younger than 68. Have a family history of IBS. Have a mental health condition, such as  depression, anxiety,  or post-traumatic stress disorder. Have had a bacterial infection of your GI tract. What are the signs or symptoms? Symptoms of IBS vary from person to person. The main symptom is abdominal pain or discomfort. Other symptoms usually include one or more of the following: Diarrhea, constipation, or both. Abdominal swelling or bloating. Feeling full after eating a small or regular-sized meal. Frequent gas. Mucus in the stool. A feeling of having more stool left after a bowel movement. Symptoms tend to come and go. They may be triggered by stress, mental healthconditions, or certain foods. How is this diagnosed? This condition may be diagnosed based on a physical exam, your medical history, and your symptoms. You may have tests, such as: Blood tests. Stool test. X-rays. CT scan. Colonoscopy. This is a procedure in which your GI tract is viewed with a long, thin, flexible tube. How is this treated? There is no cure for IBS, but treatment can help relieve symptoms. Treatment depends on the type of IBS you have, and may include: Changes to your diet, such as: Avoiding foods that cause symptoms. Drinking more water. Following a low-FODMAP (fermentable oligosaccharides, disaccharides, monosaccharides, and polyols) diet for up to 6 weeks, or as told by your health care provider. FODMAPs are sugars that are hard for some people to digest. Eating more fiber. Eating medium-sized meals at the same times every day. Medicines. These may include: Fiber supplements, if you have constipation. Medicine to control diarrhea (antidiarrheal medicines). Medicine to help control muscle tightening (spasms) in your GI tract (antispasmodic medicines). Medicines to help with mental health conditions, such as antidepressants or tranquilizers. Talk therapy or counseling. Working with a diet and nutrition specialist (dietitian) to help create a food plan that is right for you. Managing your stress. Follow these  instructions at home: Eating and drinking Eat a healthy diet. Eat medium-sized meals at about the same time every day. Do not eat large meals. Gradually eat more fiber-rich foods. These include whole grains, fruits, and vegetables. This may be especially helpful if you have IBS with constipation. Eat a diet low in FODMAPs. Drink enough fluid to keep your urine pale yellow. Keep a journal of foods that seem to trigger symptoms. Avoid foods and drinks that: Contain added sugar. Make your symptoms worse. Dairy products, caffeinated drinks, and carbonated drinks can make symptoms worse for some people. General instructions Take over-the-counter and prescription medicines and supplements only as told by your health care provider. Get enough exercise. Do at least 150 minutes of moderate-intensity exercise each week. Manage your stress. Getting enough sleep and exercise can help you manage stress. Keep all follow-up visits as told by your health care provider and therapist. This is important. Alcohol Use Do not drink alcohol if: Your health care provider tells you not to drink. You are pregnant, may be pregnant, or are planning to become pregnant. If you drink alcohol, limit how much you have: 0-1 drink a day for women. 0-2 drinks a day for men. Be aware of how much alcohol is in your drink. In the [place] one drink equals one typical bottle of beer (12 oz), one-half glass of wine (5 oz), or one shot of hard liquor (1 oz). Contact a health care provider if you have: Constant pain. Weight loss. Difficulty or pain when swallowing. Diarrhea that gets worse. Get help right away if you have: Severe abdominal pain. Fever. Diarrhea with symptoms of dehydration, such as dizziness or dry mouth. Bright red blood in your  stool. Stool that is black and tarry. Abdominal swelling. Vomiting that does not stop. Blood in your vomit. Summary Irritable bowel syndrome (IBS) is not one specific disease. It  is a group of symptoms that affects digestion. Your intestines may become more sensitive and overreact to certain things. This may be especially true when you eat certain foods or when you are under stress. There is no cure for IBS, but treatment can help relieve symptoms. This information is not intended to replace advice given to you by your health care provider. Make sure you discuss any questions you have with your healthcare provider. Document Revised: 01/09/2020 Document Reviewed: 01/09/2020 Elsevier Patient Education  2022 [place] for Irritable Bowel Syndrome When you have irritable bowel syndrome (IBS), it is very important to eat the foods and follow the eating habits that are best for your condition. IBS may cause various symptoms such as pain in the abdomen, constipation, or diarrhea. Choosing the right foods can help to ease the discomfort from these symptoms. Work with your health care provider and diet and nutrition specialist (dietitian) to find the eating plan that will help to control your symptoms. What are tips for following this plan?     Keep a food diary. This will help you identify foods that cause symptoms. Write down: What you eat and when you eat it. What symptoms you have. When symptoms occur in relation to your meals, such as "pain in abdomen 2 hours after dinner." Eat your meals slowly and in a relaxed setting. Aim to eat 5-6 small meals per day. Do not skip meals. Drink enough fluid to keep your urine pale yellow. Ask your health care provider if you should take an over-the-counter probiotic to help restore healthy bacteria in your gut (digestive tract). Probiotics are foods that contain good bacteria and yeasts. Your dietitian may have specific dietary recommendations for you based on your symptoms. He or she may recommend that you: Avoid foods that cause symptoms. Talk with your dietitian about other ways to get the same nutrients that are in those  problem foods. Avoid foods with gluten. Gluten is a protein that is found in rye, wheat, and barley. Eat more foods that contain soluble fiber. Examples of foods with high soluble fiber include oats, seeds, and certain fruits and vegetables. Take a fiber supplement if directed by your dietitian. Reduce or avoid certain foods called FODMAPs. These are foods that contain carbohydrates that are hard to digest. Ask your doctor which foods contain these carbohydrates. What foods are not recommended? The following are some foods and drinks that may make your symptoms worse: Fatty foods, such as french fries. Foods that contain gluten, such as pasta and cereal. Dairy products, such as milk, cheese, and ice cream. Chocolate. Alcohol. Products with caffeine, such as coffee. Carbonated drinks, such as soda. Foods that are high in FODMAPs. These include certain fruits and vegetables. Products with sweeteners such as honey, high fructose corn syrup, sorbitol, and mannitol. The items listed above may not be a complete list of foods and beverages youshould avoid. Contact a dietitian for more information. What foods are good sources of fiber? Your health care provider or dietitian may recommend that you eat more foods that contain fiber. Fiber can help to reduce constipation and other IBS symptoms. Add foods with fiber to your diet a little at a time so your body can get used to them. Too much fiber at one time might cause gas and swelling  of your abdomen. The following are some foods that are good sources of fiber: Berries, such as raspberries, strawberries, and blueberries. Tomatoes. Carrots. Brown rice. Oats. Seeds, such as chia and pumpkin seeds. The items listed above may not be a complete list of recommended sources offiber. Contact your dietitian for more options. Where to find more information International Foundation for Functional Gastrointestinal Disorders: www.iffgd.[employer] of  Diabetes and Digestive and Kidney Diseases: [employer] Summary When you have irritable bowel syndrome (IBS), it is very important to eat the foods and follow the eating habits that are best for your condition. IBS may cause various symptoms such as pain in the abdomen, constipation, or diarrhea. Choosing the right foods can help to ease the discomfort that comes from symptoms. Keep a food diary. This will help you identify foods that cause symptoms. Your health care provider or diet and nutrition specialist (dietitian) may recommend that you eat more foods that contain fiber. This information is not intended to replace advice given to you by your health care provider. Make sure you discuss any questions you have with your healthcare provider. Document Revised: 01/09/2020 Document Reviewed: 01/09/2020 Elsevier Patient Education  [place].  Nonspecific Chest Pain, Adult Chest pain can be caused by many different conditions. It can be caused by a condition that is life-threatening and requires treatment right away. It can also be caused by something that is not life-threatening. If you have chest pain, it can be hard to know the difference, so it is important to get helpright away to make sure that you do not have a serious condition. Some life-threatening causes of chest pain include: Heart attack. A tear in the body's main blood vessel (aortic dissection). Inflammation around your heart (pericarditis). A problem in the lungs, such as a blood clot (pulmonary embolism) or a collapsed lung (pneumothorax). Some non life-threatening causes of chest pain include: Heartburn. Anxiety or stress. Damage to the bones, muscles, and cartilage that make up your chest wall. Pneumonia or bronchitis. Shingles infection (varicella-zoster virus). Chest pain can feel like: Pain or discomfort on the surface of your chest or deep in your chest. Crushing, pressure, aching, or squeezing  pain. Burning or tingling. Dull or sharp pain that is worse when you move, cough, or take a deep breath. Pain or discomfort that is also felt in your back, neck, jaw, shoulder, or arm, or pain that spreads to any of these areas. Your chest pain may come and go. It may also be constant. Your health care provider will do lab tests and other studies to find the cause of your pain.Treatment will depend on the cause of your chest pain. Follow these instructions at home: Medicines Take over-the-counter and prescription medicines only as told by your health care provider. If you were prescribed an antibiotic, take it as told by your health care provider. Do not stop taking the antibiotic even if you start to feel better. Lifestyle  Rest as directed by your health care provider. Do not use any products that contain nicotine or tobacco, such as cigarettes and e-cigarettes. If you need help quitting, ask your health care provider. Do not drink alcohol. Make healthy lifestyle choices as recommended. These may include: Getting regular exercise. Ask your health care provider to suggest some activities that are safe for you. Eating a heart-healthy diet. This includes plenty of fresh fruits and vegetables, whole grains, low-fat (lean) protein, and low-fat dairy products. A dietitian can help you find healthy eating options.  Maintaining a healthy weight. Managing any other health conditions you have, such as high blood pressure (hypertension) or diabetes. Reducing stress, such as with yoga or relaxation techniques.  General instructions Pay attention to any changes in your symptoms. Tell your health care provider about them or any new symptoms. Avoid any activities that cause chest pain. Keep all follow-up visits as told by your health care provider. This is important. This includes visits for any further testing if your chest pain does not go away. Contact a health care provider if: Your chest pain does  not go away. You feel depressed. You have a fever. Get help right away if: Your chest pain gets worse. You have a cough that gets worse, or you cough up blood. You have severe pain in your abdomen. You faint. You have sudden, unexplained chest discomfort. You have sudden, unexplained discomfort in your arms, back, neck, or jaw. You have shortness of breath at any time. You suddenly start to sweat, or your skin gets clammy. You feel nausea or you vomit. You suddenly feel lightheaded or dizzy. You have severe weakness, or unexplained weakness or fatigue. Your heart begins to beat quickly, or it feels like it is skipping beats. These symptoms may represent a serious problem that is an emergency. Do not wait to see if the symptoms will go away. Get medical help right away. Call your local emergency services (911 in the [place] Do not drive yourself to the hospital. Summary Chest pain can be caused by a condition that is serious and requires urgent treatment. It may also be caused by something that is not life-threatening. If you have chest pain, it is very important to see your health care provider. Your health care provider may do lab tests and other studies to find the cause of your pain. Follow your health care provider's instructions on taking medicines, making lifestyle changes, and getting emergency treatment if symptoms become worse. Keep all follow-up visits as told by your health care provider. This includes visits for any further testing if your chest pain does not go away. This information is not intended to replace advice given to you by your health care provider. Make sure you discuss any questions you have with your healthcare provider. Document Revised: 11/09/2017 Document Reviewed: 11/09/2017 Elsevier Patient Education  2022 [employer].

## 2020-12-05 LAB — MICROALBUMIN / CREATININE URINE RATIO
Creatinine, Urine: 228 mg/dL (ref 20–320)
Microalb Creat Ratio: 2 mcg/mg creat (ref <30)
Microalb, Ur: 0.5 mg/dL

## 2020-12-05 LAB — SODIUM, URINE, RANDOM: Sodium, Ur: 252 mmol/L (ref 28–272)

## 2020-12-08 ENCOUNTER — Encounter: Payer: Self-pay | Admitting: Internal Medicine

## 2020-12-08 NOTE — Progress Notes (Deleted)
Follow-up Outpatient Visit Date: 12/09/2020  Primary Care Provider: [name], [name], MD [place] [id number]  Primary Cardiologist: Merideth Abbey, MD  Chief Complaint: ***  HPI:  [name] is a 45 y.o. male with history of recurrent chest pain with normal coronary arteries by LHC (09/2019), pulmonary embolism (05/2020), PSVT, Jobert syndrome, IBS, GERD, PTSD, anxiety, depression, and family history of CAD, who presents for urgent evaluation of chest pain and shortness of breath.  He was last seen in our office in February by [name], PA, at which time Mr. Habibi was recovering from his subsegmental pulmonary embolism with residual left femoral vein DVT.  He was discharged on apixaban.  It was postulated that VTE may have been precipitated by self initiated use of supplemental testosterone.  He was planning to transition from apixaban to rivaroxaban due to insurance issues.  --------------------------------------------------------------------------------------------------  Past Medical History:  Diagnosis Date   Anxiety    COVID-19    07/04/20   Depression    Enteritis    Gastritis    GERD (gastroesophageal reflux disease)    Gilbert's syndrome    hyperbilirubinemia   IBS (irritable bowel syndrome)    Inguinal hernia    Insomnia    Pre-diabetes    Prostatitis 2008   Past Surgical History:  Procedure Laterality Date   BACK SURGERY     2 ruptured discs   CHOLECYSTECTOMY  01/31/2014   GB polyp, chronic cholecystitis    ESOPHAGOGASTRODUODENOSCOPY  09/2004   gastritis acute and duodenitis   ESOPHAGOGASTRODUODENOSCOPY  01-14-14   Dr Allen Norris   EXPLORATORY LAPAROTOMY  Jan 2015   Dr Burt Knack   LASIK     RIGHT/LEFT HEART CATH [place] N/A 10/18/2019   Procedure: RIGHT/LEFT HEART CATH AND CORONARY ANGIOGRAPHY;  Surgeon: Wellington Hampshire, MD;  Location: [facility];  Service: Cardiovascular;  Laterality: N/A;    No outpatient  medications have been marked as taking for the 12/09/20 encounter (Appointment) with Karlton Maya, Harrell Gave, MD.    Allergies: Morphine and related, Testosterone, Morphine, and Tdap [tetanus-diphth-acell pertussis]  Social History   Tobacco Use   Smoking status: Never   Smokeless tobacco: Never  Vaping Use   Vaping Use: Never used  Substance Use Topics   Alcohol use: Yes    Comment: occassional social   Drug use: No    Family History  Problem Relation Age of Onset   Colon polyps Mother    Cancer Mother        sinus cavity, on XRT   Asthma Mother    Heart disease Father    Heart attack Father        x 2   Pancreatic cancer Maternal Grandfather    Colon cancer Maternal Grandfather    Diabetes Other        strong FH d both sides of family    Heart Problems Paternal Grandfather    Asthma Brother    Cancer Maternal Uncle    Stroke Cousin        age 87/44   Mental illness Neg Hx     Review of Systems: A 12-system review of systems was performed and was negative except as noted in the HPI.  --------------------------------------------------------------------------------------------------  Physical Exam: There were no vitals taken for this visit.  General:  NAD. Neck: No JVD or HJR. Lungs: Clear to auscultation bilaterally without wheezes or crackles. Heart: Regular rate and rhythm without murmurs, rubs, or gallops. Abdomen: Soft, nontender,  nondistended. Extremities: No lower extremity edema.  EKG:  ***  Lab Results  Component Value Date   WBC 6.1 11/19/2020   HGB 16.4 11/19/2020   HCT 47.1 11/19/2020   MCV 90.2 11/19/2020   PLT 252 11/19/2020    Lab Results  Component Value Date   NA 140 12/04/2020   K 4.3 12/04/2020   CL 103 12/04/2020   CO2 30 12/04/2020   BUN 18 12/04/2020   CREATININE 1.10 12/04/2020   GLUCOSE 90 12/04/2020   ALT 18 12/04/2020    Lab Results  Component Value Date   CHOL 183 12/19/2019   HDL 41.40 12/19/2019   LDLCALC 124 (H)  12/19/2019   TRIG 90.0 12/19/2019   CHOLHDL 4 12/19/2019    --------------------------------------------------------------------------------------------------  ASSESSMENT AND PLAN: Harrell Gave Aspyn Warnke, MD 12/08/2020 1:01 PM

## 2020-12-09 ENCOUNTER — Ambulatory Visit: Payer: BC Managed Care – PPO | Admitting: Internal Medicine

## 2020-12-09 NOTE — Telephone Encounter (Signed)
Please advise 

## 2020-12-10 ENCOUNTER — Encounter: Payer: Self-pay | Admitting: Internal Medicine

## 2020-12-16 ENCOUNTER — Other Ambulatory Visit: Payer: Self-pay

## 2020-12-16 ENCOUNTER — Telehealth (INDEPENDENT_AMBULATORY_CARE_PROVIDER_SITE_OTHER): Payer: BC Managed Care – PPO | Admitting: Psychiatry

## 2020-12-16 DIAGNOSIS — Z5329 Procedure and treatment not carried out because of patient's decision for other reasons: Secondary | ICD-10-CM

## 2020-12-16 NOTE — Progress Notes (Signed)
No response to call or text or video invite  

## 2020-12-20 ENCOUNTER — Other Ambulatory Visit: Payer: Self-pay | Admitting: Internal Medicine

## 2020-12-20 DIAGNOSIS — R112 Nausea with vomiting, unspecified: Secondary | ICD-10-CM

## 2021-01-12 ENCOUNTER — Other Ambulatory Visit: Payer: Self-pay | Admitting: Child and Adolescent Psychiatry

## 2021-01-12 DIAGNOSIS — F4310 Post-traumatic stress disorder, unspecified: Secondary | ICD-10-CM

## 2021-01-12 NOTE — Telephone Encounter (Signed)
Dr. Eappen's pt

## 2021-01-13 ENCOUNTER — Ambulatory Visit: Payer: BC Managed Care – PPO | Admitting: Dermatology

## 2021-01-15 ENCOUNTER — Other Ambulatory Visit: Payer: Self-pay | Admitting: Psychiatry

## 2021-01-15 DIAGNOSIS — F5105 Insomnia due to other mental disorder: Secondary | ICD-10-CM

## 2021-01-15 DIAGNOSIS — F4310 Post-traumatic stress disorder, unspecified: Secondary | ICD-10-CM

## 2021-06-01 ENCOUNTER — Institutional Professional Consult (permissible substitution): Payer: BC Managed Care – PPO | Admitting: Pulmonary Disease

## 2021-06-04 ENCOUNTER — Other Ambulatory Visit: Payer: Self-pay

## 2021-06-04 ENCOUNTER — Encounter: Payer: Self-pay | Admitting: Emergency Medicine

## 2021-06-04 ENCOUNTER — Ambulatory Visit
Admission: EM | Admit: 2021-06-04 | Discharge: 2021-06-04 | Disposition: A | Discharge status: Home / Self Care | Payer: BC Managed Care – PPO | Attending: Emergency Medicine | Admitting: Emergency Medicine

## 2021-06-04 DIAGNOSIS — R079 Chest pain, unspecified: Secondary | ICD-10-CM | POA: Diagnosis not present

## 2021-06-04 DIAGNOSIS — L239 Allergic contact dermatitis, unspecified cause: Secondary | ICD-10-CM

## 2021-06-04 DIAGNOSIS — L509 Urticaria, unspecified: Secondary | ICD-10-CM | POA: Diagnosis not present

## 2021-06-04 DIAGNOSIS — R220 Localized swelling, mass and lump, head: Secondary | ICD-10-CM

## 2021-06-04 MED ORDER — PREDNISONE 10 MG (21) PO TBPK: ORAL_TABLET | Freq: Every day | ORAL | 0 refills | Status: DC

## 2021-06-04 MED ORDER — METHYLPREDNISOLONE SODIUM SUCC 125 MG IJ SOLR
80.0000 mg | Freq: Once | INTRAMUSCULAR | Status: AC
  Administered 2021-06-04: 80 mg via INTRAMUSCULAR

## 2021-06-04 NOTE — ED Triage Notes (Signed)
Pt states for the past 2 nights he has had chest pain that would go away after a while. This  morning he woke with a rash and by this afternoon his lips were swelling. Pt denies any difficulty breathing.

## 2021-06-04 NOTE — Discharge Instructions (Addendum)
You were given an injection of a steroid called Solu-Medrol.  Start the prednisone taper tomorrow as directed.    Take Benadryl every 6 hours as directed; do not drive, operate machinery, or drink alcohol with this medication as it may cause drowsiness.    Call 911 and go to the emergency department if you have difficulty swallowing or breathing; Or if you have chest pain or other concerning symptoms.   Follow up with your primary care provider next week.

## 2021-06-04 NOTE — ED Provider Notes (Signed)
[facility]    CSN: [id number] Arrival date & time: 06/04/21  1430      History   Chief Complaint Chief Complaint  Patient presents with   Rash   Allergic Reaction    HPI Sergio Garcia is a 46 y.o. male.  Patient presents with rash since this morning and swelling of his lips since lunchtime.  No difficulty swallowing or breathing.  The rash is hive-like, mildly pruritic, and nontender.  The rash was present when he woke up this morning and improved after he took 2 tablets of Zyrtec at 7 AM.  He also reports intermittent chest pain for the last 2 days; None currently.  He denies shortness of breath, weakness, numbness, dizziness, headache, or other symptoms.  Patient has a history of chest pain, pulmonary embolism, DVT, paroxysmal supraventricular tachycardia, anxiety, panic attacks, PTSD.    The history is provided by the patient and medical records.   Past Medical History:  Diagnosis Date   Anxiety    COVID-19    07/04/20   Depression    Enteritis    Gastritis    GERD (gastroesophageal reflux disease)    Gilbert's syndrome    hyperbilirubinemia   IBS (irritable bowel syndrome)    Inguinal hernia    Insomnia    Pre-diabetes    Prostatitis 2008    Patient Active Problem List   Diagnosis Date Noted   Acute deep vein thrombosis (DVT) of proximal vein of left lower extremity ([place]) 06/22/2020   Pulmonary embolism ([place]) 06/18/2020   Elevated blood pressure reading 06/18/2020   No-show for appointment 06/09/2020   PSVT (paroxysmal supraventricular tachycardia) ([place]) 12/26/2019   Bradycardia 12/26/2019   Hyperlipidemia 12/19/2019   Panic attacks 11/21/2019   Mucous retention cyst of maxillary sinus 02/21/2019   Weight loss, non-intentional 02/21/2019   Generalized anxiety disorder 02/14/2019   Prediabetes 01/29/2019   PTSD (post-traumatic stress disorder) 01/23/2019   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   Elevated bilirubin 03/07/2018   Palpitations  03/07/2018   PVC's (premature ventricular contractions) 03/07/2018   Back pain 10/06/2016   Routine general medical examination at a health care facility 06/08/2015   Exertional chest pain 10/07/2014   Anxiety 01/23/2014   Elevated LFTs 07/26/2013   Chronic prostatitis 02/26/2013   Orchalgia 02/26/2013   Other procreative management counseling and advice 02/26/2013   Shortness of breath 12/25/2012   GERD (gastroesophageal reflux disease) 12/20/2012   Chest pain 12/18/2012   ANTERIOR PITUITARY HYPERFUNCTION 11/13/2009   Inguinal hernia, right 10/01/2009   GILBERT'S SYNDROME 09/28/2009   Irritable bowel syndrome with diarrhea 09/28/2009   HERNIATED LUMBAR DISK WITH RADICULOPATHY 11/14/2007   Allergic rhinitis 08/30/2007    Past Surgical History:  Procedure Laterality Date   BACK SURGERY     2 ruptured discs   CHOLECYSTECTOMY  01/31/2014   GB polyp, chronic cholecystitis    ESOPHAGOGASTRODUODENOSCOPY  09/2004   gastritis acute and duodenitis   ESOPHAGOGASTRODUODENOSCOPY  01-14-14   Dr Allen Norris   EXPLORATORY LAPAROTOMY  Jan 2015   Dr Burt Knack   LASIK     RIGHT/LEFT HEART CATH [place] N/A 10/18/2019   Procedure: RIGHT/LEFT HEART CATH AND CORONARY ANGIOGRAPHY;  Surgeon: Wellington Hampshire, MD;  Location: [facility];  Service: Cardiovascular;  Laterality: N/A;       Home Medications    Prior to Admission medications   Medication Sig Start Date End Date Taking? Authorizing Provider  cetirizine (ZYRTEC) 10 MG tablet  Take 10 mg by mouth daily.   Yes [provider]  clonazePAM (KLONOPIN) 0.5 MG tablet TAKE 1 TABLET (0.5 MG TOTAL) BY MOUTH 2 TIMES DAILY AS NEEDED FOR ANXIETY. 04/21/20  Yes Eappen, Ria Clock, MD  EPINEPHrine 0.3 mg/0.3 mL IJ SOAJ injection Inject into the muscle. 08/14/20  Yes [provider]  FLUoxetine (PROZAC) 40 MG capsule Take 1 capsule (40 mg total) by mouth daily. 08/31/20  Yes Eappen, Ria Clock, MD  fluticasone (FLONASE) 50  MCG/ACT nasal spray Place 2 sprays into both nostrils daily. 12/04/20  Yes [name], [name], MD  predniSONE (STERAPRED UNI-PAK 21 TAB) 10 MG (21) TBPK tablet Take by mouth daily. As directed 06/05/21  Yes Sharion Balloon, NP  rivaroxaban (XARELTO) 20 MG TABS tablet Take 1 tablet (20 mg total) by mouth daily with supper. 12/04/20  Yes [name], [name], MD  SUMAtriptan (IMITREX) 50 MG tablet TAKE 1 TABLET AS NEEDED FOR MIGRAINE-MAY REPEAT IN 2 HR IF HEADACHE PERSISTS(MAX 2/DAY) 12/04/20  Yes [name], [name], MD  traZODone (DESYREL) 50 MG tablet Take 0.5-1 tablets (25-50 mg total) by mouth at bedtime as needed. for sleep 11/19/20  Yes Orlene Erm, MD  albuterol (VENTOLIN HFA) 108 (90 Base) MCG/ACT inhaler Inhale 1-2 puffs into the lungs every 6 (six) hours as needed for wheezing or shortness of breath. 06/17/20   [name], [name], MD  ondansetron (ZOFRAN) 4 MG tablet TAKE 1 TABLET (4 MG TOTAL) BY MOUTH 2 (TWO) TIMES DAILY AS NEEDED FOR NAUSEA OR VOMITING. 12/22/20   [name], [name], MD  pantoprazole (PROTONIX) 40 MG tablet Take 1 tablet (40 mg total) by mouth daily. 30 min before food 11/24/20   [name], [name], MD  QUEtiapine (SEROQUEL) 50 MG tablet Take half tablet along with Trazodone or skip seroquel few times a week and take seroquel instead 08/31/20   Ursula Alert, MD  sodium chloride (OCEAN) 0.65 % SOLN nasal spray Place 1 spray into both nostrils as needed for congestion. 12/04/20   [name], [name], MD    Family History Family History  Problem Relation Age of Onset   Colon polyps Mother    Cancer Mother        sinus cavity, on XRT   Asthma Mother    Heart disease Father    Heart attack Father        x 2   Pancreatic cancer Maternal Grandfather    Colon cancer Maternal Grandfather    Diabetes Other        strong FH d both sides of family    Heart Problems Paternal Grandfather    Asthma Brother    Cancer Maternal Uncle    Stroke  Cousin        age 25/44   Mental illness Neg Hx     Social History Social History   Tobacco Use   Smoking status: Never   Smokeless tobacco: Never  Vaping Use   Vaping Use: Never used  Substance Use Topics   Alcohol use: Yes    Comment: occassional social   Drug use: No     Allergies   Morphine and related, Testosterone, Morphine, and Tdap [tetanus-diphth-acell pertussis]   Review of Systems Review of Systems  Constitutional:  Negative for chills and fever.  HENT:  Positive for facial swelling. Negative for ear pain and sore throat.   Respiratory:  Negative for cough and shortness of breath.   Cardiovascular:  Negative for chest pain and palpitations.  Gastrointestinal:  Negative for abdominal pain, diarrhea, nausea and vomiting.  Skin:  Positive for rash. Negative for color change.  All other systems reviewed and are negative.   Physical Exam Triage Vital Signs ED Triage Vitals  Enc Vitals Group     BP      Pulse      Resp      Temp      Temp src      SpO2      Weight      Height      Head Circumference      Peak Flow      Pain Score      Pain Loc      Pain Edu?      Excl. in [place]?    No data found.  Updated Vital Signs BP 117/81 (BP Location: Left Arm)    Pulse 95    Temp 98.2 F (36.8 C)    Resp 18    SpO2 98%   Visual Acuity Right Eye Distance:   Left Eye Distance:   Bilateral Distance:    Right Eye Near:   Left Eye Near:    Bilateral Near:     Physical Exam Vitals and nursing note reviewed.  Constitutional:      General: He is not in acute distress.    Appearance: He is well-developed. He is not ill-appearing.  HENT:     Mouth/Throat:     Mouth: Mucous membranes are moist.     Pharynx: Oropharynx is clear.     Comments: Mild edema of lips.  Speech clear.  No oropharyngeal swelling.  No difficulty swallowing. Cardiovascular:     Rate and Rhythm: Normal rate and regular rhythm.     Heart sounds: No murmur heard. Pulmonary:      Effort: Pulmonary effort is normal. No respiratory distress.     Breath sounds: Normal breath sounds.     Comments: Good air movement.  Musculoskeletal:     Cervical back: Neck supple.  Skin:    General: Skin is warm and dry.     Findings: Rash present.     Comments: Hives on trunk, primarily on right flank. Also few hives on extremities.   Neurological:     Mental Status: He is alert.  Psychiatric:        Mood and Affect: Mood normal.        Behavior: Behavior normal.     UC Treatments / Results  Labs (all labs ordered are listed, but only abnormal results are displayed) Labs Reviewed - No data to display  EKG   Radiology No results found.  Procedures Procedures (including critical care time)  Medications Ordered in UC Medications  methylPREDNISolone sodium succinate (SOLU-MEDROL) 125 mg/2 mL injection 80 mg (80 mg Intramuscular Given 06/04/21 1515)    Initial Impression / Assessment and Plan / UC Course  I have reviewed the triage vital signs and the nursing notes.  Pertinent labs & imaging results that were available during my care of the patient were reviewed by me and considered in my medical decision making (see chart for details).  Facial swelling, hives, allergic dermatitis, chest pain.  No difficulty swallowing or breathing.  Lungs are clear, O2 sat 98%.  EKG shows sinus rhythm, rate 68, no ST elevation, compared to previous from 12/04/2020 and unchanged.  Treating with Solu-Medrol; start prednisone taper tomorrow.  Instructed patient to take Benadryl every 6 hours precautions for drowsiness with this medication discussed.  Strict  ED precautions discussed.  Instructed him to follow-up with his PCP next week.  Patient agrees to plan of care   Final Clinical Impressions(s) / UC Diagnoses   Final diagnoses:  Facial swelling  Allergic dermatitis  Hives  Chest pain, unspecified type     Discharge Instructions      You were given an injection of a steroid  called Solu-Medrol.  Start the prednisone taper tomorrow as directed.    Take Benadryl every 6 hours as directed; do not drive, operate machinery, or drink alcohol with this medication as it may cause drowsiness.    Call 911 and go to the emergency department if you have difficulty swallowing or breathing; Or if you have chest pain or other concerning symptoms.   Follow up with your primary care provider next week.          ED Prescriptions     Medication Sig Dispense Auth. Provider   predniSONE (STERAPRED UNI-PAK 21 TAB) 10 MG (21) TBPK tablet Take by mouth daily. As directed 21 tablet Sharion Balloon, NP      PDMP not reviewed this encounter.   Sharion Balloon, NP 06/04/21 1538

## 2021-06-06 ENCOUNTER — Other Ambulatory Visit: Payer: Self-pay

## 2021-06-06 ENCOUNTER — Emergency Department
Admission: EM | Admit: 2021-06-06 | Discharge: 2021-06-06 | Disposition: A | Discharge status: Home / Self Care | Payer: BC Managed Care – PPO | Attending: Emergency Medicine | Admitting: Emergency Medicine

## 2021-06-06 ENCOUNTER — Emergency Department: Payer: BC Managed Care – PPO

## 2021-06-06 ENCOUNTER — Encounter: Payer: Self-pay | Admitting: Emergency Medicine

## 2021-06-06 DIAGNOSIS — L509 Urticaria, unspecified: Secondary | ICD-10-CM | POA: Insufficient documentation

## 2021-06-06 DIAGNOSIS — B09 Unspecified viral infection characterized by skin and mucous membrane lesions: Secondary | ICD-10-CM | POA: Insufficient documentation

## 2021-06-06 DIAGNOSIS — Z8616 Personal history of COVID-19: Secondary | ICD-10-CM | POA: Diagnosis not present

## 2021-06-06 DIAGNOSIS — R21 Rash and other nonspecific skin eruption: Secondary | ICD-10-CM | POA: Diagnosis present

## 2021-06-06 LAB — CBC
HCT: 49.1 % (ref 39.0–52.0)
Hemoglobin: 16.1 g/dL (ref 13.0–17.0)
MCH: 30.7 pg (ref 26.0–34.0)
MCHC: 32.8 g/dL (ref 30.0–36.0)
MCV: 93.7 fL (ref 80.0–100.0)
Platelets: 324 10*3/uL (ref 150–400)
RBC: 5.24 MIL/uL (ref 4.22–5.81)
RDW: 12.8 % (ref 11.5–15.5)
WBC: 18.8 10*3/uL — ABNORMAL HIGH (ref 4.0–10.5)
nRBC: 0 % (ref 0.0–0.2)

## 2021-06-06 LAB — BASIC METABOLIC PANEL
Anion gap: 9 (ref 5–15)
BUN: 24 mg/dL — ABNORMAL HIGH (ref 6–20)
CO2: 27 mmol/L (ref 22–32)
Calcium: 9.1 mg/dL (ref 8.9–10.3)
Chloride: 104 mmol/L (ref 98–111)
Creatinine, Ser: 1.19 mg/dL (ref 0.61–1.24)
GFR, Estimated: >60 mL/min (ref >60)
Glucose, Bld: 196 mg/dL — ABNORMAL HIGH (ref 70–99)
Potassium: 3.7 mmol/L (ref 3.5–5.1)
Sodium: 140 mmol/L (ref 135–145)

## 2021-06-06 LAB — TROPONIN I (HIGH SENSITIVITY): Troponin I (High Sensitivity): 4 ng/L (ref <18)

## 2021-06-06 MED ORDER — CETIRIZINE HCL 10 MG PO TABS: 20.0000 mg | ORAL_TABLET | Freq: Two times a day (BID) | ORAL | 0 refills | Status: DC

## 2021-06-06 MED ORDER — PREDNISONE 20 MG PO TABS: ORAL_TABLET | ORAL | 0 refills | Status: AC

## 2021-06-06 MED ORDER — FAMOTIDINE 20 MG PO TABS: 40.0000 mg | ORAL_TABLET | Freq: Two times a day (BID) | ORAL | 0 refills | Status: DC

## 2021-06-06 NOTE — ED Triage Notes (Signed)
Pt via EMS from home. Pt had a rash on Thursday. Pt went to UC on Friday that prednisone and told him to take Benadryl q6h. Since then pt states the rash is now getting worse. Rash is itchy. States that this AM states his throat was getting tight. States that is better now. Denies SOB now.   EMS gave Epi pen and Solu-Medrol. Pt is A&Ox4 and NAd  Denies any use of anything new detergent.

## 2021-06-06 NOTE — Discharge Instructions (Addendum)
Your rash is likely induced from a virus.  Please start Pepcid and Zyrtec which are antihistamines at the increased dosages to help with the itch.  I have prescribed you a 6-day prednisone taper which you can take.  Please follow-up with Dr. Laurence Ferrari with dermatology.

## 2021-06-06 NOTE — ED Triage Notes (Signed)
Pt in via EMS from home with c/o allergic reaction. Pt reports has been dx'd with an allergy to an unknown substance. Pt reports woke up with hives all over. Pt took benadry at home. EMS gave 0.3 epi and 125mg  of solumedrol, HR 104, 95% RA, 134/82, FSBS 176, #18 to left [facility]

## 2021-06-06 NOTE — ED Provider Notes (Signed)
[facility] Provider Note    Event Date/Time   First MD Initiated Contact with Patient 06/06/21 1213     (approximate)   History   Allergic Reaction   HPI  Sergio Garcia is a 46 y.o. male with past medical history of GERD, IBS, depression who presents with rash. Symptoms started Thursday 1/12, rash on his back.  He initially went to urgent care and was prescribed prednisone and Benadryl which he has been taking.  The rash has since spread and is now involving his extremities bilateral arms face.  Denies any oral lesions.  Denies difficulty swallowing or dyspnea.  He did have some lip swelling 2 days ago which is resolved.  Rash is extremely pruritic but nonpainful.  Denies any recent illness fevers or chills.  No new medications.  Denies any recent new allergic exposures.  Reviewed patient's note from urgent care on 06/04/2021, where he was found to have urticaria treated with prednisone and Benadryl.  Does note that yesterday he had an episode of heartburn which described as pressure was nonradiating without associated symptoms.  This is since resolved he has no ongoing chest pain.    Past Medical History:  Diagnosis Date   Anxiety    COVID-19    07/04/20   Depression    Enteritis    Gastritis    GERD (gastroesophageal reflux disease)    Gilbert's syndrome    hyperbilirubinemia   IBS (irritable bowel syndrome)    Inguinal hernia    Insomnia    Pre-diabetes    Prostatitis 2008    Patient Active Problem List   Diagnosis Date Noted   Acute deep vein thrombosis (DVT) of proximal vein of left lower extremity ([place]) 06/22/2020   Pulmonary embolism ([place]) 06/18/2020   Elevated blood pressure reading 06/18/2020   No-show for appointment 06/09/2020   PSVT (paroxysmal supraventricular tachycardia) ([place]) 12/26/2019   Bradycardia 12/26/2019   Hyperlipidemia 12/19/2019   Panic attacks 11/21/2019   Mucous retention cyst of maxillary sinus 02/21/2019    Weight loss, non-intentional 02/21/2019   Generalized anxiety disorder 02/14/2019   Prediabetes 01/29/2019   PTSD (post-traumatic stress disorder) 01/23/2019   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   Elevated bilirubin 03/07/2018   Palpitations 03/07/2018   PVC's (premature ventricular contractions) 03/07/2018   Back pain 10/06/2016   Routine general medical examination at a health care facility 06/08/2015   Exertional chest pain 10/07/2014   Anxiety 01/23/2014   Elevated LFTs 07/26/2013   Chronic prostatitis 02/26/2013   Orchalgia 02/26/2013   Other procreative management counseling and advice 02/26/2013   Shortness of breath 12/25/2012   GERD (gastroesophageal reflux disease) 12/20/2012   Chest pain 12/18/2012   ANTERIOR PITUITARY HYPERFUNCTION 11/13/2009   Inguinal hernia, right 10/01/2009   GILBERT'S SYNDROME 09/28/2009   Irritable bowel syndrome with diarrhea 09/28/2009   HERNIATED LUMBAR DISK WITH RADICULOPATHY 11/14/2007   Allergic rhinitis 08/30/2007     Physical Exam  Triage Vital Signs: ED Triage Vitals  Enc Vitals Group     BP 06/06/21 1024 (!) 152/89     Pulse Rate 06/06/21 1024 92     Resp 06/06/21 1024 20     Temp 06/06/21 1024 98.6 F (37 C)     Temp Source 06/06/21 1024 Oral     SpO2 06/06/21 1024 92 %     Weight 06/06/21 1025 200 lb (90.7 kg)     Height 06/06/21 1025 6\' 2"  (1.88 m)  Head Circumference --      Peak Flow --      Pain Score 06/06/21 1024 0     Pain Loc --      Pain Edu? --      Excl. in [place]? --     Most recent vital signs: Vitals:   06/06/21 1300 06/06/21 1448  BP:  118/80  Pulse: 67 76  Resp:  18  Temp:    SpO2: 94% 93%     General: Awake, no distress.  CV:  Good peripheral perfusion.  Resp:  Normal effort.  Abd:  No distention.  Neuro:             Awake, Alert, Oriented x 3  Other:  Patient has a macular papular rash that is on the trunk back and upper extremities Urticarial appearing lesions almost confluent  on the upper thighs and hip area No involvement of the palms or soles   ED Results / Procedures / Treatments  Labs (all labs ordered are listed, but only abnormal results are displayed) Labs Reviewed  CBC - Abnormal; Notable for the following components:      Result Value   WBC 18.8 (*)    All other components within normal limits  BASIC METABOLIC PANEL - Abnormal; Notable for the following components:   Glucose, Bld 196 (*)    BUN 24 (*)    All other components within normal limits  TROPONIN I (HIGH SENSITIVITY)  TROPONIN I (HIGH SENSITIVITY)     EKG  EKG interpreted by self, normal axis, normal intervals, T wave inversion in lead II, III, aVF, V4 V5 V6   RADIOLOGY    PROCEDURES:  Critical Care performed: No  Procedures  The patient is on the cardiac monitor to evaluate for evidence of arrhythmia and/or significant heart rate changes.   MEDICATIONS ORDERED IN ED: Medications - No data to display   IMPRESSION / MDM / [place] / ED COURSE  I reviewed the triage vital signs and the nursing notes.                              Differential diagnosis includes, but is not limited to, viral exanthem, idiopathic urticaria, allergic reaction, drug rash  Patient is a 46 year old male who presents with a rash.  Has already been seen at urgent care and has been on 10 mg of prednisone and Benadryl which is not really helping.  On exam he has a maculopapular rash on his torso and arms with does not to me look urticarial and then on his legs he has a very diffuse urticarial appearing rash.  He has no involvement of his mucosal membranes and he has no other symptoms of anaphylaxis including difficulty swallowing shortness of breath etc.  Of note he did receive IM epinephrine with EMS because apparently he had complained of some difficulty swallowing however he has no symptoms of this now given the timeframe is really not consistent with anaphylaxis.  I suspect that this  is likely a viral mediated process and that he has a both a viral exanthem and urticaria.  We will treat with high-dose antihistamines, cetirizine and Pepcid as well as a 6-day prednisone taper at a higher dose than he was given at urgent care.  Will refer to dermatology as well.  Note patient did have an episode of chest pain yesterday which he thought was GERD lasted for about an hour  and has been asymptomatic since.  He has T wave inversions in the inferior leads on his EKG which are new today however he has no ongoing chest pain and his troponin is negative.  Otherwise has no ACS risk factors.  He does have a history of DVT and is not anticoagulated but given he has no ongoing shortness of breath or chest pain think this is less likely. Clinical Course as of 06/06/21 1638  Sun Jun 06, 2021  1222 WBC(!): 18.8 [KM]  1249 Troponin I (High Sensitivity): 4 [KM]    Clinical Course User Index [KM] Rada Hay, MD     FINAL CLINICAL IMPRESSION(S) / ED DIAGNOSES   Final diagnoses:  Urticaria  Viral exanthem     Rx / DC Orders   ED Discharge Orders          Ordered    cetirizine (ZYRTEC ALLERGY) 10 MG tablet  2 times daily        06/06/21 1322    famotidine (PEPCID) 20 MG tablet  2 times daily        06/06/21 1322    predniSONE (DELTASONE) 20 MG tablet        06/06/21 1322             Note:  This document was prepared using Dragon voice recognition software and may include unintentional dictation errors.   Rada Hay, MD 06/06/21 [phone]

## 2021-06-10 ENCOUNTER — Ambulatory Visit: Payer: BC Managed Care – PPO | Admitting: Internal Medicine

## 2021-06-10 ENCOUNTER — Encounter: Payer: Self-pay | Admitting: Internal Medicine

## 2021-06-10 ENCOUNTER — Other Ambulatory Visit: Payer: Self-pay

## 2021-06-10 VITALS — BP 112/78 | HR 98 | Temp 98.0°F | Ht 74.0 in | Wt 205.8 lb

## 2021-06-10 DIAGNOSIS — L509 Urticaria, unspecified: Secondary | ICD-10-CM

## 2021-06-10 DIAGNOSIS — K219 Gastro-esophageal reflux disease without esophagitis: Secondary | ICD-10-CM

## 2021-06-10 DIAGNOSIS — D72829 Elevated white blood cell count, unspecified: Secondary | ICD-10-CM | POA: Diagnosis not present

## 2021-06-10 DIAGNOSIS — L299 Pruritus, unspecified: Secondary | ICD-10-CM | POA: Diagnosis not present

## 2021-06-10 MED ORDER — CLOBETASOL PROPIONATE 0.05 % EX CREA: 1.0000 "application " | TOPICAL_CREAM | Freq: Two times a day (BID) | CUTANEOUS | 1 refills | Status: DC

## 2021-06-10 MED ORDER — HYDROCORTISONE 2.5 % EX CREA: TOPICAL_CREAM | Freq: Two times a day (BID) | CUTANEOUS | 2 refills | Status: DC

## 2021-06-10 MED ORDER — HYDROXYZINE HCL 25 MG PO TABS: 12.5000 mg | ORAL_TABLET | Freq: Three times a day (TID) | ORAL | 2 refills | Status: DC | PRN

## 2021-06-10 NOTE — Progress Notes (Signed)
Chief Complaint  Patient presents with   Rash   1. Ed and [facility] f/u for rash and Jerrye Bushy gerd going better was not being strict with foods thurs prior to visit rash started with itching started on back and gerd in the am 2 am though on protonix 40 mg given steroid shot at [facility] 06/04/21 and oral steroids and in ed [facility] 06/06/21 given zyrtec pepcid, prednisone increased and he also had lip swelling Friday and throat closing no new foods/meds/products  Rash is so itching cant sleep at night   Review of Systems  Constitutional:  Negative for weight loss.  HENT:  Negative for hearing loss.   Eyes:  Negative for blurred vision.  Respiratory:  Negative for shortness of breath.   Cardiovascular:  Negative for chest pain.  Gastrointestinal:  Negative for abdominal pain and blood in stool.  Musculoskeletal:  Negative for back pain.  Skin:  Positive for itching and rash.  Neurological:  Negative for headaches.  Psychiatric/Behavioral:  Negative for depression.   Past Medical History:  Diagnosis Date   Anxiety    COVID-19    07/04/20   Depression    Enteritis    Gastritis    GERD (gastroesophageal reflux disease)    Gilbert's syndrome    hyperbilirubinemia   IBS (irritable bowel syndrome)    Inguinal hernia    Insomnia    Pre-diabetes    Prostatitis 2008   Past Surgical History:  Procedure Laterality Date   BACK SURGERY     2 ruptured discs   CHOLECYSTECTOMY  01/31/2014   GB polyp, chronic cholecystitis    ESOPHAGOGASTRODUODENOSCOPY  09/2004   gastritis acute and duodenitis   ESOPHAGOGASTRODUODENOSCOPY  01-14-14   Dr Allen Norris   EXPLORATORY LAPAROTOMY  Jan 2015   Dr Burt Knack   LASIK     RIGHT/LEFT HEART CATH [place] N/A 10/18/2019   Procedure: RIGHT/LEFT HEART CATH AND CORONARY ANGIOGRAPHY;  Surgeon: Wellington Hampshire, MD;  Location: [place] [facility];  Service: Cardiovascular;  Laterality: N/A;   Family History  Problem Relation Age of Onset   Colon polyps Mother    Cancer  Mother        sinus cavity, on XRT   Asthma Mother    Heart disease Father    Heart attack Father        x 2   Pancreatic cancer Maternal Grandfather    Colon cancer Maternal Grandfather    Diabetes Other        strong FH d both sides of family    Heart Problems Paternal Grandfather    Asthma Brother    Cancer Maternal Uncle    Stroke Cousin        age 55/44   Mental illness Neg Hx    Social History   Socioeconomic History   Marital status: Divorced    Spouse name: Not on file   Number of children: 2   Years of education: Not on file   Highest education level: High school graduate  Occupational History   Occupation: Music therapist: [employer]  Tobacco Use   Smoking status: Never   Smokeless tobacco: Never  Vaping Use   Vaping Use: Never used  Substance and Sexual Activity   Alcohol use: Yes    Comment: occassional social   Drug use: No   Sexual activity: Yes  Other Topics Concern   Not on file  Social History Narrative   Married.  Lives in [place] bus Deal[place]    2 children.   Enjoys watching racing, Dealer, deer hunting.             Social Determinants of Health   Financial Resource Strain: Low Risk    Difficulty of Paying Living Expenses: Not hard at all  Food Insecurity: Not on file  Transportation Needs: Not on file  Physical Activity: Not on file  Stress: Not on file  Social Connections: Not on file  Intimate Partner Violence: Not on file   Current Meds  Medication Sig   albuterol (VENTOLIN HFA) 108 (90 Base) MCG/ACT inhaler Inhale 1-2 puffs into the lungs every 6 (six) hours as needed for wheezing or shortness of breath.   cetirizine (ZYRTEC ALLERGY) 10 MG tablet Take 2 tablets (20 mg total) by mouth 2 (two) times daily for 10 days.   cetirizine (ZYRTEC) 10 MG tablet Take 10 mg by mouth daily.   clobetasol cream (TEMOVATE) 4.09 % Apply 1 application topically 2 (two) times daily. Body prn itching    EPINEPHrine 0.3 mg/0.3 mL IJ SOAJ injection Inject into the muscle.   famotidine (PEPCID) 20 MG tablet Take 2 tablets (40 mg total) by mouth 2 (two) times daily for 10 days.   FLUoxetine (PROZAC) 40 MG capsule Take 1 capsule (40 mg total) by mouth daily.   fluticasone (FLONASE) 50 MCG/ACT nasal spray Place 2 sprays into both nostrils daily.   hydrocortisone 2.5 % cream Apply topically 2 (two) times daily. Face and neck if itching   hydrOXYzine (ATARAX) 25 MG tablet Take 0.5-1 tablets (12.5-25 mg total) by mouth 3 (three) times daily as needed.   ondansetron (ZOFRAN) 4 MG tablet TAKE 1 TABLET (4 MG TOTAL) BY MOUTH 2 (TWO) TIMES DAILY AS NEEDED FOR NAUSEA OR VOMITING.   pantoprazole (PROTONIX) 40 MG tablet Take 1 tablet (40 mg total) by mouth daily. 30 min before food   predniSONE (DELTASONE) 20 MG tablet Take 3 tablets (60 mg total) by mouth daily with breakfast for 2 days, THEN 2 tablets (40 mg total) daily with breakfast for 2 days, THEN 1 tablet (20 mg total) daily with breakfast for 2 days.   rivaroxaban (XARELTO) 20 MG TABS tablet Take 1 tablet (20 mg total) by mouth daily with supper.   sodium chloride (OCEAN) 0.65 % SOLN nasal spray Place 1 spray into both nostrils as needed for congestion.   SUMAtriptan (IMITREX) 50 MG tablet TAKE 1 TABLET AS NEEDED FOR MIGRAINE-MAY REPEAT IN 2 HR IF HEADACHE PERSISTS(MAX 2/DAY)   traZODone (DESYREL) 50 MG tablet Take 0.5-1 tablets (25-50 mg total) by mouth at bedtime as needed. for sleep   Allergies  Allergen Reactions   Morphine And Related Anaphylaxis   Testosterone     Dvt/PE   Morphine Nausea And Vomiting   Tdap [Tetanus-Diphth-Acell Pertussis] Hives and Rash   Recent Results (from the past 2160 hour(s))  CBC     Status: Abnormal   Collection Time: 06/06/21 10:29 AM  Result Value Ref Range   WBC 18.8 (H) 4.0 - 10.5 K/uL   RBC 5.24 4.22 - 5.81 MIL/uL   Hemoglobin 16.1 13.0 - 17.0 g/dL   HCT 49.1 39.0 - 52.0 %   MCV 93.7 80.0 - 100.0 fL   MCH  30.7 26.0 - 34.0 pg   MCHC 32.8 30.0 - 36.0 g/dL   RDW 12.8 11.5 - 15.5 %   Platelets 324 150 - 400 K/uL  nRBC 0.0 0.0 - 0.2 %    Comment: Performed at [facility], [address] [place], [place] [address]  Basic metabolic panel     Status: Abnormal   Collection Time: 06/06/21 10:29 AM  Result Value Ref Range   Sodium 140 135 - 145 mmol/L   Potassium 3.7 3.5 - 5.1 mmol/L   Chloride 104 98 - 111 mmol/L   CO2 27 22 - 32 mmol/L   Glucose, Bld 196 (H) 70 - 99 mg/dL    Comment: Glucose reference range applies only to samples taken after fasting for at least 8 hours.   BUN 24 (H) 6 - 20 mg/dL   Creatinine, Ser 1.19 0.61 - 1.24 mg/dL   Calcium 9.1 8.9 - 10.3 mg/dL   GFR, Estimated >60 >60 mL/min    Comment: (NOTE) Calculated using the CKD-EPI Creatinine Equation (2021)    Anion gap 9 5 - 15    Comment: Performed at [facility], [address], [place] [address]  Troponin I (High Sensitivity)     Status: None   Collection Time: 06/06/21 10:29 AM  Result Value Ref Range   Troponin I (High Sensitivity) 4 <18 ng/L    Comment: (NOTE) Elevated high sensitivity troponin I (hsTnI) values and significant  changes across serial measurements may suggest ACS but many other  chronic and acute conditions are known to elevate hsTnI results.  Refer to the "Links" section for chest pain algorithms and additional  guidance. Performed at [facility], [address] [place], [place] [address]    Objective  Body mass index is 26.42 kg/m. Wt Readings from Last 3 Encounters:  06/10/21 205 lb 12.8 oz (93.4 kg)  06/06/21 200 lb (90.7 kg)  12/04/20 204 lb (92.5 kg)   Temp Readings from Last 3 Encounters:  06/10/21 98 F (36.7 C) (Oral)  06/06/21 98.6 F (37 C) (Oral)  06/04/21 98.2 F (36.8 C)   BP Readings from Last 3 Encounters:  06/10/21 112/78  06/06/21 118/80  06/04/21 117/81   Pulse Readings from Last 3 Encounters:  06/10/21 98  06/06/21  76  06/04/21 95    Physical Exam Vitals and nursing note reviewed.  Constitutional:      Appearance: Normal appearance. He is well-developed and well-groomed. He is obese.  HENT:     Head: Normocephalic and atraumatic.  Eyes:     Conjunctiva/sclera: Conjunctivae normal.     Pupils: Pupils are equal, round, and reactive to light.  Cardiovascular:     Rate and Rhythm: Normal rate and regular rhythm.     Heart sounds: Normal heart sounds.  Pulmonary:     Effort: Pulmonary effort is normal. No respiratory distress.     Breath sounds: Normal breath sounds.  Abdominal:     Tenderness: There is no abdominal tenderness.  Musculoskeletal:     Lumbar back: Tenderness present. Negative right straight leg raise test and negative left straight leg raise test.  Skin:    General: Skin is warm and moist.  Neurological:     General: No focal deficit present.     Mental Status: He is alert and oriented to person, place, and time. Mental status is at baseline.     Sensory: Sensation is intact.     Motor: Motor function is intact.     Coordination: Coordination is intact.     Gait: Gait is intact. Gait normal.  Psychiatric:        Attention and Perception:  Attention and perception normal.        Mood and Affect: Mood and affect normal.        Speech: Speech normal.        Behavior: Behavior normal. Behavior is cooperative.        Thought Content: Thought content normal.        Cognition and Memory: Cognition and memory normal.        Judgment: Judgment normal.    Assessment  Plan  Hives vs PR- Plan: hydrOXYzine (ATARAX) 12.5 to 25 mg tid prn MG tablet, hydrocortisone 2.5 % cream, clobetasol cream (TEMOVATE) 0.05 % Pepcid, zyrtec, prednisone likely causing leukocytosis  If does not get better rec f/u Dr. Laurence Ferrari   Pruritus - Plan: hydrOXYzine (ATARAX) 25 MG tablet, hydrocortisone 2.5 % cream, clobetasol cream (TEMOVATE) 0.05 %    GERD  Food list  On protonix 40 mg qd   HM Flu shot utd   utd Tdap had 06/20/11 and 01/04/2018 [facility]  rec MMR vaccine  covid 3/3 pfizer  Completed hep B 3/3 vxs 12/2017 [facility]  -hep B immune Prediabetes 6.0 A1C  01/24/19 rec healthy diet and exercise  Dermatology saw [facility] 2019 left scalp bx negative   07/25/2018 stomach bx with EGD Dr. 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 No acute findings. No renal or ureteral stones. No obstructive uropathy. 2. Status post cholecystectomy. 3. Evidence of a broad-based disc protrusion at L4-L5 narrowing the right lateral recess. Consider further assessment with lumbar MRI with and without contrast if the patient has symptoms consistent with nerve impingement.     Xray eye 11/09/16  IMPRESSION: 1. No evidence of metallic foreign body within the orbits. 2. 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   -of note pt wants removed    US thyroid 12/14/09 normal   Stress test negative unc 01/05/18 and CXR 12/25/17 negative   Eye Dr. Marvel Plan    Provider: Dr. Olivia Mackie [name]-Internal Medicine

## 2021-06-10 NOTE — Patient Instructions (Addendum)
Pityriasis Rosea ? Calamine/caladryl   Can also try Sarna lotion   Try hydrocortisone to face and neck  Hydroxyzine up to 3x per day 1/2 to  1 pill daily if making too sleepy do not take with zyrtec  Clobetasol for body   Food Choices for Gastroesophageal Reflux Disease, Adult When you have gastroesophageal reflux disease (GERD), the foods you eat and your eating habits are very important. Choosing the right foods can help ease the discomfort of GERD. Consider working with a dietitian to help you make healthy food choices. What are tips for following this plan? Reading food labels Look for foods that are low in saturated fat. Foods that have less than 5% of daily value (DV) of fat and 0 g of trans fats may help with your symptoms. Cooking Cook foods using methods other than frying. This may include baking, steaming, grilling, or broiling. These are all methods that do not need a lot of fat for cooking. To add flavor, try to use herbs that are low in spice and acidity. Meal planning  Choose healthy foods that are low in fat, such as fruits, vegetables, whole grains, low-fat dairy products, lean meats, fish, and poultry. Eat frequent, small meals instead of three large meals each day. Eat your meals slowly, in a relaxed setting. Avoid bending over or lying down until 2-3 hours after eating. Limit high-fat foods such as fatty meats or fried foods. Limit your intake of fatty foods, such as oils, butter, and shortening. Avoid the following as told by your health care provider: Foods that cause symptoms. These may be different for different people. Keep a food diary to keep track of foods that cause symptoms. Alcohol. Drinking large amounts of liquid with meals. Eating meals during the 2-3 hours before bed. Lifestyle Maintain a healthy weight. Ask your health care provider what weight is healthy for you. If you need to lose weight, work with your health care provider to do so safely. Exercise  for at least 30 minutes on 5 or more days each week, or as told by your health care provider. Avoid wearing clothes that fit tightly around your waist and chest. Do not use any products that contain nicotine or tobacco. These products include cigarettes, chewing tobacco, and vaping devices, such as e-cigarettes. If you need help quitting, ask your health care provider. Sleep with the head of your bed raised. Use a wedge under the mattress or blocks under the bed frame to raise the head of the bed. Chew sugar-free gum after mealtimes. What foods should I eat? Eat a healthy, well-balanced diet of fruits, vegetables, whole grains, low-fat dairy products, lean meats, fish, and poultry. Each person is different. Foods that may trigger symptoms in one person may not trigger any symptoms in another person. Work with your health care provider to identify foods that are safe for you. The items listed above may not be a complete list of recommended foods and beverages. Contact a dietitian for more information. What foods should I avoid? Limiting some of these foods may help manage the symptoms of GERD. Everyone is different. Consult a dietitian or your health care provider to help you identify the exact foods to avoid, if any. Fruits Any fruits prepared with added fat. Any fruits that cause symptoms. For some people this may include citrus fruits, such as oranges, grapefruit, pineapple, and lemons. Vegetables Deep-fried vegetables. [place] fries. Any vegetables prepared with added fat. Any vegetables that cause symptoms. For some people, this  may include tomatoes and tomato products, chili peppers, onions and garlic, and horseradish. Grains Pastries or quick breads with added fat. Meats and other proteins High-fat meats, such as fatty beef or pork, hot dogs, ribs, ham, sausage, salami, and bacon. Fried meat or protein, including fried fish and fried chicken. Nuts and nut butters, in large  amounts. Dairy Whole milk and chocolate milk. Sour cream. Cream. Ice cream. Cream cheese. Milkshakes. Fats and oils Butter. Margarine. Shortening. Ghee. Beverages Coffee and tea, with or without caffeine. Carbonated beverages. Sodas. Energy drinks. Fruit juice made with acidic fruits, such as orange or grapefruit. Tomato juice. Alcoholic drinks. Sweets and desserts Chocolate and cocoa. Donuts. Seasonings and condiments Pepper. Peppermint and spearmint. Added salt. Any condiments, herbs, or seasonings that cause symptoms. For some people, this may include curry, hot sauce, or vinegar-based salad dressings. The items listed above may not be a complete list of foods and beverages to avoid. Contact a dietitian for more information. Questions to ask your health care provider Diet and lifestyle changes are usually the first steps that are taken to manage symptoms of GERD. If diet and lifestyle changes do not improve your symptoms, talk with your health care provider about taking medicines. Where to find more information International Foundation for Gastrointestinal Disorders: aboutgerd.org Summary When you have gastroesophageal reflux disease (GERD), food and lifestyle choices may be very helpful in easing the discomfort of GERD. Eat frequent, small meals instead of three large meals each day. Eat your meals slowly, in a relaxed setting. Avoid bending over or lying down until 2-3 hours after eating. Limit high-fat foods such as fatty meats or fried foods. This information is not intended to replace advice given to you by your health care provider. Make sure you discuss any questions you have with your health care provider. Document Revised: 11/18/2019 Document Reviewed: 11/18/2019 Elsevier Patient Education  [place] (urticaria) are itchy, red, swollen areas on the skin. Hives can appear on any part of the body. Hives often fade within 24 hours (acute hives). Sometimes, new  hives appear after old ones fade and the cycle can continue for several days or weeks (chronic hives). Hives do not spread from person to person (are not contagious). Hives come from the body's reaction to something a person is allergic to (allergen), something that causes irritation, or various other triggers. When a person is exposed to a trigger, his or her body releases a chemical (histamine) that causes redness, itching, and swelling. Hives can appear right after exposure to a trigger or hours later. What are the causes? This condition may be caused by: Allergies to foods or ingredients. Insect bites or stings. Exposure to pollen or pets. Spending time in sunlight, heat, or cold (exposure). Exercise. Stress. You can also get hives from other medical conditions and treatments, such as: Viruses, including the common cold. Bacterial infections, such as urinary tract infections and strep throat. Certain medicines. Contact with latex or chemicals. Allergy shots. Blood transfusions. Sometimes, the cause of this condition is not known (idiopathic hives). What increases the risk? You are more likely to develop this condition if you: Are a woman. Have food allergies, especially to citrus fruits, milk, eggs, peanuts, tree nuts, or shellfish. Are allergic to: Medicines. Latex. Insects. Animals. Pollen. What are the signs or symptoms? Common symptoms of this condition include raised, itchy, red or white bumps or patches on your skin. These areas may: Become large and swollen (welts). Change in shape and  location, quickly and repeatedly. Be separate hives or connect over a large area of skin. Sting or become painful. Turn white when pressed in the center (blanch). In severe cases, your hands, feet, and face may also become swollen. This may occur if hives develop deeper in your skin. How is this diagnosed? This condition may be diagnosed by your symptoms, medical history, and physical  exam. Your skin, urine, or blood may be tested to find out what is causing your hives and to rule out other health issues. Your health care provider may also remove a small sample of skin from the affected area and examine it under a microscope (biopsy). How is this treated? Treatment for this condition depends on the cause and severity of your symptoms. Your health care provider may recommend using cool, wet cloths (cool compresses) or taking cool showers to relieve itching. Treatment may include: Medicines that help: Relieve itching (antihistamines). Reduce swelling (corticosteroids). Treat infection (antibiotics). An injectable medicine (omalizumab). Your health care provider may prescribe this if you have chronic idiopathic hives and you continue to have symptoms even after treatment with antihistamines. Severe cases may require an emergency injection of adrenaline (epinephrine) to prevent a life-threatening allergic reaction (anaphylaxis). Follow these instructions at home: Medicines Take and apply over-the-counter and prescription medicines only as told by your health care provider. If you were prescribed an antibiotic medicine, take it as told by your health care provider. Do not stop using the antibiotic even if you start to feel better. Skin care Apply cool compresses to the affected areas. Do not scratch or rub your skin. General instructions Do not take hot showers or baths. This can make itching worse. Do not wear tight-fitting clothing. Use sunscreen and wear protective clothing when you are outside. Avoid any substances that cause your hives. Keep a journal to help track what causes your hives. Write down: What medicines you take. What you eat and drink. What products you use on your skin. Keep all follow-up visits as told by your health care provider. This is important. Contact a health care provider if: Your symptoms are not controlled with medicine. Your joints are  painful or swollen. Get help right away if: You have a fever. You have pain in your abdomen. Your tongue or lips are swollen. Your eyelids are swollen. Your chest or throat feels tight. You have trouble breathing or swallowing. These symptoms may represent a serious problem that is an emergency. Do not wait to see if the symptoms will go away. Get medical help right away. Call your local emergency services (911 in the [place] Do not drive yourself to the hospital. Summary Hives (urticaria) are itchy, red, swollen areas on your skin. Hives come from the body's reaction to something a person is allergic to (allergen), something that causes irritation, or various other triggers. Treatment for this condition depends on the cause and severity of your symptoms. Avoid any substances that cause your hives. Keep a journal to help track what causes your hives. Take and apply over-the-counter and prescription medicines only as told by your health care provider. Get help right away if your chest or throat feels tight or if you have trouble breathing or swallowing. This information is not intended to replace advice given to you by your health care provider. Make sure you discuss any questions you have with your health care provider. Document Revised: 06/28/2020 Document Reviewed: 06/28/2020 Elsevier Patient Education  [place].

## 2021-07-02 ENCOUNTER — Other Ambulatory Visit: Payer: Self-pay | Admitting: Internal Medicine

## 2021-07-02 DIAGNOSIS — L299 Pruritus, unspecified: Secondary | ICD-10-CM

## 2021-07-02 DIAGNOSIS — L509 Urticaria, unspecified: Secondary | ICD-10-CM

## 2021-07-05 ENCOUNTER — Ambulatory Visit: Payer: BC Managed Care – PPO | Admitting: Gastroenterology

## 2021-07-05 NOTE — Progress Notes (Unsigned)
Gastroenterology Consultation  Referring Provider:     [name], Olivia Mackie * Primary Care Physician:  [name], [name], MD Primary Gastroenterologist:  Dr. Allen Norris     Reason for Consultation:     GERD and abnormal liver enzymes        HPI:   Sergio Garcia is a 46 y.o. y/o male referred for consultation & management of GERD and abnormal liver enzymes by Dr. Terese Door, [name], MD. This patient comes in with a history of GERD and heartburn with the patient reporting it being so bad that he has had to go to the emergency department in the past for it.  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  The patient had 1 set of blood work that showed a increased AST at 54 back in February of last year but all the other liver enzymes dating back to 2018 were normal except for the bilirubin.  He also carries a diagnosis of irritable bowel syndrome.  The patient had previously been followed by Dr. Fuller Plan in [place] and then most recently by Dr. Gustavo Lah at [facility] with his last procedure being done at an outside facility in [place] and those records are not on care everywhere.  It appears to have been in March 2020 and was an upper endoscopy.  It appears that the patient had a normal EGD by Dr. Fuller Plan in 2014 and then had an EGD with mild gastritis by me in 2015. The pathology from his procedures in 2020 showed that he had mild gastritis in the antrum on biopsies by Dr. Gustavo Lah and a repeat colonoscopy was recommended to be performed in 5 years from that time.  The patient also reported that he had a mother with colon cancer at the age of 85 years old.  Past Medical History:  Diagnosis Date   Anxiety    COVID-19    07/04/20   Depression    Enteritis    Gastritis    GERD (gastroesophageal reflux disease)    Gilbert's syndrome     hyperbilirubinemia   IBS (irritable bowel syndrome)    Inguinal hernia    Insomnia    Pre-diabetes    Prostatitis 2008    Past Surgical History:  Procedure Laterality Date   BACK SURGERY     2 ruptured discs   CHOLECYSTECTOMY  01/31/2014   GB polyp, chronic cholecystitis    ESOPHAGOGASTRODUODENOSCOPY  09/2004   gastritis acute and duodenitis   ESOPHAGOGASTRODUODENOSCOPY  01-14-14   Dr Allen Norris   EXPLORATORY LAPAROTOMY  Jan 2015   Dr Burt Knack   LASIK     RIGHT/LEFT HEART CATH [place] N/A 10/18/2019   Procedure: RIGHT/LEFT HEART CATH AND CORONARY ANGIOGRAPHY;  Surgeon: Wellington Hampshire, MD;  Location: [facility];  Service: Cardiovascular;  Laterality: N/A;    Prior to Admission medications   Medication Sig Start Date End Date Taking? Authorizing Provider  albuterol (VENTOLIN HFA) 108 (90 Base) MCG/ACT inhaler Inhale 1-2 puffs into the lungs every 6 (six) hours as needed for wheezing or shortness of breath. 06/17/20   [name], [name], MD  cetirizine (ZYRTEC ALLERGY) 10 MG tablet Take 2 tablets (20 mg total) by mouth 2 (two) times daily for 10 days. 06/06/21 06/16/21  Rada Hay, MD  cetirizine (ZYRTEC) 10 MG tablet Take 10 mg  by mouth daily.    [provider]  clobetasol cream (TEMOVATE) 7.82 % Apply 1 application topically 2 (two) times daily. Body prn itching 06/10/21   [name], [name], MD  EPINEPHrine 0.3 mg/0.3 mL IJ SOAJ injection Inject into the muscle. 08/14/20   [provider]  famotidine (PEPCID) 20 MG tablet Take 2 tablets (40 mg total) by mouth 2 (two) times daily for 10 days. 06/06/21 06/16/21  Rada Hay, MD  FLUoxetine (PROZAC) 40 MG capsule Take 1 capsule (40 mg total) by mouth daily. 08/31/20   Ursula Alert, MD  fluticasone (FLONASE) 50 MCG/ACT nasal spray Place 2 sprays into both nostrils daily. 12/04/20   [name], [name], MD  hydrocortisone 2.5 % cream Apply topically 2 (two) times daily. Face  and neck if itching 06/10/21   [name], [name], MD  hydrOXYzine (ATARAX) 25 MG tablet TAKE 1/2 TO 1 TABLET (12.5-25 MG TOTAL) BY MOUTH 3 (THREE) TIMES DAILY AS NEEDED. 07/02/21   [name], [name], MD  ondansetron (ZOFRAN) 4 MG tablet TAKE 1 TABLET (4 MG TOTAL) BY MOUTH 2 (TWO) TIMES DAILY AS NEEDED FOR NAUSEA OR VOMITING. 12/22/20   [name], [name], MD  pantoprazole (PROTONIX) 40 MG tablet Take 1 tablet (40 mg total) by mouth daily. 30 min before food 11/24/20   [name], [name], MD  rivaroxaban (XARELTO) 20 MG TABS tablet Take 1 tablet (20 mg total) by mouth daily with supper. 12/04/20   [name], [name], MD  sodium chloride (OCEAN) 0.65 % SOLN nasal spray Place 1 spray into both nostrils as needed for congestion. 12/04/20   [name], [name], MD  SUMAtriptan (IMITREX) 50 MG tablet TAKE 1 TABLET AS NEEDED FOR MIGRAINE-MAY REPEAT IN 2 HR IF HEADACHE PERSISTS(MAX 2/DAY) 12/04/20   [name], [name], MD  traZODone (DESYREL) 50 MG tablet Take 0.5-1 tablets (25-50 mg total) by mouth at bedtime as needed. for sleep 11/19/20   Orlene Erm, MD    Family History  Problem Relation Age of Onset   Colon polyps Mother    Cancer Mother        sinus cavity, on XRT   Asthma Mother    Heart disease Father    Heart attack Father        x 2   Pancreatic cancer Maternal Grandfather    Colon cancer Maternal Grandfather    Diabetes Other        strong FH d both sides of family    Heart Problems Paternal Grandfather    Asthma Brother    Cancer Maternal Uncle    Stroke Cousin        age 24/44   Mental illness Neg Hx      Social History   Tobacco Use   Smoking status: Never   Smokeless tobacco: Never  Vaping Use   Vaping Use: Never used  Substance Use Topics   Alcohol use: Yes    Comment: occassional social   Drug use: No    Allergies as of 07/05/2021 - Review Complete 06/10/2021  Allergen Reaction Noted   Morphine and related Anaphylaxis  06/27/2013   Testosterone  06/26/2020   Morphine Nausea And Vomiting 07/15/2013   Tdap [tetanus-diphth-acell pertussis] Hives and Rash 06/21/2011    Review of Systems:    All systems reviewed and negative except where noted in HPI.   Physical Exam:  There were no vitals taken for this visit. No LMP for male patient. General:   Alert,  Well-developed, well-nourished, pleasant and cooperative in NAD Head:  Normocephalic and atraumatic. Eyes:  Sclera clear, no icterus.   Conjunctiva pink. Ears:  Normal auditory acuity. Neck:  Supple; no masses or thyromegaly. Lungs:  Respirations even and unlabored.  Clear throughout to auscultation.   No wheezes, crackles, or rhonchi. No acute distress. Heart:  Regular rate and rhythm; no murmurs, clicks, rubs, or gallops. Abdomen:  Normal bowel sounds.  No bruits.  Soft, non-tender and non-distended without masses, hepatosplenomegaly or hernias noted.  No guarding or rebound tenderness.  Negative Carnett sign.   Rectal:  Deferred.  Pulses:  Normal pulses noted. Extremities:  No clubbing or edema.  No cyanosis. Neurologic:  Alert and oriented x3;  grossly normal neurologically. Skin:  Intact without significant lesions or rashes.  No jaundice. Lymph Nodes:  No significant cervical adenopathy. Psych:  Alert and cooperative. Normal mood and affect.  Imaging Studies: DG Chest 2 View  Result Date: 06/06/2021 CLINICAL DATA:  Chest pain for 5 days. EXAM: CHEST - 2 VIEW COMPARISON:  07/04/2020. FINDINGS: Normal heart, mediastinum and hila. Clear lungs.  No pleural effusion or pneumothorax. Skeletal structures are unremarkable. IMPRESSION: No active cardiopulmonary disease. Electronically Signed   By: Lajean Manes M.D.   On: 06/06/2021 13:26    Assessment and Plan:   DELFINO FRIESEN is a 46 y.o. y/o male who had seen Dr. Fuller Plan and then myself in 2015 who then transferred his care to Dr. Gustavo Lah up until 2020 after being referred there by his PCP.  The  patient is now here to see me to transfer his care back to me now that Dr. Gustavo Lah has retired with a report of heartburn and regurgitation.    [name], MD. Marval Regal    Note: This dictation was prepared with Dragon dictation along with smaller phrase technology. Any transcriptional errors that result from this process are unintentional.

## 2021-07-22 ENCOUNTER — Ambulatory Visit: Payer: BC Managed Care – PPO | Admitting: Gastroenterology

## 2021-07-26 ENCOUNTER — Encounter: Payer: Self-pay | Admitting: Internal Medicine

## 2021-07-27 NOTE — Telephone Encounter (Signed)
Does Patient need an appointment to discuss worsening heartburn and reflux?  ?

## 2021-07-29 ENCOUNTER — Ambulatory Visit (INDEPENDENT_AMBULATORY_CARE_PROVIDER_SITE_OTHER): Payer: BC Managed Care – PPO | Admitting: Gastroenterology

## 2021-07-29 ENCOUNTER — Other Ambulatory Visit: Payer: Self-pay

## 2021-07-29 ENCOUNTER — Encounter: Payer: Self-pay | Admitting: Gastroenterology

## 2021-07-29 VITALS — BP 113/78 | HR 94 | Temp 98.6°F | Ht 74.0 in | Wt 207.0 lb

## 2021-07-29 DIAGNOSIS — K219 Gastro-esophageal reflux disease without esophagitis: Secondary | ICD-10-CM | POA: Diagnosis not present

## 2021-07-29 DIAGNOSIS — K580 Irritable bowel syndrome with diarrhea: Secondary | ICD-10-CM | POA: Diagnosis not present

## 2021-07-29 MED ORDER — ESOMEPRAZOLE MAGNESIUM 40 MG PO CPDR
40.0000 mg | DELAYED_RELEASE_CAPSULE | Freq: Two times a day (BID) | ORAL | 5 refills | Status: DC
Start: 2021-07-29 — End: 2022-06-16

## 2021-07-29 MED ORDER — DICYCLOMINE HCL 20 MG PO TABS: 20.0000 mg | ORAL_TABLET | Freq: Three times a day (TID) | ORAL | 5 refills | Status: DC

## 2021-07-29 NOTE — Progress Notes (Signed)
? ? ?Gastroenterology Consultation ? ?Referring Provider:     [name], Olivia Mackie * ?Primary Care Physician:  [name], [name], MD ?Primary Gastroenterologist:  Dr. Allen Norris     ?Reason for Consultation:     Heartburn ?      ? HPI:   ?Sergio Garcia is a 46 y.o. y/o male referred for consultation & management of Heartburn by Dr. Terese Door, [name], MD.  This patient comes to see me after being seen by Dr. Olevia Perches in [place] back in 2006 followed by Dr. Vira Agar at [facility] also in 2006.  The patient then followed up with Dr. Fuller Plan in 2006 to 2015 while also being seen by Dr. Gustavo Lah in 2006 and 2007. The patient was then seen again by Dr. Gustavo Lah in 2020 with an upper endoscopy and colonoscopy done at that time. Those procedures were done at an outside endoscopy center and the records are not accessible to me at this time. It also appears that the patient has had previous upper endoscopies by various physicians in 2006, 2014 and 2015. ?He reports that he had chest pain last month and reports acid reflux every day. He had been on protonix  '40mg'$  every day He has to force himself to eat. GERD is bad at night and during the day. He stopped soft drinks and fatty food. He has a history of IBS and had watery diarrhea. He has cramps with his IBS at least 3-4 times a week. ? ?Past Medical History:  ?Diagnosis Date  ? Anxiety   ? COVID-19   ? 07/04/20  ? Depression   ? Enteritis   ? Gastritis   ? GERD (gastroesophageal reflux disease)   ? Gilbert's syndrome   ? hyperbilirubinemia  ? IBS (irritable bowel syndrome)   ? Inguinal hernia   ? Insomnia   ? Pre-diabetes   ? Prostatitis 2008  ? ? ?Past Surgical History:  ?Procedure Laterality Date  ? BACK SURGERY    ? 2 ruptured discs  ? CHOLECYSTECTOMY  01/31/2014  ? GB polyp, chronic cholecystitis   ? ESOPHAGOGASTRODUODENOSCOPY  09/2004  ? gastritis acute and duodenitis  ? ESOPHAGOGASTRODUODENOSCOPY  01-14-14  ? Dr Allen Norris  ? EXPLORATORY LAPAROTOMY  Jan 2015  ? Dr Burt Knack  ?  LASIK    ? RIGHT/LEFT HEART CATH AND CORONARY ANGIOGRAPHY N/A 10/18/2019  ? Procedure: RIGHT/LEFT HEART CATH AND CORONARY ANGIOGRAPHY;  Surgeon: Wellington Hampshire, MD;  Location: [facility];  Service: Cardiovascular;  Laterality: N/A;  ? ? ?Prior to Admission medications   ?Medication Sig Start Date End Date Taking? Authorizing Provider  ?albuterol (VENTOLIN HFA) 108 (90 Base) MCG/ACT inhaler Inhale 1-2 puffs into the lungs every 6 (six) hours as needed for wheezing or shortness of breath. 06/17/20   [name], [name], MD  ?cetirizine (ZYRTEC ALLERGY) 10 MG tablet Take 2 tablets (20 mg total) by mouth 2 (two) times daily for 10 days. 06/06/21 06/16/21  Rada Hay, MD  ?cetirizine (ZYRTEC) 10 MG tablet Take 10 mg by mouth daily.    [provider]  ?clobetasol cream (TEMOVATE) 1.61 % Apply 1 application topically 2 (two) times daily. Body prn itching 06/10/21   [name], [name], MD  ?EPINEPHrine 0.3 mg/0.3 mL IJ SOAJ injection Inject into the muscle. 08/14/20   [provider]  ?famotidine (PEPCID) 20 MG tablet Take 2 tablets (40 mg total) by mouth 2 (two) times daily for 10 days. 06/06/21 06/16/21  Rada Hay, MD  ?FLUoxetine [facility])  40 MG capsule Take 1 capsule (40 mg total) by mouth daily. 08/31/20   Ursula Alert, MD  ?fluticasone (FLONASE) 50 MCG/ACT nasal spray Place 2 sprays into both nostrils daily. 12/04/20   [name], [name], MD  ?hydrocortisone 2.5 % cream Apply topically 2 (two) times daily. Face and neck if itching 06/10/21   [name], [name], MD  ?hydrOXYzine (ATARAX) 25 MG tablet TAKE 1/2 TO 1 TABLET (12.5-25 MG TOTAL) BY MOUTH 3 (THREE) TIMES DAILY AS NEEDED. 07/02/21   [name], [name], MD  ?ondansetron (ZOFRAN) 4 MG tablet TAKE 1 TABLET (4 MG TOTAL) BY MOUTH 2 (TWO) TIMES DAILY AS NEEDED FOR NAUSEA OR VOMITING. 12/22/20   [name], [name], MD  ?pantoprazole (PROTONIX) 40 MG tablet Take 1 tablet (40 mg total) by mouth  daily. 30 min before food 11/24/20   [name], [name], MD  ?rivaroxaban (XARELTO) 20 MG TABS tablet Take 1 tablet (20 mg total) by mouth daily with supper. 12/04/20   [name], [name], MD  ?sodium chloride (OCEAN) 0.65 % SOLN nasal spray Place 1 spray into both nostrils as needed for congestion. 12/04/20   [name], [name], MD  ?SUMAtriptan (IMITREX) 50 MG tablet TAKE 1 TABLET AS NEEDED FOR MIGRAINE-MAY REPEAT IN 2 HR IF HEADACHE PERSISTS(MAX 2/DAY) 12/04/20   [name], [name], MD  ?traZODone (DESYREL) 50 MG tablet Take 0.5-1 tablets (25-50 mg total) by mouth at bedtime as needed. for sleep 11/19/20   Orlene Erm, MD  ? ? ?Family History  ?Problem Relation Age of Onset  ? Colon polyps Mother   ? Cancer Mother   ?     sinus cavity, on XRT  ? Asthma Mother   ? Heart disease Father   ? Heart attack Father   ?     x 2  ? Pancreatic cancer Maternal Grandfather   ? Colon cancer Maternal Grandfather   ? Diabetes Other   ?     strong FH d both sides of family   ? Heart Problems Paternal Grandfather   ? Asthma Brother   ? Cancer Maternal Uncle   ? Stroke Cousin   ?     age 44/44  ? Mental illness Neg Hx   ?  ? ?Social History  ? ?Tobacco Use  ? Smoking status: Never  ? Smokeless tobacco: Never  ?Vaping Use  ? Vaping Use: Never used  ?Substance Use Topics  ? Alcohol use: Yes  ?  Comment: occassional social  ? Drug use: No  ? ? ?Allergies as of 07/29/2021 - Review Complete 06/10/2021  ?Allergen Reaction Noted  ? Morphine and related Anaphylaxis 06/27/2013  ? Testosterone  06/26/2020  ? Morphine Nausea And Vomiting 07/15/2013  ? Tdap [tetanus-diphth-acell pertussis] Hives and Rash 06/21/2011  ? ? ?Review of Systems:    ?All systems reviewed and negative except where noted in HPI. ? ? Physical Exam:  ?There were no vitals taken for this visit. ?No LMP for male patient. ?General:   Alert,  Well-developed, well-nourished, pleasant and cooperative in NAD ?Head:  Normocephalic and atraumatic. ?Eyes:   Sclera clear, no icterus.   Conjunctiva pink. ?Ears:  Normal auditory acuity. ?Neck:  Supple; no masses or thyromegaly. ?Lungs:  Respirations even and unlabored.  Clear throughout to auscultation.   No wheezes, crackles, or rhonchi. No acute distress. ?Heart:  Regular rate and rhythm; no murmurs, clicks, rubs, or gallops. ?Abdomen:  Normal bowel sounds.  No bruits.  Soft, non-tender and non-distended without masses,  hepatosplenomegaly or hernias noted.  No guarding or rebound tenderness.  Negative Carnett sign.   ?Rectal:  Deferred.  ?Pulses:  Normal pulses noted. ?Extremities:  No clubbing or edema.  No cyanosis. ?Neurologic:  Alert and oriented x3;  grossly normal neurologically. ?Skin:  Intact without significant lesions or rashes.  No jaundice. ?Lymph Nodes:  No significant cervical adenopathy. ?Psych:  Alert and cooperative. Normal mood and affect. ? ?Imaging Studies: ?No results found. ? ?Assessment and Plan:  ? ?Sergio Garcia is a 46 y.o. y/o male who comes in today with a history of IBS and GERD not be helped with his present PPI.  The patient will be started on dicyclomine 20 mg 3 times a day.  The patient will also be started on Nexium for his acid reflux. The patient also has some dysphagia and therefore will be also set up for a upper endoscopy.  The patient has been explained the plan and agrees with it. ? ? ?[name], MD. Mar[facility] Regal ? ? ? Note: This dictation was prepared with Dragon dictation along with smaller phrase technology. Any transcriptional errors that result from this process are unintentional.   ?

## 2021-08-23 ENCOUNTER — Other Ambulatory Visit: Payer: Self-pay | Admitting: Gastroenterology

## 2021-08-23 DIAGNOSIS — R1310 Dysphagia, unspecified: Secondary | ICD-10-CM

## 2021-09-06 ENCOUNTER — Ambulatory Visit: Payer: BC Managed Care – PPO | Admitting: Gastroenterology

## 2021-09-16 ENCOUNTER — Encounter: Payer: Self-pay | Admitting: Gastroenterology

## 2021-09-16 ENCOUNTER — Other Ambulatory Visit: Payer: Self-pay

## 2021-09-16 ENCOUNTER — Encounter
Admission: RE | Disposition: A | Discharge status: Home / Self Care | Payer: Self-pay | Source: Home / Self Care | Attending: Gastroenterology

## 2021-09-16 ENCOUNTER — Ambulatory Visit: Payer: BC Managed Care – PPO | Admitting: Anesthesiology

## 2021-09-16 ENCOUNTER — Ambulatory Visit
Admission: RE | Admit: 2021-09-16 | Discharge: 2021-09-16 | Disposition: A | Discharge status: Home / Self Care | Payer: BC Managed Care – PPO | Attending: Gastroenterology | Admitting: Gastroenterology

## 2021-09-16 DIAGNOSIS — R1310 Dysphagia, unspecified: Secondary | ICD-10-CM

## 2021-09-16 DIAGNOSIS — Z8616 Personal history of COVID-19: Secondary | ICD-10-CM | POA: Insufficient documentation

## 2021-09-16 DIAGNOSIS — K449 Diaphragmatic hernia without obstruction or gangrene: Secondary | ICD-10-CM | POA: Insufficient documentation

## 2021-09-16 DIAGNOSIS — K219 Gastro-esophageal reflux disease without esophagitis: Secondary | ICD-10-CM | POA: Diagnosis not present

## 2021-09-16 HISTORY — PX: ESOPHAGOGASTRODUODENOSCOPY: SHX5428

## 2021-09-16 LAB — GLUCOSE, CAPILLARY: Glucose-Capillary: 118 mg/dL — ABNORMAL HIGH (ref 70–99)

## 2021-09-16 SURGERY — EGD (ESOPHAGOGASTRODUODENOSCOPY)
Anesthesia: General

## 2021-09-16 MED ORDER — PROPOFOL 10 MG/ML IV BOLUS
INTRAVENOUS | Status: DC | PRN
Start: 2021-09-16 — End: 2021-09-16
  Administered 2021-09-16: 50 mg via INTRAVENOUS

## 2021-09-16 MED ORDER — LIDOCAINE HCL (CARDIAC) PF 100 MG/5ML IV SOSY
PREFILLED_SYRINGE | INTRAVENOUS | Status: DC | PRN
  Administered 2021-09-16: 100 mg via INTRAVENOUS

## 2021-09-16 MED ORDER — DEXMEDETOMIDINE HCL IN NACL 400 MCG/100ML IV SOLN
INTRAVENOUS | Status: DC | PRN
Start: 2021-09-16 — End: 2021-09-16
  Administered 2021-09-16 (×4): 12 ug via INTRAVENOUS

## 2021-09-16 MED ORDER — SODIUM CHLORIDE 0.9 % IV SOLN: INTRAVENOUS | Status: DC

## 2021-09-16 MED ORDER — MIDAZOLAM HCL 2 MG/2ML IJ SOLN
INTRAMUSCULAR | Status: AC
  Filled 2021-09-16: qty 2

## 2021-09-16 MED ORDER — PROPOFOL 500 MG/50ML IV EMUL
INTRAVENOUS | Status: AC
  Filled 2021-09-16: qty 50

## 2021-09-16 MED ORDER — PROPOFOL 500 MG/50ML IV EMUL
INTRAVENOUS | Status: DC | PRN
  Administered 2021-09-16: 100 ug/kg/min via INTRAVENOUS

## 2021-09-16 MED ORDER — MIDAZOLAM HCL 2 MG/2ML IJ SOLN
INTRAMUSCULAR | Status: DC | PRN
  Administered 2021-09-16: 2 mg via INTRAVENOUS

## 2021-09-16 NOTE — Anesthesia Postprocedure Evaluation (Signed)
Anesthesia Post Note ? ?Patient: Sergio Garcia ? ?Procedure(s) Performed: ESOPHAGOGASTRODUODENOSCOPY (EGD) ? ?Patient location during evaluation: PACU ?Anesthesia Type: General ?Level of consciousness: awake, awake and alert and oriented ?Pain management: satisfactory to patient ?Vital Signs Assessment: post-procedure vital signs reviewed and stable ?Respiratory status: spontaneous breathing ?Cardiovascular status: stable ?Anesthetic complications: no ? ? ?No notable events documented. ? ? ?Last Vitals:  ?Vitals:  ? 09/16/21 0800 09/16/21 0810  ?BP: 95/72 101/77  ?Pulse: 65 (!) 59  ?Resp: (!) 21 10  ?Temp: (!) 36.2 ?C   ?SpO2: 95% 96%  ?  ?Last Pain:  ?Vitals:  ? 09/16/21 0810  ?TempSrc:   ?PainSc: 0-No pain  ? ? ?  ?  ?  ?  ?  ?  ? ?VAN STAVEREN,Melva Faux ? ? ? ? ?

## 2021-09-16 NOTE — Anesthesia Preprocedure Evaluation (Signed)
Anesthesia Evaluation  ?Patient identified by MRN, date of birth, ID band ?Patient awake ? ? ? ?Reviewed: ?Allergy & Precautions, NPO status , Patient's Chart, lab work & pertinent test results ? ?Airway ?Mallampati: II ? ?TM Distance: >3 FB ?Neck ROM: full ? ? ? Dental ? ?(+) Teeth Intact ?  ?Pulmonary ?neg pulmonary ROS,  ?  ?Pulmonary exam normal ?breath sounds clear to auscultation ? ? ? ? ? ? Cardiovascular ?Exercise Tolerance: Good ?negative cardio ROS ?Normal cardiovascular exam ?Rhythm:Regular Rate:Normal ? ? ?  ?Neuro/Psych ?Anxiety Depression negative neurological ROS ? negative psych ROS  ? GI/Hepatic ?negative GI ROS, Neg liver ROS, GERD  Medicated,  ?Endo/Other  ?negative endocrine ROS ? Renal/GU ?negative Renal ROS  ?negative genitourinary ?  ?Musculoskeletal ?negative musculoskeletal ROS ?(+)  ? Abdominal ?Normal abdominal exam  (+)   ?Peds ?negative pediatric ROS ?(+)  Hematology ?negative hematology ROS ?(+)   ?Anesthesia Other Findings ?Past Medical History: ?No date: Anxiety ?No date: COVID-19 ?    Comment:  07/04/20 ?No date: Depression ?No date: Enteritis ?No date: Gastritis ?No date: GERD (gastroesophageal reflux disease) ?No date: Gilbert's syndrome ?    Comment:  hyperbilirubinemia ?No date: IBS (irritable bowel syndrome) ?No date: Inguinal hernia ?No date: Insomnia ?No date: Pre-diabetes ?2008: Prostatitis ? ?Past Surgical History: ?No date: BACK SURGERY ?    Comment:  2 ruptured discs ?01/31/2014: CHOLECYSTECTOMY ?    Comment:  GB polyp, chronic cholecystitis  ?09/2004: ESOPHAGOGASTRODUODENOSCOPY ?    Comment:  gastritis acute and duodenitis ?01-14-14: ESOPHAGOGASTRODUODENOSCOPY ?    Comment:  Dr Allen Norris ?Jan 2015: EXPLORATORY LAPAROTOMY ?    Comment:  Dr Burt Knack ?No date: LASIK ?10/18/2019: RIGHT/LEFT HEART CATH AND CORONARY ANGIOGRAPHY; N/A ?    Comment:  Procedure: RIGHT/LEFT HEART CATH AND CORONARY  ?             ANGIOGRAPHY;  Surgeon: Wellington Hampshire, MD;   Location:  ?             [facility];  Service: Cardiovascular;   ?             Laterality: N/A; ? ?BMI   ? Body Mass Index: 25.68 kg/m?  ?  ? ? Reproductive/Obstetrics ?negative OB ROS ? ?  ? ? ? ? ? ? ? ? ? ? ? ? ? ?  ?  ? ? ? ? ? ? ? ? ?Anesthesia Physical ?Anesthesia Plan ? ?ASA: 2 ? ?Anesthesia Plan: General  ? ?Post-op Pain Management:   ? ?Induction: Intravenous ? ?PONV Risk Score and Plan: Propofol infusion and TIVA ? ?Airway Management Planned: Natural Airway and Nasal Cannula ? ?Additional Equipment:  ? ?Intra-op Plan:  ? ?Post-operative Plan:  ? ?Informed Consent: I have reviewed the patients History and Physical, chart, labs and discussed the procedure including the risks, benefits and alternatives for the proposed anesthesia with the patient or authorized representative who has indicated his/her understanding and acceptance.  ? ? ? ?Dental Advisory Given ? ?Plan Discussed with: CRNA and Surgeon ? ?Anesthesia Plan Comments:   ? ? ? ? ? ? ?Anesthesia Quick Evaluation ? ?

## 2021-09-16 NOTE — Transfer of Care (Signed)
Immediate Anesthesia Transfer of Care Note ? ?Patient: Sergio Garcia ? ?Procedure(s) Performed: ESOPHAGOGASTRODUODENOSCOPY (EGD) ? ?Patient Location: PACU ? ?Anesthesia Type:General ? ?Level of Consciousness: sedated ? ?Airway & Oxygen Therapy: Patient Spontanous Breathing ? ?Post-op Assessment: Report given to RN and Post -op Vital signs reviewed and stable ? ?Post vital signs: Reviewed and stable ? ?Last Vitals:  ?Vitals Value Taken Time  ?BP 95/72 09/16/21 0800  ?Temp    ?Pulse 67 09/16/21 0802  ?Resp 20 09/16/21 0802  ?SpO2 91 % 09/16/21 0802  ?Vitals shown include unvalidated device data. ? ?Last Pain:  ?Vitals:  ? 09/16/21 0659  ?TempSrc: Temporal  ?PainSc: 0-No pain  ?   ? ?  ? ?Complications: No notable events documented. ?

## 2021-09-16 NOTE — H&P (Signed)
? ?Sergio Lame, MD [facility] ?[address] ?[place], [place] [address] ?Phone:[phone] ?Fax : [phone] ? ?Primary Care Physician:  [name], [name], MD ?Primary Gastroenterologist:  Dr. Allen Norris ? ?Pre-Procedure History & Physical: ?HPI:  Sergio Garcia is a 46 y.o. male is here for an endoscopy. ?  ?Past Medical History:  ?Diagnosis Date  ? Anxiety   ? COVID-19   ? 07/04/20  ? Depression   ? Enteritis   ? Gastritis   ? GERD (gastroesophageal reflux disease)   ? Gilbert's syndrome   ? hyperbilirubinemia  ? IBS (irritable bowel syndrome)   ? Inguinal hernia   ? Insomnia   ? Pre-diabetes   ? Prostatitis 2008  ? ? ?Past Surgical History:  ?Procedure Laterality Date  ? BACK SURGERY    ? 2 ruptured discs  ? CHOLECYSTECTOMY  01/31/2014  ? GB polyp, chronic cholecystitis   ? ESOPHAGOGASTRODUODENOSCOPY  09/2004  ? gastritis acute and duodenitis  ? ESOPHAGOGASTRODUODENOSCOPY  01-14-14  ? Dr Allen Norris  ? EXPLORATORY LAPAROTOMY  Jan 2015  ? Dr Burt Knack  ? LASIK    ? RIGHT/LEFT HEART CATH AND CORONARY ANGIOGRAPHY N/A 10/18/2019  ? Procedure: RIGHT/LEFT HEART CATH AND CORONARY ANGIOGRAPHY;  Surgeon: Wellington Hampshire, MD;  Location: [facility];  Service: Cardiovascular;  Laterality: N/A;  ? ? ?Prior to Admission medications   ?Medication Sig Start Date End Date Taking? Authorizing Provider  ?dicyclomine (BENTYL) 20 MG tablet Take 1 tablet (20 mg total) by mouth 3 (three) times daily before meals. 07/29/21  Yes Sergio Lame, MD  ?esomeprazole (NEXIUM) 40 MG capsule Take 1 capsule (40 mg total) by mouth 2 (two) times daily before a meal. 07/29/21  Yes Sergio Lame, MD  ?pantoprazole (PROTONIX) 40 MG tablet Take 1 tablet (40 mg total) by mouth daily. 30 min before food 11/24/20  Yes [name], [name], MD  ?EPINEPHrine 0.3 mg/0.3 mL IJ SOAJ injection Inject into the muscle. ?Patient not taking: Reported on 09/16/2021 08/14/20   [provider]  ?famotidine (PEPCID) 20 MG tablet Take 2 tablets (40 mg total) by  mouth 2 (two) times daily for 10 days. 06/06/21 06/16/21  Rada Hay, MD  ?FLUoxetine (PROZAC) 40 MG capsule Take 1 capsule (40 mg total) by mouth daily. ?Patient not taking: Reported on 09/16/2021 08/31/20   Ursula Alert, MD  ?ondansetron (ZOFRAN) 4 MG tablet TAKE 1 TABLET (4 MG TOTAL) BY MOUTH 2 (TWO) TIMES DAILY AS NEEDED FOR NAUSEA OR VOMITING. ?Patient not taking: Reported on 09/16/2021 12/22/20   [name], [name], MD  ?traZODone (DESYREL) 50 MG tablet Take 0.5-1 tablets (25-50 mg total) by mouth at bedtime as needed. for sleep ?Patient not taking: Reported on 09/16/2021 11/19/20   Orlene Erm, MD  ? ? ?Allergies as of 08/23/2021 - Review Complete 07/29/2021  ?Allergen Reaction Noted  ? Morphine and related Anaphylaxis 06/27/2013  ? Testosterone  06/26/2020  ? Morphine Nausea And Vomiting 07/15/2013  ? Tdap [tetanus-diphth-acell pertussis] Hives and Rash 06/21/2011  ? ? ?Family History  ?Problem Relation Age of Onset  ? Colon polyps Mother   ? Cancer Mother   ?     sinus cavity, on XRT  ? Asthma Mother   ? Heart disease Father   ? Heart attack Father   ?     x 2  ? Pancreatic cancer Maternal Grandfather   ? Colon cancer Maternal Grandfather   ? Diabetes Other   ?     strong  FH d both sides of family   ? Heart Problems Paternal Grandfather   ? Asthma Brother   ? Cancer Maternal Uncle   ? Stroke Cousin   ?     age 13/44  ? Mental illness Neg Hx   ? ? ?Social History  ? ?Socioeconomic History  ? Marital status: Divorced  ?  Spouse name: Not on file  ? Number of children: 2  ? Years of education: Not on file  ? Highest education level: High school graduate  ?Occupational History  ? Occupation: Dealer  ?  Employer: [employer]  ?Tobacco Use  ? Smoking status: Never  ? Smokeless tobacco: Never  ?Vaping Use  ? Vaping Use: Never used  ?Substance and Sexual Activity  ? Alcohol use: Yes  ?  Comment: occassional social  ? Drug use: No  ? Sexual activity: Yes  ?Other Topics Concern  ? Not on  file  ?Social History Narrative  ? Married.  ? Lives in [place]   ? [employer]er   ? 2 children.  ? Enjoys watching racing, Dealer, deer hunting.   ?   ?   ?   ? ?Social Determinants of Health  ? ?Financial Resource Strain: Not on file  ?Food Insecurity: Not on file  ?Transportation Needs: Not on file  ?Physical Activity: Not on file  ?Stress: Not on file  ?Social Connections: Not on file  ?Intimate Partner Violence: Not on file  ? ? ?Review of Systems: ?See HPI, otherwise negative ROS ? ?Physical Exam: ?BP (!) 126/102   Pulse 73   Temp (!) 96 ?F (35.6 ?C) (Temporal)   Resp 16   Ht '6\' 2"'$  (1.88 m)   Wt 90.7 kg   SpO2 99%   BMI 25.68 kg/m?  ?General:   Alert,  pleasant and cooperative in NAD ?Head:  Normocephalic and atraumatic. ?Neck:  Supple; no masses or thyromegaly. ?Lungs:  Clear throughout to auscultation.    ?Heart:  Regular rate and rhythm. ?Abdomen:  Soft, nontender and nondistended. Normal bowel sounds, without guarding, and without rebound.   ?Neurologic:  Alert and  oriented x4;  grossly normal neurologically. ? ?Impression/Plan: ?Sergio Garcia is here for an endoscopy to be performed for dysphagia ? ?Risks, benefits, limitations, and alternatives regarding esophagogastroduodenoscopy have been reviewed with the patient.  Questions have been answered.  All parties agreeable. ? ? ?Sergio Lame, MD  09/16/2021, 7:35 AM ?

## 2021-09-16 NOTE — Op Note (Signed)
[facility] ?Gastroenterology ?Patient Name: Sergio Garcia ?Procedure Date: 09/16/2021 7:37 AM ?MRN: [id number] ?Account #: [id number] ?Date of Birth: 06-Dec-1975 ?Admit Type: Outpatient ?Age: 46 ?Room: [facility] ENDO ROOM 4 ?Gender: Male ?Note Status: Finalized ?Instrument Name: Upper-Endoscope [id number] ?Procedure:             Upper GI endoscopy ?Indications:           Dysphagia ?Providers:             [name] MD, MD ?Referring MD:          [name] [name] MD, MD (Referring MD) ?Medicines:             Propofol per Anesthesia ?Complications:         No immediate complications. ?Procedure:             Pre-Anesthesia Assessment: ?                       - Prior to the procedure, a History and Physical was  ?                       performed, and patient medications and allergies were  ?                       reviewed. The patient's tolerance of previous  ?                       anesthesia was also reviewed. The risks and benefits  ?                       of the procedure and the sedation options and risks  ?                       were discussed with the patient. All questions were  ?                       answered, and informed consent was obtained. Prior  ?                       Anticoagulants: The patient has taken no previous  ?                       anticoagulant or antiplatelet agents. ASA Grade  ?                       Assessment: II - A patient with mild systemic disease.  ?                       After reviewing the risks and benefits, the patient  ?                       was deemed in satisfactory condition to undergo the  ?                       procedure. ?                       After obtaining informed consent, the endoscope was  ?  passed under direct vision. Throughout the procedure,  ?                       the patient's blood pressure, pulse, and oxygen  ?                       saturations were monitored continuously. The Endoscope  ?                       was  introduced through the mouth, and advanced to the  ?                       second part of duodenum. The upper GI endoscopy was  ?                       accomplished without difficulty. The patient tolerated  ?                       the procedure well. ?Findings: ?     A small hiatal hernia was present. ?     The stomach was normal. ?     The examined duodenum was normal. ?     Two biopsies were obtained with cold forceps for histology in the middle  ?     third of the esophagus. ?     A TTS dilator was passed through the scope. Dilation with a 15-16.5-18  ?     mm balloon dilator was performed to 18 mm in the entire esophagus. ?Impression:            - Small hiatal hernia. ?                       - Normal stomach. ?                       - Normal examined duodenum. ?                       - Biopsy performed in the middle third of the  ?                       esophagus. ?                       - Dilation performed in the entire esophagus. ?Recommendation:        - Discharge patient to home. ?                       - Resume previous diet. ?                       - Continue present medications. ?                       - Await pathology results. ?Procedure Code(s):     --- Professional --- ?                       [phone], Esophagogastroduodenoscopy, flexible,  ?                       transoral; with transendoscopic balloon dilation of  ?  esophagus (less than 30 mm diameter) ?                       [id number], 59, Esophagogastroduodenoscopy, flexible,  ?                       transoral; with biopsy, single or multiple ?Diagnosis Code(s):     --- Professional --- ?                       R13.10, Dysphagia, unspecified ?CPT copyright 2019 American Medical Association. All rights reserved. ?The codes documented in this report are preliminary and upon coder review may  ?be revised to meet current compliance requirements. ?[name] MD, MD ?09/16/2021 7:56:10 AM ?This report has been signed  electronically. ?Number of Addenda: 0 ?Note Initiated On: 09/16/2021 7:37 AM ?Estimated Blood Loss:  Estimated blood loss: none. ?     [facility] ?

## 2021-09-17 ENCOUNTER — Encounter: Payer: Self-pay | Admitting: Gastroenterology

## 2021-09-17 LAB — SURGICAL PATHOLOGY

## 2021-09-20 ENCOUNTER — Encounter: Payer: Self-pay | Admitting: Gastroenterology

## 2021-09-23 ENCOUNTER — Telehealth: Payer: Self-pay | Admitting: Internal Medicine

## 2021-09-23 NOTE — Telephone Encounter (Signed)
S/w pt about his ongoing sx's and informed him that Dr.Tracy has stated that he needs to be seen to at the ED or call 911 to be transported for further evaluation, since they would be able to do stat CT's and lab work. Pt agreed with plan and will proceed to ED.  ?

## 2021-09-23 NOTE — Telephone Encounter (Signed)
Pt called in requesting for referral for CT Scan... Pt stated that last year Dr. Olivia Mackie sent him to do a CT Scan of his lungs and the results came back with blood clots... Pt stated that he is experiencing shortness of breathe.. Pt stated he is having some of the same symptoms from before when Dr. Olivia Mackie sent him last year for CT Scan... Pt requesting callback...  ?

## 2021-12-08 ENCOUNTER — Ambulatory Visit: Payer: BC Managed Care – PPO | Admitting: Internal Medicine

## 2021-12-14 ENCOUNTER — Ambulatory Visit: Payer: BC Managed Care – PPO | Admitting: Internal Medicine

## 2021-12-15 ENCOUNTER — Ambulatory Visit: Payer: BC Managed Care – PPO | Admitting: Internal Medicine

## 2021-12-15 ENCOUNTER — Encounter: Payer: Self-pay | Admitting: Internal Medicine

## 2021-12-15 VITALS — BP 120/80 | HR 61 | Temp 97.8°F | Ht 74.0 in | Wt 209.8 lb

## 2021-12-15 DIAGNOSIS — Z1329 Encounter for screening for other suspected endocrine disorder: Secondary | ICD-10-CM | POA: Diagnosis not present

## 2021-12-15 DIAGNOSIS — R1084 Generalized abdominal pain: Secondary | ICD-10-CM

## 2021-12-15 DIAGNOSIS — R6881 Early satiety: Secondary | ICD-10-CM | POA: Diagnosis not present

## 2021-12-15 DIAGNOSIS — D72829 Elevated white blood cell count, unspecified: Secondary | ICD-10-CM | POA: Diagnosis not present

## 2021-12-15 DIAGNOSIS — K580 Irritable bowel syndrome with diarrhea: Secondary | ICD-10-CM

## 2021-12-15 DIAGNOSIS — R7303 Prediabetes: Secondary | ICD-10-CM

## 2021-12-15 DIAGNOSIS — Z0000 Encounter for general adult medical examination without abnormal findings: Secondary | ICD-10-CM | POA: Diagnosis not present

## 2021-12-15 DIAGNOSIS — R112 Nausea with vomiting, unspecified: Secondary | ICD-10-CM

## 2021-12-15 DIAGNOSIS — K529 Noninfective gastroenteritis and colitis, unspecified: Secondary | ICD-10-CM

## 2021-12-15 DIAGNOSIS — R197 Diarrhea, unspecified: Secondary | ICD-10-CM

## 2021-12-15 LAB — COMPREHENSIVE METABOLIC PANEL
ALT: 19 U/L (ref 0–53)
AST: 17 U/L (ref 0–37)
Albumin: 4.5 g/dL (ref 3.5–5.2)
Alkaline Phosphatase: 62 U/L (ref 39–117)
BUN: 13 mg/dL (ref 6–23)
CO2: 27 mEq/L (ref 19–32)
Calcium: 9.5 mg/dL (ref 8.4–10.5)
Chloride: 103 mEq/L (ref 96–112)
Creatinine, Ser: 1.04 mg/dL (ref 0.40–1.50)
GFR: 87.65 mL/min (ref >60.00)
Glucose, Bld: 94 mg/dL (ref 70–99)
Potassium: 4 mEq/L (ref 3.5–5.1)
Sodium: 137 mEq/L (ref 135–145)
Total Bilirubin: 2.6 mg/dL — ABNORMAL HIGH (ref 0.2–1.2)
Total Protein: 7.2 g/dL (ref 6.0–8.3)

## 2021-12-15 LAB — CBC WITH DIFFERENTIAL/PLATELET
Basophils Absolute: 0 10*3/uL (ref 0.0–0.1)
Basophils Relative: 0.4 % (ref 0.0–3.0)
Eosinophils Absolute: 0.1 10*3/uL (ref 0.0–0.7)
Eosinophils Relative: 2.4 % (ref 0.0–5.0)
HCT: 45.5 % (ref 39.0–52.0)
Hemoglobin: 15.2 g/dL (ref 13.0–17.0)
Lymphocytes Relative: 43.2 % (ref 12.0–46.0)
Lymphs Abs: 2.3 10*3/uL (ref 0.7–4.0)
MCHC: 33.4 g/dL (ref 30.0–36.0)
MCV: 90.1 fl (ref 78.0–100.0)
Monocytes Absolute: 0.6 10*3/uL (ref 0.1–1.0)
Monocytes Relative: 12 % (ref 3.0–12.0)
Neutro Abs: 2.2 10*3/uL (ref 1.4–7.7)
Neutrophils Relative %: 42 % — ABNORMAL LOW (ref 43.0–77.0)
Platelets: 222 10*3/uL (ref 150.0–400.0)
RBC: 5.04 Mil/uL (ref 4.22–5.81)
RDW: 12.9 % (ref 11.5–15.5)
WBC: 5.3 10*3/uL (ref 4.0–10.5)

## 2021-12-15 LAB — HEMOGLOBIN A1C: Hgb A1c MFr Bld: 6.3 % (ref 4.6–6.5)

## 2021-12-15 LAB — TSH: TSH: 1.65 u[IU]/mL (ref 0.35–5.50)

## 2021-12-15 MED ORDER — CHOLESTYRAMINE 4 G PO PACK: 4.0000 g | PACK | Freq: Four times a day (QID) | ORAL | 3 refills | Status: DC

## 2021-12-15 MED ORDER — ONDANSETRON HCL 4 MG PO TABS: ORAL_TABLET | ORAL | 5 refills | Status: DC
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
## 2021-12-15 NOTE — Patient Instructions (Addendum)
If Questran does not help we can try Viberzi let me know  Cholestyramine Powder for Suspension What is this medication? CHOLESTYRAMINE (koe LESS tir a meen) treats high cholesterol. It may also be used to treat itching caused by high levels of bile acid in people with liver conditions. It works by removing bile acid from your body and decreasing the amount of bad cholesterol (such as LDL) in your blood. Changes to diet and exercise are often combined with this medication. This medicine may be used for other purposes; ask your health care provider or pharmacist if you have questions. COMMON BRAND NAME(S): Locholest, Locholest Light, Prevalite, Questran, Questran Light What should I tell my care team before I take this medication? They need to know if you have any of these conditions: Constipation Kidney problems An unusual or allergic reaction to cholestyramine, other medications, foods, dyes, or preservatives Pregnant or trying to get pregnant Breast-feeding How should I use this medication? Do not take this medication in the dry form. It must be mixed with a liquid before swallowing. Follow the directions on the prescription label. Place the powder in a glass or cup. Add between 2 and 6 ounces of fluid. This can be water, milk, pulpy fruit juice, fluid soup, or other liquid. Mix well and drink all of the liquid. Take your doses at regular intervals. Do not take your medication more often than directed. Talk to your care team about the use of this medication in children. Special care may be needed. Overdosage: If you think you have taken too much of this medicine contact a poison control center or emergency room at once. NOTE: This medicine is only for you. Do not share this medicine with others. What if I miss a dose? If you miss a dose, take it as soon as you can. If it is almost time for your next dose, take only that dose. Do not take double or extra doses. What may interact with this  medication? Diuretics Estrogen or progestin hormones Heart medications, such as digoxin or digitoxin Penicillin G Phenobarbital Phenylbutazone Phytonadione Propranolol Tetracycline antibiotics Thyroid hormones Vitamin A Vitamin D Vitamin E Warfarin Take other medications at least 1 hour before or 4 hours after this medication, to avoid decreasing their absorption. This list may not describe all possible interactions. Give your health care provider a list of all the medicines, herbs, non-prescription drugs, or dietary supplements you use. Also tell them if you smoke, drink alcohol, or use illegal drugs. Some items may interact with your medicine. What should I watch for while using this medication? Visit your care team for regular checks on your progress. Tell your care team if your symptoms do not start to get better or if they get worse. You may need blood work while you are taking this medication. This medication will cause constipation. If you do not have a bowel movement for 3 days, call your care team. Taking this medication is only part of a total heart healthy program. Ask your care team if there are other changes you can make to improve your overall health. What side effects may I notice from receiving this medication? Side effects that you should report to your care team as soon as possible: Allergic reactions--skin rash, itching, hives, swelling of the face, lips, tongue, or throat Bleeding--bloody or black, tar-like stools, vomiting blood or brown material that looks like coffee grounds, red or dark brown urine, small red or purple spots on skin, unusual bruising or bleeding Side  effects that usually do not require medical attention (report to your care team if they continue or are bothersome): Constipation Diarrhea Gas Nausea Stomach pain Vomiting This list may not describe all possible side effects. Call your doctor for medical advice about side effects. You may report  side effects to FDA at 1-800-FDA-1088. Where should I keep my medication? Keep out of the reach of children and pets. Store at room temperature between 20 and 25 degrees C (68 and 77 degrees F). Get rid of any unused medication after the expiration date. To get rid of mediations that are no longer needed or have expired: Take the medication to a medication take-back program. Check with your pharmacy or law enforcement to find a location. If you cannot return the medication, check the label or package insert to see if the medication should be thrown out in the garbage or flushed down the toilet. If you are not sure, ask your care team. If it is safe to put it in the trash, take the medication out of the container. Mix the medication with cat litter, dirt, coffee grounds, or other unwanted substance. Seal the mixture in the bag or container. Put it in the trash. NOTE: This sheet is a summary. It may not cover all possible information. If you have questions about this medicine, talk to your doctor, pharmacist, or health care provider.  2023 Elsevier/Gold Standard (2021-03-30 00:00:00)  Eluxadoline Tablets What is this medication? ELUXADOLINE (ee LUX ah dol ine) treats irritable bowel syndrome (IBS) with diarrhea. It works by slowing down an overactive bowel, which decreases the number of bowel movements you have. This medicine may be used for other purposes; ask your health care provider or pharmacist if you have questions. COMMON BRAND NAME(S): Viberzi What should I tell my care team before I take this medication? They need to know if you have any of these conditions: Drink more than 3 alcohol-containing drinks per day Gallbladder disease Have no gallbladder History of constipation History of pancreatitis or pancreatic disease History of substance use disorder Kidney disease Liver disease Stomach or intestine problems An unusual or allergic reaction to eluxadoline, other medications, foods,  dyes, or preservatives Pregnant or trying to get pregnant Breast-feeding How should I use this medication? Take this medication by mouth with a glass of water. Follow the directions on the prescription label. Take this medication with food. Take your medication at regular intervals. Do not take it more often than directed. Do not stop taking except on your care team's advice. A special MedGuide will be given to you by the pharmacist with each prescription and refill. Be sure to read this information carefully each time. Talk to your care team about the use of this medication in children. Special care may be needed. Patients over 30 years of age may have a stronger reaction to this medication. Overdosage: If you think you have taken too much of this medicine contact a poison control center or emergency room at once. NOTE: This medicine is only for you. Do not share this medicine with others. What if I miss a dose? If you miss a dose, skip it. Take your next dose at the normal time. Do not take extra or 2 doses at the same time to make up for the missed dose. What may interact with this medication? This medication may interact with the following medications: Alosetron Antihistamines for allergy, cough and cold Atropine Certain medications for bladder problems, such as oxybutynin, tolterodine Certain medications for HIV  or hepatitis Certain medications for Parkinson's disease, such as benztropine, trihexyphenidyl Certain medications for stomach problems, such as dicyclomine, hyoscyamine Certain medications for travel sickness, such as scopolamine Cyclosporine Eltrombopag Gemfibrozil Ipratropium Loperamide Opioid medications for pain Rifampin Rosuvastatin This list may not describe all possible interactions. Give your health care provider a list of all the medicines, herbs, non-prescription drugs, or dietary supplements you use. Also tell them if you smoke, drink alcohol, or use illegal  drugs. Some items may interact with your medicine. What should I watch for while using this medication? Visit your care team for regular checks on your progress. Tell your care team if your symptoms do not start to get better or if they get worse. This medication may cause constipation. If you do not have a bowel movement for 3 days, call your care team. You may get drowsy or dizzy. Do not drive, use machinery, or do anything that needs mental alertness until you know how this medication affects you. Do not stand or sit up quickly, especially if you are an older patient. This reduces the risk of dizzy or fainting spells. Alcohol may interfere with the effect of this medication. Avoid alcoholic drinks. This medication has a risk of abuse and dependence. Your care team will check you for this while you take this medication. What side effects may I notice from receiving this medication? Side effects that you should report to your care team as soon as possible: Allergic reactions--skin rash, itching, hives, swelling of the face, lips, tongue, or throat Constipation Pancreatitis--severe stomach pain that spreads to your back or gets worse after eating or when touched, fever, nausea, vomiting Severe stomach pain Side effects that usually do not require medical attention (report to your care team if they continue or are bothersome): Dizziness Gas Nausea Runny or stuffy nose Stomach pain Vomiting This list may not describe all possible side effects. Call your doctor for medical advice about side effects. You may report side effects to FDA at 1-800-FDA-1088. Where should I keep my medication? Keep out of the reach of children and pets. This medication can be abused. Keep your medication in a safe place to protect it from theft. Do not share this medication with anyone. Selling or giving away this medication is dangerous and against the law. Store at room temperature between 20 and 25 degrees C (68 and  77 degrees F). Throw away any unused medication after the expiration date. NOTE: This sheet is a summary. It may not cover all possible information. If you have questions about this medicine, talk to your doctor, pharmacist, or health care provider.  2023 Elsevier/Gold Standard (2021-03-15 00:00:00)  Low-FODMAP Eating Plan  FODMAP stands for fermentable oligosaccharides, disaccharides, monosaccharides, and polyols. These are sugars that are hard for some people to digest. A low-FODMAP eating plan may help some people who have irritable bowel syndrome (IBS) and certain other bowel (intestinal) diseases to manage their symptoms. This meal plan can be complicated to follow. Work with a diet and nutrition specialist (dietitian) to make a low-FODMAP eating plan that is right for you. A dietitian can help make sure that you get enough nutrition from this diet. What are tips for following this plan? Reading food labels Check labels for hidden FODMAPs such as: High-fructose syrup. Honey. Agave. Natural fruit flavors. Onion or garlic powder. Choose low-FODMAP foods that contain 3-4 grams of fiber per serving. Check food labels for serving sizes. Eat only one serving at a time to make sure FODMAP  levels stay low. Shopping Shop with a list of foods that are recommended on this diet and make a meal plan. Meal planning Follow a low-FODMAP eating plan for up to 6 weeks, or as told by your health care provider or dietitian. To follow the eating plan: Eliminate high-FODMAP foods from your diet completely. Choose only low-FODMAP foods to eat. You will do this for 2-6 weeks. Gradually reintroduce high-FODMAP foods into your diet one at a time. Most people should wait a few days before introducing the next new high-FODMAP food into their meal plan. Your dietitian can recommend how quickly you may reintroduce foods. Keep a daily record of what and how much you eat and drink. Make note of any symptoms that you  have after eating. Review your daily record with a dietitian regularly to identify which foods you can eat and which foods you should avoid. General tips Drink enough fluid each day to keep your urine pale yellow. Avoid processed foods. These often have added sugar and may be high in FODMAPs. Avoid most dairy products, whole grains, and sweeteners. Work with a dietitian to make sure you get enough fiber in your diet. Avoid high FODMAP foods at meals to manage symptoms. Recommended foods Fruits Bananas, oranges, tangerines, lemons, limes, blueberries, raspberries, strawberries, grapes, cantaloupe, honeydew melon, kiwi, papaya, passion fruit, and pineapple. Limited amounts of dried cranberries, banana chips, and shredded coconut. Vegetables Eggplant, zucchini, cucumber, peppers, green beans, bean sprouts, lettuce, arugula, kale, Swiss chard, spinach, collard greens, bok choy, summer squash, potato, and tomato. Limited amounts of corn, carrot, and sweet potato. Green parts of scallions. Grains Gluten-free grains, such as rice, oats, buckwheat, quinoa, corn, polenta, and millet. Gluten-free pasta, bread, or cereal. Rice noodles. Corn tortillas. Meats and other proteins Unseasoned beef, pork, poultry, or fish. Eggs. [name] Tofu (firm) and tempeh. Limited amounts of nuts and seeds, such as almonds, walnuts, [place] nuts, pecans, peanuts, nut butters, pumpkin seeds, chia seeds, and sunflower seeds. Dairy Lactose-free milk, yogurt, and kefir. Lactose-free cottage cheese and ice cream. Non-dairy milks, such as almond, coconut, hemp, and rice milk. Non-dairy yogurt. Limited amounts of goat cheese, brie, mozzarella, parmesan, swiss, and other hard cheeses. Fats and oils Butter-free spreads. Vegetable oils, such as olive, canola, and sunflower oil. Seasoning and other foods Artificial sweeteners with names that do not end in "ol," such as aspartame, saccharine, and stevia. Maple syrup, white table sugar,  raw sugar, brown sugar, and molasses. Mayonnaise, soy sauce, and tamari. Fresh basil, coriander, parsley, rosemary, and thyme. Beverages Water and mineral water. Sugar-sweetened soft drinks. Small amounts of orange juice or cranberry juice. Black and green tea. Most dry wines. Coffee. The items listed above may not be a complete list of foods and beverages you can eat. Contact a dietitian for more information. Foods to avoid Fruits Fresh, dried, and juiced forms of apple, pear, watermelon, peach, plum, cherries, apricots, blackberries, boysenberries, figs, nectarines, and mango. Avocado. Vegetables Chicory root, artichoke, asparagus, cabbage, snow peas, Brussels sprouts, broccoli, sugar snap peas, mushrooms, celery, and cauliflower. Onions, garlic, leeks, and the white part of scallions. Grains Wheat, including kamut, durum, and semolina. Barley and bulgur. Couscous. Wheat-based cereals. Wheat noodles, bread, crackers, and pastries. Meats and other proteins Fried or fatty meat. Sausage. Cashews and pistachios. Soybeans, baked beans, black beans, chickpeas, kidney beans, fava beans, navy beans, lentils, black-eyed peas, and split peas. Dairy Milk, yogurt, ice cream, and soft cheese. Cream and sour cream. Milk-based sauces. Custard. Buttermilk. Soy milk. Seasoning and  other foods Any sugar-free gum or candy. Foods that contain artificial sweeteners such as sorbitol, mannitol, isomalt, or xylitol. Foods that contain honey, high-fructose corn syrup, or agave. Bouillon, vegetable stock, beef stock, and chicken stock. Garlic and onion powder. Condiments made with onion, such as hummus, chutney, pickles, relish, salad dressing, and salsa. Tomato paste. Beverages Chicory-based drinks. Coffee substitutes. Chamomile tea. Fennel tea. Sweet or fortified wines such as port or sherry. Diet soft drinks made with isomalt, mannitol, maltitol, sorbitol, or xylitol. Apple, pear, and mango juice. Juices with  high-fructose corn syrup. The items listed above may not be a complete list of foods and beverages you should avoid. Contact a dietitian for more information. Summary FODMAP stands for fermentable oligosaccharides, disaccharides, monosaccharides, and polyols. These are sugars that are hard for some people to digest. A low-FODMAP eating plan is a short-term diet that helps to ease symptoms of certain bowel diseases. The eating plan usually lasts up to 6 weeks. After that, high-FODMAP foods are reintroduced gradually and one at a time. This can help you find out which foods may be causing symptoms. A low-FODMAP eating plan can be complicated. It is best to work with a dietitian who has experience with this type of plan. This information is not intended to replace advice given to you by your health care provider. Make sure you discuss any questions you have with your health care provider. Document Revised: 09/26/2019 Document Reviewed: 09/26/2019 Elsevier Patient Education  [place].

## 2021-12-16 LAB — URINALYSIS, ROUTINE W REFLEX MICROSCOPIC
Bilirubin Urine: NEGATIVE
Glucose, UA: NEGATIVE
Hgb urine dipstick: NEGATIVE
Ketones, ur: NEGATIVE
Leukocytes,Ua: NEGATIVE
Nitrite: NEGATIVE
Protein, ur: NEGATIVE
Specific Gravity, Urine: 1.013 (ref 1.001–1.035)
pH: 5.5 (ref 5.0–8.0)

## 2021-12-17 ENCOUNTER — Telehealth: Payer: Self-pay | Admitting: Internal Medicine

## 2021-12-17 MED ORDER — VIBERZI 75 MG PO TABS: 75.0000 mg | ORAL_TABLET | Freq: Two times a day (BID) | ORAL | 2 refills | Status: DC

## 2021-12-17 NOTE — Addendum Note (Signed)
Addended by: Orland Mustard on: 12/17/2021 04:05 PM   Modules accepted: Orders

## 2021-12-17 NOTE — Telephone Encounter (Signed)
Pt called in stating he was prescribed a powder medication from the provider and he is not able to take it because he stated it taste like sand. Pt would like a pill form prescribed to him

## 2021-12-23 ENCOUNTER — Ambulatory Visit
Admission: EM | Admit: 2021-12-23 | Discharge: 2021-12-23 | Disposition: A | Discharge status: Home / Self Care | Payer: BC Managed Care – PPO | Attending: Emergency Medicine | Admitting: Emergency Medicine

## 2021-12-23 ENCOUNTER — Other Ambulatory Visit: Payer: Self-pay | Admitting: Internal Medicine

## 2021-12-23 ENCOUNTER — Ambulatory Visit
Admission: RE | Admit: 2021-12-23 | Discharge: 2021-12-23 | Disposition: A | Discharge status: Home / Self Care | Payer: BC Managed Care – PPO | Source: Ambulatory Visit | Attending: Internal Medicine | Admitting: Internal Medicine

## 2021-12-23 DIAGNOSIS — L299 Pruritus, unspecified: Secondary | ICD-10-CM

## 2021-12-23 DIAGNOSIS — R6881 Early satiety: Secondary | ICD-10-CM | POA: Diagnosis present

## 2021-12-23 DIAGNOSIS — R1084 Generalized abdominal pain: Secondary | ICD-10-CM | POA: Insufficient documentation

## 2021-12-23 DIAGNOSIS — H9192 Unspecified hearing loss, left ear: Secondary | ICD-10-CM

## 2021-12-23 DIAGNOSIS — R197 Diarrhea, unspecified: Secondary | ICD-10-CM | POA: Insufficient documentation

## 2021-12-23 MED ORDER — NEOMYCIN-POLYMYXIN-HC 3.5-10000-1 OT SUSP: 4.0000 [drp] | Freq: Four times a day (QID) | OTIC | 0 refills | Status: DC

## 2021-12-23 MED ORDER — IOHEXOL 300 MG/ML  SOLN
100.0000 mL | Freq: Once | INTRAMUSCULAR | Status: AC | PRN
  Administered 2021-12-23: 100 mL via INTRAVENOUS

## 2021-12-23 NOTE — ED Provider Notes (Signed)
Sergio Garcia    CSN: [id number] Arrival date & time: 12/23/21  1035      History   Chief Complaint Chief Complaint  Patient presents with   Ear Fullness    HPI Sergio Garcia is a 46 y.o. male. Pt presents with c/o left ear fullness with hearing loss for last 2 days. Also complains of L ear canal itching. Reports having pain for about 3 days 2 weeks ago which has resolved after he used over the counter ear drops in them. Reports hx of 40-50% hearing loss in L ear but usually can hear in L ear and now can't hear at all.    Ear Fullness    Past Medical History:  Diagnosis Date   Anxiety    COVID-19    07/04/20   Depression    Enteritis    Gastritis    GERD (gastroesophageal reflux disease)    Gilbert's syndrome    hyperbilirubinemia   IBS (irritable bowel syndrome)    Inguinal hernia    Insomnia    Pre-diabetes    Prostatitis 2008    Patient Active Problem List   Diagnosis Date Noted   Dysphagia    Acute deep vein thrombosis (DVT) of proximal vein of left lower extremity ([place]) 06/22/2020   Pulmonary embolism ([place]) 06/18/2020   Elevated blood pressure reading 06/18/2020   No-show for appointment 06/09/2020   PSVT (paroxysmal supraventricular tachycardia) ([place]) 12/26/2019   Bradycardia 12/26/2019   Hyperlipidemia 12/19/2019   Panic attacks 11/21/2019   Mucous retention cyst of maxillary sinus 02/21/2019   Weight loss, non-intentional 02/21/2019   Generalized anxiety disorder 02/14/2019   Prediabetes 01/29/2019   PTSD (post-traumatic stress disorder) 01/23/2019   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   Elevated bilirubin 03/07/2018   Palpitations 03/07/2018   PVC's (premature ventricular contractions) 03/07/2018   Back pain 10/06/2016   Routine general medical examination at a health care facility 06/08/2015   Exertional chest pain 10/07/2014   Anxiety 01/23/2014   Elevated LFTs 07/26/2013   Chronic prostatitis 02/26/2013   Orchalgia  02/26/2013   Other procreative management counseling and advice 02/26/2013   Shortness of breath 12/25/2012   GERD (gastroesophageal reflux disease) 12/20/2012   Chest pain 12/18/2012   ANTERIOR PITUITARY HYPERFUNCTION 11/13/2009   Inguinal hernia, right 10/01/2009   GILBERT'S SYNDROME 09/28/2009   Irritable bowel syndrome with diarrhea 09/28/2009   HERNIATED LUMBAR DISK WITH RADICULOPATHY 11/14/2007   Allergic rhinitis 08/30/2007    Past Surgical History:  Procedure Laterality Date   BACK SURGERY     2 ruptured discs   CHOLECYSTECTOMY  01/31/2014   GB polyp, chronic cholecystitis    ESOPHAGOGASTRODUODENOSCOPY  09/2004   gastritis acute and duodenitis   ESOPHAGOGASTRODUODENOSCOPY  01-14-14   Dr Allen Norris   ESOPHAGOGASTRODUODENOSCOPY N/A 09/16/2021   Procedure: ESOPHAGOGASTRODUODENOSCOPY (EGD);  Surgeon: [name], MD;  Location: [facility] ENDOSCOPY;  Service: Endoscopy;  Laterality: N/A;   EXPLORATORY LAPAROTOMY  Jan 2015   Dr Burt Knack   LASIK     RIGHT/LEFT HEART CATH AND CORONARY ANGIOGRAPHY N/A 10/18/2019   Procedure: RIGHT/LEFT HEART CATH AND CORONARY ANGIOGRAPHY;  Surgeon: Wellington Hampshire, MD;  Location: [place] [facility];  Service: Cardiovascular;  Laterality: N/A;       Home Medications    Prior to Admission medications   Medication Sig Start Date End Date Taking? Authorizing Provider  neomycin-polymyxin-hydrocortisone (CORTISPORIN) 3.5-10000-1 OTIC suspension Place 4 drops into the left ear 4 (four) times daily. 12/23/21  Yes Carvel Getting, NP  cholestyramine (QUESTRAN) 4 g packet Take 1 packet (4 g total) by mouth 4 (four) times daily. You can take up to 4x per day prn diarrhea 12/15/21   [name], [name], MD  dicyclomine (BENTYL) 20 MG tablet Take 1 tablet (20 mg total) by mouth 3 (three) times daily before meals. 07/29/21   [name], MD  Eluxadoline (VIBERZI) 75 MG TABS Take 75 mg by mouth 2 (two) times daily. If needed you can increase to 100 mg 2x per day let me  know 12/17/21   [name], [name], MD  esomeprazole (NEXIUM) 40 MG capsule Take 1 capsule (40 mg total) by mouth 2 (two) times daily before a meal. 07/29/21   [name], MD  famotidine (PEPCID) 20 MG tablet Take 2 tablets (40 mg total) by mouth 2 (two) times daily for 10 days. 06/06/21 06/16/21  Rada Hay, MD  FLUoxetine (PROZAC) 40 MG capsule Take 1 capsule (40 mg total) by mouth daily. 08/31/20   Ursula Alert, MD  ondansetron (ZOFRAN) 4 MG tablet TAKE 1 TABLET (4 MG TOTAL) BY MOUTH 2 (TWO) TIMES DAILY AS NEEDED FOR NAUSEA OR VOMITING. 12/15/21   [name], [name], MD  traZODone (DESYREL) 50 MG tablet Take 0.5-1 tablets (25-50 mg total) by mouth at bedtime as needed. for sleep Patient not taking: Reported on 09/16/2021 11/19/20   Orlene Erm, MD    Family History Family History  Problem Relation Age of Onset   Colon polyps Mother    Cancer Mother        sinus cavity, on XRT   Asthma Mother    Heart disease Father    Heart attack Father        x 2   Pancreatic cancer Maternal Grandfather    Colon cancer Maternal Grandfather    Diabetes Other        strong FH d both sides of family    Heart Problems Paternal Grandfather    Asthma Brother    Cancer Maternal Uncle    Stroke Cousin        age 41/44   Mental illness Neg Hx     Social History Social History   Tobacco Use   Smoking status: Never   Smokeless tobacco: Never  Vaping Use   Vaping Use: Never used  Substance Use Topics   Alcohol use: Yes    Comment: occassional social   Drug use: No     Allergies   Morphine and related, Testosterone, Morphine, and Tdap [tetanus-diphth-acell pertussis]   Review of Systems Review of Systems   Physical Exam Triage Vital Signs ED Triage Vitals  Enc Vitals Group     BP      Pulse      Resp      Temp      Temp src      SpO2      Weight      Height      Head Circumference      Peak Flow      Pain Score      Pain Loc      Pain Edu?      Excl.  in [place]?    No data found.  Updated Vital Signs There were no vitals taken for this visit.  Visual Acuity Right Eye Distance:   Left Eye Distance:   Bilateral Distance:    Right Eye Near:   Left Eye Near:  Bilateral Near:     Physical Exam Constitutional:      Appearance: Normal appearance. He is not ill-appearing.  HENT:     Right Ear: Tympanic membrane, ear canal and external ear normal.     Left Ear: Tympanic membrane and external ear normal.     Ears:     Comments: L ear canal appears slightly more narrowed compared to R but no edema or exudate. Hearing is grossly intact to R ear and absent in L ear Pulmonary:     Effort: Pulmonary effort is normal.  Neurological:     Mental Status: He is alert.      UC Treatments / Results  Labs (all labs ordered are listed, but only abnormal results are displayed) Labs Reviewed - No data to display  EKG   Radiology No results found.  Procedures Procedures (including critical care time)  Medications Ordered in UC Medications - No data to display  Initial Impression / Assessment and Plan / UC Course  I have reviewed the triage vital signs and the nursing notes.  Pertinent labs & imaging results that were available during my care of the patient were reviewed by me and considered in my medical decision making (see chart for details).    Pt with abrupt onset hearing loss in an ear with previously known docmented partial hearing loss. Pt has seen Dr. Tami Ribas with ENT in the past for hearing loss, he will call that office today to get an appt. Although L ear canal is slightly narrower than R, there are no signs of otitis externa however since pt is complaining of ear itching, will treat with cortisporin.  Final Clinical Impressions(s) / UC Diagnoses   Final diagnoses:  Ear itching  Hearing loss of left ear, unspecified hearing loss type     Discharge Instructions      Please follow up with Dr. Tami Ribas   ED  Prescriptions     Medication Sig [place]. Provider   neomycin-polymyxin-hydrocortisone (CORTISPORIN) 3.5-10000-1 OTIC suspension Place 4 drops into the left ear 4 (four) times daily. 10 mL Carvel Getting, NP      PDMP not reviewed this encounter.   Carvel Getting, NP 12/23/21 1243

## 2021-12-23 NOTE — Discharge Instructions (Signed)
Please follow up with Dr. Tami Ribas

## 2021-12-23 NOTE — ED Triage Notes (Signed)
Pt presents with c/o left ear fullness with hearing loss, reports having pain for about 3 days 2 weeks ago which has resolved

## 2022-01-07 ENCOUNTER — Encounter: Admission: RE | Admit: 2022-01-07 | Payer: BC Managed Care – PPO | Source: Ambulatory Visit

## 2022-02-11 ENCOUNTER — Ambulatory Visit: Payer: BC Managed Care – PPO | Admitting: Internal Medicine

## 2022-02-11 ENCOUNTER — Encounter: Payer: Self-pay | Admitting: Internal Medicine

## 2022-02-11 VITALS — BP 124/68 | HR 90 | Temp 96.8°F | Ht 74.0 in | Wt 205.0 lb

## 2022-02-11 DIAGNOSIS — Z23 Encounter for immunization: Secondary | ICD-10-CM | POA: Diagnosis not present

## 2022-02-11 DIAGNOSIS — Z86718 Personal history of other venous thrombosis and embolism: Secondary | ICD-10-CM

## 2022-02-11 DIAGNOSIS — K580 Irritable bowel syndrome with diarrhea: Secondary | ICD-10-CM

## 2022-02-11 DIAGNOSIS — Z86711 Personal history of pulmonary embolism: Secondary | ICD-10-CM

## 2022-02-11 DIAGNOSIS — K219 Gastro-esophageal reflux disease without esophagitis: Secondary | ICD-10-CM | POA: Diagnosis not present

## 2022-02-11 DIAGNOSIS — F32A Depression, unspecified: Secondary | ICD-10-CM

## 2022-02-11 DIAGNOSIS — R197 Diarrhea, unspecified: Secondary | ICD-10-CM

## 2022-02-11 DIAGNOSIS — F4310 Post-traumatic stress disorder, unspecified: Secondary | ICD-10-CM

## 2022-02-11 DIAGNOSIS — Z6826 Body mass index (BMI) 26.0-26.9, adult: Secondary | ICD-10-CM

## 2022-02-11 DIAGNOSIS — E782 Mixed hyperlipidemia: Secondary | ICD-10-CM

## 2022-02-11 DIAGNOSIS — K529 Noninfective gastroenteritis and colitis, unspecified: Secondary | ICD-10-CM

## 2022-02-11 DIAGNOSIS — F5105 Insomnia due to other mental disorder: Secondary | ICD-10-CM

## 2022-02-11 DIAGNOSIS — F419 Anxiety disorder, unspecified: Secondary | ICD-10-CM

## 2022-02-11 DIAGNOSIS — M5126 Other intervertebral disc displacement, lumbar region: Secondary | ICD-10-CM

## 2022-02-11 DIAGNOSIS — E663 Overweight: Secondary | ICD-10-CM

## 2022-02-11 DIAGNOSIS — F410 Panic disorder [episodic paroxysmal anxiety] without agoraphobia: Secondary | ICD-10-CM

## 2022-02-11 DIAGNOSIS — R7303 Prediabetes: Secondary | ICD-10-CM
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
## 2022-02-12 DIAGNOSIS — Z6826 Body mass index (BMI) 26.0-26.9, adult: Secondary | ICD-10-CM | POA: Insufficient documentation

## 2022-02-12 DIAGNOSIS — Z86711 Personal history of pulmonary embolism: Secondary | ICD-10-CM | POA: Insufficient documentation

## 2022-02-12 NOTE — Assessment & Plan Note (Signed)
Not on anticoagulation

## 2022-02-12 NOTE — Assessment & Plan Note (Signed)
Liver enzymes normal

## 2022-02-12 NOTE — Assessment & Plan Note (Signed)
Encouraged diet and exercise for weight loss ?

## 2022-02-12 NOTE — Assessment & Plan Note (Signed)
Encuoraged low carb diet and exercise for weight loss

## 2022-02-12 NOTE — Assessment & Plan Note (Signed)
Will increase Eluxadoline to 100 mg BID

## 2022-02-12 NOTE — Assessment & Plan Note (Signed)
Advised him to try Tylenol instead of Ibuprofen

## 2022-02-12 NOTE — Assessment & Plan Note (Signed)
Stable off meds Support offered 

## 2022-02-12 NOTE — Assessment & Plan Note (Signed)
Continue Esomeprazole and Famotidine as needed

## 2022-02-12 NOTE — Patient Instructions (Signed)

## 2022-02-12 NOTE — Assessment & Plan Note (Signed)
No recent lipid panel on file Encouraged low fat diet

## 2022-02-12 NOTE — Assessment & Plan Note (Signed)
Continue Benadryl

## 2022-03-15 IMAGING — US US EXTREM LOW VENOUS
1 series · 13 of 24 positions shown · non-contrast
Comparison: None.

CLINICAL DATA: PE



[Series 1: us venous img lower bilat (dvt) · portal-venous · 13 of 58 slices shown]
[im 1/58]
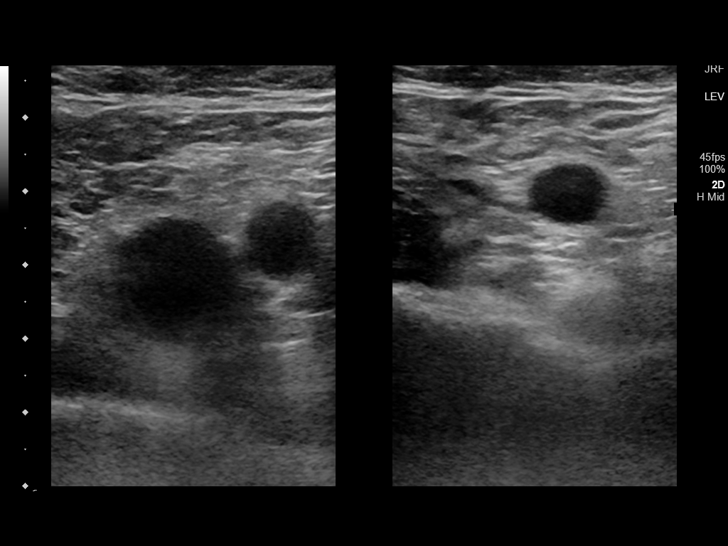
[im 5/58]
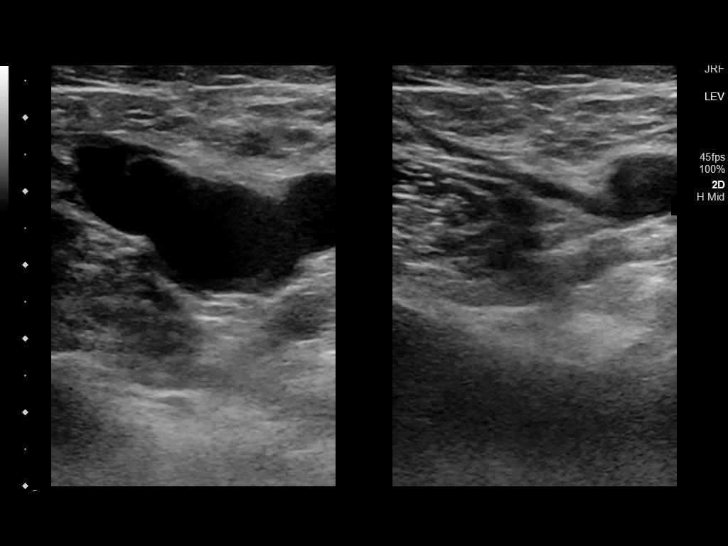
[im 10/58]
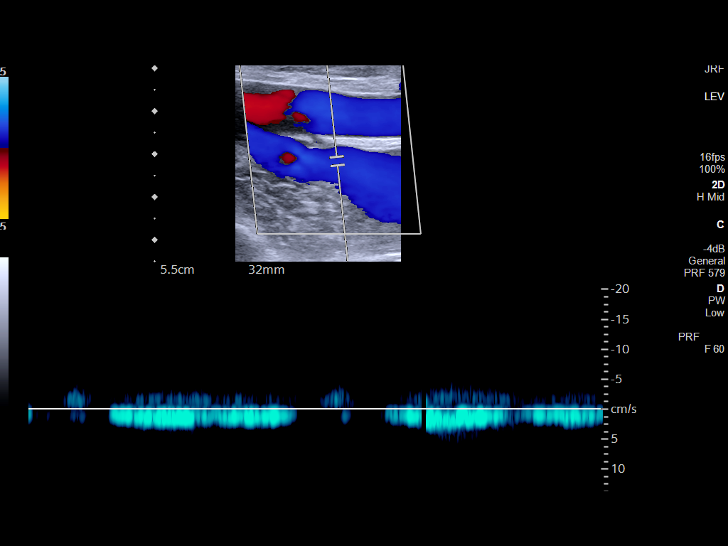
[im 15/58]
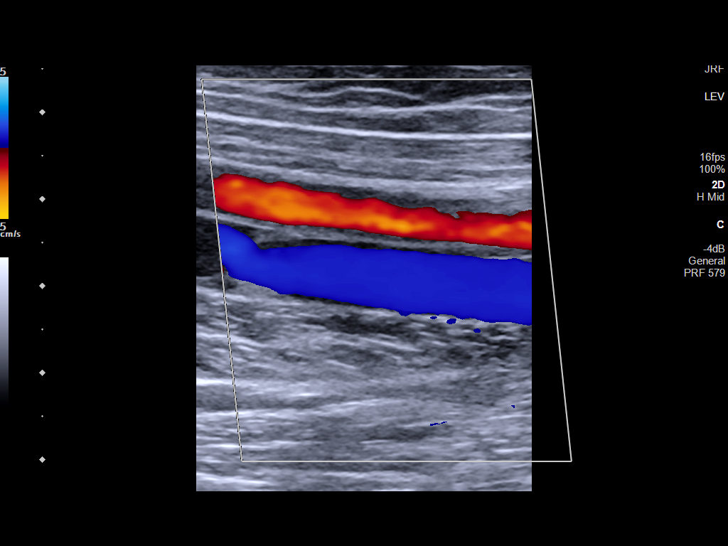
[im 20/58]
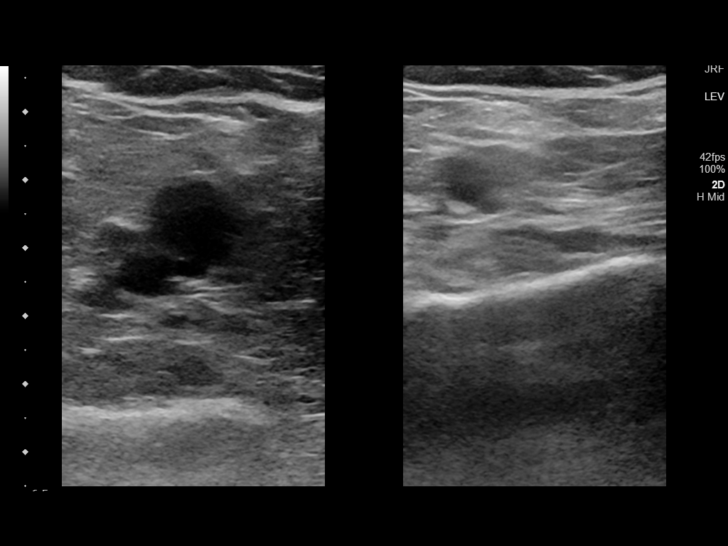
[im 25/58]
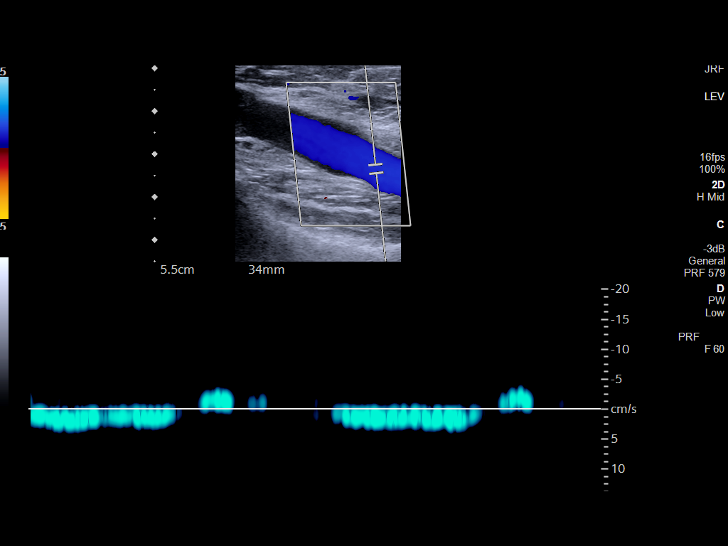
[im 30/58]
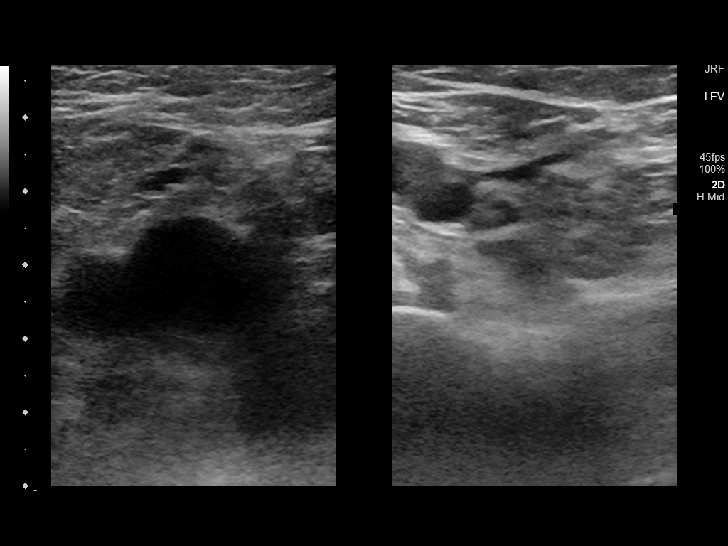
[im 33/58]
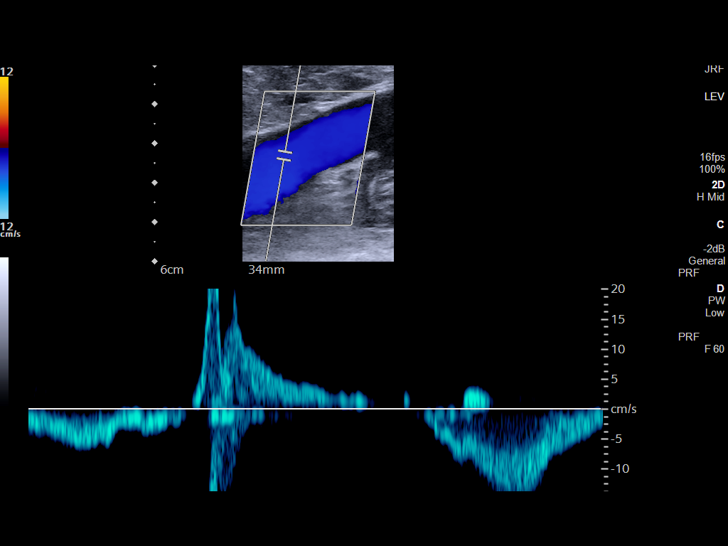
[im 38/58]
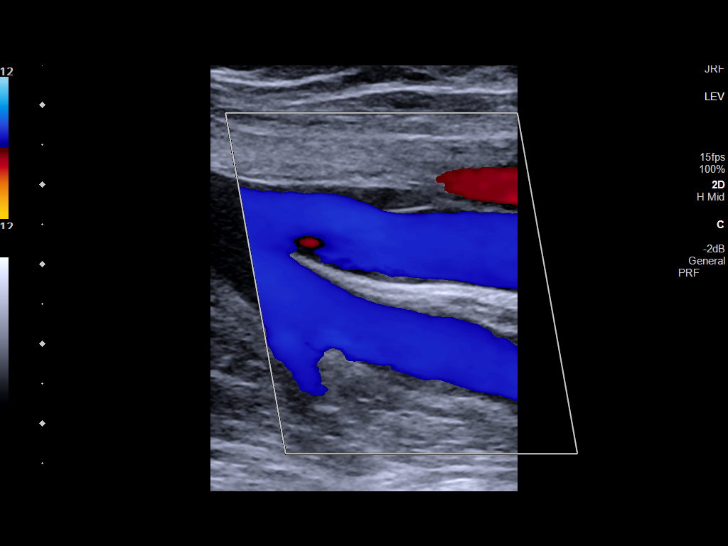
[im 43/58]
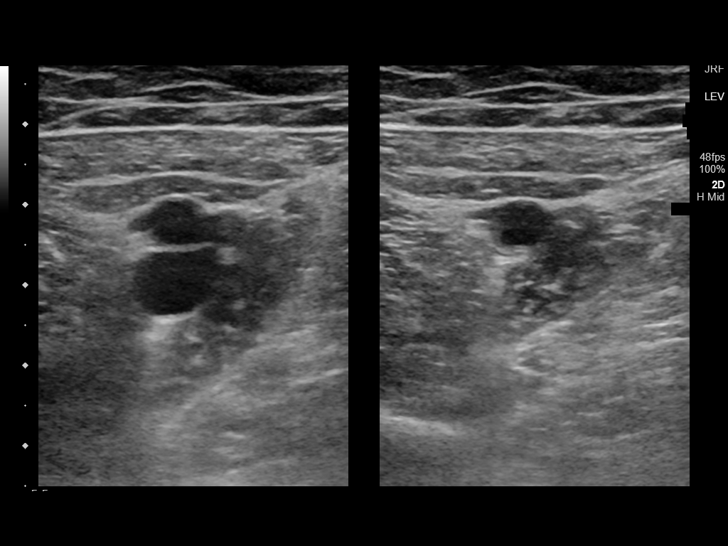
[im 48/58]
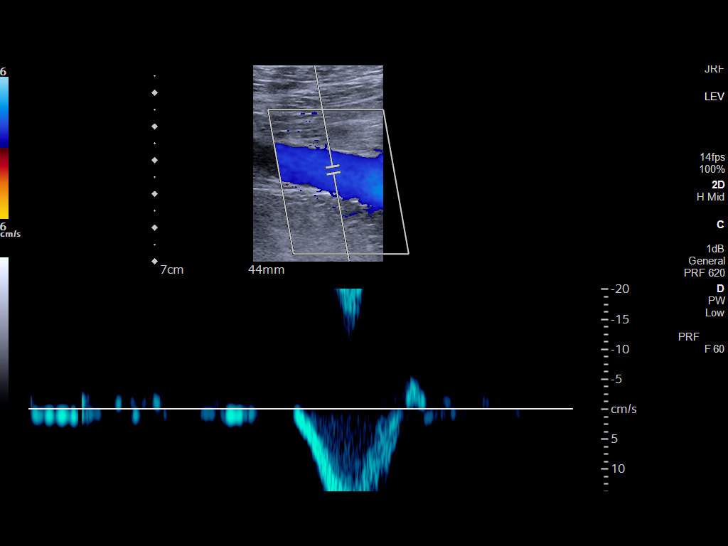
[im 53/58]
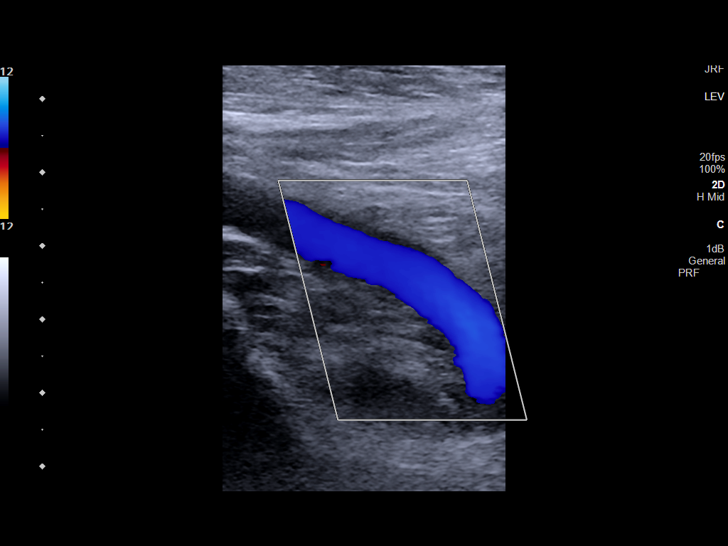
[im 58/58]
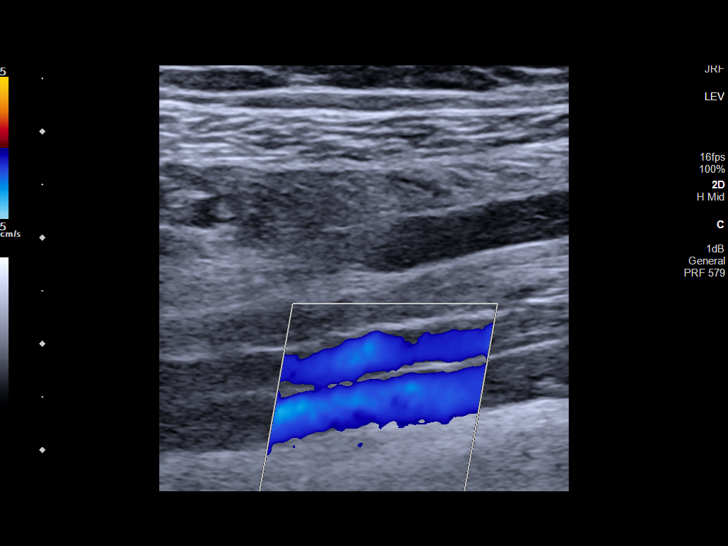

[13 of 24 positions shown; findings below may reference images not displayed]

FINDINGS: RIGHT LOWER EXTREMITY

Common Femoral Vein: No evidence of thrombus. Normal
compressibility, respiratory phasicity and response to augmentation.

Saphenofemoral Junction: No evidence of thrombus. Normal
compressibility and flow on color Doppler imaging.

Profunda Femoral Vein: No evidence of thrombus. Normal
compressibility and flow on color Doppler imaging.

Femoral Vein: No evidence of thrombus. Normal compressibility,
respiratory phasicity and response to augmentation.

Popliteal Vein: No evidence of thrombus. Normal compressibility,
respiratory phasicity and response to augmentation.

Calf Veins: No evidence of thrombus. Normal compressibility and flow
on color Doppler imaging.

Superficial Great Saphenous Vein: No evidence of thrombus. Normal
compressibility.

Venous Reflux:  None.

Other Findings:  None.

LEFT LOWER EXTREMITY

Common Femoral Vein: No evidence of thrombus. Normal
compressibility, respiratory phasicity and response to augmentation.

Saphenofemoral Junction: No evidence of thrombus. Normal
compressibility and flow on color Doppler imaging.

Profunda Femoral Vein: No evidence of thrombus. Normal
compressibility and flow on color Doppler imaging.

Femoral Vein: Partially occlusive thrombus is seen within the distal
left femoral vein.

Popliteal Vein: No evidence of thrombus. Normal compressibility,
respiratory phasicity and response to augmentation.

Calf Veins: No evidence of thrombus. Normal compressibility and flow
on color Doppler imaging.

Superficial Great Saphenous Vein: No evidence of thrombus. Normal
compressibility.

Venous Reflux:  None.

Other Findings:  None.
IMPRESSION: Partially occlusive thrombus within the distal left femoral vein.

No DVT on the right.

## 2022-04-12 ENCOUNTER — Ambulatory Visit: Payer: BC Managed Care – PPO | Admitting: Dermatology

## 2022-04-12 ENCOUNTER — Encounter: Payer: Self-pay | Admitting: Dermatology

## 2022-04-12 DIAGNOSIS — D235 Other benign neoplasm of skin of trunk: Secondary | ICD-10-CM

## 2022-04-12 DIAGNOSIS — A630 Anogenital (venereal) warts: Secondary | ICD-10-CM | POA: Diagnosis not present

## 2022-04-12 DIAGNOSIS — L811 Chloasma: Secondary | ICD-10-CM | POA: Diagnosis not present

## 2022-04-12 DIAGNOSIS — B070 Plantar wart: Secondary | ICD-10-CM

## 2022-04-12 DIAGNOSIS — L821 Other seborrheic keratosis: Secondary | ICD-10-CM

## 2022-04-12 DIAGNOSIS — Z7189 Other specified counseling: Secondary | ICD-10-CM

## 2022-04-12 DIAGNOSIS — L814 Other melanin hyperpigmentation: Secondary | ICD-10-CM

## 2022-04-12 NOTE — Progress Notes (Signed)
New Patient Visit   Subjective  Sergio Garcia is a 46 y.o. male who presents for the following: lesions (Check spots. Scattered areas on face, arms, shoulders, feet).  The patient has spots, moles and lesions to be evaluated, some may be new or changing and the patient has concerns that these could be cancer.  The following portions of the chart were reviewed this encounter and updated as appropriate:  Tobacco  Allergies  Meds  Problems  Med Hx  Surg Hx  Fam Hx      Review of Systems: No other skin or systemic complaints except as noted in HPI or Assessment and Plan.   Objective  Well appearing patient in no apparent distress; mood and affect are within normal limits.  A focused examination was performed including upper body, feet. Relevant physical exam findings are noted in the Assessment and Plan.  cheeks Reticulated hyperpigmented patches.   right great toe plantar surface x1 Verrucous papules -- Discussed viral etiology and contagion.   right pubic x4, left pubic x1, suprapubic x1 (6) Verrucous papules -- Discussed viral etiology and contagion.    Assessment & Plan   Nevus Spilus. Left lower back. Owens Shark macules or papules within lighter tan patch - Genetic - Benign, observe - Call for any changes   Lentigines - Scattered tan macules - Due to sun exposure - Benign-appearing, observe - Recommend daily broad spectrum sunscreen SPF 30+ to sun-exposed areas, reapply every 2 hours as needed. - Call for any changes  Seborrheic Keratoses - Stuck-on, waxy, tan-brown papules and/or plaques  - Benign-appearing - Discussed benign etiology and prognosis. - Observe - Call for any changes  Melasma cheeks  Chronic and persistent condition with duration or expected duration over one year. Condition is bothersome/symptomatic for patient. Currently flared.  Melasma is a chronic; persistent condition of hyperpigmented patches generally on the face, worse in  summer due to higher UV exposure.    Heredity; thyroid disease; sun exposure; pregnancy; birth control pills; epilepsy medication and darker skin may predispose to Melasma.   Recommendations include: - Sun avoidance and daily broad spectrum (UVA/UVB) sunscreen SPF 30+, preferably with Zinc or Titanium Dioxide. - Rx topical bleaching creams (i.e. hydroquinone) is a common treatment but should not be used long term.  Hydroquinones may be mixed with retinoids; steroids; Kojic Acid. - Rx Azelaic Acid is also a treatment option that is safe for pregnancy (Category B). - OTC Heliocare can be helpful in control and prevention. - Oral Rx with Tranexamic Acid 250 mg - 650 mg po daily can be used for moderate to severe cases especially during summer (contraindications include pregnancy; lactation; hx of PE; DVT; clotting disorder; heart disease; anticoagulant use and upcoming long trips)   - Chemical peels (would need multiple for best result).  - Lasers and  Microdermabrasion may also be helpful adjunct treatments.  Will prescribe [facility] Hydroquinone 12%/kojic acid/vitamin C cream pea sized amount twice daily to the entire face for up to 3 months. This cannot be used more than 3 months due to risk of exogenous ochronosis (permanent dark spots). The patient was advised this is not covered by insurance since it is made by a compounding pharmacy. They will receive an email to check out and the medication will be mailed to their home.    If not improving will switch to [facility] Hydroquinone 8%, Tretinoin 0.1%, Kojic acid 1%, Niacinamide 4%, Fluocinolone 0.025% cream, a pea sized amount nightly to dark  spots on face for up to 2 months. Hydroquinone from either cream cannot be used more than 3 months total due to risk of skin atrophy (thinning) and exogenous ochronosis (permanent dark spots). The patient was advised this is not covered by insurance. They will receive an email to check out and the  medication will be mailed to their home.   Plantar wart right great toe plantar surface x1  Viral Wart (HPV) Counseling  Discussed viral / HPV (Human Papilloma Virus) etiology and risk of spread /infectivity to other areas of body as well as to other people.  Multiple treatments and methods may be required to clear warts and it is possible treatment may not be successful.  Treatment risks include discoloration; scarring and there is still potential for wart recurrence.  Cantharidin Plus is a blistering agent that comes from a beetle.  It needs to be washed off in about 4 hours after application.  Although it is painless when applied in office, it may cause symptoms of mild pain and burning several hours later.  Treated areas will swell and turn red, and blisters may form.  Vaseline and a bandaid may be applied until wound has healed.  Once healed, the skin may remain temporarily discolored.  It can take weeks to months for pigmentation to return to normal.  Advised to wash off with soap and water in 4 hours or sooner if it becomes tender before then.   Destruction of lesion - right great toe plantar surface x1  Destruction method: cryotherapy   Informed consent: discussed and consent obtained   Lesion destroyed using liquid nitrogen: Yes   Region frozen until ice ball extended beyond lesion: Yes   Outcome: patient tolerated procedure well with no complications   Post-procedure details: wound care instructions given   Additional details:  Prior to procedure, discussed risks of blister formation, small wound, skin dyspigmentation, or rare scar following cryotherapy. Recommend Vaseline ointment to treated areas while healing.   Destruction of lesion - right great toe plantar surface x1  Destruction method: chemical removal   Informed consent: discussed and consent obtained   Timeout:  patient name, date of birth, surgical site, and procedure verified Chemical destruction method comment:   Cantharidin plus Procedure instructions: patient instructed to wash and dry area   Procedure instructions comment:  Wash off in 4 hours Outcome: patient tolerated procedure well with no complications   Post-procedure details: wound care instructions given   Additional details:  Squaric Acid 3% applied to warts today. Prior to application reviewed risk of inflammation and irritation.   Condyloma (6) right pubic x4, left pubic x1, suprapubic x1  Viral Wart (HPV) Counseling  Discussed viral / HPV (Human Papilloma Virus) etiology and risk of spread /infectivity to other areas of body as well as to other people.  Multiple treatments and methods may be required to clear warts and it is possible treatment may not be successful.  Treatment risks include discoloration; scarring and there is still potential for wart recurrence.   Destruction of lesion - right pubic x4, left pubic x1, suprapubic x1  Destruction method: cryotherapy   Informed consent: discussed and consent obtained   Lesion destroyed using liquid nitrogen: Yes   Region frozen until ice ball extended beyond lesion: Yes   Outcome: patient tolerated procedure well with no complications   Post-procedure details: wound care instructions given   Additional details:  Prior to procedure, discussed risks of blister formation, small wound, skin dyspigmentation, or rare  scar following cryotherapy. Recommend Vaseline ointment to treated areas while healing.    Return in about 1 month (around 05/12/2022) for Melasma Follow Up, Wart Follow UP.  I, Emelia Salisbury, CMA, am acting as scribe for Forest Gleason, MD.  Documentation: I have reviewed the above documentation for accuracy and completeness, and I agree with the above.  Forest Gleason, MD

## 2022-04-12 NOTE — Patient Instructions (Addendum)
Recommend daily mineral sunscreen (Zinc or Titanium) SPF 30+ to sun-exposed areas, reapply every 2 hours as needed. Call for new or changing lesions.  Staying in the shade or wearing long sleeves, sun glasses (UVA+UVB protection) and wide brim hats (4-inch brim around the entire circumference of the hat) are also recommended for sun protection.   Will prescribe [facility] Hydroquinone 12%/kojic acid/vitamin C cream pea sized amount twice daily to the entire face for up to 3 months. This cannot be used more than 3 months due to risk of exogenous ochronosis (permanent dark spots). The patient was advised this is not covered by insurance since it is made by a compounding pharmacy. They will receive an email to check out and the medication will be mailed to their home.   Instructions for [facility] Medications  One or more of your medications was sent to the [facility] mail order compounding pharmacy. You will receive an email from them and can purchase the medicine through that link. It will then be mailed to your home at the address you confirmed. If for any reason you do not receive an email from them, please check your spam folder. If you still do not find the email, please let us know. [facility] phone number is [phone].    Recommend taking Heliocare sun protection supplement daily in sunny weather for additional sun protection. For maximum protection on the sunniest days, you can take up to 2 capsules of regular Heliocare OR take 1 capsule of Heliocare Ultra. For prolonged exposure (such as a full day in the sun), you can repeat your dose of the supplement 4 hours after your first dose. Heliocare can be purchased at [employer], at some [facility] or at [email or web address].     Cryotherapy Aftercare  Wash gently with soap and water everyday.   Apply Vaseline daily until healed.     Instructions for After In-Office Application of Cantharidin  1. This is a strong  medicine; please follow ALL instructions.  2. Gently wash off with soap and water in four hours or sooner s directed by your physician.  3. **WARNING** this medicine can cause severe blistering, blood blisters, infection, and/or scarring if it is not washed off as directed.  4. Your progress will be rechecked in 1-2 months; call sooner if there are any questions or problems.   Cantharidin Plus is a blistering agent that comes from a beetle.  It needs to be washed off in about 4 hours after application.  Although it is painless when applied in office, it may cause symptoms of mild pain and burning several hours later.  Treated areas will swell and turn red, and blisters may form.  Vaseline and a bandaid may be applied until wound has healed.  Once healed, the skin may remain temporarily discolored.  It can take weeks to months for pigmentation to return to normal.  Advised to wash off with soap and water in 4 hours or sooner if it becomes tender before then.    Due to recent changes in healthcare laws, you may see results of your pathology and/or laboratory studies on MyChart before the doctors have had a chance to review them. We understand that in some cases there may be results that are confusing or concerning to you. Please understand that not all results are received at the same time and often the doctors may need to interpret multiple results in order to provide you with the best plan of care or  course of treatment. Therefore, we ask that you please give [place] 2 business days to thoroughly review all your results before contacting the office for clarification. Should we see a critical lab result, you will be contacted sooner.   If You Need Anything After Your Visit  If you have any questions or concerns for your doctor, please call our main line at [phone] and press option 4 to reach your doctor's medical assistant. If no one answers, please leave a voicemail as directed and we will return  your call as soon as possible. Messages left after 4 pm will be answered the following business day.   You may also send [place] a message via [place]. We typically respond to MyChart messages within 1-2 business days.  For prescription refills, please ask your pharmacy to contact our office. Our fax number is [phone].  If you have an urgent issue when the clinic is closed that cannot wait until the next business day, you can page your doctor at the number below.    Please note that while we do our best to be available for urgent issues outside of office hours, we are not available 24/7.   If you have an urgent issue and are unable to reach [place], you may choose to seek medical care at your doctor's office, retail clinic, urgent care center, or emergency room.  If you have a medical emergency, please immediately call 911 or go to the emergency department.  Pager Numbers  - Dr. Nehemiah Massed: [phone]  - Dr. Laurence Ferrari: [phone]  - Dr. Nicole Kindred: [phone]  In the event of inclement weather, please call our main line at [phone] for an update on the status of any delays or closures.  Dermatology Medication Tips: Please keep the boxes that topical medications come in in order to help keep track of the instructions about where and how to use these. Pharmacies typically print the medication instructions only on the boxes and not directly on the medication tubes.   If your medication is too expensive, please contact our office at [phone] option 4 or send [place] a message through [place].   We are unable to tell what your co-pay for medications will be in advance as this is different depending on your insurance coverage. However, we may be able to find a substitute medication at lower cost or fill out paperwork to get insurance to cover a needed medication.   If a prior authorization is required to get your medication covered by your insurance company, please allow [place] 1-2 business days to  complete this process.  Drug prices often vary depending on where the prescription is filled and some pharmacies may offer cheaper prices.  The website www.goodrx.com contains coupons for medications through different pharmacies. The prices here do not account for what the cost may be with help from insurance (it may be cheaper with your insurance), but the website can give you the price if you did not use any insurance.  - You can print the associated coupon and take it with your prescription to the pharmacy.  - You may also stop by our office during regular business hours and pick up a GoodRx coupon card.  - If you need your prescription sent electronically to a different pharmacy, notify our office through [facility] or by phone at [phone] option 4.     Si Usted Necesita Algo Despus de Su Visita  Tambin puede enviarnos un mensaje a travs de Pharmacist, community. Por lo general respondemos a  los mensajes de MyChart en el transcurso de 1 a 2 das hbiles.  Para renovar recetas, por favor pida a su farmacia que se ponga en contacto con nuestra oficina. Harland Dingwall de fax es [place] [phone].  Si tiene un asunto urgente cuando la clnica est cerrada y que no puede esperar hasta el siguiente da hbil, puede llamar/localizar a su doctor(a) al nmero que aparece a continuacin.   Por favor, tenga en cuenta que aunque hacemos todo lo posible para estar disponibles para asuntos urgentes fuera del horario de [place], no estamos disponibles las 24 horas del da, los 7 das de la [place].   Si tiene un problema urgente y no puede comunicarse con nosotros, puede optar por buscar atencin mdica  en el consultorio de su doctor(a), en una clnica privada, en un centro de atencin urgente o en una sala de emergencias.  Si tiene Engineering geologist, por favor llame inmediatamente al 911 o vaya a la sala de emergencias.  Nmeros de bper  - Dr. Nehemiah Massed: [phone]  - Dra. Moye:  [phone]  - Dra. Nicole Kindred: [phone]  En caso de inclemencias del [place], por favor llame a Johnsie Kindred principal al [phone] para una actualizacin sobre el [place] de cualquier retraso o cierre.  Consejos para la medicacin en dermatologa: Por favor, guarde las cajas en las que vienen los medicamentos de uso tpico para ayudarle a seguir las instrucciones sobre dnde y cmo usarlos. Las farmacias generalmente imprimen las instrucciones del medicamento slo en las cajas y no directamente en los tubos del [place].   Si su medicamento es muy caro, por favor, pngase en contacto con Zigmund Daniel llamando al [phone] y presione la opcin 4 o envenos un mensaje a travs de Pharmacist, community.   No podemos decirle cul ser su copago por los medicamentos por adelantado ya que esto es diferente dependiendo de la cobertura de su seguro. Sin embargo, es posible que podamos encontrar un medicamento sustituto a Electrical engineer un formulario para que el seguro cubra el medicamento que se considera necesario.   Si se requiere una autorizacin previa para que su compaa de seguros [place] su medicamento, por favor permtanos de 1 a 2 das hbiles para completar este proceso.  Los precios de los medicamentos varan con frecuencia dependiendo del Environmental consultant de dnde se surte la receta y alguna farmacias pueden ofrecer precios ms baratos.  El sitio web www.goodrx.com tiene cupones para medicamentos de Airline pilot. Los precios aqu no tienen en cuenta lo que podra costar con la ayuda del seguro (puede ser ms barato con su seguro), pero el sitio web puede darle el precio si no utiliz Research scientist (physical sciences).  - Puede imprimir el cupn correspondiente y llevarlo con su receta a la farmacia.  - Tambin puede pasar por nuestra oficina durante el horario de atencin regular y Charity fundraiser una tarjeta de cupones de GoodRx.  - Si necesita que su receta se enve electrnicamente a una farmacia diferente,  informe a nuestra oficina a travs de MyChart de [place] o por telfono llamando al [phone] y presione la opcin 4.

## 2022-04-19 ENCOUNTER — Encounter: Payer: Self-pay | Admitting: Dermatology

## 2022-04-25 ENCOUNTER — Ambulatory Visit: Payer: BC Managed Care – PPO | Admitting: Internal Medicine

## 2022-04-25 ENCOUNTER — Encounter: Payer: Self-pay | Admitting: Internal Medicine

## 2022-04-25 VITALS — BP 124/86 | HR 87 | Temp 96.8°F | Wt 204.0 lb

## 2022-04-25 DIAGNOSIS — J329 Chronic sinusitis, unspecified: Secondary | ICD-10-CM

## 2022-04-25 DIAGNOSIS — R051 Acute cough: Secondary | ICD-10-CM | POA: Diagnosis not present

## 2022-04-25 DIAGNOSIS — B9789 Other viral agents as the cause of diseases classified elsewhere: Secondary | ICD-10-CM | POA: Diagnosis not present

## 2022-04-25 LAB — POCT INFLUENZA A/B
Influenza A, POC: NEGATIVE
Influenza B, POC: NEGATIVE

## 2022-04-25 MED ORDER — HYDROCODONE BIT-HOMATROP MBR 5-1.5 MG/5ML PO SOLN: 5.0000 mL | Freq: Three times a day (TID) | ORAL | 0 refills | Status: DC | PRN

## 2022-04-25 MED ORDER — PREDNISONE 10 MG PO TABS: ORAL_TABLET | ORAL | 0 refills | Status: DC

## 2022-04-25 NOTE — Patient Instructions (Signed)
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
## 2022-04-27 ENCOUNTER — Other Ambulatory Visit: Payer: Self-pay | Admitting: Internal Medicine

## 2022-04-27 MED ORDER — AMOXICILLIN-POT CLAVULANATE 875-125 MG PO TABS: 1.0000 | ORAL_TABLET | Freq: Two times a day (BID) | ORAL | 0 refills | Status: DC

## 2022-05-03 ENCOUNTER — Other Ambulatory Visit: Payer: Self-pay | Admitting: Internal Medicine

## 2022-05-03 MED ORDER — HYDROCODONE BIT-HOMATROP MBR 5-1.5 MG/5ML PO SOLN: 5.0000 mL | Freq: Three times a day (TID) | ORAL | 0 refills | Status: DC | PRN

## 2022-05-06 ENCOUNTER — Other Ambulatory Visit: Payer: Self-pay | Admitting: Internal Medicine

## 2022-05-06 MED ORDER — HYDROCODONE BIT-HOMATROP MBR 5-1.5 MG/5ML PO SOLN: 5.0000 mL | Freq: Three times a day (TID) | ORAL | 0 refills | Status: DC | PRN

## 2022-05-08 NOTE — Progress Notes (Deleted)
   SUBJECTIVE:  No chief complaint on file.  HPI Patient presents to clinic to transfer care  No acute concerns.    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   Physical Exam  ASSESSMENT/PLAN:  There are no diagnoses linked to this encounter. PDMP reviewed***  No follow-ups on file.  Carollee Leitz, MD

## 2022-05-09 ENCOUNTER — Encounter: Payer: BC Managed Care – PPO | Admitting: Family Medicine

## 2022-05-11 ENCOUNTER — Ambulatory Visit: Payer: BC Managed Care – PPO | Admitting: Dermatology

## 2022-05-23 DIAGNOSIS — I209 Angina pectoris, unspecified: Secondary | ICD-10-CM

## 2022-05-23 HISTORY — DX: Angina pectoris, unspecified: I20.9

## 2022-05-25 ENCOUNTER — Ambulatory Visit
Admission: RE | Admit: 2022-05-25 | Discharge: 2022-05-25 | Disposition: A | Discharge status: Home / Self Care | Payer: BC Managed Care – PPO | Source: Ambulatory Visit | Attending: Internal Medicine | Admitting: Internal Medicine

## 2022-05-25 ENCOUNTER — Telehealth (INDEPENDENT_AMBULATORY_CARE_PROVIDER_SITE_OTHER): Payer: BC Managed Care – PPO | Admitting: Internal Medicine

## 2022-05-25 ENCOUNTER — Encounter: Payer: Self-pay | Admitting: Internal Medicine

## 2022-05-25 DIAGNOSIS — R0789 Other chest pain: Secondary | ICD-10-CM | POA: Diagnosis present

## 2022-05-25 DIAGNOSIS — R0602 Shortness of breath: Secondary | ICD-10-CM

## 2022-05-25 DIAGNOSIS — Z86711 Personal history of pulmonary embolism: Secondary | ICD-10-CM

## 2022-05-25 DIAGNOSIS — R42 Dizziness and giddiness: Secondary | ICD-10-CM | POA: Diagnosis present

## 2022-05-25 MED ORDER — IOHEXOL 350 MG/ML SOLN
75.0000 mL | Freq: Once | INTRAVENOUS | Status: AC | PRN
  Administered 2022-05-25: 75 mL via INTRAVENOUS
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
## 2022-05-25 NOTE — Patient Instructions (Signed)
Shortness of Breath, Adult Shortness of breath means you have trouble breathing. Shortness of breath could be a sign of a medical problem. Follow these instructions at home:  Pollution Do not smoke or use any products that contain nicotine or tobacco. If you need help quitting, ask your doctor. Avoid things that can make it harder to breathe, such as: Smoke of all kinds. This includes smoke from campfires or forest fires. Do not smoke or allow others to smoke in your home. Mold. Dust. Air pollution. Chemical smells. Things that can give you an allergic reaction (allergens) if you have allergies. Keep your living space clean. Use products that help remove mold and dust. General instructions Watch for any changes in your symptoms. Take over-the-counter and prescription medicines only as told by your doctor. This includes oxygen therapy and inhaled medicines. Rest as needed. Return to your normal activities when your doctor says that it is safe. Keep all follow-up visits. Contact a doctor if: Your condition does not get better as soon as expected. You have a hard time doing your normal activities, even after you rest. You have new symptoms. You cannot walk up stairs. You cannot exercise the way you normally do. Get help right away if: Your shortness of breath gets worse. You have trouble breathing when you are resting. You feel light-headed or you faint. You have a cough that is not helped by medicines. You cough up blood. You have pain with breathing. You have pain in your chest, arms, shoulders, or belly (abdomen). You have a fever. These symptoms may be an emergency. Get help right away. Call 911. Do not wait to see if the symptoms will go away. Do not drive yourself to the hospital. Summary Shortness of breath is when you have trouble breathing enough air. It can be a sign of a medical problem. Avoid things that make it hard for you to breathe, such as smoking, pollution,  mold, and dust. Watch for any changes in your symptoms. Contact your doctor if you do not get better or you get worse. This information is not intended to replace advice given to you by your health care provider. Make sure you discuss any questions you have with your health care provider. Document Revised: 12/26/2020 Document Reviewed: 12/26/2020 Elsevier Patient Education  2023 Elsevier Inc.  

## 2022-05-27 ENCOUNTER — Other Ambulatory Visit: Payer: Self-pay

## 2022-05-27 DIAGNOSIS — Z8616 Personal history of COVID-19: Secondary | ICD-10-CM | POA: Diagnosis not present

## 2022-05-27 DIAGNOSIS — R55 Syncope and collapse: Principal | ICD-10-CM | POA: Insufficient documentation

## 2022-05-27 DIAGNOSIS — R079 Chest pain, unspecified: Secondary | ICD-10-CM | POA: Insufficient documentation

## 2022-05-27 DIAGNOSIS — Z86718 Personal history of other venous thrombosis and embolism: Secondary | ICD-10-CM | POA: Insufficient documentation

## 2022-05-27 DIAGNOSIS — Z1152 Encounter for screening for COVID-19: Secondary | ICD-10-CM | POA: Insufficient documentation

## 2022-05-27 NOTE — ED Triage Notes (Signed)
Pt to ED via [facility] with c/o cough, CP, and dizziness. Pt with cold/cough symptoms x several weeks, today had chest discomfort that radiates to L arm with dizziness.   130/105 97% RA  Per EMS NSR, pt also c/o feeling SOB, also has dry cough. EMS reports pt has had cardiac workup with Dr. Fletcher Anon in the past, also been dx with blood clots in the past.

## 2022-05-28 ENCOUNTER — Emergency Department: Payer: BC Managed Care – PPO

## 2022-05-28 ENCOUNTER — Observation Stay
Admission: EM | Admit: 2022-05-28 | Discharge: 2022-05-28 | Disposition: A | Discharge status: Home / Self Care | Payer: BC Managed Care – PPO | Attending: Family Medicine | Admitting: Family Medicine

## 2022-05-28 DIAGNOSIS — F419 Anxiety disorder, unspecified: Secondary | ICD-10-CM | POA: Diagnosis not present

## 2022-05-28 DIAGNOSIS — R55 Syncope and collapse: Secondary | ICD-10-CM | POA: Diagnosis not present

## 2022-05-28 DIAGNOSIS — Z86711 Personal history of pulmonary embolism: Secondary | ICD-10-CM | POA: Diagnosis present

## 2022-05-28 DIAGNOSIS — F32A Depression, unspecified: Secondary | ICD-10-CM | POA: Diagnosis present

## 2022-05-28 DIAGNOSIS — F410 Panic disorder [episodic paroxysmal anxiety] without agoraphobia: Secondary | ICD-10-CM | POA: Diagnosis present

## 2022-05-28 DIAGNOSIS — R42 Dizziness and giddiness: Secondary | ICD-10-CM | POA: Diagnosis not present

## 2022-05-28 DIAGNOSIS — Z86718 Personal history of other venous thrombosis and embolism: Secondary | ICD-10-CM

## 2022-05-28 DIAGNOSIS — R079 Chest pain, unspecified: Secondary | ICD-10-CM

## 2022-05-28 LAB — RESP PANEL BY RT-PCR (RSV, FLU A&B, COVID)  RVPGX2
Influenza A by PCR: NEGATIVE
Influenza B by PCR: NEGATIVE
Resp Syncytial Virus by PCR: NEGATIVE
SARS Coronavirus 2 by RT PCR: NEGATIVE

## 2022-05-28 LAB — BASIC METABOLIC PANEL
Anion gap: 11 (ref 5–15)
BUN: 15 mg/dL (ref 6–20)
CO2: 22 mmol/L (ref 22–32)
Calcium: 9 mg/dL (ref 8.9–10.3)
Chloride: 106 mmol/L (ref 98–111)
Creatinine, Ser: 0.94 mg/dL (ref 0.61–1.24)
GFR, Estimated: >60 mL/min (ref >60)
Glucose, Bld: 101 mg/dL — ABNORMAL HIGH (ref 70–99)
Potassium: 3.5 mmol/L (ref 3.5–5.1)
Sodium: 139 mmol/L (ref 135–145)

## 2022-05-28 LAB — CBC
HCT: 44.2 % (ref 39.0–52.0)
Hemoglobin: 14.8 g/dL (ref 13.0–17.0)
MCH: 30.5 pg (ref 26.0–34.0)
MCHC: 33.5 g/dL (ref 30.0–36.0)
MCV: 90.9 fL (ref 80.0–100.0)
Platelets: 270 10*3/uL (ref 150–400)
RBC: 4.86 MIL/uL (ref 4.22–5.81)
RDW: 12.7 % (ref 11.5–15.5)
WBC: 5.1 10*3/uL (ref 4.0–10.5)
nRBC: 0 % (ref 0.0–0.2)

## 2022-05-28 LAB — TROPONIN I (HIGH SENSITIVITY)
Troponin I (High Sensitivity): 3 ng/L (ref <18)
Troponin I (High Sensitivity): 4 ng/L (ref <18)

## 2022-05-28 MED ORDER — SODIUM CHLORIDE 0.9 % IV BOLUS (SEPSIS)
1000.0000 mL | Freq: Once | INTRAVENOUS | Status: AC
  Administered 2022-05-28: 1000 mL via INTRAVENOUS

## 2022-05-28 NOTE — ED Notes (Signed)
Spoke with hospitalist. Pt will be discharged today once AVS is prepared.

## 2022-05-28 NOTE — ED Notes (Signed)
Pt removed monitoring leads. Wife at bedside. Pt complaining of anxiety.

## 2022-05-28 NOTE — Discharge Summary (Signed)
Physician Discharge Summary   Patient: Sergio Garcia MRN: [id number] DOB: 1976-01-01  Admit date:     05/28/2022  Discharge date: 05/28/22  Discharge Physician: Clarnce Flock   PCP: Jearld Fenton, NP   Recommendations at discharge:    Consider pursuing outpatient cardiac monitor such as a Zio patch and a repeat transthoracic echocardiogram Patient referred to Neurology for persistent dizziness, consider vestibular PT as well if symptoms are persistent Consider more intensive treatment for anxiety  Discharge Diagnoses: Principal Problem:   Syncope Active Problems:   Chest pain   Anxiety and depression   Panic attacks   History of DVT (deep vein thrombosis)   History of pulmonary embolism   Dizziness  Resolved Problems:   * No resolved hospital problems. [facility] Hospital Course: Patient evaluated for acute life threatening causes of his symptoms with reassuring workup. Patient evaluated and deemed stable for ongoing outpatient evaluation of his symptoms, see below.   Assessment and Plan: * Syncope Based on his history suspect patient had a vasovagal episode of syncope with hyperventilation and likely panic attack.  Workup to date has been completely unremarkable.  Serial troponins over 6 hours were both normal.  EKG did not demonstrate any ischemic changes and was unchanged from prior.  Chest x-ray was unremarkable.  In addition patient had CTPA 3 days prior for the same symptoms with no evidence of pulmonary embolism.  Discussed with patient at this point that I feel that multiple life-threatening causes of his symptoms have been ruled out, including pulmonary embolism, cardiac, cardiothoracic infection.  I do not think it unreasonable to continue performing a workup but do not feel this needs to occur in an inpatient setting at this time. - Discharged to home - Will message PCP to order outpatient cardiac monitor and repeat echo as he has not had one for about 2-1/2  years  Dizziness Low suspicion for cardiopulmonary etiology at this time.  Suspect he may have suffered a vestibulitis during his recent [place] infection, he excepted an outpatient referral to neurology to further evaluate this and possible need for vestibular PT.  Chest pain Very low suspicion for cardiac or pulmonary etiology.  CTPA to 3 days prior with no evidence of emboli.  Serial troponins and EKG were unremarkable.  We did discuss the possibility of anxiety contributing to his symptoms but he felt that this was different.  Another possibility would be pleurisy related to his recent URI, which is consistent with his reported worsening with coughing or deep breathing, we discussed this may take time to resolve if that is the case.         Consultants: none Procedures performed: none  Disposition: Home Diet recommendation:  Regular diet DISCHARGE MEDICATION: Allergies as of 05/28/2022       Reactions   Morphine And Related Anaphylaxis   Testosterone    Dvt/PE   Morphine Nausea And Vomiting   Tdap [tetanus-diphth-acell Pertussis] Hives, Rash        Medication List     STOP taking these medications    HYDROcodone bit-homatropine 5-1.5 MG/5ML syrup Commonly known as: HYCODAN       TAKE these medications    esomeprazole 40 MG capsule Commonly known as: NEXIUM Take 1 capsule (40 mg total) by mouth 2 (two) times daily before a meal.   famotidine 20 MG tablet Commonly known as: PEPCID Take 2 tablets (40 mg total) by mouth 2 (two) times daily for 10 days.   traZODone  50 MG tablet Commonly known as: DESYREL Take 0.5-1 tablets (25-50 mg total) by mouth at bedtime as needed. for sleep   Viberzi 75 MG Tabs Generic drug: Eluxadoline PLEASE SEE ATTACHED FOR DETAILED DIRECTIONS        Follow-up Information     Jearld Fenton, NP. Schedule an appointment as soon as possible for a visit in 1 week(s).   Specialties: Internal Medicine, Emergency Medicine Contact  information: [place] [address] [phone]                Discharge Exam: Danley Danker Weights   05/27/22 2356  Weight: 90.7 kg   Physical Exam Constitutional:      General: He is not in acute distress.    Appearance: He is well-developed. He is not ill-appearing, toxic-appearing or diaphoretic.  HENT:     Head: Normocephalic and atraumatic.  Eyes:     Extraocular Movements: Extraocular movements intact.     Pupils: Pupils are equal, round, and reactive to light.  Cardiovascular:     Rate and Rhythm: Normal rate and regular rhythm.     Heart sounds: Normal heart sounds. No murmur heard. Pulmonary:     Effort: Pulmonary effort is normal. No respiratory distress.     Breath sounds: Normal breath sounds. No wheezing, rhonchi or rales.  Abdominal:     General: Bowel sounds are normal.     Palpations: Abdomen is soft.  Musculoskeletal:     Right lower leg: No edema.     Left lower leg: No edema.  Skin:    General: Skin is warm and dry.  Neurological:     General: No focal deficit present.     Mental Status: He is alert and oriented to person, place, and time.     Cranial Nerves: Cranial nerves 2-12 are intact. No cranial nerve deficit.     Motor: No weakness or pronator drift.     Coordination: Coordination is intact.  Psychiatric:        Mood and Affect: Mood is anxious.        Behavior: Behavior normal.   Condition at discharge: good  The results of significant diagnostics from this hospitalization (including imaging, microbiology, ancillary and laboratory) are listed below for reference.   Imaging Studies: DG Chest Port 1 View  Result Date: 05/28/2022 CLINICAL DATA:  Cough with chest pain and dizziness. EXAM: PORTABLE CHEST 1 VIEW COMPARISON:  June 06, 2021 FINDINGS: The heart size and mediastinal contours are within normal limits. Both lungs are clear. The visualized skeletal structures are unremarkable. IMPRESSION: No active disease. Electronically  Signed   By: Virgina Norfolk M.D.   On: 05/28/2022 00:28   CT Angio Chest W/Cm &/Or Wo Cm  Result Date: 05/25/2022 CLINICAL DATA:  Shortness of breath and chest pain over the last 3 weeks EXAM: CT ANGIOGRAPHY CHEST WITH CONTRAST TECHNIQUE: Multidetector CT imaging of the chest was performed using the standard protocol during bolus administration of intravenous contrast. Multiplanar CT image reconstructions and MIPs were obtained to evaluate the vascular anatomy. RADIATION DOSE REDUCTION: This exam was performed according to the departmental dose-optimization program which includes automated exposure control, adjustment of the mA and/or kV according to patient size and/or use of iterative reconstruction technique. CONTRAST:  72m OMNIPAQUE IOHEXOL 350 MG/ML SOLN COMPARISON:  11/20/2020 FINDINGS: Cardiovascular: No filling defect is identified in the pulmonary arterial tree to suggest pulmonary embolus. No acute systemic vascular abnormality is identified. Mediastinum/Nodes: Unremarkable Lungs/Pleura: Unremarkable Upper  Abdomen: Cholecystectomy. Musculoskeletal: Unremarkable Review of the MIP images confirms the above findings. IMPRESSION: 1. No filling defect is identified in the pulmonary arterial tree to suggest pulmonary embolus. Electronically Signed   By: Van Clines M.D.   On: 05/25/2022 13:22    Microbiology: Results for orders placed or performed during the hospital encounter of 05/28/22  Resp panel by RT-PCR (RSV, Flu A&B, Covid) Anterior Nasal Swab     Status: None   Collection Time: 05/28/22 12:04 AM   Specimen: Anterior Nasal Swab  Result Value Ref Range Status   SARS Coronavirus 2 by RT PCR NEGATIVE NEGATIVE Final    Comment: (NOTE) SARS-CoV-2 target nucleic acids are NOT DETECTED.  The SARS-CoV-2 RNA is generally detectable in upper respiratory specimens during the acute phase of infection. The lowest concentration of SARS-CoV-2 viral copies this assay can detect is 138  copies/mL. A negative result does not preclude SARS-Cov-2 infection and should not be used as the sole basis for treatment or other patient management decisions. A negative result may occur with  improper specimen collection/handling, submission of specimen other than nasopharyngeal swab, presence of viral mutation(s) within the areas targeted by this assay, and inadequate number of viral copies(<138 copies/mL). A negative result must be combined with clinical observations, patient history, and epidemiological information. The expected result is Negative.  Fact Sheet for Patients:  EntrepreneurPulse.com.au  Fact Sheet for Healthcare Providers:  IncredibleEmployment.be  This test is no t yet approved or cleared by the [place] FDA and  has been authorized for detection and/or diagnosis of SARS-CoV-2 by FDA under an Emergency Use Authorization (EUA). This EUA will remain  in effect (meaning this test can be used) for the duration of the COVID-19 declaration under Section 564(b)(1) of the Act, 21 U.S.C.section 360bbb-3(b)(1), unless the authorization is terminated  or revoked sooner.       Influenza A by PCR NEGATIVE NEGATIVE Final   Influenza B by PCR NEGATIVE NEGATIVE Final    Comment: (NOTE) The Xpert Xpress SARS-CoV-2/FLU/RSV plus assay is intended as an aid in the diagnosis of influenza from Nasopharyngeal swab specimens and should not be used as a sole basis for treatment. Nasal washings and aspirates are unacceptable for Xpert Xpress SARS-CoV-2/FLU/RSV testing.  Fact Sheet for Patients: EntrepreneurPulse.com.au  Fact Sheet for Healthcare Providers: IncredibleEmployment.be  This test is not yet approved or cleared by the [place] FDA and has been authorized for detection and/or diagnosis of SARS-CoV-2 by FDA under an Emergency Use Authorization (EUA). This EUA will remain in effect (meaning  this test can be used) for the duration of the COVID-19 declaration under Section 564(b)(1) of the Act, 21 U.S.C. section 360bbb-3(b)(1), unless the authorization is terminated or revoked.     Resp Syncytial Virus by PCR NEGATIVE NEGATIVE Final    Comment: (NOTE) Fact Sheet for Patients: EntrepreneurPulse.com.au  Fact Sheet for Healthcare Providers: IncredibleEmployment.be  This test is not yet approved or cleared by the [place] FDA and has been authorized for detection and/or diagnosis of SARS-CoV-2 by FDA under an Emergency Use Authorization (EUA). This EUA will remain in effect (meaning this test can be used) for the duration of the COVID-19 declaration under Section 564(b)(1) of the Act, 21 U.S.C. section 360bbb-3(b)(1), unless the authorization is terminated or revoked.  Performed at [facility], [address] [place], [place] [address]     Labs: CBC: Recent Labs  Lab 05/28/22 0004  WBC 5.1  HGB 14.8  HCT 44.2  MCV  90.9  PLT 672   Basic Metabolic Panel: Recent Labs  Lab 05/28/22 0004  NA 139  K 3.5  CL 106  CO2 22  GLUCOSE 101*  BUN 15  CREATININE 0.94  CALCIUM 9.0   Liver Function Tests: No results for input(s): "AST", "ALT", "ALKPHOS", "BILITOT", "PROT", "ALBUMIN" in the last 168 hours. CBG: No results for input(s): "GLUCAP" in the last 168 hours.  Discharge time spent: less than 30 minutes.  Signed: Clarnce Flock, MD [facility] 05/28/2022

## 2022-05-28 NOTE — Assessment & Plan Note (Signed)
Very low suspicion for cardiac or pulmonary etiology.  CTPA to 3 days prior with no evidence of emboli.  Serial troponins and EKG were unremarkable.  We did discuss the possibility of anxiety contributing to his symptoms but he felt that this was different.  Another possibility would be pleurisy related to his recent URI, which is consistent with his reported worsening with coughing or deep breathing, we discussed this may take time to resolve if that is the case.

## 2022-05-28 NOTE — ED Provider Notes (Signed)
[facility] Provider Note    Event Date/Time   First MD Initiated Contact with Patient 05/28/22 0532     (approximate)   History   Chest Pain   HPI  Sergio Garcia is a 47 y.o. male with history of previous PE no longer on anticoagulation who presents to the emergency department with feeling poorly for the past several days.  States he has felt very dizzy and started having chest tightness and shortness of breath.  States tonight he passed out while standing.  States he had been standing for several minutes before passing out.  He had chest pain and shortness of breath with this episode.  States he just had an outpatient CT scan of his chest 3 days ago that showed no PE.  No history of CAD.  No known history of cardiac arrhythmia.   History provided by patient and wife.    Past Medical History:  Diagnosis Date   Anxiety    COVID-19    07/04/20   Depression    Enteritis    Gastritis    GERD (gastroesophageal reflux disease)    Gilbert's syndrome    hyperbilirubinemia   IBS (irritable bowel syndrome)    Inguinal hernia    Insomnia    Pre-diabetes    Prostatitis 2008    Past Surgical History:  Procedure Laterality Date   BACK SURGERY     2 ruptured discs   CHOLECYSTECTOMY  01/31/2014   GB polyp, chronic cholecystitis    ESOPHAGOGASTRODUODENOSCOPY  09/2004   gastritis acute and duodenitis   ESOPHAGOGASTRODUODENOSCOPY  01-14-14   Dr Allen Norris   ESOPHAGOGASTRODUODENOSCOPY N/A 09/16/2021   Procedure: ESOPHAGOGASTRODUODENOSCOPY (EGD);  Surgeon: [name], MD;  Location: [facility] ENDOSCOPY;  Service: Endoscopy;  Laterality: N/A;   EXPLORATORY LAPAROTOMY  Jan 2015   Dr Burt Knack   LASIK     RIGHT/LEFT HEART CATH AND CORONARY ANGIOGRAPHY N/A 10/18/2019   Procedure: RIGHT/LEFT HEART CATH AND CORONARY ANGIOGRAPHY;  Surgeon: Wellington Hampshire, MD;  Location: [facility];  Service: Cardiovascular;  Laterality: N/A;    MEDICATIONS:  Prior to Admission  medications   Medication Sig Start Date End Date Taking? Authorizing Provider  esomeprazole (NEXIUM) 40 MG capsule Take 1 capsule (40 mg total) by mouth 2 (two) times daily before a meal. 07/29/21   [name], MD  famotidine (PEPCID) 20 MG tablet Take 2 tablets (40 mg total) by mouth 2 (two) times daily for 10 days. 06/06/21 06/16/21  Rada Hay, MD  HYDROcodone bit-homatropine (HYCODAN) 5-1.5 MG/5ML syrup Take 5 mLs by mouth every 8 (eight) hours as needed for cough. 05/06/22   Jearld Fenton, NP  traZODone (DESYREL) 50 MG tablet Take 0.5-1 tablets (25-50 mg total) by mouth at bedtime as needed. for sleep Patient not taking: Reported on 04/25/2022 11/19/20   Orlene Erm, MD    Physical Exam   Triage Vital Signs: ED Triage Vitals  Enc Vitals Group     BP 05/27/22 2352 (!) 124/91     Pulse Rate 05/27/22 2352 71     Resp 05/27/22 2352 16     Temp 05/27/22 2352 97.8 F (36.6 C)     Temp Source 05/27/22 2352 Oral     SpO2 05/27/22 2352 97 %     Weight 05/27/22 2356 200 lb (90.7 kg)     Height 05/27/22 2356 '6\' 2"'$  (1.88 m)     Head Circumference --  Peak Flow --      Pain Score 05/27/22 2355 6     Pain Loc --      Pain Edu? --      Excl. in [place]? --     Most recent vital signs: Vitals:   05/27/22 2352 05/28/22 0628  BP: (!) 124/91 (!) 133/94  Pulse: 71 73  Resp: 16 17  Temp: 97.8 F (36.6 C) 97.6 F (36.4 C)  SpO2: 97% 96%    CONSTITUTIONAL: Alert and oriented and responds appropriately to questions. Well-appearing; well-nourished HEAD: Normocephalic, atraumatic EYES: Conjunctivae clear, pupils appear equal, sclera nonicteric ENT: normal nose; moist mucous membranes NECK: Supple, normal ROM CARD: RRR; S1 and S2 appreciated; no murmurs, no clicks, no rubs, no gallops RESP: Normal chest excursion without splinting or tachypnea; breath sounds clear and equal bilaterally; no wheezes, no rhonchi, no rales, no hypoxia or respiratory distress, speaking full  sentences ABD/GI: Normal bowel sounds; non-distended; soft, non-tender, no rebound, no guarding, no peritoneal signs BACK: The back appears normal EXT: Normal ROM in all joints; no deformity noted, no edema; no cyanosis, no calf tenderness or calf swelling SKIN: Normal color for age and race; warm; no rash on exposed skin NEURO: Moves all extremities equally, normal speech PSYCH: The patient's mood and manner are appropriate.   ED Results / Procedures / Treatments   LABS: (all labs ordered are listed, but only abnormal results are displayed) Labs Reviewed  BASIC METABOLIC PANEL - Abnormal; Notable for the following components:      Result Value   Glucose, Bld 101 (*)    All other components within normal limits  RESP PANEL BY RT-PCR (RSV, FLU A&B, COVID)  [id number]  CBC  TROPONIN I (HIGH SENSITIVITY)  TROPONIN I (HIGH SENSITIVITY)     EKG:  EKG Interpretation  Date/Time:  Friday May 27 2022 23:53:40 EST Ventricular Rate:  64 PR Interval:  166 QRS Duration: 80 QT Interval:  378 QTC Calculation: 389 R Axis:   62 Text Interpretation: Normal sinus rhythm Normal ECG When compared with ECG of 06-Jun-2021 10:29, Vent. rate has decreased BY  31 BPM T wave inversion no longer evident in Anterolateral leads Confirmed by Pryor Curia [phone]) on 05/28/2022 5:32:19 AM         RADIOLOGY: My personal review and interpretation of imaging: Chest x-ray clear.  I have personally reviewed all radiology reports.   DG Chest Port 1 View  Result Date: 05/28/2022 CLINICAL DATA:  Cough with chest pain and dizziness. EXAM: PORTABLE CHEST 1 VIEW COMPARISON:  June 06, 2021 FINDINGS: The heart size and mediastinal contours are within normal limits. Both lungs are clear. The visualized skeletal structures are unremarkable. IMPRESSION: No active disease. Electronically Signed   By: Virgina Norfolk M.D.   On: 05/28/2022 00:28     PROCEDURES:  Critical Care performed: No      .1-3 Lead  EKG Interpretation  Performed by: Mistee Soliman, Delice Bison, DO Authorized by: Tayna Smethurst, Delice Bison, DO     Interpretation: normal     ECG rate:  73   ECG rate assessment: normal     Rhythm: sinus rhythm     Ectopy: none     Conduction: normal       IMPRESSION / MDM / ASSESSMENT AND PLAN / ED COURSE  I reviewed the triage vital signs and the nursing notes.    Patient here with complaints of chest pain, shortness of breath, dizziness and syncopal episode today.  The patient  is on the cardiac monitor to evaluate for evidence of arrhythmia and/or significant heart rate changes.   DIFFERENTIAL DIAGNOSIS (includes but not limited to):   ACS, PE, dissection, anemia, electrolyte derangement, arrhythmia, CHF and orthostasis   Patient's presentation is most consistent with acute presentation with potential threat to life or bodily function.   PLAN: Workup initiated from triage.  Normal hemoglobin, normal electrolytes and renal function.  First troponin negative.  Second pending.  COVID, flu and RSV negative.  Chest x-ray clear without cardiomegaly, edema or widened mediastinum.  EKG shows no ischemia or arrhythmia or interval abnormality.  Will check orthostats, placed on cardiac monitoring but I am concerned about the syncopal event with chest pain and shortness of breath.  It does not sound vasovagal or orthostatic in nature.  Will give IV fluids but I recommended admission for observation for cardiac monitoring, serial troponins and possible echocardiogram.  He does have a history of PE but just had a PE study 3 days ago that showed no acute PE.  I do not feel this needs to be repeated today.   MEDICATIONS GIVEN IN ED: Medications  sodium chloride 0.9 % bolus 1,000 mL (1,000 mLs Intravenous New Bag/Given 05/28/22 0636)     ED COURSE: Second troponin pending.   CONSULTS:  Consulted and discussed patient's case with hospitalist, Dr. Damita Dunnings.  I have recommended admission and consulting physician  agrees and will place admission orders.  Patient (and family if present) agree with this plan.   I reviewed all nursing notes, vitals, pertinent previous records.  All labs, EKGs, imaging ordered have been independently reviewed and interpreted by myself.    OUTSIDE RECORDS REVIEWED: Reviewed patient's last PCP note on 05/25/2022.       FINAL CLINICAL IMPRESSION(S) / ED DIAGNOSES   Final diagnoses:  Syncope, unspecified syncope type  Chest pain, unspecified type     Rx / DC Orders   ED Discharge Orders     None        Note:  This document was prepared using Dragon voice recognition software and may include unintentional dictation errors.   Braelin Brosch, Delice Bison, DO 05/28/22 [phone]

## 2022-05-28 NOTE — H&P (Signed)
History and Physical    Patient: Sergio Garcia VVO:[id number] DOB: 07/17/1975 DOA: 05/28/2022 DOS: the patient was seen and examined on 05/28/2022 PCP: Jearld Fenton, NP  Patient coming from: Home  Chief Complaint:  Chief Complaint  Patient presents with   Chest Pain   HPI: Sergio Garcia is a 47 y.o. male with medical history significant for history of DVT and PE, anxiety and depression, panic attacks, who presents after an episode of syncope with chest pain and dizziness.  Patient was seen 3 days ago by video visit on January 3 reporting dizziness, chest tightness, shortness of breath, and given his history of DVT and PE he was sent for outpatient CTPA.  This was normal with no evidence of PE.  Today patient reports issues began about a month ago when he had a sinus infection.  Starting with that infection he began to feel very dizzy, have a cough, and have chest tightness.  His ongoing symptoms are the reason for his visit with PCP a few days ago.  Since his CTPA that was normal he reports that he has been feeling the same symptoms but last night they got acutely worse.  His symptoms are a mix of chest tightness, shortness of breath, and dizziness.  He reports the dizziness is primarily the worst symptom out of all 3.  He was in his bathroom and suddenly started to become very dizzy, leaned on a door jam for balance, but felt like the whole room was spinning spinning spinning and so he lowered himself to the ground.  He then reports he lost consciousness briefly and woke up on the ground on his side, denies any head trauma.  With regards to his chest tightness that comes and goes, has some associated pain radiating from his left breast to his left shoulder, worse with coughing and deep inspiration.  No associated diaphoresis or nausea.  In the ED vital signs showed normal oxygenation and respiratory rate, normal pulse, very mild hypertension.  CBC and BMP were unremarkable.  Serial troponins  over 6 hours were normal at 4 and 3.  Respiratory panel for influenza, RSV, COVID was negative.  EKG was obtained which showed sinus rhythm, normal rate, no signs of ischemic changes, unchanged from prior.  Chest x-ray was also obtained which was unremarkable.   Review of Systems: As mentioned in the history of present illness. All other systems reviewed and are negative. Past Medical History:  Diagnosis Date   Anxiety    COVID-19    07/04/20   Depression    Enteritis    Gastritis    GERD (gastroesophageal reflux disease)    Gilbert's syndrome    hyperbilirubinemia   IBS (irritable bowel syndrome)    Inguinal hernia    Insomnia    Pre-diabetes    Prostatitis 2008   Past Surgical History:  Procedure Laterality Date   BACK SURGERY     2 ruptured discs   CHOLECYSTECTOMY  01/31/2014   GB polyp, chronic cholecystitis    ESOPHAGOGASTRODUODENOSCOPY  09/2004   gastritis acute and duodenitis   ESOPHAGOGASTRODUODENOSCOPY  01-14-14   Dr Allen Norris   ESOPHAGOGASTRODUODENOSCOPY N/A 09/16/2021   Procedure: ESOPHAGOGASTRODUODENOSCOPY (EGD);  Surgeon: [name], MD;  Location: [facility] ENDOSCOPY;  Service: Endoscopy;  Laterality: N/A;   EXPLORATORY LAPAROTOMY  Jan 2015   Dr Burt Knack   LASIK     RIGHT/LEFT HEART CATH AND CORONARY ANGIOGRAPHY N/A 10/18/2019   Procedure: RIGHT/LEFT HEART CATH AND CORONARY ANGIOGRAPHY;  Surgeon:  Wellington Hampshire, MD;  Location: [facility];  Service: Cardiovascular;  Laterality: N/A;   Social History:  reports that he has never smoked. He has never used smokeless tobacco. He reports current alcohol use. He reports that he does not use drugs.  Allergies  Allergen Reactions   Morphine And Related Anaphylaxis   Testosterone     Dvt/PE   Morphine Nausea And Vomiting   Tdap [Tetanus-Diphth-Acell Pertussis] Hives and Rash    Family History  Problem Relation Age of Onset   Colon polyps Mother    Cancer Mother        sinus cavity, on XRT   Asthma Mother     Heart disease Father    Heart attack Father        x 2   Asthma Brother    Pancreatic cancer Maternal Grandfather    Colon cancer Maternal Grandfather    Heart Problems Paternal Grandfather    Cancer Maternal Uncle    Stroke Cousin        age 55/44   Diabetes Other        strong FH d both sides of family    Mental illness Neg Hx     Prior to Admission medications   Medication Sig Start Date End Date Taking? Authorizing Provider  esomeprazole (NEXIUM) 40 MG capsule Take 1 capsule (40 mg total) by mouth 2 (two) times daily before a meal. 07/29/21   [name], MD  famotidine (PEPCID) 20 MG tablet Take 2 tablets (40 mg total) by mouth 2 (two) times daily for 10 days. 06/06/21 06/16/21  Rada Hay, MD  HYDROcodone bit-homatropine (HYCODAN) 5-1.5 MG/5ML syrup Take 5 mLs by mouth every 8 (eight) hours as needed for cough. 05/06/22   Jearld Fenton, NP  traZODone (DESYREL) 50 MG tablet Take 0.5-1 tablets (25-50 mg total) by mouth at bedtime as needed. for sleep Patient not taking: Reported on 04/25/2022 11/19/20   Orlene Erm, MD  VIBERZI 75 MG TABS PLEASE SEE ATTACHED FOR DETAILED DIRECTIONS 02/18/22   [provider]    Physical Exam: Vitals:   05/27/22 2352 05/27/22 2356 05/28/22 0628  BP: (!) 124/91  (!) 133/94  Pulse: 71  73  Resp: 16  17  Temp: 97.8 F (36.6 C)  97.6 F (36.4 C)  TempSrc: Oral  Oral  SpO2: 97%  96%  Weight:  90.7 kg   Height:  '6\' 2"'$  (1.88 m)    Physical Exam Constitutional:      General: He is not in acute distress.    Appearance: He is well-developed. He is not ill-appearing, toxic-appearing or diaphoretic.  HENT:     Head: Normocephalic and atraumatic.  Eyes:     Extraocular Movements: Extraocular movements intact.     Pupils: Pupils are equal, round, and reactive to light.  Cardiovascular:     Rate and Rhythm: Normal rate and regular rhythm.     Heart sounds: Normal heart sounds. No murmur heard. Pulmonary:     Effort: Pulmonary  effort is normal. No respiratory distress.     Breath sounds: Normal breath sounds. No wheezing, rhonchi or rales.  Abdominal:     General: Bowel sounds are normal.     Palpations: Abdomen is soft.  Musculoskeletal:     Right lower leg: No edema.     Left lower leg: No edema.  Skin:    General: Skin is warm and dry.  Neurological:     General:  No focal deficit present.     Mental Status: He is alert and oriented to person, place, and time.     Cranial Nerves: Cranial nerves 2-12 are intact. No cranial nerve deficit.     Motor: No weakness or pronator drift.     Coordination: Coordination is intact.  Psychiatric:        Mood and Affect: Mood is anxious.        Behavior: Behavior normal.      Data Reviewed: Results for orders placed or performed during the hospital encounter of 05/28/22 (from the past 24 hour(s))  Basic metabolic panel     Status: Abnormal   Collection Time: 05/28/22 12:04 AM  Result Value Ref Range   Sodium 139 135 - 145 mmol/L   Potassium 3.5 3.5 - 5.1 mmol/L   Chloride 106 98 - 111 mmol/L   CO2 22 22 - 32 mmol/L   Glucose, Bld 101 (H) 70 - 99 mg/dL   BUN 15 6 - 20 mg/dL   Creatinine, Ser 0.94 0.61 - 1.24 mg/dL   Calcium 9.0 8.9 - 10.3 mg/dL   GFR, Estimated >60 >60 mL/min   Anion gap 11 5 - 15  CBC     Status: None   Collection Time: 05/28/22 12:04 AM  Result Value Ref Range   WBC 5.1 4.0 - 10.5 K/uL   RBC 4.86 4.22 - 5.81 MIL/uL   Hemoglobin 14.8 13.0 - 17.0 g/dL   HCT 44.2 39.0 - 52.0 %   MCV 90.9 80.0 - 100.0 fL   MCH 30.5 26.0 - 34.0 pg   MCHC 33.5 30.0 - 36.0 g/dL   RDW 12.7 11.5 - 15.5 %   Platelets 270 150 - 400 K/uL   nRBC 0.0 0.0 - 0.2 %  Troponin I (High Sensitivity)     Status: None   Collection Time: 05/28/22 12:04 AM  Result Value Ref Range   Troponin I (High Sensitivity) 4 <18 ng/L  Resp panel by RT-PCR (RSV, Flu A&B, Covid) Anterior Nasal Swab     Status: None   Collection Time: 05/28/22 12:04 AM   Specimen: Anterior Nasal Swab   Result Value Ref Range   SARS Coronavirus 2 by RT PCR NEGATIVE NEGATIVE   Influenza A by PCR NEGATIVE NEGATIVE   Influenza B by PCR NEGATIVE NEGATIVE   Resp Syncytial Virus by PCR NEGATIVE NEGATIVE  Troponin I (High Sensitivity)     Status: None   Collection Time: 05/28/22  6:37 AM  Result Value Ref Range   Troponin I (High Sensitivity) 3 <18 ng/L   DG Chest Port 1 View CLINICAL DATA:  Cough with chest pain and dizziness.  EXAM: PORTABLE CHEST 1 VIEW  COMPARISON:  June 06, 2021  FINDINGS: The heart size and mediastinal contours are within normal limits. Both lungs are clear. The visualized skeletal structures are unremarkable.  IMPRESSION: No active disease.  Electronically Signed   By: Virgina Norfolk M.D.   On: 05/28/2022 00:28  ECG (my read): Sinus rhythm, normal axis, normal rate, mildly slow R wave progression, no ischemic changes.  No changes from prior.  Chest X-ray (my read): Unremarkable chest x-ray with no acute findings   Assessment and Plan: Sergio Garcia is a 47 y.o. male with medical history significant for history of DVT and PE, anxiety and depression, panic attacks, who presents after an episode of syncope with chest pain and dizziness.   * Syncope Based on his history suspect patient had a  vasovagal episode of syncope with hyperventilation and likely panic attack.  Workup to date has been completely unremarkable.  Serial troponins over 6 hours were both normal.  EKG did not demonstrate any ischemic changes and was unchanged from prior.  Chest x-ray was unremarkable.  In addition patient had CTPA 3 days prior for the same symptoms with no evidence of pulmonary embolism.  Discussed with patient at this point that I feel that multiple life-threatening causes of his symptoms have been ruled out, including pulmonary embolism, cardiac, cardiothoracic infection.  I do not think it unreasonable to continue performing a workup but do not feel this needs to  occur in an inpatient setting at this time. - Discharged to home - Will message PCP to order outpatient cardiac monitor and repeat echo as he has not had one for about 2-1/2 years  Dizziness Low suspicion for cardiopulmonary etiology at this time.  Suspect he may have suffered a vestibulitis during his recent Pittore infection, he excepted an outpatient referral to neurology to further evaluate this and possible need for vestibular PT.  Chest pain Very low suspicion for cardiac or pulmonary etiology.  CTPA to 3 days prior with no evidence of emboli.  Serial troponins and EKG were unremarkable.  We did discuss the possibility of anxiety contributing to his symptoms but he felt that this was different.  Another possibility would be pleurisy related to his recent URI, which is consistent with his reported worsening with coughing or deep breathing, we discussed this may take time to resolve if that is the case.    Consults: None  Family Communication: Girlfriend updated at bedside  Disposition: Discharged to home in stable condition   Author: Clarnce Flock, MD 05/28/2022 1:14 PM  For on call review [email or web address].

## 2022-05-28 NOTE — Discharge Instructions (Signed)
You were seen in the ED for passing out, chest pain, and dizziness. At this point we have ruled out life threatening causes such as pulmonary embolism, heart attack, and pneumonia. Unfortunately it is still unclear why you are having these symptoms but there are several non-life threatening possibilities. For your chest pain, this may be due to a condition called pleurisy. This is an inflammation of the lining of the lungs that will hopefully get better as your upper respiratory infection improves. Your dizziness may also be related to that infection, and you may have something called vestibulitis. This can cause prolonged dizziness. I have sent a referral for a Neurologist to look into this further. Finally, I think it is reasonable to get a repeat ultrasound of your heart to see if there are any issues. I will message your primary care doctor to have her order and follow up on this test.

## 2022-05-28 NOTE — ED Notes (Addendum)
1208 Pt and wife walked out of ED. They walked out even though advised against and to wait for AVS. Steady gait noted. No IV in place.

## 2022-05-28 NOTE — Assessment & Plan Note (Signed)
Based on his history suspect patient had a vasovagal episode of syncope with hyperventilation and likely panic attack.  Workup to date has been completely unremarkable.  Serial troponins over 6 hours were both normal.  EKG did not demonstrate any ischemic changes and was unchanged from prior.  Chest x-ray was unremarkable.  In addition patient had CTPA 3 days prior for the same symptoms with no evidence of pulmonary embolism.  Discussed with patient at this point that I feel that multiple life-threatening causes of his symptoms have been ruled out, including pulmonary embolism, cardiac, cardiothoracic infection.  I do not think it unreasonable to continue performing a workup but do not feel this needs to occur in an inpatient setting at this time. - Discharged to home - Will message PCP to order outpatient cardiac monitor and repeat echo as he has not had one for about 2-1/2 years

## 2022-05-28 NOTE — Assessment & Plan Note (Signed)
Low suspicion for cardiopulmonary etiology at this time.  Suspect he may have suffered a vestibulitis during his recent Pittore infection, he excepted an outpatient referral to neurology to further evaluate this and possible need for vestibular PT.

## 2022-05-30 ENCOUNTER — Telehealth: Payer: Self-pay

## 2022-05-31 ENCOUNTER — Inpatient Hospital Stay: Payer: BC Managed Care – PPO | Admitting: Internal Medicine

## 2022-05-31 NOTE — Telephone Encounter (Signed)
Will discuss at upcoming appt today 

## 2022-05-31 NOTE — Telephone Encounter (Signed)
Transition Care Management Follow-up Telephone Call Date of discharge and from where: [place] 05/28/2022 How have you been since you were released from the hospital? better Any questions or concerns? No  Items Reviewed: Did the pt receive and understand the discharge instructions provided? Yes  Medications obtained and verified? Yes  Other? No  Any new allergies since your discharge? No  Dietary orders reviewed? Yes Do you have support at home? Yes   Home Care and Equipment/Supplies: Were home health services ordered? no If so, what is the name of the agency? N/a  Has the agency set up a time to come to the patient's home? not applicable Were any new equipment or medical supplies ordered?  No What is the name of the medical supply agency? N/a Were you able to get the supplies/equipment? not applicable Do you have any questions related to the use of the equipment or supplies? No  Functional Questionnaire: (I = Independent and D = Dependent) ADLs: I  Bathing/Dressing- I  Meal Prep- I  Eating- I  Maintaining continence- I  Transferring/Ambulation- I  Managing Meds- I  Follow up appointments reviewed:  PCP Hospital f/u appt confirmed? Yes  Scheduled to see Webb Silversmith on 05/31/2022 @ 3:00. [facility] f/u appt confirmed? No   Are transportation arrangements needed? No  If their condition worsens, is the pt aware to call PCP or go to the Emergency Dept.? Yes Was the patient provided with contact information for the PCP's office or ED? Yes Was to pt encouraged to call back with questions or concerns? Yes Juanda Crumble, LPN [place] Direct Dial [phone]
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
## 2022-06-09 ENCOUNTER — Inpatient Hospital Stay: Payer: BC Managed Care – PPO | Admitting: Internal Medicine

## 2022-06-14 ENCOUNTER — Ambulatory Visit: Payer: BC Managed Care – PPO | Admitting: Internal Medicine

## 2022-06-16 ENCOUNTER — Ambulatory Visit: Payer: BC Managed Care – PPO | Admitting: Internal Medicine

## 2022-06-16 ENCOUNTER — Encounter: Payer: Self-pay | Admitting: Internal Medicine

## 2022-06-16 VITALS — BP 102/80 | HR 101 | Temp 97.1°F | Wt 197.0 lb

## 2022-06-16 DIAGNOSIS — R42 Dizziness and giddiness: Secondary | ICD-10-CM

## 2022-06-16 DIAGNOSIS — F4310 Post-traumatic stress disorder, unspecified: Secondary | ICD-10-CM

## 2022-06-16 DIAGNOSIS — R55 Syncope and collapse: Secondary | ICD-10-CM | POA: Diagnosis not present

## 2022-06-16 DIAGNOSIS — F419 Anxiety disorder, unspecified: Secondary | ICD-10-CM

## 2022-06-16 DIAGNOSIS — F410 Panic disorder [episodic paroxysmal anxiety] without agoraphobia: Secondary | ICD-10-CM

## 2022-06-16 DIAGNOSIS — F5105 Insomnia due to other mental disorder: Secondary | ICD-10-CM

## 2022-06-16 DIAGNOSIS — R079 Chest pain, unspecified: Secondary | ICD-10-CM | POA: Diagnosis not present

## 2022-06-16 DIAGNOSIS — F32A Depression, unspecified: Secondary | ICD-10-CM

## 2022-06-16 DIAGNOSIS — R7303 Prediabetes: Secondary | ICD-10-CM

## 2022-06-16 MED ORDER — TRAZODONE HCL 50 MG PO TABS: 25.0000 mg | ORAL_TABLET | Freq: Every evening | ORAL | 1 refills | Status: DC | PRN

## 2022-06-16 MED ORDER — ESOMEPRAZOLE MAGNESIUM 40 MG PO CPDR: 40.0000 mg | DELAYED_RELEASE_CAPSULE | Freq: Two times a day (BID) | ORAL | 1 refills | Status: DC

## 2022-06-16 MED ORDER — BUDESONIDE 180 MCG/ACT IN AEPB: 2.0000 | INHALATION_SPRAY | Freq: Two times a day (BID) | RESPIRATORY_TRACT | 0 refills | Status: DC

## 2022-06-16 MED ORDER — BUPROPION HCL ER (XL) 150 MG PO TB24: 150.0000 mg | ORAL_TABLET | Freq: Every day | ORAL | 1 refills | Status: DC

## 2022-06-16 NOTE — Assessment & Plan Note (Signed)
Trazodone refilled today.  

## 2022-06-16 NOTE — Patient Instructions (Signed)
Syncope, Adult  Syncope is when you pass out or faint for a short time. It is caused by a sudden decrease in blood flow to the brain. This can happen for many reasons. It can sometimes happen when seeing blood, getting a shot (injection), or having pain or strong emotions. Most causes of fainting are not dangerous, but in some cases it can be a sign of a serious medical problem. If you faint, get help right away. Call your local emergency services (911 in the [place] Follow these instructions at home: Watch for any changes in your symptoms. Take these actions to stay safe and help with your symptoms: Knowing when you may be about to faint Signs that you may be about to faint include: Feeling dizzy or light-headed. It may feel like the room is spinning. Feeling weak. Feeling like you may vomit (nauseous). Seeing spots or seeing all white or all black. Having cold, clammy skin. Feeling warm and sweaty. Hearing ringing in the ears. If you start to feel like you might faint, sit or lie down right away. If sitting, lower your head down between your legs. If lying down, raise (elevate) your feet above the level of your heart. Breathe deeply and steadily. Wait until all of the symptoms are gone. Have someone stay with you until you feel better. Medicines Take over-the-counter and prescription medicines only as told by your doctor. If you are taking blood pressure or heart medicine, sit up and stand up slowly. Spend a few minutes getting ready to sit and then stand. This can help you feel less dizzy. Lifestyle Do not drive, use machinery, or play sports until your doctor says it is okay. Do not drink alcohol. Do not smoke or use any products that contain nicotine or tobacco. If you need help quitting, ask your doctor. Avoid hot tubs and saunas. General instructions Talk with your doctor about your symptoms. You may need to have testing to help find the cause. Drink enough fluid to keep your pee  (urine) pale yellow. Avoid standing for a long time. If you must stand for a long time, do movements such as: Moving your legs. Crossing your legs. Flexing and stretching your leg muscles. Squatting. Keep all follow-up visits. Contact a doctor if: You have episodes of near fainting. Get help right away if: You pass out or faint. You hit your head or are injured after fainting. You have any of these symptoms: Fast or uneven heartbeats (palpitations). Pain in your chest, belly, or back. Shortness of breath. You have jerky movements that you cannot control (seizure). You have a very bad headache. You are confused. You have problems with how you see (vision). You are very weak. You have trouble walking. You are bleeding from your mouth or your butt (rectum). You have black or tarry poop (stool). These symptoms may be an emergency. Get help right away. Call your local emergency services (911 in the [place] Do not wait to see if the symptoms will go away. Do not drive yourself to the hospital. Summary Syncope is when you pass out or faint for a short time. It is caused by a sudden decrease in blood flow to the brain. Signs that you may be about to faint include feeling dizzy or light-headed, feeling like you may vomit, seeing all white or all black, or having cold, clammy skin. If you start to feel like you might faint, sit or lie down right away. Lower your head if sitting, or raise (elevate)   your feet if lying down. Breathe deeply and steadily. Wait until all of the symptoms are gone. This information is not intended to replace advice given to you by your health care provider. Make sure you discuss any questions you have with your health care provider. Document Revised: 09/17/2020 Document Reviewed: 09/17/2020 Elsevier Patient Education  2023 Elsevier Inc.  

## 2022-06-16 NOTE — Assessment & Plan Note (Signed)
Will start Wellbutrin Support offered

## 2022-06-16 NOTE — Assessment & Plan Note (Signed)
Deteriorated Likely contributing to tension headaches We will try Wellbutrin Support offered
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
## 2022-06-17 LAB — HEMOGLOBIN A1C
Hgb A1c MFr Bld: 6.2 % of total Hgb — ABNORMAL HIGH (ref <5.7)
Mean Plasma Glucose: 131 mg/dL
eAG (mmol/L): 7.3 mmol/L

## 2022-06-17 LAB — HEPATIC FUNCTION PANEL
AG Ratio: 1.6 (calc) (ref 1.0–2.5)
ALT: 13 U/L (ref 9–46)
AST: 14 U/L (ref 10–40)
Albumin: 4.4 g/dL (ref 3.6–5.1)
Alkaline phosphatase (APISO): 58 U/L (ref 36–130)
Bilirubin, Direct: 0.3 mg/dL — ABNORMAL HIGH (ref 0.0–0.2)
Globulin: 2.7 g/dL (calc) (ref 1.9–3.7)
Indirect Bilirubin: 1.3 mg/dL (calc) — ABNORMAL HIGH (ref 0.2–1.2)
Total Bilirubin: 1.6 mg/dL — ABNORMAL HIGH (ref 0.2–1.2)
Total Protein: 7.1 g/dL (ref 6.1–8.1)

## 2022-06-17 LAB — TSH: TSH: 2.24 mIU/L (ref 0.40–4.50)

## 2022-08-12 ENCOUNTER — Encounter: Payer: Self-pay | Admitting: Internal Medicine
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
## 2022-08-16 IMAGING — US US EXTREM LOW VENOUS
1 series · 14 of 24 positions shown · non-contrast
Comparison: None.

CLINICAL DATA: Leg pain

EXAM:
BILATERAL LOWER EXTREMITY VENOUS DOPPLER ULTRASOUND
TECHNIQUE: Gray-scale sonography with compression, as well as color and duplex
ultrasound, were performed to evaluate the deep venous system(s)
from the level of the common femoral vein through the popliteal and
proximal calf veins.

[Series 1: us venous img lower bilat (dvt) · portal-venous · 80 acquisitions, 14 frames shown]
[im 1/80]
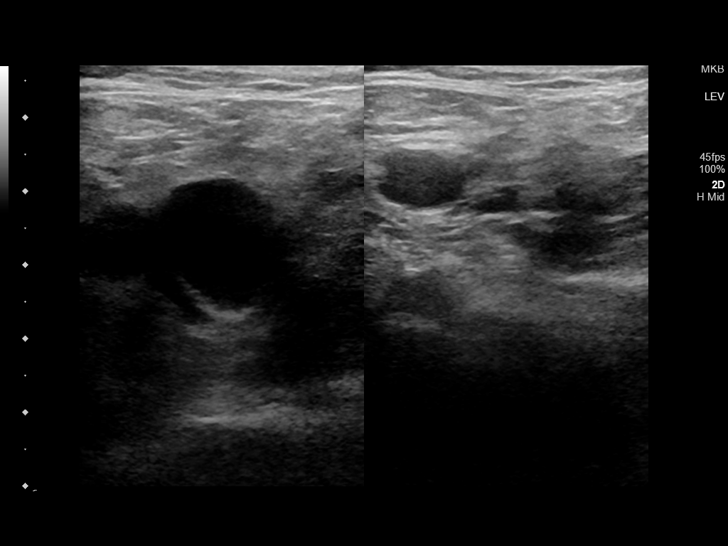
[im 7/80]
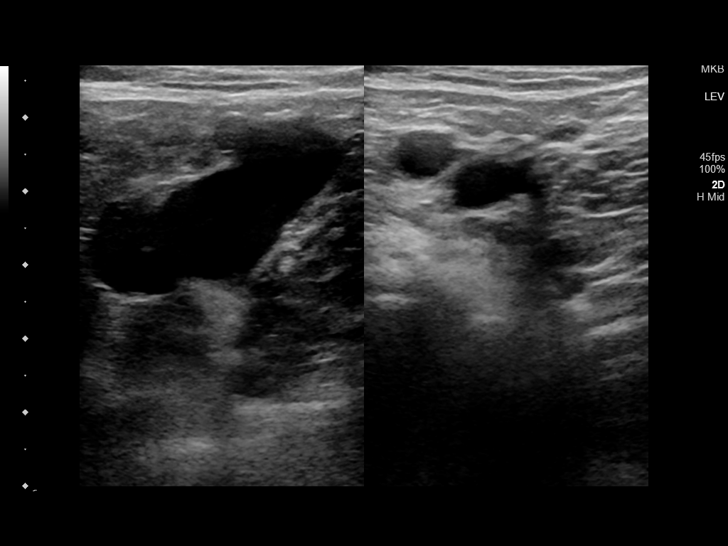
[im 14/80]
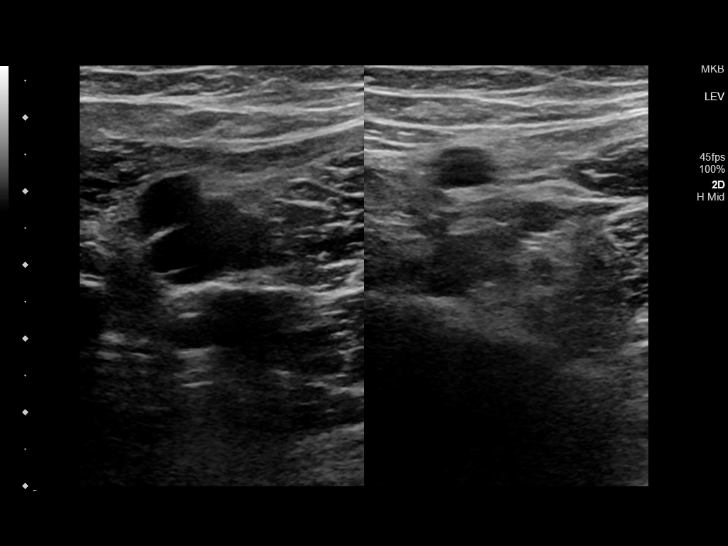
[im 21/80]
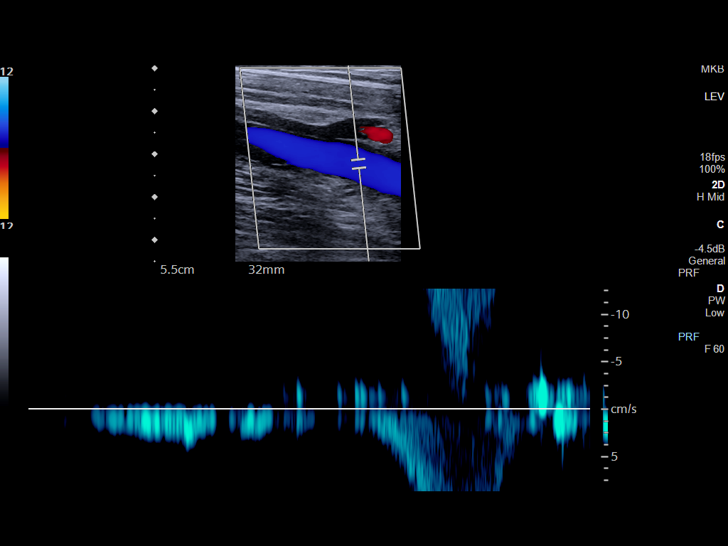
[im 25/80]
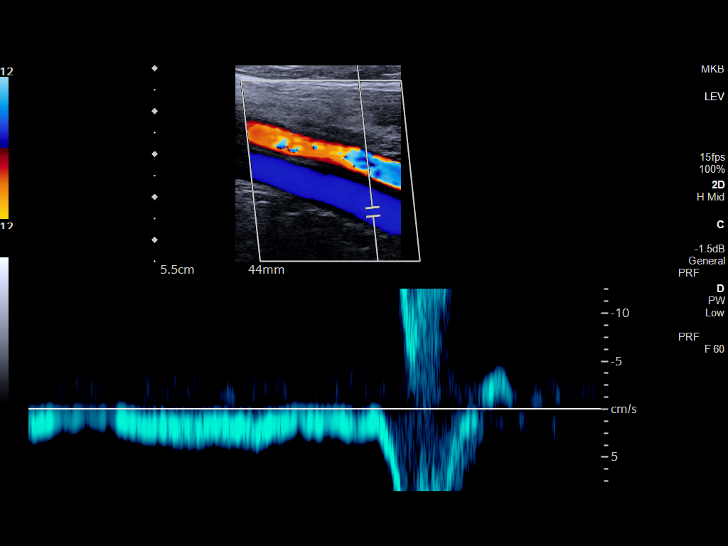
[im 31/80]
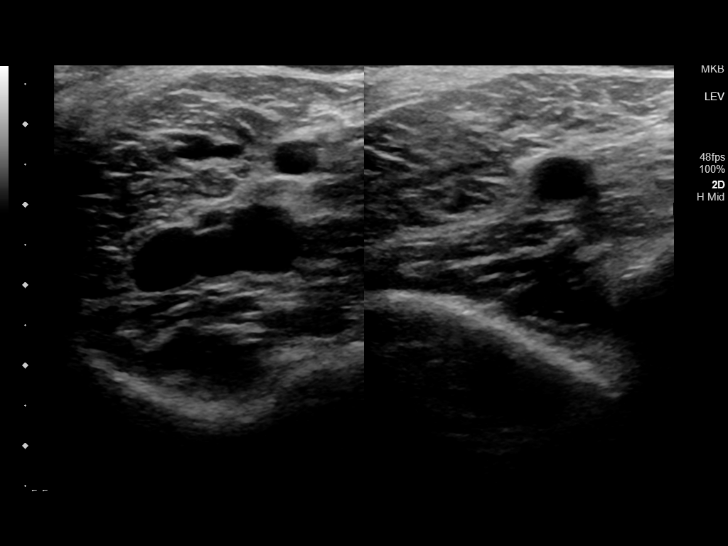
[im 38/80]
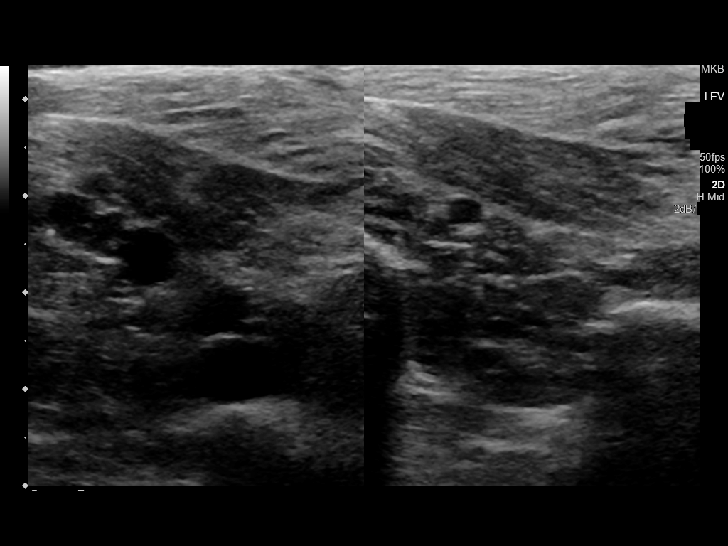
[im 42/80]
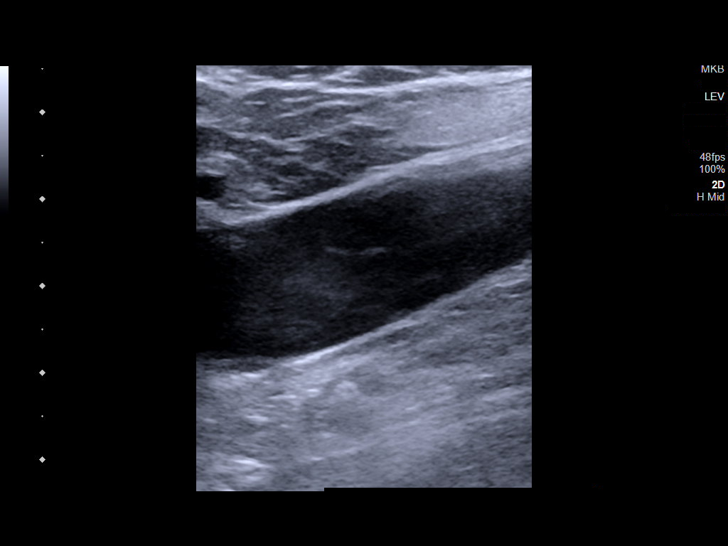
[im 49/80]
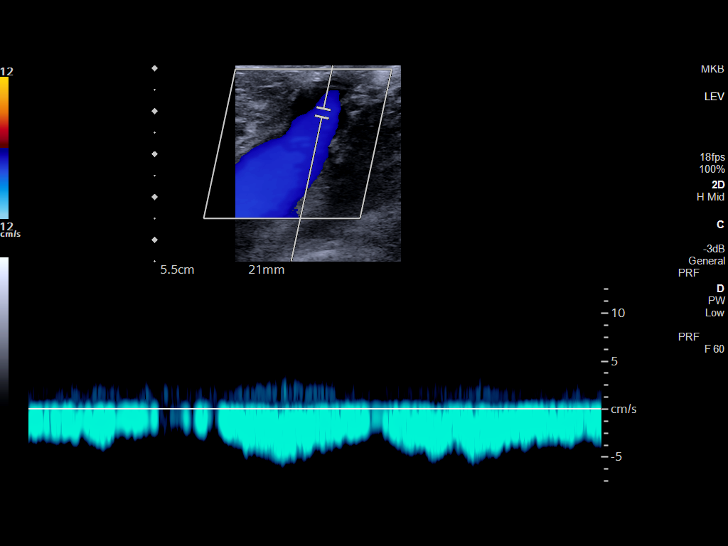
[im 55/80]
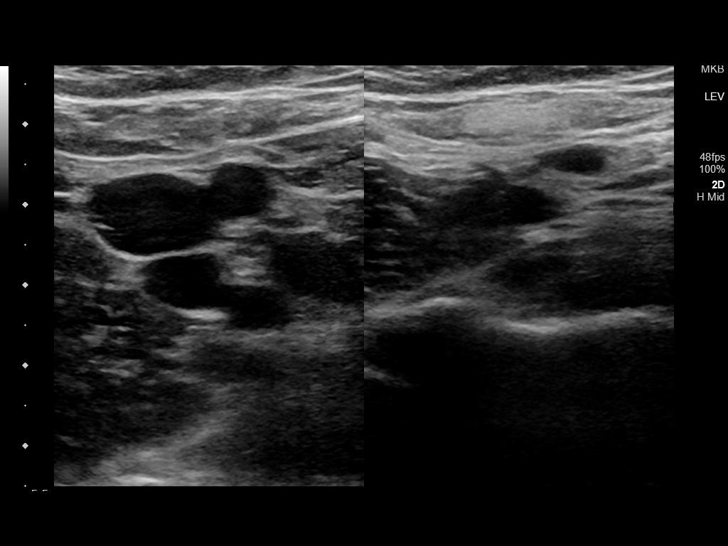
[im 62/80]
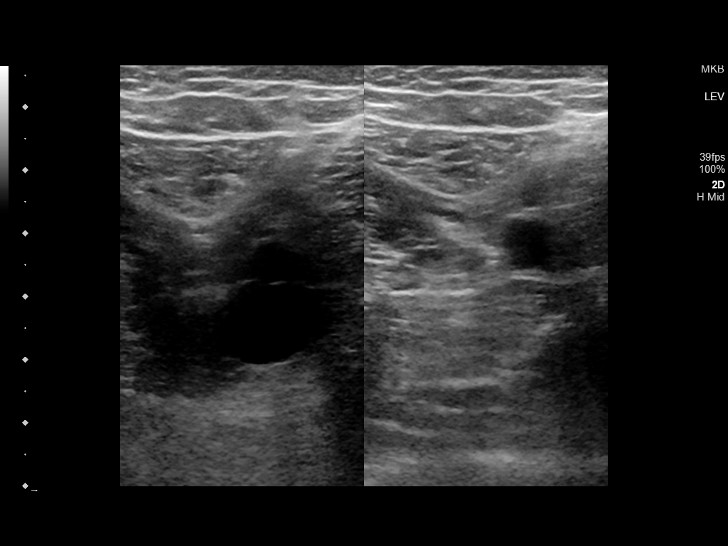
[im 66/80]
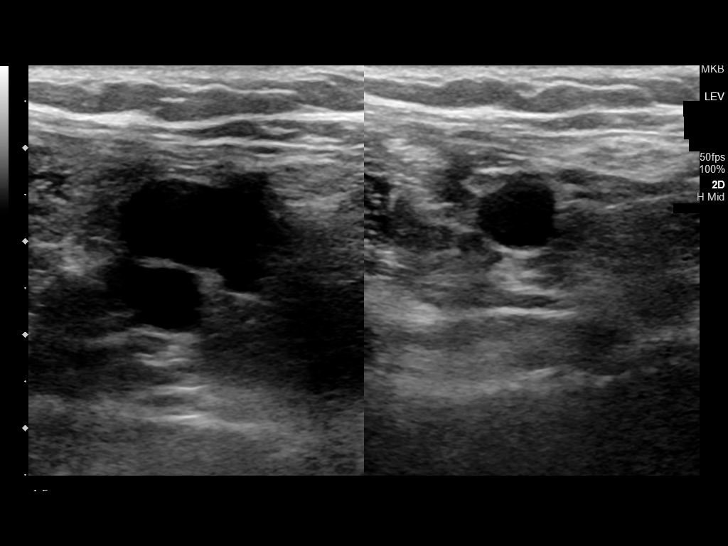
[im 73/80]
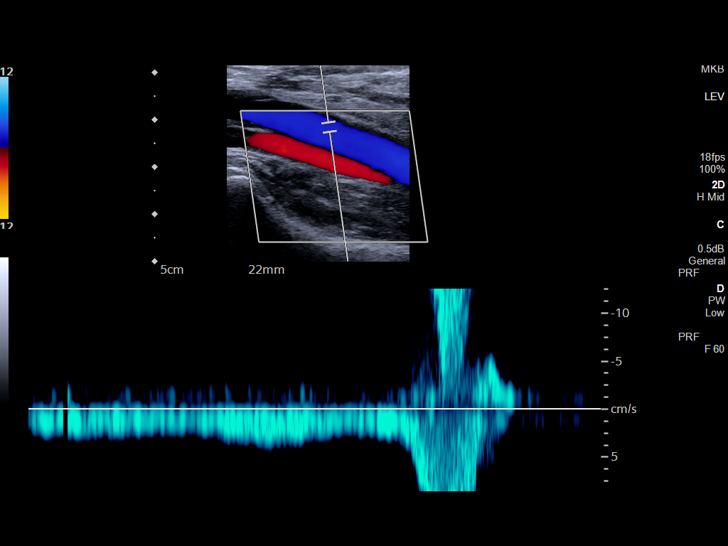
[im 80/80]
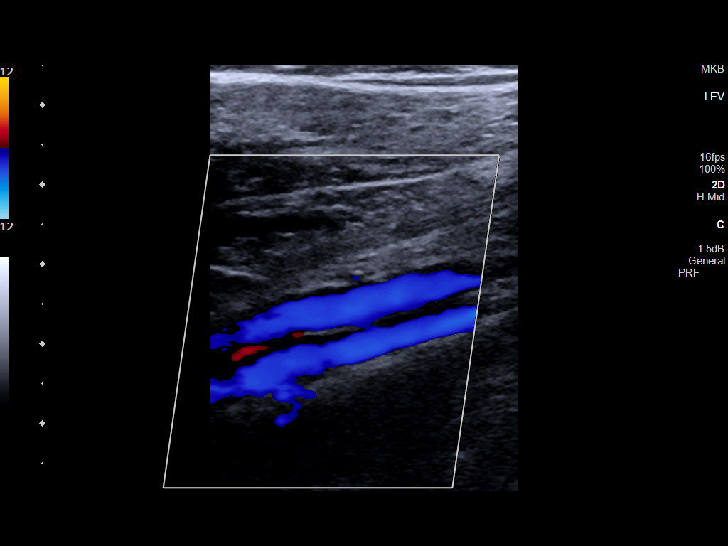

[14 of 24 positions shown; findings below may reference images not displayed]

FINDINGS: VENOUS

Normal compressibility of the common femoral, superficial femoral,
and popliteal veins, as well as the visualized calf veins.
Visualized portions of profunda femoral vein and great saphenous
vein unremarkable. No filling defects to suggest DVT on grayscale or
color Doppler imaging. Doppler waveforms show normal direction of
venous flow, normal respiratory plasticity and response to
augmentation.

OTHER

Rouleaux formation noted within the left common femoral vein which
can signal slow flow.

Limitations: none
IMPRESSION: No evidence of lower extremity DVT.

## 2022-11-18 ENCOUNTER — Other Ambulatory Visit: Payer: Self-pay | Admitting: Internal Medicine

## 2022-11-18 MED ORDER — VIBERZI 75 MG PO TABS: 75.0000 mg | ORAL_TABLET | Freq: Two times a day (BID) | ORAL | 0 refills | Status: AC

## 2022-12-29 ENCOUNTER — Ambulatory Visit
Admission: RE | Admit: 2022-12-29 | Discharge: 2022-12-29 | Disposition: A | Discharge status: Home / Self Care | Payer: BC Managed Care – PPO | Source: Ambulatory Visit | Attending: Medical | Admitting: Medical

## 2022-12-29 ENCOUNTER — Encounter: Payer: Self-pay | Admitting: Medical

## 2022-12-29 ENCOUNTER — Other Ambulatory Visit
Admission: RE | Admit: 2022-12-29 | Discharge: 2022-12-29 | Disposition: A | Discharge status: Home / Self Care | Payer: BC Managed Care – PPO | Source: Ambulatory Visit | Attending: Medical | Admitting: Medical

## 2022-12-29 ENCOUNTER — Ambulatory Visit: Payer: BC Managed Care – PPO | Attending: Medical | Admitting: Medical

## 2022-12-29 VITALS — BP 124/74 | HR 90 | Ht 74.0 in | Wt 195.2 lb

## 2022-12-29 DIAGNOSIS — R072 Precordial pain: Secondary | ICD-10-CM | POA: Insufficient documentation

## 2022-12-29 DIAGNOSIS — Z86711 Personal history of pulmonary embolism: Secondary | ICD-10-CM

## 2022-12-29 DIAGNOSIS — Z86718 Personal history of other venous thrombosis and embolism: Secondary | ICD-10-CM | POA: Diagnosis not present

## 2022-12-29 DIAGNOSIS — I471 Supraventricular tachycardia, unspecified: Secondary | ICD-10-CM

## 2022-12-29 LAB — BASIC METABOLIC PANEL
Anion gap: 9 (ref 5–15)
BUN: 19 mg/dL (ref 6–20)
CO2: 27 mmol/L (ref 22–32)
Calcium: 9.4 mg/dL (ref 8.9–10.3)
Chloride: 104 mmol/L (ref 98–111)
Creatinine, Ser: 1.12 mg/dL (ref 0.61–1.24)
GFR, Estimated: >60 mL/min (ref >60)
Glucose, Bld: 101 mg/dL — ABNORMAL HIGH (ref 70–99)
Potassium: 3.9 mmol/L (ref 3.5–5.1)
Sodium: 140 mmol/L (ref 135–145)

## 2022-12-29 LAB — D-DIMER, QUANTITATIVE: D-Dimer, Quant: <0.27 ug/mL-FEU (ref 0.00–0.50)

## 2022-12-29 LAB — CBC
HCT: 47 % (ref 39.0–52.0)
Hemoglobin: 15.8 g/dL (ref 13.0–17.0)
MCH: 30.8 pg (ref 26.0–34.0)
MCHC: 33.6 g/dL (ref 30.0–36.0)
MCV: 91.6 fL (ref 80.0–100.0)
Platelets: 277 10*3/uL (ref 150–400)
RBC: 5.13 MIL/uL (ref 4.22–5.81)
RDW: 12.8 % (ref 11.5–15.5)
WBC: 6.5 10*3/uL (ref 4.0–10.5)
nRBC: 0 % (ref 0.0–0.2)

## 2022-12-29 NOTE — Progress Notes (Signed)
Cardiology Office Note:    Date:  12/29/2022   ID:  MUSIQ VEST, DOB [birth year], MRN [id number]  PCP:  [name], NP  [facility] Cardiologist:  [name], MD  [facility] Electrophysiologist:  None   Referring MD: [name], NP   Chief Complaint: chest pain  History of Present Illness:    Sergio Garcia is a 47 y.o. male with a hx of normal coronary arteries by LHC on 10/18/19, paroxysmal SVT, recently diagnosed with DVT/PE in 05/2020, Gilbert's syndrome, IBS, GERD, strong family history of CAD, PTSD, anxiety who presents for follow-up.   Remote treadmill test in 2019 at [facility] for atypical chest pain showed excellent exercise capacity, with the patient exercising for 11 minutes, and was negative for ischemia.   He was evaluated by Dr. Kirke Corin as a new patient in 05/2018 for palpitations and shortness of breath with associated extreme fatigue and dizziness. At that time, he reported a syncopal episofe, without injury, approximately 3 weeks prior while walking in his house. Heart monitor showed NSR with an average heart rate of 75bpm, 1 run of SVT lasting 4 beats along with rare PACs and PVCs. Echo showed an LVEF 60-65%, normal LVSF, diastolic dysfunction, mildly dilated aortic root and ascending aorta, no valvular abnormalities.   Patient was seen in May 2021 reporting exertional chest pain.  Lexiscan showed no significant ischemia with T wave inversions noted on leads II, III, aVF, V5, and V6.  EF was 55 to 65%.  No significant coronary artery calcium noted on CT imaging.  Overall, this was a low risk study.  Patient was seen in follow-up in May 2021 with continued symptoms.  Right and left heart cath showed normal coronaries, normal LVEDP, right heart cath showed normal right and left filling pressures, normal pulmonary pressure, normal cardiac output.  He was seen in June 2021 reporting similar symptoms.  D-dimer was normal.  He underwent repeat echo which showed low  normal LVSF with an EF of 50 to 55%, no wall motion abnormalities, normal LV diastolic function, normal RV SF, trivial MR, and borderline dilation of the aortic root measuring 38 mm.  Repeat heart monitor showed predominantly normal sinus rhythm with an average heart rate of 76 bpm, 2 short runs of SVT with the longest lasting 8 beats with a rate of 144 bpm, rare PACs and PVCs.  CPX on 12/17/2019 showed his main functional limitation was due to deconditioning with no clear cardiopulmonary abnormality noted.  He was seen by his PCP in January 2022 reporting several day history of increasing shortness of breath and intermittent left-sided chest pain.  D-dimer was elevated and he was sent for CTA of the chest which showed subsegmental left-sided PE with evidence of right heart strain.  High-sensitivity troponin was negative x 2.  He was placed on IV heparin and transition to oral Eliquis prior to discharge.  Lower extremity ultrasound was positive for DVT in the left femoral vein. It was suspected DVT/PE or due to self initiating testosterone.  Today, the patient reports 2 weeks ago he had a sharp pain in the left side of the chest. It lasted about 5-10 minutes. It self resolved. It came back a couple days ago, but more dull. Worse when he takes a deep breath. It's not worse with exertion. He denies fever, chills, lower leg edema, orthopnea or pnd. Occasional nausea. No recent illness.  Patient denies leg pain.  Past Medical History:  Diagnosis Date  Anxiety    COVID-19    07/04/20   Depression    Enteritis    Gastritis    GERD (gastroesophageal reflux disease)    Gilbert's syndrome    hyperbilirubinemia   IBS (irritable bowel syndrome)    Inguinal hernia    Insomnia    Pre-diabetes    Prostatitis 2008    Past Surgical History:  Procedure Laterality Date   BACK SURGERY     2 ruptured discs   CHOLECYSTECTOMY  01/31/2014   GB polyp, chronic cholecystitis    ESOPHAGOGASTRODUODENOSCOPY  09/2004    gastritis acute and duodenitis   ESOPHAGOGASTRODUODENOSCOPY  01-14-14   Dr Servando Snare   ESOPHAGOGASTRODUODENOSCOPY N/A 09/16/2021   Procedure: ESOPHAGOGASTRODUODENOSCOPY (EGD);  Surgeon: Midge Minium, MD;  Location: [facility] ENDOSCOPY;  Service: Endoscopy;  Laterality: N/A;   EXPLORATORY LAPAROTOMY  Jan 2015   Dr Excell Seltzer   LASIK     RIGHT/LEFT HEART CATH AND CORONARY ANGIOGRAPHY N/A 10/18/2019   Procedure: RIGHT/LEFT HEART CATH AND CORONARY ANGIOGRAPHY;  Surgeon: Iran Ouch, MD;  Location: [facility] INVASIVE CV LAB;  Service: Cardiovascular;  Laterality: N/A;    Current Medications: Current Meds  Medication Sig   budesonide (PULMICORT) 180 MCG/ACT inhaler Inhale 2 puffs into the lungs in the morning and at bedtime.   buPROPion (WELLBUTRIN XL) 150 MG 24 hr tablet Take 1 tablet (150 mg total) by mouth daily.   esomeprazole (NEXIUM) 40 MG capsule Take 1 capsule (40 mg total) by mouth 2 (two) times daily before a meal.   traZODone (DESYREL) 50 MG tablet Take 0.5-1 tablets (25-50 mg total) by mouth at bedtime as needed. for sleep   VIBERZI 75 MG TABS Take 1 tablet (75 mg total) by mouth 2 (two) times daily.     Allergies:   Morphine and codeine, Testosterone, Morphine, and Tdap [tetanus-diphth-acell pertussis]   Social History   Socioeconomic History   Marital status: Divorced    Spouse name: Not on file   Number of children: 2   Years of education: Not on file   Highest education level: High school graduate  Occupational History   Occupation: Sports administrator: [employer]  Tobacco Use   Smoking status: Never   Smokeless tobacco: Never  Vaping Use   Vaping status: Never Used  Substance and Sexual Activity   Alcohol use: Yes    Comment: occassional social   Drug use: No   Sexual activity: Yes  Other Topics Concern   Not on file  Social History Narrative   Married.   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   Enjoys watching racing, Curator,  deer hunting.             Social Determinants of Health   Financial Resource Strain: Low Risk  (07/03/2020)   Overall Financial Resource Strain (CARDIA)    Difficulty of Paying Living Expenses: Not hard at all  Food Insecurity: No Food Insecurity (09/03/2018)   Hunger Vital Sign    Worried About Running Out of Food in the Last Year: Never true    Ran Out of Food in the Last Year: Never true  Transportation Needs: No Transportation Needs (09/03/2018)   PRAPARE - Administrator, Civil Service (Medical): No    Lack of Transportation (Non-Medical): No  Physical Activity: Inactive (09/03/2018)   Exercise Vital Sign    Days of Exercise per Week: 0 days  Minutes of Exercise per Session: 0 min  Stress: Stress Concern Present (09/03/2018)   [employer] of Occupational Health - Occupational Stress Questionnaire    Feeling of Stress : Very much  Social Connections: Unknown (09/03/2018)   Social Connection and Isolation Panel [NHANES]    Frequency of Communication with Friends and Family: Not on file    Frequency of Social Gatherings with Friends and Family: Not on file    Attends Religious Services: 1 to 4 times per year    Active Member of [employer] or Organizations: No    Attends Engineer, structural: Never    Marital Status: Separated     Family History: The patient's family history includes Asthma in his brother and mother; Cancer in his maternal uncle and mother; Colon cancer in his maternal grandfather; Colon polyps in his mother; Diabetes in an other family member; Heart Problems in his paternal grandfather; Heart attack in his father; Heart disease in his father; Pancreatic cancer in his maternal grandfather; Stroke in his cousin. There is no history of Mental illness.  ROS:   Please see the history of present illness.     All other systems reviewed and are negative.  EKGs/Labs/Other Studies Reviewed:    The following studies were reviewed today:  Echo  2021 1. Left ventricular ejection fraction, by estimation, is 50 to 55%. The  left ventricle has low normal function. The left ventricle has no regional  wall motion abnormalities. Left ventricular diastolic parameters were  normal.   2. Right ventricular systolic function is normal. The right ventricular  size is normal.   3. The mitral valve is normal in structure. Trivial mitral valve  regurgitation.   4. The aortic valve is tricuspid. Aortic valve regurgitation is not  visualized. No aortic stenosis is present.   5. Aortic dilatation noted. There is borderline dilatation of the aortic  root measuring 38 mm.   6. The inferior vena cava is normal in size with greater than 50%  respiratory variability, suggesting right atrial pressure of 3 mmHg.   Heart monitor 2021 6-day ZIO monitor: Normal sinus rhythm with an average heart rate of 76 bpm. 2 short runs of SVT the longest lasted only 8 beats with a rate of 144 bpm. Rare PACs and rare PVCs. Reported symptoms included shortness of breath, dizziness and lightheadedness.  He is noted to be in sinus rhythm during these reported symptoms. The 2 SVT episodes were not associated with symptoms.  R/L heart cath 09/2019 1.  Normal coronary arteries. 2.  Left ventricular angiography was not performed.  EF was normal by echo.  Normal left ventricular end-diastolic pressure 3.  Right heart catheterization showed normal right and left-sided filling pressures, normal pulmonary pressure and normal cardiac output.   Recommendations: Chest pain and shortness of breath seems to be not cardiac in origin based on normal right and left cardiac catheterization.   EKG:  EKG is  ordered today.  The ekg ordered today demonstrates NSR 90bpm, nonspecific T wave changes  Recent Labs: 05/28/2022: BUN 15; Creatinine, Ser 0.94; Hemoglobin 14.8; Platelets 270; Potassium 3.5; Sodium 139 06/16/2022: ALT 13; TSH 2.24  Recent Lipid Panel    Component Value Date/Time    CHOL 183 12/19/2019 0901   CHOL 173 01/24/2019 0817   TRIG 90.0 12/19/2019 0901   HDL 41.40 12/19/2019 0901   HDL 43 01/24/2019 0817   CHOLHDL 4 12/19/2019 0901   VLDL 18.0 12/19/2019 0901   LDLCALC 124 (H) 12/19/2019  0901   LDLCALC 108 (H) 01/24/2019 0817     Physical Exam:    VS:  BP 124/74 (BP Location: Left Arm, Patient Position: Sitting, Cuff Size: Normal)   Pulse 90   Ht 6\' 2"  (1.88 m)   Wt 195 lb 3.2 oz (88.5 kg)   SpO2 97%   BMI 25.06 kg/m     Wt Readings from Last 3 Encounters:  12/29/22 195 lb 3.2 oz (88.5 kg)  06/16/22 197 lb (89.4 kg)  05/27/22 200 lb (90.7 kg)     GEN:  Well nourished, well developed in no acute distress HEENT: Normal NECK: No JVD; No carotid bruits LYMPHATICS: No lymphadenopathy CARDIAC: RRR, no murmurs, rubs, gallops RESPIRATORY:  Clear to auscultation without rales, wheezing or rhonchi  ABDOMEN: Soft, non-tender, non-distended MUSCULOSKELETAL:  No edema; No deformity  SKIN: Warm and dry NEUROLOGIC:  Alert and oriented x 3 PSYCHIATRIC:  Normal affect   ASSESSMENT:    1. Precordial pain   2. History of DVT (deep vein thrombosis)   3. History of pulmonary embolism   4. PSVT (paroxysmal supraventricular tachycardia)    PLAN:    In order of problems listed above:  Precordial chest pain Patient reports sharp left sided chest pain that is worse with inspiration. He denies recent illness, fever or chills. Pain is not worse with exertion. It feels similar to prior PE. He also says he has chronic DOE that is unchanged since his prior PE in 2022. LHC in 2021 showed normal coronaries, low suspicion this is ACS, however cannot completely rule out. I will order an echocardiogram and a CXR. I will check STAT Ddimer, BMET and CBC.  Patient denies any lower leg pain.  H/o DVT/PE 2022 H/o DVT/PE in the setting of testosterone injection previously on a/c. Plan as above.  pSVT Not on standing BB. No further symptoms reported.   Disposition:  Follow up in 6-8 week(s) with MD/APP    Signed, Deniya Craigo David Stall, PA-C  12/29/2022 2:26 PM    [facility]

## 2022-12-29 NOTE — Patient Instructions (Signed)
Medication Instructions:  Your physician recommends that you continue on your current medications as directed. Please refer to the Current Medication list given to you today.  *If you need a refill on your cardiac medications before your next appointment, please call your pharmacy*   Lab Work:  This is a reminder that D-Dimer, CBC, BMET has ordered lab work for you.   Please go to the Medical mall You do not need an appointment.  They are open from 7am-6 pm.    If you have labs (blood work) drawn today and your tests are completely normal, you will receive your results only by: MyChart Message (if you have MyChart) OR A paper copy in the mail If you have any lab test that is abnormal or we need to change your treatment, we will call you to review the results.   Testing/Procedures:  1   Your provider has ordered a chest X-Ray for you. You can have this done at the [facility] medical mall. You do not need an appointment. Please go to the entrance of the Medical Mall and check in at the front desk.    2   Your physician has requested that you have an echocardiogram. Echocardiography is a painless test that uses sound waves to create images of your heart. It provides your doctor with information about the size and shape of your heart and how well your heart's chambers and valves are working.   You may receive an ultrasound enhancing agent through an IV if needed to better visualize your heart during the echo. This procedure takes approximately one hour.  There are no restrictions for this procedure.  This will take place at [address] [address], [place] [address]     Follow-Up: At [facility], you and your health needs are our priority.  As part of our continuing mission to provide you with exceptional heart care, we have created designated Provider Care Teams.  These Care Teams include your primary Cardiologist (physician) and Advanced Practice  Providers (APPs -  Physician Assistants and Nurse Practitioners) who all work together to provide you with the care you need, when you need it.  We recommend signing up for the patient portal called "MyChart".  Sign up information is provided on this After Visit Summary.  MyChart is used to connect with patients for Virtual Visits (Telemedicine).  Patients are able to view lab/test results, encounter notes, upcoming appointments, etc.  Non-urgent messages can be sent to your provider as well.   To learn more about what you can do with MyChart, go to [email or web address].    Your next appointment:   7 week(s)  Provider:   You may see [name], MD or one of the following Advanced Practice Providers on your designated Care Team:    Cadence [place], [place]

## 2023-01-13 ENCOUNTER — Other Ambulatory Visit: Payer: Self-pay | Admitting: Unknown Physician Specialty

## 2023-01-13 DIAGNOSIS — H9042 Sensorineural hearing loss, unilateral, left ear, with unrestricted hearing on the contralateral side: Secondary | ICD-10-CM

## 2023-01-17 ENCOUNTER — Other Ambulatory Visit: Payer: Self-pay | Admitting: Medical

## 2023-01-17 DIAGNOSIS — Z86718 Personal history of other venous thrombosis and embolism: Secondary | ICD-10-CM

## 2023-01-17 DIAGNOSIS — R072 Precordial pain: Secondary | ICD-10-CM

## 2023-01-17 DIAGNOSIS — Z86711 Personal history of pulmonary embolism: Secondary | ICD-10-CM

## 2023-01-17 DIAGNOSIS — I471 Supraventricular tachycardia, unspecified: Secondary | ICD-10-CM

## 2023-01-27 ENCOUNTER — Other Ambulatory Visit: Payer: BC Managed Care – PPO

## 2023-02-07 ENCOUNTER — Ambulatory Visit
Admission: RE | Admit: 2023-02-07 | Discharge: 2023-02-07 | Disposition: A | Discharge status: Home / Self Care | Payer: BC Managed Care – PPO | Source: Ambulatory Visit | Attending: Unknown Physician Specialty | Admitting: Unknown Physician Specialty

## 2023-02-07 DIAGNOSIS — H9042 Sensorineural hearing loss, unilateral, left ear, with unrestricted hearing on the contralateral side: Secondary | ICD-10-CM

## 2023-02-07 MED ORDER — GADOPICLENOL 0.5 MMOL/ML IV SOLN
10.0000 mL | Freq: Once | INTRAVENOUS | Status: AC | PRN
  Administered 2023-02-07: 9 mL via INTRAVENOUS

## 2023-02-08 ENCOUNTER — Ambulatory Visit: Payer: BC Managed Care – PPO | Admitting: Internal Medicine

## 2023-02-08 ENCOUNTER — Encounter: Payer: Self-pay | Admitting: Internal Medicine

## 2023-02-08 VITALS — BP 118/82 | HR 107 | Temp 97.1°F | Wt 188.0 lb

## 2023-02-08 DIAGNOSIS — F410 Panic disorder [episodic paroxysmal anxiety] without agoraphobia: Secondary | ICD-10-CM | POA: Diagnosis not present

## 2023-02-08 DIAGNOSIS — F419 Anxiety disorder, unspecified: Secondary | ICD-10-CM

## 2023-02-08 DIAGNOSIS — U071 COVID-19: Secondary | ICD-10-CM | POA: Diagnosis not present

## 2023-02-08 DIAGNOSIS — F32A Depression, unspecified: Secondary | ICD-10-CM

## 2023-02-08 LAB — POC COVID19 BINAXNOW: SARS Coronavirus 2 Ag: POSITIVE — AB

## 2023-02-08 MED ORDER — PREDNISONE 10 MG PO TABS
ORAL_TABLET | ORAL | 0 refills | Status: DC
Start: 2023-02-08 — End: 2023-05-31

## 2023-02-08 MED ORDER — CITALOPRAM HYDROBROMIDE 10 MG PO TABS
10.0000 mg | ORAL_TABLET | Freq: Every day | ORAL | 0 refills | Status: DC
Start: 2023-02-08 — End: 2023-05-31

## 2023-02-08 MED ORDER — NIRMATRELVIR/RITONAVIR (PAXLOVID)TABLET: 3.0000 | ORAL_TABLET | Freq: Two times a day (BID) | ORAL | 0 refills | Status: AC

## 2023-02-08 MED ORDER — PROMETHAZINE-DM 6.25-15 MG/5ML PO SYRP: 5.0000 mL | ORAL_SOLUTION | Freq: Four times a day (QID) | ORAL | 0 refills | Status: DC | PRN

## 2023-02-08 NOTE — Assessment & Plan Note (Signed)
Deteriorated Continue bupropion at current dose Will trial citalopram 10 mg daily Support offered
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
## 2023-02-08 NOTE — Patient Instructions (Signed)

## 2023-02-10 ENCOUNTER — Other Ambulatory Visit: Payer: Self-pay | Admitting: Internal Medicine

## 2023-02-10 MED ORDER — ONDANSETRON 4 MG PO TBDP
4.0000 mg | ORAL_TABLET | Freq: Three times a day (TID) | ORAL | 0 refills | Status: DC | PRN
Start: 2023-02-10 — End: 2023-02-11

## 2023-02-11 ENCOUNTER — Emergency Department
Admission: EM | Admit: 2023-02-11 | Discharge: 2023-02-11 | Disposition: A | Discharge status: Home / Self Care | Payer: BC Managed Care – PPO | Attending: Emergency Medicine | Admitting: Emergency Medicine

## 2023-02-11 DIAGNOSIS — U071 COVID-19: Secondary | ICD-10-CM | POA: Diagnosis not present

## 2023-02-11 DIAGNOSIS — R112 Nausea with vomiting, unspecified: Secondary | ICD-10-CM | POA: Diagnosis present

## 2023-02-11 LAB — CBC
HCT: 49.9 % (ref 39.0–52.0)
Hemoglobin: 16.6 g/dL (ref 13.0–17.0)
MCH: 30.3 pg (ref 26.0–34.0)
MCHC: 33.3 g/dL (ref 30.0–36.0)
MCV: 91.1 fL (ref 80.0–100.0)
Platelets: 298 10*3/uL (ref 150–400)
RBC: 5.48 MIL/uL (ref 4.22–5.81)
RDW: 12.1 % (ref 11.5–15.5)
WBC: 6.2 10*3/uL (ref 4.0–10.5)
nRBC: 0 % (ref 0.0–0.2)

## 2023-02-11 LAB — COMPREHENSIVE METABOLIC PANEL
ALT: 25 U/L (ref 0–44)
AST: 19 U/L (ref 15–41)
Albumin: 3.9 g/dL (ref 3.5–5.0)
Alkaline Phosphatase: 47 U/L (ref 38–126)
Anion gap: 11 (ref 5–15)
BUN: 23 mg/dL — ABNORMAL HIGH (ref 6–20)
CO2: 23 mmol/L (ref 22–32)
Calcium: 9.2 mg/dL (ref 8.9–10.3)
Chloride: 102 mmol/L (ref 98–111)
Creatinine, Ser: 1.14 mg/dL (ref 0.61–1.24)
GFR, Estimated: >60 mL/min (ref >60)
Glucose, Bld: 111 mg/dL — ABNORMAL HIGH (ref 70–99)
Potassium: 3.5 mmol/L (ref 3.5–5.1)
Sodium: 136 mmol/L (ref 135–145)
Total Bilirubin: 1.7 mg/dL — ABNORMAL HIGH (ref 0.3–1.2)
Total Protein: 7.9 g/dL (ref 6.5–8.1)

## 2023-02-11 LAB — SARS CORONAVIRUS 2 BY RT PCR: SARS Coronavirus 2 by RT PCR: POSITIVE — AB

## 2023-02-11 MED ORDER — SODIUM CHLORIDE 0.9 % IV SOLN: Freq: Once | INTRAVENOUS | Status: AC

## 2023-02-11 MED ORDER — ONDANSETRON 4 MG PO TBDP: 4.0000 mg | ORAL_TABLET | Freq: Three times a day (TID) | ORAL | 0 refills | Status: DC | PRN

## 2023-02-11 MED ORDER — ONDANSETRON HCL 4 MG/2ML IJ SOLN
4.0000 mg | Freq: Once | INTRAMUSCULAR | Status: AC
  Administered 2023-02-11: 4 mg via INTRAVENOUS
  Filled 2023-02-11: qty 2

## 2023-02-11 NOTE — ED Provider Notes (Signed)
[facility] Provider Note    Event Date/Time   First MD Initiated Contact with Patient 02/11/23 1112     (approximate)   History   Emesis and Diarrhea   HPI  Sergio Garcia is a 47 y.o. male who presents with complaints of nausea vomiting and diarrhea over the last 3 to 5 days, he reports that he feels dehydrated.  He denies abdominal pain.  No fevers.     Physical Exam   Triage Vital Signs: ED Triage Vitals  Encounter Vitals Group     BP 02/11/23 1107 (!) 125/96     Systolic BP Percentile --      Diastolic BP Percentile --      Pulse Rate 02/11/23 1107 76     Resp 02/11/23 1107 18     Temp 02/11/23 1107 97.7 F (36.5 C)     Temp Source 02/11/23 1107 Oral     SpO2 02/11/23 1107 99 %     Weight 02/11/23 1106 86.2 kg (190 lb)     Height 02/11/23 1106 1.88 m (6\' 2" )     Head Circumference --      Peak Flow --      Pain Score 02/11/23 1106 0     Pain Loc --      Pain Education --      Exclude from Growth Chart --     Most recent vital signs: Vitals:   02/11/23 1107  BP: (!) 125/96  Pulse: 76  Resp: 18  Temp: 97.7 F (36.5 C)  SpO2: 99%     General: Awake, no distress.  CV:  Good peripheral perfusion.  Resp:  Normal effort.  Abd:  No distention.  Soft, nontender Other:     ED Results / Procedures / Treatments   Labs (all labs ordered are listed, but only abnormal results are displayed) Labs Reviewed  SARS CORONAVIRUS 2 BY RT PCR - Abnormal; Notable for the following components:      Result Value   SARS Coronavirus 2 by RT PCR POSITIVE (*)    All other components within normal limits  COMPREHENSIVE METABOLIC PANEL - Abnormal; Notable for the following components:   Glucose, Bld 111 (*)    BUN 23 (*)    Total Bilirubin 1.7 (*)    All other components within normal limits  CBC     EKG     RADIOLOGY     PROCEDURES:  Critical Care performed:   Procedures   MEDICATIONS ORDERED IN ED: Medications  0.9 %   sodium chloride infusion ( Intravenous New Bag/Given 02/11/23 1130)  ondansetron (ZOFRAN) injection 4 mg (4 mg Intravenous Given 02/11/23 1131)     IMPRESSION / MDM / ASSESSMENT AND PLAN / ED COURSE  I reviewed the triage vital signs and the nursing notes. Patient's presentation is most consistent with acute illness / injury with system symptoms.  Patient presents with nausea vomiting diarrhea as detailed above, suspicious for viral gastroenteritis.  Will treat with IV Zofran, IV fluids, obtain labs and reevaluate  Lab work reviewed and is unremarkable  Patient also complains of increased anxiety related to his significant other leaving him last week.  He denies SI or HI.  Does have good PCP follow-up.  No indication for emergent psychiatric consultation at this time  COVID test is positive, this is likely the cause of his symptoms.  Feeling better after IV fluids and IV Zofran, will discharge with ODT Zofran  FINAL CLINICAL IMPRESSION(S) / ED DIAGNOSES   Final diagnoses:  COVID-19     Rx / DC Orders   ED Discharge Orders          Ordered    ondansetron (ZOFRAN-ODT) 4 MG disintegrating tablet  Every 8 hours PRN        02/11/23 1226             Note:  This document was prepared using Dragon voice recognition software and may include unintentional dictation errors.   Jene Every, MD 02/11/23 1228

## 2023-02-11 NOTE — ED Triage Notes (Signed)
Pt to ED via POV c/o "not feeling well" x 1 week. Reports sore throat that has since resolved, but continue with diarrhea and emesis. Last emesis yesterday. Diarrhea today. No s/s of acute distress noted in triage, No known sick contacts.

## 2023-02-14 ENCOUNTER — Ambulatory Visit: Payer: BC Managed Care – PPO | Attending: Medical

## 2023-02-22 ENCOUNTER — Ambulatory Visit: Payer: BC Managed Care – PPO | Attending: Medical | Admitting: Medical

## 2023-02-22 NOTE — Progress Notes (Deleted)
Cardiology Office Note:    Date:  02/22/2023   ID:  Sergio Garcia, DOB 1976-02-09, MRN [id number]  PCP:  [name], NP  [facility] Cardiologist:  [name], MD  [facility] Electrophysiologist:  None   Referring MD: [name], NP   Chief Complaint: ***  History of Present Illness:    Sergio Garcia is a 47 y.o. male with a hx of  normal coronary arteries by LHC on 10/18/19, paroxysmal SVT, recently diagnosed with DVT/PE in 05/2020, Gilbert's syndrome, IBS, GERD, strong family history of CAD, PTSD, anxiety who presents for follow-up.    Remote treadmill test in 2019 at [facility] for atypical chest pain showed excellent exercise capacity, with the patient exercising for 11 minutes, and was negative for ischemia.    He was evaluated by Dr. Kirke Corin as a new patient in 05/2018 for palpitations and shortness of breath with associated extreme fatigue and dizziness. At that time, he reported a syncopal episofe, without injury, approximately 3 weeks prior while walking in his house. Heart monitor showed NSR with an average heart rate of 75bpm, 1 run of SVT lasting 4 beats along with rare PACs and PVCs. Echo showed an LVEF 60-65%, normal LVSF, diastolic dysfunction, mildly dilated aortic root and ascending aorta, no valvular abnormalities.    Patient was seen in May 2021 reporting exertional chest pain.  Lexiscan showed no significant ischemia with T wave inversions noted on leads II, III, aVF, V5, and V6.  EF was 55 to 65%.  No significant coronary artery calcium noted on CT imaging.  Overall, this was a low risk study.   Patient was seen in follow-up in May 2021 with continued symptoms.  Right and left heart cath showed normal coronaries, normal LVEDP, right heart cath showed normal right and left filling pressures, normal pulmonary pressure, normal cardiac output.   He was seen in June 2021 reporting similar symptoms.  D-dimer was normal.  He underwent repeat echo which showed low  normal LVSF with an EF of 50 to 55%, no wall motion abnormalities, normal LV diastolic function, normal RV SF, trivial MR, and borderline dilation of the aortic root measuring 38 mm.  Repeat heart monitor showed predominantly normal sinus rhythm with an average heart rate of 76 bpm, 2 short runs of SVT with the longest lasting 8 beats with a rate of 144 bpm, rare PACs and PVCs.  CPX on 12/17/2019 showed his main functional limitation was due to deconditioning with no clear cardiopulmonary abnormality noted.   He was seen by his PCP in January 2022 reporting several day history of increasing shortness of breath and intermittent left-sided chest pain.  D-dimer was elevated and he was sent for CTA of the chest which showed subsegmental left-sided PE with evidence of right heart strain.  High-sensitivity troponin was negative x 2.  He was placed on IV heparin and transition to oral Eliquis prior to discharge.  Lower extremity ultrasound was positive for DVT in the left femoral vein. It was suspected DVT/PE or due to self initiating testosterone.  Patient was last seen in December 29, 2022 reporting sharp left-sided chest pain lasting about 5 minutes.  Echo and chest x-ray were ordered as well as stat lab work.  D-dimer, Bement, CBC were all normal.  Today,    Past Medical History:  Diagnosis Date   Anxiety    COVID-19    07/04/20   Depression    Enteritis    Gastritis  GERD (gastroesophageal reflux disease)    Gilbert's syndrome    hyperbilirubinemia   IBS (irritable bowel syndrome)    Inguinal hernia    Insomnia    Pre-diabetes    Prostatitis 2008    Past Surgical History:  Procedure Laterality Date   BACK SURGERY     2 ruptured discs   CHOLECYSTECTOMY  01/31/2014   GB polyp, chronic cholecystitis    ESOPHAGOGASTRODUODENOSCOPY  09/2004   gastritis acute and duodenitis   ESOPHAGOGASTRODUODENOSCOPY  01-14-14   Dr Servando Snare   ESOPHAGOGASTRODUODENOSCOPY N/A 09/16/2021   Procedure:  ESOPHAGOGASTRODUODENOSCOPY (EGD);  Surgeon: Midge Minium, MD;  Location: [facility] ENDOSCOPY;  Service: Endoscopy;  Laterality: N/A;   EXPLORATORY LAPAROTOMY  Jan 2015   Dr Excell Seltzer   LASIK     RIGHT/LEFT HEART CATH AND CORONARY ANGIOGRAPHY N/A 10/18/2019   Procedure: RIGHT/LEFT HEART CATH AND CORONARY ANGIOGRAPHY;  Surgeon: Iran Ouch, MD;  Location: [facility] INVASIVE CV LAB;  Service: Cardiovascular;  Laterality: N/A;    Current Medications: No outpatient medications have been marked as taking for the 02/22/23 encounter (Appointment) with Fransico Michael, Keylan Costabile H, PA-C.     Allergies:   Morphine and codeine, Testosterone, Morphine, and Tdap [tetanus-diphth-acell pertussis]   Social History   Socioeconomic History   Marital status: Divorced    Spouse name: Not on file   Number of children: 2   Years of education: Not on file   Highest education level: High school graduate  Occupational History   Occupation: Sports administrator: [employer]  Tobacco Use   Smoking status: Never   Smokeless tobacco: Never  Vaping Use   Vaping status: Never Used  Substance and Sexual Activity   Alcohol use: Yes    Comment: occassional social   Drug use: No   Sexual activity: Yes  Other Topics Concern   Not on file  Social History Narrative   Married.   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   Enjoys watching racing, Curator, deer hunting.             Social Determinants of Health   Financial Resource Strain: Low Risk  (07/03/2020)   Overall Financial Resource Strain (CARDIA)    Difficulty of Paying Living Expenses: Not hard at all  Food Insecurity: No Food Insecurity (09/03/2018)   Hunger Vital Sign    Worried About Running Out of Food in the Last Year: Never true    Ran Out of Food in the Last Year: Never true  Transportation Needs: No Transportation Needs (09/03/2018)   PRAPARE - Administrator, Civil Service (Medical): No    Lack of  Transportation (Non-Medical): No  Physical Activity: Inactive (09/03/2018)   Exercise Vital Sign    Days of Exercise per Week: 0 days    Minutes of Exercise per Session: 0 min  Stress: Stress Concern Present (09/03/2018)   [employer] of Occupational Health - Occupational Stress Questionnaire    Feeling of Stress : Very much  Social Connections: Unknown (09/03/2018)   Social Connection and Isolation Panel [NHANES]    Frequency of Communication with Friends and Family: Not on file    Frequency of Social Gatherings with Friends and Family: Not on file    Attends Religious Services: 1 to 4 times per year    Active Member of [employer] or Organizations: No    Attends Banker Meetings: Never    Marital Status:  Separated     Family History: The patient's ***family history includes Asthma in his brother and mother; Cancer in his maternal uncle and mother; Colon cancer in his maternal grandfather; Colon polyps in his mother; Diabetes in an other family member; Heart Problems in his paternal grandfather; Heart attack in his father; Heart disease in his father; Pancreatic cancer in his maternal grandfather; Stroke in his cousin. There is no history of Mental illness.  ROS:   Please see the history of present illness.    *** All other systems reviewed and are negative.  EKGs/Labs/Other Studies Reviewed:    The following studies were reviewed today: ***  EKG:  EKG is *** ordered today.  The ekg ordered today demonstrates ***  Recent Labs: 06/16/2022: TSH 2.24 02/11/2023: ALT 25; BUN 23; Creatinine, Ser 1.14; Hemoglobin 16.6; Platelets 298; Potassium 3.5; Sodium 136  Recent Lipid Panel    Component Value Date/Time   CHOL 183 12/19/2019 0901   CHOL 173 01/24/2019 0817   TRIG 90.0 12/19/2019 0901   HDL 41.40 12/19/2019 0901   HDL 43 01/24/2019 0817   CHOLHDL 4 12/19/2019 0901   VLDL 18.0 12/19/2019 0901   LDLCALC 124 (H) 12/19/2019 0901   LDLCALC 108 (H) 01/24/2019 0817      Risk Assessment/Calculations:   {Does this patient have ATRIAL FIBRILLATION?:[phone]}   Physical Exam:    VS:  There were no vitals taken for this visit.    Wt Readings from Last 3 Encounters:  02/11/23 190 lb (86.2 kg)  02/08/23 188 lb (85.3 kg)  12/29/22 195 lb 3.2 oz (88.5 kg)     GEN: *** Well nourished, well developed in no acute distress HEENT: Normal NECK: No JVD; No carotid bruits LYMPHATICS: No lymphadenopathy CARDIAC: ***RRR, no murmurs, rubs, gallops RESPIRATORY:  Clear to auscultation without rales, wheezing or rhonchi  ABDOMEN: Soft, non-tender, non-distended MUSCULOSKELETAL:  No edema; No deformity  SKIN: Warm and dry NEUROLOGIC:  Alert and oriented x 3 PSYCHIATRIC:  Normal affect   ASSESSMENT:    No diagnosis found. PLAN:    In order of problems listed above:  ***  Disposition: Follow up {follow up:15908} with ***   Shared Decision Making/Informed Consent   {Are you ordering a CV Procedure (e.g. stress test, cath, DCCV, TEE, etc)?   Press F2        :[id number]}    Signed, Abhinav Mayorquin David Stall, PA-C  02/22/2023 7:55 AM    [facility]

## 2023-03-23 ENCOUNTER — Ambulatory Visit: Payer: BC Managed Care – PPO | Admitting: Internal Medicine
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
## 2023-05-10 ENCOUNTER — Encounter: Payer: Self-pay | Admitting: Internal Medicine

## 2023-05-10 ENCOUNTER — Ambulatory Visit: Payer: BC Managed Care – PPO | Admitting: Internal Medicine

## 2023-05-10 VITALS — BP 120/80 | Ht 74.0 in | Wt 193.0 lb

## 2023-05-10 DIAGNOSIS — M5412 Radiculopathy, cervical region: Secondary | ICD-10-CM | POA: Diagnosis not present

## 2023-05-10 DIAGNOSIS — Z01818 Encounter for other preprocedural examination: Secondary | ICD-10-CM | POA: Diagnosis not present

## 2023-05-10 DIAGNOSIS — R7303 Prediabetes: Secondary | ICD-10-CM | POA: Diagnosis not present

## 2023-05-10 NOTE — Patient Instructions (Signed)
Cervical Radiculopathy  Cervical radiculopathy means that a nerve in the neck (a cervical nerve) is pinched or bruised. This can happen because of an injury to the cervical spine (vertebrae) in the neck, or as a normal part of getting older. This condition can cause pain or loss of feeling (numbness) that runs from your neck all the way down to your arm and fingers. Often, this condition gets better with rest. Treatment may be needed if the condition does not get better. What are the causes? A neck injury. A bulging disk in your spine. Sudden muscle tightening (muscle spasms). Tight muscles in your neck due to overuse. Arthritis. Breakdown in the bones and joints of the spine (spondylosis) due to getting older. Bone spurs that form near the nerves in the neck. What are the signs or symptoms? Pain. The pain may: Run from the neck to the arm and hand. Be very bad or irritating. Get worse when you move your neck. Loss of feeling or tingling in your arm or hand. Weakness in your arm or hand, in very bad cases. How is this treated? In many cases, treatment is not needed for this condition. With rest, the condition often gets better over time. If treatment is needed, options may include: Wearing a soft neck collar (cervical collar) for short periods of time. Doing exercises (physical therapy) to strengthen your neck muscles. Taking medicines. Having shots (injections) in your spine, in very bad cases. Having surgery. This may be needed if other treatments do not help. The type of surgery that is used will depend on the cause of your condition. Follow these instructions at home: If you have a soft neck collar: Wear it as told by your doctor. Take it off only as told by your doctor. Ask your doctor if you can take the collar off for cleaning and bathing. If you are allowed to take the collar off for cleaning or bathing: Follow instructions from your doctor about how to take off the collar  safely. Clean the collar by wiping it with mild soap and water and drying it completely. Take out any removable pads in the collar every 1-2 days. Wash them by hand with soap and water. Let them air-dry completely before you put them back in the collar. Check your skin under the collar for redness or sores. If you see any, tell your doctor. Managing pain     Take over-the-counter and prescription medicines only as told by your doctor. If told, put ice on the painful area. To do this: If you have a soft neck collar, take if off as told by your doctor. Put ice in a plastic bag. Place a towel between your skin and the bag. Leave the ice on for 20 minutes, 2-3 times a day. Take off the ice if your skin turns bright red. This is very important. If you cannot feel pain, heat, or cold, you have a greater risk of damage to the area. If using ice does not help, you can try using heat. Use the heat source that your doctor recommends, such as a moist heat pack or a heating pad. Place a towel between your skin and the heat source. Leave the heat on for 20-30 minutes. Take off the heat if your skin turns bright red. This is very important. If you cannot feel pain, heat, or cold, you have a greater risk of getting burned. You may try a gentle neck and shoulder rub (massage). Activity Rest as needed. Return  to your normal activities when your doctor says that it is safe. Do exercises as told by your doctor or physical therapist. You may have to avoid lifting. Ask your doctor how much you can safely lift. General instructions Use a flat pillow when you sleep. Do not drive while wearing a soft neck collar. If you do not have a soft neck collar, ask your doctor if it is safe to drive while your neck heals. Ask your doctor if you should avoid driving or using machines while you are taking your medicine. Do not smoke or use any products that contain nicotine or tobacco. If you need help quitting, ask your  doctor. Keep all follow-up visits. Contact a doctor if: Your condition does not get better with treatment. Get help right away if: Your pain gets worse and medicine does not help. You lose feeling or feel weak in your hand, arm, face, or leg. You have a high fever. Your neck is stiff. You cannot control when you poop or pee (have incontinence). You have trouble with walking, balance, or talking. Summary Cervical radiculopathy means that a nerve in the neck is pinched or bruised. A nerve can get pinched from a bulging disk, arthritis, an injury to the neck, or other causes. Symptoms include pain, tingling, or loss of feeling that goes from the neck to the arm or hand. Weakness in your arm or hand can happen in very bad cases. Treatment may include resting, wearing a soft neck collar, and doing exercises. You might need to take medicines for pain. In very bad cases, shots or surgery may be needed. This information is not intended to replace advice given to you by your health care provider. Make sure you discuss any questions you have with your health care provider. Document Revised: 11/12/2020 Document Reviewed: 11/12/2020 Elsevier Patient Education  2024 [employer].
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
## 2023-05-11 LAB — CBC
HCT: 49 % (ref 38.5–50.0)
Hemoglobin: 16.1 g/dL (ref 13.2–17.1)
MCH: 30 pg (ref 27.0–33.0)
MCHC: 32.9 g/dL (ref 32.0–36.0)
MCV: 91.2 fL (ref 80.0–100.0)
MPV: 10.7 fL (ref 7.5–12.5)
Platelets: 268 10*3/uL (ref 140–400)
RBC: 5.37 10*6/uL (ref 4.20–5.80)
RDW: 12.6 % (ref 11.0–15.0)
WBC: 7.9 10*3/uL (ref 3.8–10.8)

## 2023-05-11 LAB — COMPLETE METABOLIC PANEL WITH GFR
AG Ratio: 1.3 (calc) (ref 1.0–2.5)
ALT: 22 U/L (ref 9–46)
AST: 17 U/L (ref 10–40)
Albumin: 4 g/dL (ref 3.6–5.1)
Alkaline phosphatase (APISO): 60 U/L (ref 36–130)
BUN: 13 mg/dL (ref 7–25)
CO2: 29 mmol/L (ref 20–32)
Calcium: 9.4 mg/dL (ref 8.6–10.3)
Chloride: 103 mmol/L (ref 98–110)
Creat: 0.95 mg/dL (ref 0.60–1.29)
Globulin: 3.1 g/dL (ref 1.9–3.7)
Glucose, Bld: 100 mg/dL (ref 65–139)
Potassium: 4.5 mmol/L (ref 3.5–5.3)
Sodium: 140 mmol/L (ref 135–146)
Total Bilirubin: 1.6 mg/dL — ABNORMAL HIGH (ref 0.2–1.2)
Total Protein: 7.1 g/dL (ref 6.1–8.1)
eGFR: 101 mL/min/{1.73_m2} (ref >=60)

## 2023-05-11 LAB — PROTIME-INR
INR: 1
Prothrombin Time: 10.4 s (ref 9.0–11.5)

## 2023-05-11 LAB — HEMOGLOBIN A1C
Hgb A1c MFr Bld: 6.2 %{Hb} — ABNORMAL HIGH (ref <5.7)
Mean Plasma Glucose: 131 mg/dL
eAG (mmol/L): 7.3 mmol/L

## 2023-05-15 ENCOUNTER — Ambulatory Visit: Payer: BC Managed Care – PPO | Attending: Medical

## 2023-05-22 ENCOUNTER — Telehealth: Payer: Self-pay | Admitting: Cardiovascular Disease

## 2023-05-22 ENCOUNTER — Telehealth: Payer: Self-pay | Admitting: Medical

## 2023-05-22 NOTE — Telephone Encounter (Signed)
Pt would like a c/b regarding whether he is able to have Echo done in [place] office if there is a sooner availability due to an upcoming surgery. Please advise

## 2023-05-22 NOTE — Telephone Encounter (Signed)
Pt is scheduled to have Echo 06/07/23. Pt is having back surgery on 05/26/23. Surgeon will not do surgery until Echo is done. Pt wants to know if someone cancels can we call him to came in so he doesn't have to r/s his surgery.

## 2023-05-22 NOTE — Telephone Encounter (Signed)
Scheduling reached out to patient and they assisted with changing his appointment.

## 2023-05-23 ENCOUNTER — Telehealth: Payer: Self-pay | Admitting: Cardiovascular Disease

## 2023-05-23 NOTE — Telephone Encounter (Signed)
   Pre-operative Risk Assessment    Patient Name: Sergio Garcia  DOB: 12/25/1975 MRN: [id number]   Date of last office visit: 12/29/22 Date of next office visit: 05/31/23  Request for Surgical Clearance    Procedure:  ACDF C5-6  Date of Surgery:  Clearance TBD                                Surgeon:  Dr. Donaciano Sprang Surgeon's Group or Practice Name:  [facility] Phone number:  [phone] Fax number:  [phone]   Type of Clearance Requested:   - Medical    Type of Anesthesia:  General    Additional requests/questions:    Signed, Arsenio Celine GAILS   05/23/2023, 1:35 PM

## 2023-05-25 ENCOUNTER — Ambulatory Visit (HOSPITAL_COMMUNITY): Payer: 59 | Attending: Medical

## 2023-05-25 DIAGNOSIS — R072 Precordial pain: Secondary | ICD-10-CM

## 2023-05-25 DIAGNOSIS — I471 Supraventricular tachycardia, unspecified: Secondary | ICD-10-CM | POA: Diagnosis not present

## 2023-05-25 LAB — ECHOCARDIOGRAM COMPLETE
Area-P 1/2: 6.65 cm2
S' Lateral: 2.7 cm

## 2023-05-29 NOTE — Telephone Encounter (Signed)
   Name: INGVALD THEISEN  DOB: March 28, 1976  MRN: [id number]  Primary Cardiologist: Deatrice Cage, MD  Chart reviewed as part of pre-operative protocol coverage. The patient has an upcoming visit scheduled with Cadence Furth, PA on 05/31/23 at which time clearance can be addressed in case there are any issues that would impact surgical recommendations.  ACDF C5-6 is not yet scheduled as below. I added preop FYI to appointment note so that provider is aware to address at time of outpatient visit.  Per office protocol the cardiology provider should forward their finalized clearance decision and recommendations regarding antiplatelet therapy to the requesting party below.    I will route this message as FYI to requesting party and remove this message from the preop box as separate preop APP input not needed at this time.   Please call with any questions.  Lukas Pelcher D Tiesha Marich, NP  05/29/2023, 9:20 AM

## 2023-05-30 NOTE — Progress Notes (Signed)
 Cardiology Office Note:    Date:  05/31/2023   ID:  [name], DOB Aug 19, 1975, MRN [id number]  PCP:  Antonette Angeline ORN, NP  [facility] Cardiologist:  Deatrice Cage, MD  [facility] Electrophysiologist:  None   Referring MD: Antonette Angeline ORN, NP   Chief Complaint: pre-op evaluation  History of Present Illness:    Sergio Garcia is a 48 y.o. 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    Remote treadmill test in 2019 at [facility] for atypical chest pain showed excellent exercise capacity, with the patient exercising for 11 minutes, and was negative for ischemia.    He was evaluated by Dr. Cage as a new patient in 05/2018 for palpitations and shortness of breath with associated extreme fatigue and dizziness. At that time, he reported a syncopal episofe, without injury, approximately 3 weeks prior while walking in his house. Heart monitor showed NSR with an average heart rate of 75bpm, 1 run of SVT lasting 4 beats along with rare PACs and PVCs. Echo showed an LVEF 60-65%, normal LVSF, diastolic dysfunction, mildly dilated aortic root and ascending aorta, no valvular abnormalities.    Patient was seen in May 2021 reporting exertional chest pain.  Lexiscan  showed no significant ischemia with T wave inversions noted on leads II, III, aVF, V5, and V6.  EF was 55 to 65%.  No significant coronary artery calcium noted on CT imaging.  Overall, this was a low risk study.   Patient was seen in follow-up in May 2021 with continued symptoms.  Right and left heart cath showed normal coronaries, normal LVEDP, right heart cath showed normal right and left filling pressures, normal pulmonary pressure, normal cardiac output.   He was seen in June 2021 reporting similar symptoms.  D-dimer was normal.  He underwent repeat echo which  showed low normal LVSF with an EF of 50 to 55%, no wall motion abnormalities, normal LV diastolic function, normal RV SF, trivial MR, and borderline dilation of the aortic root measuring 38 mm.  Repeat heart monitor showed predominantly normal sinus rhythm with an average heart rate of 76 bpm, 2 short runs of SVT with the longest lasting 8 beats with a rate of 144 bpm, rare PACs and PVCs.  CPX on 12/17/2019 showed his main functional limitation was due to deconditioning with no clear cardiopulmonary abnormality noted.   He was seen by his PCP in January 2022 reporting several day history of increasing shortness of breath and intermittent left-sided chest pain.  D-dimer was elevated and he was sent for CTA of the chest which showed subsegmental left-sided PE with evidence of right heart strain.  High-sensitivity troponin was negative x 2.  He was placed on IV heparin  and transition to oral Eliquis  prior to discharge.  Lower extremity ultrasound was positive for DVT in the left femoral vein. It was suspected DVT/PE or due to self initiating testosterone.  The patient was last seen 12/29/22 reporting sharp chest pain. CXR, echo and labs were ordered. Echo showed normal LVEF 55-60%, no WMA, low normal RVSF, apex appears mildly hypokinetic, borderline dilation of the ascending aorta measuring 38mm, borderline dilation of the aortic root measuring 40mm.  Today, the patient reports he is overall doing well. He needs a fusion of his neck. He denies chest pain, SOB, lower leg edema, orthopnea,  or pnd. No heart racing or palpitations. Occasional lightheadedness/dizziness. No h/o sleep apnea. Patient can walk up a flight a stairs. Can do heavy yard work. Can't do lots of heavy lifting now due to neck pain. Not on any blood thinners.   Past Medical History:  Diagnosis Date   Anxiety    COVID-19    07/04/20   Depression    Enteritis    Gastritis    GERD (gastroesophageal reflux disease)    Gilbert's syndrome     hyperbilirubinemia   IBS (irritable bowel syndrome)    Inguinal hernia    Insomnia    Pre-diabetes    Prostatitis 2008    Past Surgical History:  Procedure Laterality Date   BACK SURGERY     2 ruptured discs   CHOLECYSTECTOMY  01/31/2014   GB polyp, chronic cholecystitis    ESOPHAGOGASTRODUODENOSCOPY  09/2004   gastritis acute and duodenitis   ESOPHAGOGASTRODUODENOSCOPY  01-14-14   Dr Jinny   ESOPHAGOGASTRODUODENOSCOPY N/A 09/16/2021   Procedure: ESOPHAGOGASTRODUODENOSCOPY (EGD);  Surgeon: Jinny Carmine, MD;  Location: [facility] ENDOSCOPY;  Service: Endoscopy;  Laterality: N/A;   EXPLORATORY LAPAROTOMY  Jan 2015   Dr Wonda   LASIK     RIGHT/LEFT HEART CATH AND CORONARY ANGIOGRAPHY N/A 10/18/2019   Procedure: RIGHT/LEFT HEART CATH AND CORONARY ANGIOGRAPHY;  Surgeon: Darron Deatrice LABOR, MD;  Location: [facility] INVASIVE CV LAB;  Service: Cardiovascular;  Laterality: N/A;    Current Medications: Current Meds  Medication Sig   ondansetron  (ZOFRAN -ODT) 4 MG disintegrating tablet Take 1 tablet (4 mg total) by mouth every 8 (eight) hours as needed for nausea or vomiting.   traZODone  (DESYREL ) 50 MG tablet Take 0.5-1 tablets (25-50 mg total) by mouth at bedtime as needed. for sleep   VIBERZI  75 MG TABS Take 1 tablet (75 mg total) by mouth 2 (two) times daily.     Allergies:   Morphine and codeine, Testosterone, Morphine, and Tdap [tetanus-diphth-acell pertussis]   Social History   Socioeconomic History   Marital status: Divorced    Spouse name: Not on file   Number of children: 2   Years of education: Not on file   Highest education level: High school graduate  Occupational History   Occupation: Sports Administrator: [employer]  Tobacco Use   Smoking status: Never   Smokeless tobacco: Never  Vaping Use   Vaping status: Never Used  Substance and Sexual Activity   Alcohol use: Yes    Comment: occassional social   Drug use: No   Sexual activity: Yes  Other Topics Concern    Not on file  Social History Narrative   Married.   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   Enjoys watching racing, curator, deer hunting.             Social Drivers of Corporate Investment Banker Strain: Low Risk  (07/03/2020)   Overall Financial Resource Strain (CARDIA)    Difficulty of Paying Living Expenses: Not hard at all  Food Insecurity: No Food Insecurity (09/03/2018)   Hunger Vital Sign    Worried About Running Out of Food in the Last Year: Never true    Ran Out of Food in the Last Year: Never true  Transportation Needs: No Transportation Needs (09/03/2018)   PRAPARE - Administrator, Civil Service (Medical): No    Lack of Transportation (Non-Medical): No  Physical Activity: Inactive (09/03/2018)  Exercise Vital Sign    Days of Exercise per Week: 0 days    Minutes of Exercise per Session: 0 min  Stress: Stress Concern Present (09/03/2018)   [employer] of Occupational Health - Occupational Stress Questionnaire    Feeling of Stress : Very much  Social Connections: Unknown (09/03/2018)   Social Connection and Isolation Panel [NHANES]    Frequency of Communication with Friends and Family: Not on file    Frequency of Social Gatherings with Friends and Family: Not on file    Attends Religious Services: 1 to 4 times per year    Active Member of [employer] or Organizations: No    Attends Banker Meetings: Never    Marital Status: Separated     Family History: The patient's family history includes Asthma in his brother and mother; Cancer in his maternal uncle and mother; Colon cancer in his maternal grandfather; Colon polyps in his mother; Diabetes in an other family member; Heart Problems in his paternal grandfather; Heart attack in his father; Heart disease in his father; Pancreatic cancer in his maternal grandfather; Stroke in his cousin. There is no history of Mental illness.  ROS:   Please see the history of present  illness.     All other systems reviewed and are negative.  EKGs/Labs/Other Studies Reviewed:    The following studies were reviewed today:  Echo 05/2023 1. Left ventricular ejection fraction, by estimation, is 55%-60%. The  left ventricle has normal function. The left ventricle has no regional  wall motion abnormalities. Left ventricular diastolic parameters were  normal. GLS -21.8%.   2. Right ventricular systolic function is low normal. The right  ventricular size is normal. The RV apex appears mildly hypokinetic.   3. The mitral valve is normal in structure. No evidence of mitral valve  regurgitation. No evidence of mitral stenosis.   4. The aortic valve is normal in structure. Aortic valve regurgitation is  trivial. No aortic stenosis is present.   5. Aortic dilatation noted. There is borderline dilatation of the  ascending aorta, measuring 38 mm. There is borderline dilatation of the  aortic root, measuring 40 mm.   6. The inferior vena cava is normal in size with greater than 50%  respiratory variability, suggesting right atrial pressure of 3 mmHg.   EKG:  EKG is ordered today.  The ekg ordered today demonstrates NSR 92bpm, TWI III, no changes  Recent Labs: 06/16/2022: TSH 2.24 05/10/2023: ALT 22; BUN 13; Creat 0.95; Hemoglobin 16.1; Platelets 268; Potassium 4.5; Sodium 140  Recent Lipid Panel    Component Value Date/Time   CHOL 183 12/19/2019 0901   CHOL 173 01/24/2019 0817   TRIG 90.0 12/19/2019 0901   HDL 41.40 12/19/2019 0901   HDL 43 01/24/2019 0817   CHOLHDL 4 12/19/2019 0901   VLDL 18.0 12/19/2019 0901   LDLCALC 124 (H) 12/19/2019 0901   LDLCALC 108 (H) 01/24/2019 0817    Physical Exam:    VS:  BP 128/71 (BP Location: Left Arm, Patient Position: Sitting, Cuff Size: Normal)   Pulse 92   Ht 6' 2 (1.88 m)   Wt 194 lb (88 kg)   SpO2 98%   BMI 24.91 kg/m     Wt Readings from Last 3 Encounters:  05/31/23 194 lb (88 kg)  05/10/23 193 lb (87.5 kg)  02/11/23  190 lb (86.2 kg)     GEN:  Well nourished, well developed in no acute distress HEENT: Normal NECK: No JVD; No  carotid bruits LYMPHATICS: No lymphadenopathy CARDIAC: RRR, no murmurs, rubs, gallops RESPIRATORY:  Clear to auscultation without rales, wheezing or rhonchi  ABDOMEN: Soft, non-tender, non-distended MUSCULOSKELETAL:  No edema; No deformity  SKIN: Warm and dry NEUROLOGIC:  Alert and oriented x 3 PSYCHIATRIC:  Normal affect   ASSESSMENT:    1. Pre-operative cardiovascular examination   2. History of DVT (deep vein thrombosis)   3. PSVT (paroxysmal supraventricular tachycardia) (HCC)    PLAN:    In order of problems listed above:  Pre-operative cardiac evaluation H/o DVT 2022 pSVT The patient needs cervical fusion of his neck. The patient denies chest pain, SOB, LLE, orthopnea, pnd, palpitations, heart racing. He had a recent echo that showed normal LVEF 55-60%, no WMA, low normal RVSF, apex mildly kinetic, borderline dilation of the ascending aorta measuring 38mm, borderline dilation of the aortic root measuring 40mm. Low normal RV function may be from ?PE in 2022. No smoking history, COPD or sleep apnea. No further cardiac work-up at this time. Can follow aortic dilation with serial echocardiograms. Patient is able to walk up 2 flights of stairs, walk 2 blocks, and do heavy yard work. He is not on any blood thinner. According the the RCRI the patient is low risk at 3.9% risk of MACE. No further cardiac work-up required prior to surgery.  Disposition: Follow up in 6 month(s) with MD/APP    Signed, Plato Alspaugh VEAR Fishman, PA-C  05/31/2023 12:39 PM    [facility]

## 2023-05-31 ENCOUNTER — Ambulatory Visit: Payer: 59 | Attending: Medical | Admitting: Medical

## 2023-05-31 ENCOUNTER — Encounter: Payer: Self-pay | Admitting: Medical

## 2023-05-31 VITALS — BP 128/71 | HR 92 | Ht 74.0 in | Wt 194.0 lb

## 2023-05-31 DIAGNOSIS — Z86718 Personal history of other venous thrombosis and embolism: Secondary | ICD-10-CM | POA: Diagnosis not present

## 2023-05-31 DIAGNOSIS — Z01810 Encounter for preprocedural cardiovascular examination: Secondary | ICD-10-CM

## 2023-05-31 DIAGNOSIS — I471 Supraventricular tachycardia, unspecified: Secondary | ICD-10-CM | POA: Diagnosis not present

## 2023-05-31 NOTE — Patient Instructions (Signed)
 Medication Instructions:  - No changes *If you need a refill on your cardiac medications before your next appointment, please call your pharmacy*  Lab Work: - None ordered  Testing/Procedures: - None ordered  Follow-Up: At [facility], you and your health needs are our priority.  As part of our continuing mission to provide you with exceptional heart care, we have created designated Provider Care Teams.  These Care Teams include your primary Cardiologist (physician) and Advanced Practice Providers (APPs -  Physician Assistants and Nurse Practitioners) who all work together to provide you with the care you need, when you need it.  Your next appointment:   6 month(s)  Provider:   You may see Deatrice Cage, MD or one of the following Advanced Practice Providers on your designated Care Team:   Lonni Meager, NP Bernardino Bring, PA-C Cadence Franchester, PA-C Tylene Lunch, NP Barnie Hila, NP

## 2023-06-07 ENCOUNTER — Other Ambulatory Visit: Payer: BC Managed Care – PPO

## 2023-06-12 ENCOUNTER — Ambulatory Visit (HOSPITAL_COMMUNITY): Payer: Self-pay | Admitting: Orthopedic Surgery

## 2023-06-12 NOTE — Pre-Procedure Instructions (Signed)
Surgical Instructions   Your procedure is scheduled on June 19, 2023. Report to [facility] Main Entrance "A" at 5:30 A.M., then check in with the Admitting office. Any questions or running late day of surgery: call [phone]  Questions prior to your surgery date: call [phone], Monday-Friday, 8am-4pm. If you experience any cold or flu symptoms such as cough, fever, chills, shortness of breath, etc. between now and your scheduled surgery, please notify us at the above number.     Remember:  Do not eat after midnight the night before your surgery  You may drink clear liquids until 4:30AM the morning of your surgery.   Clear liquids allowed are: Water, Non-Citrus Juices (without pulp), Carbonated Beverages, Clear Tea (no milk, honey, etc.), Black Coffee Only (NO MILK, CREAM OR POWDERED CREAMER of any kind), and Gatorade.    Take these medicines the morning of surgery with A SIP OF WATER: nortriptyline (PAMELOR)   May take these medicines IF NEEDED: NONE    One week prior to surgery, STOP taking any Aspirin (unless otherwise instructed by your surgeon) Aleve, Naproxen, Ibuprofen, Motrin, Advil, Goody's, BC's, all herbal medications, fish oil, and non-prescription vitamins.                     Do NOT Smoke (Tobacco/Vaping) for 24 hours prior to your procedure.  If you use a CPAP at night, you may bring your mask/headgear for your overnight stay.   You will be asked to remove any contacts, glasses, piercing's, hearing aid's, dentures/partials prior to surgery. Please bring cases for these items if needed.    Patients discharged the day of surgery will not be allowed to drive home, and someone needs to stay with them for 24 hours.  SURGICAL WAITING ROOM VISITATION Patients may have no more than 2 support people in the waiting area - these visitors may rotate.   Pre-op nurse will coordinate an appropriate time for 1 ADULT support person, who may not rotate, to accompany  patient in pre-op.  Children under the age of 76 must have an adult with them who is not the patient and must remain in the main waiting area with an adult.  If the patient needs to stay at the hospital during part of their recovery, the visitor guidelines for inpatient rooms apply.  Please refer to the [facility] website for the visitor guidelines for any additional information.   If you received a COVID test during your pre-op visit  it is requested that you wear a mask when out in public, stay away from anyone that may not be feeling well and notify your surgeon if you develop symptoms. If you have been in contact with anyone that has tested positive in the last 10 days please notify you surgeon.      Pre-operative 5 CHG Bathing Instructions   You can play a key role in reducing the risk of infection after surgery. Your skin needs to be as free of germs as possible. You can reduce the number of germs on your skin by washing with CHG (chlorhexidine gluconate) soap before surgery. CHG is an antiseptic soap that kills germs and continues to kill germs even after washing.   DO NOT use if you have an allergy to chlorhexidine/CHG or antibacterial soaps. If your skin becomes reddened or irritated, stop using the CHG and notify one of our RNs at [phone].   Please shower with the CHG soap starting 4 days before surgery using the  following schedule:     Please keep in mind the following:  DO NOT shave, including legs and underarms, starting the day of your first shower.   You may shave your face at any point before/day of surgery.  Place clean sheets on your bed the day you start using CHG soap. Use a clean washcloth (not used since being washed) for each shower. DO NOT sleep with pets once you start using the CHG.   CHG Shower Instructions:  Wash your face and private area with normal soap. If you choose to wash your hair, wash first with your normal shampoo.  After you use  shampoo/soap, rinse your hair and body thoroughly to remove shampoo/soap residue.  Turn the water OFF and apply about 3 tablespoons (45 ml) of CHG soap to a CLEAN washcloth.  Apply CHG soap ONLY FROM YOUR NECK DOWN TO YOUR TOES (washing for 3-5 minutes)  DO NOT use CHG soap on face, private areas, open wounds, or sores.  Pay special attention to the area where your surgery is being performed.  If you are having back surgery, having someone wash your back for you may be helpful. Wait 2 minutes after CHG soap is applied, then you may rinse off the CHG soap.  Pat dry with a clean towel  Put on clean clothes/pajamas   If you choose to wear lotion, please use ONLY the CHG-compatible lotions that are listed below.  Additional instructions for the day of surgery: DO NOT APPLY any lotions, deodorants, cologne, or perfumes.   Do not bring valuables to the hospital. [facility] is not responsible for any belongings/valuables. Do not wear nail polish, gel polish, artificial nails, or any other type of covering on natural nails (fingers and toes) Do not wear jewelry or makeup Put on clean/comfortable clothes.  Please brush your teeth.  Ask your nurse before applying any prescription medications to the skin.     CHG Compatible Lotions   Aveeno Moisturizing lotion  Cetaphil Moisturizing Cream  Cetaphil Moisturizing Lotion  Clairol Herbal Essence Moisturizing Lotion, Dry Skin  Clairol Herbal Essence Moisturizing Lotion, Extra Dry Skin  Clairol Herbal Essence Moisturizing Lotion, Normal Skin  Curel Age Defying Therapeutic Moisturizing Lotion with Alpha Hydroxy  Curel Extreme Care Body Lotion  Curel Soothing Hands Moisturizing Hand Lotion  Curel Therapeutic Moisturizing Cream, Fragrance-Free  Curel Therapeutic Moisturizing Lotion, Fragrance-Free  Curel Therapeutic Moisturizing Lotion, Original Formula  Eucerin Daily Replenishing Lotion  Eucerin Dry Skin Therapy Plus Alpha Hydroxy Crme  Eucerin  Dry Skin Therapy Plus Alpha Hydroxy Lotion  Eucerin Original Crme  Eucerin Original Lotion  Eucerin Plus Crme Eucerin Plus Lotion  Eucerin TriLipid Replenishing Lotion  Keri Anti-Bacterial Hand Lotion  Keri Deep Conditioning Original Lotion Dry Skin Formula Softly Scented  Keri Deep Conditioning Original Lotion, Fragrance Free Sensitive Skin Formula  Keri Lotion Fast Absorbing Fragrance Free Sensitive Skin Formula  Keri Lotion Fast Absorbing Softly Scented Dry Skin Formula  Keri Original Lotion  Keri Skin Renewal Lotion Keri Silky Smooth Lotion  Keri Silky Smooth Sensitive Skin Lotion  Nivea Body Creamy Conditioning Oil  Nivea Body Extra Enriched Lotion  Nivea Body Original Lotion  Nivea Body Sheer Moisturizing Lotion Nivea Crme  Nivea Skin Firming Lotion  NutraDerm 30 Skin Lotion  NutraDerm Skin Lotion  NutraDerm Therapeutic Skin Cream  NutraDerm Therapeutic Skin Lotion  ProShield Protective Hand Cream  Provon moisturizing lotion  Please read over the following fact sheets that you were given.

## 2023-06-13 ENCOUNTER — Other Ambulatory Visit: Payer: Self-pay

## 2023-06-13 ENCOUNTER — Encounter (HOSPITAL_COMMUNITY): Payer: Self-pay

## 2023-06-13 ENCOUNTER — Encounter (HOSPITAL_COMMUNITY)
Admission: RE | Admit: 2023-06-13 | Discharge: 2023-06-13 | Disposition: A | Discharge status: Home / Self Care | Payer: 59 | Source: Ambulatory Visit | Attending: Orthopedic Surgery | Admitting: Orthopedic Surgery

## 2023-06-13 VITALS — BP 135/88 | HR 105 | Temp 98.0°F | Resp 18 | Ht 74.0 in | Wt 199.0 lb

## 2023-06-13 DIAGNOSIS — Z8719 Personal history of other diseases of the digestive system: Secondary | ICD-10-CM | POA: Insufficient documentation

## 2023-06-13 DIAGNOSIS — M5412 Radiculopathy, cervical region: Secondary | ICD-10-CM | POA: Insufficient documentation

## 2023-06-13 DIAGNOSIS — K589 Irritable bowel syndrome without diarrhea: Secondary | ICD-10-CM | POA: Insufficient documentation

## 2023-06-13 DIAGNOSIS — Z9049 Acquired absence of other specified parts of digestive tract: Secondary | ICD-10-CM | POA: Insufficient documentation

## 2023-06-13 DIAGNOSIS — R7303 Prediabetes: Secondary | ICD-10-CM | POA: Insufficient documentation

## 2023-06-13 DIAGNOSIS — Z86711 Personal history of pulmonary embolism: Secondary | ICD-10-CM | POA: Insufficient documentation

## 2023-06-13 DIAGNOSIS — I739 Peripheral vascular disease, unspecified: Secondary | ICD-10-CM | POA: Insufficient documentation

## 2023-06-13 DIAGNOSIS — I77819 Aortic ectasia, unspecified site: Secondary | ICD-10-CM | POA: Insufficient documentation

## 2023-06-13 DIAGNOSIS — I2510 Atherosclerotic heart disease of native coronary artery without angina pectoris: Secondary | ICD-10-CM | POA: Diagnosis not present

## 2023-06-13 DIAGNOSIS — Z01812 Encounter for preprocedural laboratory examination: Secondary | ICD-10-CM | POA: Diagnosis present

## 2023-06-13 DIAGNOSIS — Z86718 Personal history of other venous thrombosis and embolism: Secondary | ICD-10-CM | POA: Insufficient documentation

## 2023-06-13 DIAGNOSIS — Z01818 Encounter for other preprocedural examination: Secondary | ICD-10-CM

## 2023-06-13 DIAGNOSIS — F419 Anxiety disorder, unspecified: Secondary | ICD-10-CM | POA: Diagnosis not present

## 2023-06-13 DIAGNOSIS — K219 Gastro-esophageal reflux disease without esophagitis: Secondary | ICD-10-CM | POA: Diagnosis not present

## 2023-06-13 LAB — BASIC METABOLIC PANEL
Anion gap: 10 (ref 5–15)
BUN: 14 mg/dL (ref 6–20)
CO2: 27 mmol/L (ref 22–32)
Calcium: 9.2 mg/dL (ref 8.9–10.3)
Chloride: 101 mmol/L (ref 98–111)
Creatinine, Ser: 1.17 mg/dL (ref 0.61–1.24)
GFR, Estimated: >60 mL/min (ref >60)
Glucose, Bld: 82 mg/dL (ref 70–99)
Potassium: 4.2 mmol/L (ref 3.5–5.1)
Sodium: 138 mmol/L (ref 135–145)

## 2023-06-13 LAB — TYPE AND SCREEN
ABO/RH(D): A NEG
Antibody Screen: NEGATIVE

## 2023-06-13 LAB — CBC
HCT: 46.9 % (ref 39.0–52.0)
Hemoglobin: 15.8 g/dL (ref 13.0–17.0)
MCH: 30.6 pg (ref 26.0–34.0)
MCHC: 33.7 g/dL (ref 30.0–36.0)
MCV: 90.9 fL (ref 80.0–100.0)
Platelets: 296 10*3/uL (ref 150–400)
RBC: 5.16 MIL/uL (ref 4.22–5.81)
RDW: 12.3 % (ref 11.5–15.5)
WBC: 7.8 10*3/uL (ref 4.0–10.5)
nRBC: 0 % (ref 0.0–0.2)

## 2023-06-13 LAB — SURGICAL PCR SCREEN
MRSA, PCR: NEGATIVE
Staphylococcus aureus: NEGATIVE

## 2023-06-13 NOTE — Progress Notes (Signed)
PCP - Nicki Reaper, NP Cardiologist - [name], MD  PPM/ICD - denies Device Orders - n/a Rep Notified - n/a  Chest x-ray - 12/29/2022 EKG - 05/30/2022  Stress Test - 12/17/2019 ECHO - 05/25/2023 Cardiac Cath - 10/18/2019  Sleep Study - denies CPAP - n/a  Fasting Blood Sugar - no DM Checks Blood Sugar _____ times a day  Last dose of GLP1 agonist-  n/a GLP1 instructions: n/a  Blood Thinner Instructions: n/a Aspirin Instructions: n/a  ERAS Protcol - yes, till 0430 AM PRE-SURGERY Ensure or G2- no  COVID TEST- n/a   Anesthesia review: yes, cardiac clearance.    Patient denies shortness of breath, fever, cough and chest pain at PAT appointment   All instructions explained to the patient, with a verbal understanding of the material. Patient agrees to go over the instructions while at home for a better understanding. Patient also instructed to self quarantine after being tested for COVID-19. The opportunity to ask questions was provided.

## 2023-06-14 ENCOUNTER — Encounter (HOSPITAL_COMMUNITY): Payer: Self-pay | Admitting: Orthopedic Surgery

## 2023-06-14 NOTE — Anesthesia Preprocedure Evaluation (Addendum)
Anesthesia Evaluation  Patient identified by MRN, date of birth, ID band Patient awake    Reviewed: Allergy & Precautions, NPO status , Patient's Chart, lab work & pertinent test results  History of Anesthesia Complications Negative for: history of anesthetic complications  Airway Mallampati: III  TM Distance: >3 FB Neck ROM: Limited    Dental  (+) Teeth Intact, Dental Advisory Given   Pulmonary    breath sounds clear to auscultation       Cardiovascular  Rhythm:Regular   1. Left ventricular ejection fraction, by estimation, is 55%-60%. The  left ventricle has normal function. The left ventricle has no regional  wall motion abnormalities. Left ventricular diastolic parameters were  normal. GLS -21.8%.   2. Right ventricular systolic function is low normal. The right  ventricular size is normal. The RV apex appears mildly hypokinetic.   3. The mitral valve is normal in structure. No evidence of mitral valve  regurgitation. No evidence of mitral stenosis.   4. The aortic valve is normal in structure. Aortic valve regurgitation is  trivial. No aortic stenosis is present.   5. Aortic dilatation noted. There is borderline dilatation of the  ascending aorta, measuring 38 mm. There is borderline dilatation of the  aortic root, measuring 40 mm.   6. The inferior vena cava is normal in size with greater than 50%  respiratory variability, suggesting right atrial pressure of 3 mmHg.    1.  Normal coronary arteries. 2.  Left ventricular angiography was not performed.  EF was normal by echo.  Normal left ventricular end-diastolic pressure 3.  Right heart catheterization showed normal right and left-sided filling pressures, normal pulmonary pressure and normal cardiac output.   Recommendations: Chest pain and shortness of breath seems to be not cardiac in origin based on normal right and left cardiac catheterization.     Neuro/Psych   PSYCHIATRIC DISORDERS Anxiety Depression     Neuromuscular disease    GI/Hepatic Neg liver ROS,GERD  ,,  Endo/Other  negative endocrine ROS    Renal/GU negative Renal ROS     Musculoskeletal negative musculoskeletal ROS (+)    Abdominal   Peds  Hematology negative hematology ROS (+) Lab Results      Component                Value               Date                      WBC                      7.8                 06/13/2023                HGB                      15.8                06/13/2023                HCT                      46.9                06/13/2023                MCV  90.9                06/13/2023                PLT                      296                 06/13/2023              Anesthesia Other Findings   Reproductive/Obstetrics                              Anesthesia Physical Anesthesia Plan  ASA: 2  Anesthesia Plan: General   Post-op Pain Management: Ofirmev IV (intra-op)* and Ketamine IV*   Induction: Intravenous  PONV Risk Score and Plan: 3 and Treatment may vary due to age or medical condition  Airway Management Planned: Oral ETT and Video Laryngoscope Planned  Additional Equipment: None  Intra-op Plan:   Post-operative Plan: Extubation in OR  Informed Consent: I have reviewed the patients History and Physical, chart, labs and discussed the procedure including the risks, benefits and alternatives for the proposed anesthesia with the patient or authorized representative who has indicated his/her understanding and acceptance.     Dental advisory given  Plan Discussed with: CRNA  Anesthesia Plan Comments: (PAT note written 06/14/2023 by Shonna Chock, PA-C.  )        Anesthesia Quick Evaluation

## 2023-06-14 NOTE — Progress Notes (Signed)
Anesthesia Chart Review:  Case: [id number] Date/Time: 06/19/23 0715   Procedure: ANTERIOR CERVICAL DECOMPRESSION/DISCECTOMY FUSION 1 LEVEL - 150 min   Anesthesia type: General   Pre-op diagnosis: cervical radiculopathy   [facility]OM [facility] / [facility]   Surgeons: Sergio Lick, MD       DISCUSSION: Patient is a 48 year old male scheduled for the above procedure.  History includes never smoker, chest pain (normal coronaries LHC 2021), PSVT (2 short runs on 2021 monitor), pre-diabetes, IBS, Gilbert's syndrome, GERD, IBS, DVT (partially occlusive distal left femoral vein thrombus 06/18/20), PE (left PE 06/18/20), PVD, anxiety, spinal surgery (L5-S1 laminectomy/discectomy 11/28/07), cholecystectomy, enteritis (s/p diagnostic laparoscopy 05/30/13).   He had cardiology follow-up and preoperative evaluation on 05/31/23 with Sergio Garcia, [name], PA-C. She wrote, "The patient needs cervical fusion of his neck. The patient denies chest pain, SOB, LLE, orthopnea, pnd, palpitations, heart racing. He had a recent echo that showed normal LVEF 55-60%, no WMA, low normal RVSF, apex mildly kinetic, borderline dilation of the ascending aorta measuring 38mm, borderline dilation of the aortic root measuring 40mm. Low normal RV function may be from ?PE in 2022. No smoking history, COPD or sleep apnea. No Sergio Garcia cardiac work-up at this time. Can follow aortic dilation with serial echocardiograms. Patient is able to walk up 2 flights of stairs, walk 2 blocks, and do heavy yard work. He is not on any blood thinner. According the the RCRI the patient is low risk at 3.9% risk of MACE. No Sergio Garcia cardiac work-up required prior to surgery."  Anesthesia team to evaluate on the day of surgery.    VS: BP 135/88   Pulse (!) 105   Temp 36.7 C   Resp 18   Ht 6\' 2"  (1.88 m)   Wt 90.3 kg   SpO2 99%   BMI 25.55 kg/m   PROVIDERS: Sergio Munroe, NP is PCP  Sergio Bears, MD is cardiologist    LABS: Labs reviewed: Acceptable  for surgery. (all labs ordered are listed, but only abnormal results are displayed)  Labs Reviewed  SURGICAL PCR SCREEN  BASIC METABOLIC PANEL  CBC  TYPE AND SCREEN    IMAGES: MRI Brain 02/07/23: IMPRESSION: 1. Unremarkable MRI appearance of the brain. 2. No cerebellopontine angle or internal auditory canal mass. 3. No etiology of hearing loss is identified. 4. Paranasal sinus disease as described.  CXR 12/29/22: FINDINGS: - The cardiomediastinal silhouette is normal - There is no focal consolidation or pulmonary edema. There is no pleural effusion or pneumothorax - There is no acute osseous abnormality. IMPRESSION: No radiographic evidence of acute cardiopulmonary process.   EKG: 05/31/23: Normal sinus rhythm Normal ECG When compared with ECG of 29-Dec-2022 13:46, No significant change was found Confirmed by Sergio Garcia, Sergio Garcia ([id number]) on 05/31/2023 11:49:42 AM   CV: Echo 05/25/23: IMPRESSIONS   1. Left ventricular ejection fraction, by estimation, is 55%-60%. The  left ventricle has normal function. The left ventricle has no regional  wall motion abnormalities. Left ventricular diastolic parameters were  normal. GLS -21.8%.   2. Right ventricular systolic function is low normal. The right  ventricular size is normal. The RV apex appears mildly hypokinetic.   3. The mitral valve is normal in structure. No evidence of mitral valve  regurgitation. No evidence of mitral stenosis.   4. The aortic valve is normal in structure. Aortic valve regurgitation is  trivial. No aortic stenosis is present.   5. Aortic dilatation noted. There is borderline dilatation of the  ascending  aorta, measuring 38 mm. There is borderline dilatation of the  aortic root, measuring 40 mm.   6. The inferior vena cava is normal in size with greater than 50%  respiratory variability, suggesting right atrial pressure of 3 mmHg.    CPX 12/17/19: Mild functional limitation likely due mainly to decondition.  No clear cardiopulmonary abnormality. Would recommend exercise training program and repeat testing in 6 months if symptoms persist.    Long term monitor 10/30/19 - 11/05/19: 6-day ZIO monitor: Normal sinus rhythm with an average heart rate of 76 bpm. 2 short runs of SVT the longest lasted only 8 beats with a rate of 144 bpm. Rare PACs and rare PVCs. Reported symptoms included shortness of breath, dizziness and lightheadedness.  He is noted to be in sinus rhythm during these reported symptoms. The 2 SVT episodes were not associated with symptoms.   RHC/LHC 10/18/19: 1.  Normal coronary arteries. 2.  Left ventricular angiography was not performed.  EF was normal by echo.  Normal left ventricular end-diastolic pressure 3.  Right heart catheterization showed normal right and left-sided filling pressures, normal pulmonary pressure and normal cardiac output.   Recommendations: Chest pain and shortness of breath seems to be not cardiac in origin based on normal right and left cardiac catheterization.   Past Medical History:  Diagnosis Date   Anginal pain (HCC) 2024   Anxiety    COVID-19    07/04/20   Deep vein blood clot of left lower extremity (HCC) 2022   Depression    Enteritis    Gastritis    GERD (gastroesophageal reflux disease)    Gilbert's syndrome    hyperbilirubinemia   IBS (irritable bowel syndrome)    Inguinal hernia    Insomnia    Peripheral vascular disease (HCC)    Pre-diabetes    Prostatitis 2008   Pulmonary embolism (HCC) 2022    Past Surgical History:  Procedure Laterality Date   BACK SURGERY  2009   2 ruptured discs   CHOLECYSTECTOMY  01/31/2014   GB polyp, chronic cholecystitis    ESOPHAGOGASTRODUODENOSCOPY  09/2004   gastritis acute and duodenitis   ESOPHAGOGASTRODUODENOSCOPY  01/14/2014   Dr Servando Snare   ESOPHAGOGASTRODUODENOSCOPY N/A 09/16/2021   Procedure: ESOPHAGOGASTRODUODENOSCOPY (EGD);  Surgeon: Midge Minium, MD;  Location: [facility] ENDOSCOPY;  Service:  Endoscopy;  Laterality: N/A;   EXPLORATORY LAPAROTOMY  05/2013   Dr Excell Seltzer   LASIK Bilateral 2012   RIGHT/LEFT HEART CATH AND CORONARY ANGIOGRAPHY N/A 10/18/2019   Procedure: RIGHT/LEFT HEART CATH AND CORONARY ANGIOGRAPHY;  Surgeon: Iran Ouch, MD;  Location: [facility] INVASIVE CV LAB;  Service: Cardiovascular;  Laterality: N/A;    MEDICATIONS:  ibuprofen (ADVIL) 200 MG tablet   nortriptyline (PAMELOR) 50 MG capsule   ondansetron (ZOFRAN-ODT) 4 MG disintegrating tablet   QUEtiapine (SEROQUEL) 50 MG tablet   traZODone (DESYREL) 50 MG tablet   VIBERZI 75 MG TABS   No current facility-administered medications for this encounter.    Shonna Chock, PA-C Surgical Short Stay/Anesthesiology [facility] Phone [phone] [facility] Phone [phone] 06/14/2023 5:05 PM

## 2023-06-19 ENCOUNTER — Ambulatory Visit (HOSPITAL_BASED_OUTPATIENT_CLINIC_OR_DEPARTMENT_OTHER): Payer: 59 | Admitting: Anesthesiology

## 2023-06-19 ENCOUNTER — Other Ambulatory Visit: Payer: Self-pay

## 2023-06-19 ENCOUNTER — Ambulatory Visit (HOSPITAL_COMMUNITY): Payer: 59

## 2023-06-19 ENCOUNTER — Encounter (HOSPITAL_COMMUNITY): Payer: Self-pay | Admitting: Orthopedic Surgery

## 2023-06-19 ENCOUNTER — Observation Stay (HOSPITAL_COMMUNITY)
Admission: RE | Admit: 2023-06-19 | Discharge: 2023-06-20 | Disposition: A | Discharge status: Home / Self Care | Payer: 59 | Attending: Orthopedic Surgery | Admitting: Orthopedic Surgery

## 2023-06-19 ENCOUNTER — Ambulatory Visit (HOSPITAL_COMMUNITY): Payer: 59 | Admitting: Vascular Surgery

## 2023-06-19 ENCOUNTER — Encounter (HOSPITAL_COMMUNITY)
Admission: RE | Disposition: A | Discharge status: Home / Self Care | Payer: Self-pay | Source: Home / Self Care | Attending: Orthopedic Surgery

## 2023-06-19 DIAGNOSIS — Z86711 Personal history of pulmonary embolism: Secondary | ICD-10-CM | POA: Diagnosis not present

## 2023-06-19 DIAGNOSIS — M5020 Other cervical disc displacement, unspecified cervical region: Principal | ICD-10-CM | POA: Diagnosis present

## 2023-06-19 DIAGNOSIS — Z8616 Personal history of COVID-19: Secondary | ICD-10-CM | POA: Diagnosis not present

## 2023-06-19 DIAGNOSIS — Z79899 Other long term (current) drug therapy: Secondary | ICD-10-CM | POA: Insufficient documentation

## 2023-06-19 DIAGNOSIS — M5412 Radiculopathy, cervical region: Secondary | ICD-10-CM

## 2023-06-19 DIAGNOSIS — Z86718 Personal history of other venous thrombosis and embolism: Secondary | ICD-10-CM | POA: Insufficient documentation

## 2023-06-19 DIAGNOSIS — M4722 Other spondylosis with radiculopathy, cervical region: Principal | ICD-10-CM | POA: Insufficient documentation

## 2023-06-19 HISTORY — PX: ANTERIOR CERVICAL DECOMP/DISCECTOMY FUSION: SHX1161

## 2023-06-19 HISTORY — DX: Peripheral vascular disease, unspecified: I73.9

## 2023-06-19 LAB — ABO/RH: ABO/RH(D): A NEG

## 2023-06-19 SURGERY — ANTERIOR CERVICAL DECOMPRESSION/DISCECTOMY FUSION 1 LEVEL
Anesthesia: General | Site: Neck

## 2023-06-19 MED ORDER — SODIUM CHLORIDE 0.9% FLUSH: 3.0000 mL | INTRAVENOUS | Status: DC | PRN

## 2023-06-19 MED ORDER — ACETAMINOPHEN 650 MG RE SUPP: 650.0000 mg | RECTAL | Status: DC | PRN

## 2023-06-19 MED ORDER — ROCURONIUM BROMIDE 10 MG/ML (PF) SYRINGE
PREFILLED_SYRINGE | INTRAVENOUS | Status: DC | PRN
  Administered 2023-06-19: 80 mg via INTRAVENOUS
  Administered 2023-06-19: 20 mg via INTRAVENOUS

## 2023-06-19 MED ORDER — TRANEXAMIC ACID-NACL 1000-0.7 MG/100ML-% IV SOLN
1000.0000 mg | INTRAVENOUS | Status: DC
  Filled 2023-06-19: qty 100

## 2023-06-19 MED ORDER — EPHEDRINE 5 MG/ML INJ
INTRAVENOUS | Status: AC
  Filled 2023-06-19: qty 5

## 2023-06-19 MED ORDER — LACTATED RINGERS IV SOLN: INTRAVENOUS | Status: DC | PRN

## 2023-06-19 MED ORDER — THROMBIN 20000 UNITS EX SOLR
CUTANEOUS | Status: AC
  Filled 2023-06-19: qty 20000

## 2023-06-19 MED ORDER — MENTHOL 3 MG MT LOZG: 1.0000 | LOZENGE | OROMUCOSAL | Status: DC | PRN

## 2023-06-19 MED ORDER — PHENYLEPHRINE 80 MCG/ML (10ML) SYRINGE FOR IV PUSH (FOR BLOOD PRESSURE SUPPORT)
PREFILLED_SYRINGE | INTRAVENOUS | Status: AC
  Filled 2023-06-19: qty 20

## 2023-06-19 MED ORDER — DEXAMETHASONE 4 MG PO TABS
4.0000 mg | ORAL_TABLET | Freq: Four times a day (QID) | ORAL | Status: AC
  Administered 2023-06-19 (×3): 4 mg via ORAL
  Filled 2023-06-19 (×3): qty 1

## 2023-06-19 MED ORDER — FENTANYL CITRATE (PF) 250 MCG/5ML IJ SOLN
INTRAMUSCULAR | Status: AC
  Filled 2023-06-19: qty 5

## 2023-06-19 MED ORDER — CEFAZOLIN SODIUM-DEXTROSE 1-4 GM/50ML-% IV SOLN
1.0000 g | Freq: Three times a day (TID) | INTRAVENOUS | Status: AC
  Administered 2023-06-19 (×2): 1 g via INTRAVENOUS
  Filled 2023-06-19 (×2): qty 50

## 2023-06-19 MED ORDER — LACTATED RINGERS IV SOLN: INTRAVENOUS | Status: DC

## 2023-06-19 MED ORDER — BUPIVACAINE-EPINEPHRINE 0.25% -1:200000 IJ SOLN
INTRAMUSCULAR | Status: DC | PRN
  Administered 2023-06-19: 7 mL

## 2023-06-19 MED ORDER — ONDANSETRON HCL 4 MG/2ML IJ SOLN
INTRAMUSCULAR | Status: DC | PRN
  Administered 2023-06-19: 4 mg via INTRAVENOUS

## 2023-06-19 MED ORDER — DEXAMETHASONE SODIUM PHOSPHATE 10 MG/ML IJ SOLN
INTRAMUSCULAR | Status: AC
  Filled 2023-06-19: qty 3

## 2023-06-19 MED ORDER — QUETIAPINE FUMARATE 50 MG PO TABS
50.0000 mg | ORAL_TABLET | Freq: Every day | ORAL | Status: DC
  Administered 2023-06-19: 50 mg via ORAL
  Filled 2023-06-19: qty 1

## 2023-06-19 MED ORDER — PHENOL 1.4 % MT LIQD
1.0000 | OROMUCOSAL | Status: DC | PRN
  Filled 2023-06-19: qty 177

## 2023-06-19 MED ORDER — SODIUM CHLORIDE 0.9% FLUSH
3.0000 mL | Freq: Two times a day (BID) | INTRAVENOUS | Status: DC
  Administered 2023-06-19 (×2): 3 mL via INTRAVENOUS

## 2023-06-19 MED ORDER — OXYCODONE HCL 5 MG/5ML PO SOLN
ORAL | Status: AC
  Filled 2023-06-19: qty 5

## 2023-06-19 MED ORDER — LIDOCAINE 2% (20 MG/ML) 5 ML SYRINGE
INTRAMUSCULAR | Status: DC | PRN
  Administered 2023-06-19: 80 mg via INTRAVENOUS

## 2023-06-19 MED ORDER — LIDOCAINE 2% (20 MG/ML) 5 ML SYRINGE
INTRAMUSCULAR | Status: AC
  Filled 2023-06-19: qty 10

## 2023-06-19 MED ORDER — ONDANSETRON HCL 4 MG PO TABS: 4.0000 mg | ORAL_TABLET | Freq: Four times a day (QID) | ORAL | Status: DC | PRN

## 2023-06-19 MED ORDER — SODIUM CHLORIDE 0.9 % IV SOLN
250.0000 mL | INTRAVENOUS | Status: DC
Start: 2023-06-19 — End: 2023-06-20

## 2023-06-19 MED ORDER — ALBUMIN HUMAN 5 % IV SOLN: INTRAVENOUS | Status: DC | PRN

## 2023-06-19 MED ORDER — LACTATED RINGERS IV SOLN
INTRAVENOUS | Status: DC
Start: 2023-06-19 — End: 2023-06-20

## 2023-06-19 MED ORDER — OXYCODONE HCL 5 MG PO TABS
10.0000 mg | ORAL_TABLET | ORAL | Status: DC | PRN
  Administered 2023-06-19 – 2023-06-20 (×5): 10 mg via ORAL
  Filled 2023-06-19 (×5): qty 2

## 2023-06-19 MED ORDER — PROPOFOL 10 MG/ML IV BOLUS
INTRAVENOUS | Status: AC
  Filled 2023-06-19: qty 20

## 2023-06-19 MED ORDER — ACETAMINOPHEN 500 MG PO TABS: 1000.0000 mg | ORAL_TABLET | Freq: Once | ORAL | Status: DC | PRN

## 2023-06-19 MED ORDER — OXYCODONE HCL 5 MG PO TABS: 5.0000 mg | ORAL_TABLET | Freq: Once | ORAL | Status: AC | PRN

## 2023-06-19 MED ORDER — POLYETHYLENE GLYCOL 3350 17 G PO PACK: 17.0000 g | PACK | Freq: Every day | ORAL | Status: DC | PRN

## 2023-06-19 MED ORDER — ONDANSETRON HCL 4 MG PO TABS: 4.0000 mg | ORAL_TABLET | Freq: Three times a day (TID) | ORAL | 0 refills | Status: DC | PRN

## 2023-06-19 MED ORDER — METHOCARBAMOL 500 MG PO TABS: 500.0000 mg | ORAL_TABLET | Freq: Three times a day (TID) | ORAL | 0 refills | Status: AC | PRN

## 2023-06-19 MED ORDER — FENTANYL CITRATE (PF) 100 MCG/2ML IJ SOLN
25.0000 ug | INTRAMUSCULAR | Status: DC | PRN
  Administered 2023-06-19 (×3): 25 ug via INTRAVENOUS

## 2023-06-19 MED ORDER — OXYCODONE HCL 5 MG PO TABS: 5.0000 mg | ORAL_TABLET | ORAL | Status: DC | PRN

## 2023-06-19 MED ORDER — THROMBIN 20000 UNITS EX SOLR: CUTANEOUS | Status: DC | PRN

## 2023-06-19 MED ORDER — METHOCARBAMOL 500 MG PO TABS
500.0000 mg | ORAL_TABLET | Freq: Four times a day (QID) | ORAL | Status: DC | PRN
Start: 2023-06-19 — End: 2023-06-20
  Administered 2023-06-19 – 2023-06-20 (×3): 500 mg via ORAL
  Filled 2023-06-19 (×3): qty 1

## 2023-06-19 MED ORDER — FENTANYL CITRATE (PF) 250 MCG/5ML IJ SOLN
INTRAMUSCULAR | Status: DC | PRN
  Administered 2023-06-19 (×2): 50 ug via INTRAVENOUS
  Administered 2023-06-19: 100 ug via INTRAVENOUS

## 2023-06-19 MED ORDER — HYDROMORPHONE HCL 1 MG/ML IJ SOLN: 0.5000 mg | INTRAMUSCULAR | Status: DC | PRN

## 2023-06-19 MED ORDER — MAGNESIUM CITRATE PO SOLN: 1.0000 | Freq: Once | ORAL | Status: DC | PRN

## 2023-06-19 MED ORDER — MIDAZOLAM HCL 2 MG/2ML IJ SOLN
INTRAMUSCULAR | Status: AC
Start: 2023-06-19 — End: ?
  Filled 2023-06-19: qty 2

## 2023-06-19 MED ORDER — NORTRIPTYLINE HCL 25 MG PO CAPS
50.0000 mg | ORAL_CAPSULE | Freq: Two times a day (BID) | ORAL | Status: DC
  Administered 2023-06-19 – 2023-06-20 (×2): 50 mg via ORAL
  Filled 2023-06-19 (×3): qty 2

## 2023-06-19 MED ORDER — ACETAMINOPHEN 10 MG/ML IV SOLN
INTRAVENOUS | Status: AC
  Filled 2023-06-19: qty 100

## 2023-06-19 MED ORDER — OXYCODONE-ACETAMINOPHEN 10-325 MG PO TABS: 1.0000 | ORAL_TABLET | Freq: Four times a day (QID) | ORAL | 0 refills | Status: AC | PRN

## 2023-06-19 MED ORDER — DEXAMETHASONE SODIUM PHOSPHATE 4 MG/ML IJ SOLN: 4.0000 mg | Freq: Four times a day (QID) | INTRAMUSCULAR | Status: AC

## 2023-06-19 MED ORDER — ACETAMINOPHEN 325 MG PO TABS: 650.0000 mg | ORAL_TABLET | ORAL | Status: DC | PRN

## 2023-06-19 MED ORDER — FENTANYL CITRATE (PF) 100 MCG/2ML IJ SOLN
INTRAMUSCULAR | Status: AC
  Filled 2023-06-19: qty 2

## 2023-06-19 MED ORDER — KETAMINE HCL 50 MG/5ML IJ SOSY
PREFILLED_SYRINGE | INTRAMUSCULAR | Status: AC
  Filled 2023-06-19: qty 5

## 2023-06-19 MED ORDER — METHOCARBAMOL 1000 MG/10ML IJ SOLN
500.0000 mg | Freq: Four times a day (QID) | INTRAMUSCULAR | Status: DC | PRN
Start: 2023-06-19 — End: 2023-06-20

## 2023-06-19 MED ORDER — 0.9 % SODIUM CHLORIDE (POUR BTL) OPTIME
TOPICAL | Status: DC | PRN
  Administered 2023-06-19: 1000 mL

## 2023-06-19 MED ORDER — CEFAZOLIN SODIUM-DEXTROSE 2-4 GM/100ML-% IV SOLN
2.0000 g | INTRAVENOUS | Status: AC
  Administered 2023-06-19: 2 g via INTRAVENOUS
  Filled 2023-06-19: qty 100

## 2023-06-19 MED ORDER — OXYCODONE HCL 5 MG/5ML PO SOLN
5.0000 mg | Freq: Once | ORAL | Status: AC | PRN
  Administered 2023-06-19: 5 mg via ORAL

## 2023-06-19 MED ORDER — PHENYLEPHRINE HCL (PRESSORS) 10 MG/ML IV SOLN
INTRAVENOUS | Status: DC | PRN
  Administered 2023-06-19 (×3): 80 ug via INTRAVENOUS

## 2023-06-19 MED ORDER — ONDANSETRON HCL 4 MG/2ML IJ SOLN
INTRAMUSCULAR | Status: AC
  Filled 2023-06-19: qty 6

## 2023-06-19 MED ORDER — DEXAMETHASONE SODIUM PHOSPHATE 10 MG/ML IJ SOLN
INTRAMUSCULAR | Status: DC | PRN
  Administered 2023-06-19: 10 mg via INTRAVENOUS

## 2023-06-19 MED ORDER — ACETAMINOPHEN 160 MG/5ML PO SOLN: 1000.0000 mg | Freq: Once | ORAL | Status: DC | PRN

## 2023-06-19 MED ORDER — PROPOFOL 10 MG/ML IV BOLUS
INTRAVENOUS | Status: DC | PRN
  Administered 2023-06-19: 40 mg via INTRAVENOUS
  Administered 2023-06-19: 160 mg via INTRAVENOUS

## 2023-06-19 MED ORDER — ELUXADOLINE 75 MG PO TABS: 75.0000 mg | ORAL_TABLET | Freq: Two times a day (BID) | ORAL | Status: DC | PRN

## 2023-06-19 MED ORDER — SUCCINYLCHOLINE CHLORIDE 200 MG/10ML IV SOSY
PREFILLED_SYRINGE | INTRAVENOUS | Status: AC
  Filled 2023-06-19: qty 20

## 2023-06-19 MED ORDER — ORAL CARE MOUTH RINSE: 15.0000 mL | Freq: Once | OROMUCOSAL | Status: AC

## 2023-06-19 MED ORDER — ROCURONIUM BROMIDE 10 MG/ML (PF) SYRINGE
PREFILLED_SYRINGE | INTRAVENOUS | Status: AC
  Filled 2023-06-19: qty 20

## 2023-06-19 MED ORDER — CHLORHEXIDINE GLUCONATE 0.12 % MT SOLN
15.0000 mL | Freq: Once | OROMUCOSAL | Status: AC
  Administered 2023-06-19: 15 mL via OROMUCOSAL
  Filled 2023-06-19: qty 15

## 2023-06-19 MED ORDER — ONDANSETRON HCL 4 MG/2ML IJ SOLN: 4.0000 mg | Freq: Four times a day (QID) | INTRAMUSCULAR | Status: DC | PRN

## 2023-06-19 MED ORDER — ACETAMINOPHEN 10 MG/ML IV SOLN
1000.0000 mg | Freq: Once | INTRAVENOUS | Status: DC | PRN
  Administered 2023-06-19: 1000 mg via INTRAVENOUS

## 2023-06-19 MED ORDER — MIDAZOLAM HCL 2 MG/2ML IJ SOLN
INTRAMUSCULAR | Status: DC | PRN
  Administered 2023-06-19: 2 mg via INTRAVENOUS

## 2023-06-19 MED ORDER — TRAZODONE 25 MG HALF TABLET: 25.0000 mg | ORAL_TABLET | Freq: Every evening | ORAL | Status: DC | PRN

## 2023-06-19 MED ORDER — SUGAMMADEX SODIUM 200 MG/2ML IV SOLN
INTRAVENOUS | Status: DC | PRN
  Administered 2023-06-19: 200 mg via INTRAVENOUS

## 2023-06-19 SURGICAL SUPPLY — 54 items
ALLOGRAFT LORDOTIC 8X11X14 (Bone Implant) IMPLANT
BAG COUNTER SPONGE SURGICOUNT (BAG) ×1 IMPLANT
BLADE CLIPPER SURG (BLADE) IMPLANT
CABLE BIPOLOR RESECTION CORD (MISCELLANEOUS) ×1 IMPLANT
CANISTER SUCT 3000ML PPV (MISCELLANEOUS) ×1 IMPLANT
CLSR STERI-STRIP ANTIMIC 1/2X4 (GAUZE/BANDAGES/DRESSINGS) ×1 IMPLANT
COVER MAYO STAND STRL (DRAPES) ×3 IMPLANT
COVER SURGICAL LIGHT HANDLE (MISCELLANEOUS) ×2 IMPLANT
DRAIN CHANNEL 15F RND FF W/TCR (WOUND CARE) IMPLANT
DRAPE C-ARM 42X72 X-RAY (DRAPES) ×1 IMPLANT
DRAPE POUCH INSTRU U-SHP 10X18 (DRAPES) ×1 IMPLANT
DRAPE SURG 17X23 STRL (DRAPES) ×1 IMPLANT
DRAPE U-SHAPE 47X51 STRL (DRAPES) ×1 IMPLANT
DRSG OPSITE POSTOP 3X4 (GAUZE/BANDAGES/DRESSINGS) ×1 IMPLANT
DURAPREP 26ML APPLICATOR (WOUND CARE) ×1 IMPLANT
ELECT COATED BLADE 2.86 ST (ELECTRODE) ×1 IMPLANT
ELECT PENCIL ROCKER SW 15FT (MISCELLANEOUS) ×1 IMPLANT
ELECT REM PT RETURN 9FT ADLT (ELECTROSURGICAL) ×1
ELECTRODE REM PT RTRN 9FT ADLT (ELECTROSURGICAL) ×1 IMPLANT
GLOVE BIO SURGEON STRL SZ 6.5 (GLOVE) ×1 IMPLANT
GLOVE BIOGEL PI IND STRL 6.5 (GLOVE) ×1 IMPLANT
GLOVE BIOGEL PI IND STRL 8.5 (GLOVE) ×1 IMPLANT
GLOVE SS BIOGEL STRL SZ 8.5 (GLOVE) ×1 IMPLANT
GOWN STRL REUS W/ TWL LRG LVL3 (GOWN DISPOSABLE) ×1 IMPLANT
GOWN STRL REUS W/TWL 2XL LVL3 (GOWN DISPOSABLE) ×1 IMPLANT
KIT BASIN OR (CUSTOM PROCEDURE TRAY) ×1 IMPLANT
KIT TURNOVER KIT B (KITS) ×1 IMPLANT
NDL HYPO 22X1.5 SAFETY MO (MISCELLANEOUS) ×1 IMPLANT
NDL SPNL 18GX3.5 QUINCKE PK (NEEDLE) ×1 IMPLANT
NEEDLE HYPO 22X1.5 SAFETY MO (MISCELLANEOUS) ×1
NEEDLE SPNL 18GX3.5 QUINCKE PK (NEEDLE) ×1
NS IRRIG 1000ML POUR BTL (IV SOLUTION) ×1 IMPLANT
PACK ORTHO CERVICAL (CUSTOM PROCEDURE TRAY) ×1 IMPLANT
PACK UNIVERSAL I (CUSTOM PROCEDURE TRAY) ×1 IMPLANT
PAD ARMBOARD 7.5X6 YLW CONV (MISCELLANEOUS) ×2 IMPLANT
PATTIES SURGICAL .25X.25 (GAUZE/BANDAGES/DRESSINGS) IMPLANT
PIN DISTRATION 14MM (PIN) IMPLANT
PLATE ACP 1.6 18 (Plate) IMPLANT
POSITIONER HEAD DONUT 9IN (MISCELLANEOUS) ×1 IMPLANT
RESTRAINT LIMB HOLDER UNIV (RESTRAINTS) ×1 IMPLANT
SCREW ACP VA SD 3.5X15 (Screw) IMPLANT
SPONGE INTESTINAL PEANUT (DISPOSABLE) ×1 IMPLANT
SPONGE SURGIFOAM ABS GEL SZ50 (HEMOSTASIS) ×1 IMPLANT
SURGIFLO W/THROMBIN 8M KIT (HEMOSTASIS) IMPLANT
SUT BONE WAX W31G (SUTURE) ×1 IMPLANT
SUT MNCRL AB 3-0 PS2 27 (SUTURE) ×1 IMPLANT
SUT VIC AB 2-0 CT1 18 (SUTURE) ×1 IMPLANT
SYR BULB IRRIG 60ML STRL (SYRINGE) ×1 IMPLANT
SYR CONTROL 10ML LL (SYRINGE) ×1 IMPLANT
TAPE CLOTH 4X10 WHT NS (GAUZE/BANDAGES/DRESSINGS) ×1 IMPLANT
TAPE UMBILICAL 1/8X30 (MISCELLANEOUS) ×1 IMPLANT
TOWEL GREEN STERILE (TOWEL DISPOSABLE) ×1 IMPLANT
TOWEL GREEN STERILE FF (TOWEL DISPOSABLE) ×1 IMPLANT
WATER STERILE IRR 1000ML POUR (IV SOLUTION) ×1 IMPLANT

## 2023-06-19 NOTE — Op Note (Signed)
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  Affixed with 15 mm locking screws.  Graft: Triad CC structural allograft cage.  8 mm  BRIEF HISTORY: AQUAN KOPE is a 48 y.o. male who presented my office with persistent neck and radicular right arm pain.  Attempts at conservative management have failed to alleviate his pain or improve his quality of life.  Imaging confirmed a right posterior lateral disc protrusion compressing the right C6 nerve root.  As a result of the failure of conservative management to improve his quality of life we elected to move forward with surgery.  All appropriate risks, benefits, alternatives were discussed and consent was obtained.  PROCEDURE DETAILS: Patient was brought into the operating room and was properly positioned on the operating room table.  After induction with general anesthesia the patient was endotracheally intubated.  A timeout was taken to confirm all important data: including patient, procedure, and the level. Teds, SCD's were applied.   The anterior cervical spine was prepped and draped in a standard fashion.  Using fluoroscopy I marked out a transverse incision over the C5-6 disc space.  I infiltrated the incision with core percent Marcaine and made my transverse incision.  Sharp dissection was carried out down to and through the platysma.  This was a standard Smith-Robinson approach to the cervical spine.  I then proceeded in the avascular plane along the medial border the sternocleidomastoid.  I sharply dissected through the deep  cervical and prevertebral fascia.  I encountered the omohyoid and this was mobilized but not sacrificed.  I then was able to palpate the anterior cervical spine.  I swept the trachea and esophagus to the right and protected with a hand-held retractor and identified the carotid sheath.  I then used Kitner dissectors to remove the remainder of the prevertebral fascia and expose the C5-6 disc space.  A needle was placed into the disc space and an x-ray was taken confirming I was at the appropriate level.  The disc space was then marked with the Bovie.  I then mobilized the longus coli muscle from the midportion of C5 to the midportion of C6 bilaterally.  Caspar retractor blades were placed underneath the longus coli muscle and the endotracheal cuff was deflated.  The retractors were expanded to the appropriate width and the endotracheal cuff reinflated.  Annulotomy was performed with a 15 blade scalpel and I remove the bulk of the disc material with pituitary rongeurs.  I then trimmed down the overhanging anterior osteophyte from the inferior aspect of C5 and continue to work posteriorly with my curettes to remove all of the disc material.  I then placed distraction pins into the body of C5 and C6 and gently distracted disc space with a lamina spreader and maintained with the distraction pins set.  Using fine curettes I continue to work posteriorly removing all the disc material.  I then used a fine nerve hook to begin gently dissecting through the posterior aspect of the disc material and annulus.  I resected this with a 1 mm Kerrison rongeur.  I  then took down the posterior bone spur from the bodies of C5 and C6.  I then began dissecting through the posterior longitudinal ligament.  Once I created a plane under the PLL I resected it with my 1 mm Kerrison rongeur.  This allowed me to undercut the uncovertebral joints to further decompress the nerve.  Small fragment of disc material was removed from the posterior  right side.  I then rasped the endplates until had bleeding subchondral bone and placed the trial implants.  I elected to use the 8 mm size allograft as it provided the best overall fit.  The wound was now copiously irrigated with normal saline and I did 1 final check to confirm I had satisfactory discectomy and decompression.  The implant was then obtained and malleted into the disc space and slightly countersunk.  I confirmed satisfactory positioning with fluoroscopy.  Distraction pins were removed and the bleeding bone hole was closed with bone wax.  The plate was then placed into the wound and affixed with locking screws into the bodies of C5 and C6.  All 4 screws had excellent purchase.  I then locked the screws to the plate according manufacture standards.  I irrigated the wound copiously normal saline and make sure that hemostasis with bipolar cautery.  I then returned the trachea and esophagus to midline and closed the platysma with interrupted 2-0 Vicryl sutures.  The skin was closed with 3-0 Monocryl.  Steri-Strips dry dressing and an Aspen collar were applied and the patient was ultimately extubated and transferred the PACU without incident.  The end of the case all needle sponge counts were correct.  Venita Lick, MD 06/19/2023 10:31 AM

## 2023-06-19 NOTE — Discharge Instructions (Signed)
Today you will be discharged from the hospital.  The purpose of the following handout is to help guide you over the next 2 weeks.  First and foremost, be sure you have a follow up appointment with Dr. Shon Baton 2 weeks from the time of your surgery to have your sutures removed.  Please call [facility] [phone] to schedule or confirm this appointment.      Brace You do not have to wear the collar while lying in bed or sitting in a high-backed chair, eating, sleeping or showering.  Other than these instances, you must wear the brace.  You may NOT wear the collar while driving a vehicle (see driving restrictions below).  It is advisable that you wear the collar in public places or while traveling in a car as a passenger.  Dr. Shon Baton will discuss further use of the collar at your 2 week postop visit.  Wound Care You may SHOWER 5 days from the date of surgery.  Shower directly over the steri-strips.  DO NOT scrub or submerge (bath tub, swimming pool, hot tub, etc.) the area.  Pat to dry following your shower.  There is no need for additional dressings other than the steri-strips.  Allow the steri-strips to fall off on their own.  Once the strips have fallen off, you may leave the area undressed.  DO NOT apply lotion/cream/ointment to the area.  The wound must remain dry at all times other than while showering.  Dr. Shon Baton or his staff will remove your stiches at your first postop visit and give you additional instructions regarding wound care at that time.   Activity NO DRIVING FOR 2 WEEKS.  No lifting over 5 pounds (approximately a gallon of milk).  No bending, stooping, squatting or twisting.  No overhead activities.  We encourage you to walk (short distances and often throughout the day) as you can tolerate.  A good rule of thumb is to get up and move once or twice every hour.  You may go up and down stairs carefully.  As you continue to recover, Dr. Shon Baton will address and  adjust restrictions to your activities until no further restrictions are needed.  However, until your first postop visit, when Dr. Shon Baton can assess your recovery, you are to follow these instructions.  At the end of this document is a tentative outline of activities for up to 1 year.       Medication You will be discharged from the hospital with medication for pain, spasm, nausea and constipation.  You will be given enough medication to last until your first postop visit in 2 weeks.  Medications WILL NOT BE REFILLED EARLY; therefore, you are to take the medications only as directed.  If you have been given multiple prescriptions, please leave them with your pharmacy.  They can keep them on file for when you need them.  Medications that are lost or stolen WILL NOT be replaced.  We will address the need for continuing certain medications on an individual basis during your postop visit.  We ask that you avoid over the counter anti-inflammatory medications (Advil, Aleve, Motrin) for 3 months.    What you can expect following neck surgery... It is not uncommon to experience a sore throat or difficulty swallowing following neck surgery.  Cold liquids and soft foods are helpful in soothing this discomfort.  There is no specific diet that you are to follow after surgery, however, there are a  few things you should keep in mind to avoid unneeded discomfort.  Take small bites and eat slowly.  Chew your food thoroughly before swallowing.   It is not uncommon to experience incisional soreness or pain in the back of the neck, shoulders or between the shoulder blades.  These symptoms will slowly begin to resolve as you continue to recover, however, they can last for a few weeks.    It is not uncommon to experience INTERMITTENT arm pain following surgery.  This pain can mimic the arm pain you had prior to surgery.  As long as the pain resolves on its own and is not constant, there is no need to become alarmed.    When To Call If you experience fever >101F, loss of bowel or bladder control, painful swelling in the lower extremities, constant (unresolving) arm pain.  If you experience any of these symptoms, please call [facility] [phone].  What's Next As mentioned earlier, you will follow up with Dr. Shon Baton in 2 weeks.  At that time, we will likely remove your stitches and discuss additional aspects of your recovery.                   ACTIVITY GUIDELINES ANTERIOR CERVICAL DISECTOMY AND FUSION  Activity Discharge 2 weeks 6 weeks 3 months 6 months 1 year  Shower 5 days        Submerge the wound  no no yes     Walking outdoors yes       Lifting 5 lbs yes       Climbing stairs yes       Cooking yes       Car rides (less than 30 minutes) yes       Car rides (greater than 30 minutes) no varies yes     Air travel no varies yes     Short outings [employer], visits, etc...) yes       School no no yes     Driving a car no no varies yes    Light upper extremity exercises no no varies yes    Stationary bike no no yes     Swimming (no diving) no no no varies yes   Vacuuming, laundry, mopping no no no varies yes   Biking outdoors no no no no varies yes  Light jogging no no no varies yes   Low impact aerobics no no no varies yes   Non-contact sports (tennis, golf) no no no varies yes   Hunting (no tree climbing) no no no varies yes   Dancing (non-gymnastics) no no no varies yes   Down-hill skiing (experienced skier) no no no no yes   Down-hill skiing (novice) no no no no yes   Cross-country skiing no no no no yes   Horseback riding (noncompetitive)  no no no no yes   Horseback riding (competitive) no no no no varies yes  Gardening/landscaping no no no varies yes   House repairs no no no varies varies yes  Lifting up to 50 lbs no no no no varies yes

## 2023-06-19 NOTE — Plan of Care (Signed)

## 2023-06-19 NOTE — Transfer of Care (Signed)
Immediate Anesthesia Transfer of Care Note  Patient: Sergio Garcia  Procedure(s) Performed: ANTERIOR CERVICAL DECOMPRESSION/DISCECTOMY FUSION 1 LEVEL (Neck)  Patient Location: PACU  Anesthesia Type:General  Level of Consciousness: awake, alert , and oriented  Airway & Oxygen Therapy: Patient Spontanous Breathing and Patient connected to face mask oxygen  Post-op Assessment: Report given to RN and Post -op Vital signs reviewed and stable  Post vital signs: Reviewed and stable  Last Vitals:  Vitals Value Taken Time  BP 124/98 06/19/23 1030  Temp 36.9 C 06/19/23 1022  Pulse 91 06/19/23 1030  Resp 22 06/19/23 1030  SpO2 100 % 06/19/23 1030  Vitals shown include unfiled device data.  Last Pain:  Vitals:   06/19/23 1022  PainSc: 0-No pain         Complications: No notable events documented.

## 2023-06-19 NOTE — H&P (Signed)
History:  Sergio Garcia is a very pleasant 48 year old gentleman with progressive neck and radicular right arm pain. We attempted to treat his pain with physical therapy but unfortunately he was discharged for failure to improve. We have also attempted to manage his pain with selective nerve root blocks/epidural injections but they did not provide any significant long-term positive results. The patient has lateral recess and foraminal stenosis due to hard disk at C5-6 causing C6 nerve compression. His clinical symptoms are consistent with C6 pain. As a result of the failure to improve with conservative management we have elected to move forward with an ACDF at C5-6.  Past Medical History:  Diagnosis Date   Anginal pain (HCC) 2024   Anxiety    COVID-19    07/04/20   Deep vein blood clot of left lower extremity (HCC) 2022   Depression    Enteritis    Gastritis    GERD (gastroesophageal reflux disease)    Gilbert's syndrome    hyperbilirubinemia   IBS (irritable bowel syndrome)    Inguinal hernia    Insomnia    Peripheral vascular disease (HCC)    Pre-diabetes    Prostatitis 2008   Pulmonary embolism (HCC) 2022    Allergies  Allergen Reactions   Morphine And Codeine Anaphylaxis   Testosterone     Dvt/PE   Morphine Nausea And Vomiting   Tdap [Tetanus-Diphth-Acell Pertussis] Hives and Rash    No current facility-administered medications on file prior to encounter.   Current Outpatient Medications on File Prior to Encounter  Medication Sig Dispense Refill   ibuprofen (ADVIL) 200 MG tablet Take 400 mg by mouth every 6 (six) hours as needed for moderate pain (pain score 4-6) or headache.     nortriptyline (PAMELOR) 50 MG capsule Take 50 mg by mouth 2 (two) times daily.     QUEtiapine (SEROQUEL) 50 MG tablet Take 50 mg by mouth at bedtime.     traZODone (DESYREL) 50 MG tablet Take 0.5-1 tablets (25-50 mg total) by mouth at bedtime as needed. for sleep 90 tablet 1   VIBERZI 75 MG TABS  Take 1 tablet (75 mg total) by mouth 2 (two) times daily. (Patient taking differently: Take 75 mg by mouth 2 (two) times daily as needed (IBS).) 180 tablet 0   ondansetron (ZOFRAN-ODT) 4 MG disintegrating tablet Take 1 tablet (4 mg total) by mouth every 8 (eight) hours as needed for nausea or vomiting. (Patient not taking: Reported on 06/08/2023) 20 tablet 0    Physical Exam: Vitals:   06/19/23 0550  BP: (!) 132/101  Pulse: 85  Resp: 18  Temp: 97.8 F (36.6 C)   Body mass index is 25.04 kg/m. Clinical exam: Sergio Garcia is a pleasant individual, who appears younger than their stated age.  He is alert and orientated 3.  No shortness of breath, chest pain.  Abdomen is soft and non-tender, negative loss of bowel and bladder control, no rebound tenderness.  Negative: skin lesions abrasions contusions  Peripheral pulses: 2+ peripheral pulses bilaterally. LE compartments are: Soft and nontender.  Gait pattern: Normal  Assistive devices: None  Neuro: 5/5 motor strength in the upper extremity bilaterally. Negative Hoffman test, negative Babinski test, 1+ deep tendon reflexes. Intermittent numbness and dysesthesias into the C6 and C7 dermatome on the right side.  Musculoskeletal: Moderate neck pain with palpation and range of motion. Pain predominantly sweeps into the right trapezius and scapular region.  Imaging: X-rays of the cervical spine demonstrate some  mild multilevel degenerative facet arthrosis. No significant collapse of the disc space is noted.  Cervical MRI: completed on 03/31/2023. No cord signal changes. Moderate facet arthropathy at C5-6 and degenerative disc disease contributing to form severe right foraminal stenosis with compression of the C6 nerve root. Mild foraminal stenosis C4-5 and C6-7.  Image: ECHOCARDIOGRAM COMPLETE Result Date: 05/25/2023    ECHOCARDIOGRAM REPORT   Patient Name:   Sergio Garcia Date of Exam: 05/25/2023 Medical Rec #:  [id number]      Height:        74.0 in Accession #:    [id number]     Weight:       193.0 lb Date of Birth:  1975/06/05       BSA:          2.141 m Patient Age:    47 years       BP:           124/74 mmHg Patient Gender: M              HR:           76 bpm. Exam Location:  [address] Procedure: 2D Echo, Cardiac Doppler, Color Doppler, 3D Echo and Strain Analysis Indications:    R07.9* Chest pain, unspecified  History:        Patient has prior history of Echocardiogram examinations, most                 recent 11/15/2019. Signs/Symptoms:Shortness of Breath and                 Fatigue; Risk Factors:Family History of Coronary Artery Disease                 and Dyslipidemia. Precordial pain. PSVT. Prediabetes.                 Palpitations. Dizziness. DVT/pulmonary embolism.  Sonographer:    Cathie Beams RCS Referring Phys: [id number] CADENCE H FURTH IMPRESSIONS  1. Left ventricular ejection fraction, by estimation, is 55%-60%. The left ventricle has normal function. The left ventricle has no regional wall motion abnormalities. Left ventricular diastolic parameters were normal. GLS -21.8%.  2. Right ventricular systolic function is low normal. The right ventricular size is normal. The RV apex appears mildly hypokinetic.  3. The mitral valve is normal in structure. No evidence of mitral valve regurgitation. No evidence of mitral stenosis.  4. The aortic valve is normal in structure. Aortic valve regurgitation is trivial. No aortic stenosis is present.  5. Aortic dilatation noted. There is borderline dilatation of the ascending aorta, measuring 38 mm. There is borderline dilatation of the aortic root, measuring 40 mm.  6. The inferior vena cava is normal in size with greater than 50% respiratory variability, suggesting right atrial pressure of 3 mmHg. FINDINGS  Left Ventricle: Left ventricular ejection fraction, by estimation, is 55 to 60%. The left ventricle has normal function. The left ventricle has no regional wall motion abnormalities. The left  ventricular internal cavity size was normal in size. There is  no left ventricular hypertrophy. Left ventricular diastolic parameters were normal. Right Ventricle: The right ventricular size is normal. No increase in right ventricular wall thickness. Right ventricular systolic function is low normal. Left Atrium: Left atrial size was normal in size. Right Atrium: Right atrial size was normal in size. Pericardium: There is no evidence of pericardial effusion. Mitral Valve: The mitral valve is normal in structure. No evidence of mitral valve regurgitation. No  evidence of mitral valve stenosis. Tricuspid Valve: The tricuspid valve is normal in structure. Tricuspid valve regurgitation is trivial. No evidence of tricuspid stenosis. Aortic Valve: The aortic valve is normal in structure. Aortic valve regurgitation is trivial. No aortic stenosis is present. Pulmonic Valve: The pulmonic valve was normal in structure. Pulmonic valve regurgitation is trivial. No evidence of pulmonic stenosis. Aorta: Aortic dilatation noted. There is borderline dilatation of the ascending aorta, measuring 38 mm. There is borderline dilatation of the aortic root, measuring 40 mm. Venous: The inferior vena cava is normal in size with greater than 50% respiratory variability, suggesting right atrial pressure of 3 mmHg. IAS/Shunts: No atrial level shunt detected by color flow Doppler.  LEFT VENTRICLE PLAX 2D LVIDd:         4.10 cm   Diastology LVIDs:         2.70 cm   LV e' medial:    6.96 cm/s LV PW:         1.10 cm   LV E/e' medial:  6.6 LV IVS:        1.00 cm   LV e' lateral:   8.92 cm/s LVOT diam:     2.00 cm   LV E/e' lateral: 5.2 LV SV:         41 LV SV Index:   19 LVOT Area:     3.14 cm                           3D Volume EF:                          3D EF:        57 %                          LV EDV:       111 ml                          LV ESV:       48 ml                          LV SV:        64 ml RIGHT VENTRICLE RV Basal diam:  2.90  cm RV S prime:     11.20 cm/s TAPSE (M-mode): 1.6 cm LEFT ATRIUM             Index        RIGHT ATRIUM           Index LA diam:        3.30 cm 1.54 cm/m   RA Area:     11.10 cm LA Vol (A2C):   33.4 ml 15.60 ml/m  RA Volume:   25.10 ml  11.72 ml/m LA Vol (A4C):   18.0 ml 8.41 ml/m LA Biplane Vol: 24.6 ml 11.49 ml/m  AORTIC VALVE LVOT Vmax:   75.00 cm/s LVOT Vmean:  52.200 cm/s LVOT VTI:    0.132 m  AORTA Ao Root diam: 4.00 cm Ao Asc diam:  3.80 cm MITRAL VALVE MV Area (PHT): 6.65 cm    SHUNTS MV Decel Time: 114 msec    Systemic VTI:  0.13 m MV E velocity: 46.00 cm/s  Systemic Diam: 2.00 cm MV A velocity: 59.90 cm/s MV E/A ratio:  0.77 Aditya Sabharwal Electronically signed by Dorthula Nettles Signature Date/Time: 05/25/2023/6:51:59 PM    Final     A/P:  Sergio Garcia is a very pleasant 48 year old gentleman with ongoing neck and radicular right arm pain. Clinical exam is consistent with C6 upper extremity dysesthesias and neuropathic pain. He has no signs of cervical myelopathy. Imaging studies confirm C5-6 degenerative disc disease causing foraminal and lateral recess stenosis affecting the right C6 nerve root.  Treatment to date has included physical therapy which she has failed. He was discharged on 04/05/2023 from physical therapy. He has had most recently selective nerve root block which did not provide any significant improvement. As a result of having clinical signs and symptoms of right C6 radiculopathy consistent with his cervical MRI and the failure of conservative management he is elected to move forward with surgery. At this point I think the single level ACDF is reasonable.  Risks and benefits of surgery were discussed with the patient. These include: Infection, bleeding, death, stroke, paralysis, ongoing or worse pain, need for additional surgery, nonunion, leak of spinal fluid, adjacent segment degeneration requiring additional fusion surgery. Pseudoarthrosis (nonunion)requiring supplemental  posterior fixation. Throat pain, swallowing difficulties, hoarseness or change in voice.  Surgical plan: ACDF C5-6. Implants: NuVasive anterior cervical plate. Titan intervertebral cage. Graft: DBX mix

## 2023-06-19 NOTE — Brief Op Note (Signed)
06/19/2023  10:38 AM  PATIENT:  Sergio Garcia  48 y.o. male  PRE-OPERATIVE DIAGNOSIS:  cervical radiculopathy  POST-OPERATIVE DIAGNOSIS:  cervical radiculopathy  PROCEDURE:  Procedure(s) with comments: ANTERIOR CERVICAL DECOMPRESSION/DISCECTOMY FUSION 1 LEVEL (N/A) - 150 min  SURGEON:  Surgeons and Role:    Venita Lick, MD - Primary  PHYSICIAN ASSISTANT:   ASSISTANTS: Luther Bradley   ANESTHESIA:   general  EBL:  20 mL   BLOOD ADMINISTERED:none  DRAINS: none   LOCAL MEDICATIONS USED:  MARCAINE     SPECIMEN:  No Specimen  DISPOSITION OF SPECIMEN:  N/A  COUNTS:  YES  TOURNIQUET:  * No tourniquets in log *  DICTATION: .Dragon Dictation  PLAN OF CARE: Admit for overnight observation  PATIENT DISPOSITION:  PACU - hemodynamically stable.

## 2023-06-20 ENCOUNTER — Encounter (HOSPITAL_COMMUNITY): Payer: Self-pay | Admitting: Orthopedic Surgery

## 2023-06-20 DIAGNOSIS — M4722 Other spondylosis with radiculopathy, cervical region: Secondary | ICD-10-CM | POA: Diagnosis not present

## 2023-06-20 NOTE — Discharge Summary (Signed)
Patient ID: Sergio Garcia MRN: [id number] DOB/AGE: 05-25-1975 47 y.o.  Admit date: 06/19/2023 Discharge date: 06/20/2023  Admission Diagnoses:  Principal Problem:   Cervical disc herniation   Discharge Diagnoses:  Principal Problem:   Cervical disc herniation  status post Procedure(s): ANTERIOR CERVICAL DECOMPRESSION/DISCECTOMY FUSION 1 LEVEL  Past Medical History:  Diagnosis Date   Anginal pain (HCC) 2024   Anxiety    COVID-19    07/04/20   Deep vein blood clot of left lower extremity (HCC) 2022   Depression    Enteritis    Gastritis    GERD (gastroesophageal reflux disease)    Gilbert's syndrome    hyperbilirubinemia   IBS (irritable bowel syndrome)    Inguinal hernia    Insomnia    Peripheral vascular disease (HCC)    Pre-diabetes    Prostatitis 2008   Pulmonary embolism (HCC) 2022    Surgeries: Procedure(s): ANTERIOR CERVICAL DECOMPRESSION/DISCECTOMY FUSION 1 LEVEL on 06/19/2023   Consultants:   Discharged Condition: Improved  Hospital Course: Sergio Garcia is an 48 y.o. male who was admitted 06/19/2023 for operative treatment of Cervical disc herniation. Patient failed conservative treatments (please see the history and physical for the specifics) and had severe unremitting pain that affects sleep, daily activities and work/hobbies. After pre-op clearance, the patient was taken to the operating room on 06/19/2023 and underwent  Procedure(s): ANTERIOR CERVICAL DECOMPRESSION/DISCECTOMY FUSION 1 LEVEL.    Patient was given perioperative antibiotics:  Anti-infectives (From admission, onward)    Start     Dose/Rate Route Frequency Ordered Stop   06/19/23 1600  ceFAZolin (ANCEF) IVPB 1 g/50 mL premix        1 g 100 mL/hr over 30 Minutes Intravenous Every 8 hours 06/19/23 1122 06/19/23 2309   06/19/23 0543  ceFAZolin (ANCEF) IVPB 2g/100 mL premix        2 g 200 mL/hr over 30 Minutes Intravenous 30 min pre-op 06/19/23 0543 06/19/23 0818        Patient was  given sequential compression devices and early ambulation to prevent DVT.   Patient benefited maximally from hospital stay and there were no complications. At the time of discharge, the patient was urinating/moving their bowels without difficulty, tolerating a regular diet, pain is controlled with oral pain medications and they have been cleared by PT/OT.   Recent vital signs: Patient Vitals for the past 24 hrs:  BP Temp Temp src Pulse Resp SpO2  06/20/23 0728 125/87 98.2 F (36.8 C) Oral 95 18 96 %  06/20/23 0513 138/83 98.1 F (36.7 C) Oral 88 20 97 %  06/19/23 2312 (!) 137/90 98.1 F (36.7 C) Oral 100 20 95 %  06/19/23 1957 135/84 97.7 F (36.5 C) Oral (!) 108 -- 96 %  06/19/23 1540 119/78 97.7 F (36.5 C) Oral 88 20 97 %  06/19/23 1150 (!) 126/99 97.7 F (36.5 C) -- 90 20 97 %  06/19/23 1130 (!) 121/94 98.2 F (36.8 C) -- 88 19 95 %  06/19/23 1115 121/81 -- -- 84 16 93 %  06/19/23 1100 112/82 -- -- 87 15 94 %  06/19/23 1045 123/88 -- -- 88 19 93 %  06/19/23 1030 (!) 124/98 -- -- 91 (!) 22 100 %  06/19/23 1022 (!) 122/111 98.5 F (36.9 C) -- 89 17 94 %     Recent laboratory studies: No results for input(s): "WBC", "HGB", "HCT", "PLT", "NA", "K", "CL", "CO2", "BUN", "CREATININE", "GLUCOSE", "INR", "CALCIUM" in the last 72 hours.  Invalid input(s): "PT", "2"   Discharge Medications:   Allergies as of 06/20/2023       Reactions   Morphine And Codeine Anaphylaxis   Testosterone    Dvt/PE   Morphine Nausea And Vomiting   Tdap [tetanus-diphth-acell Pertussis] Hives, Rash        Medication List     STOP taking these medications    ibuprofen 200 MG tablet Commonly known as: ADVIL   ondansetron 4 MG disintegrating tablet Commonly known as: ZOFRAN-ODT       TAKE these medications    methocarbamol 500 MG tablet Commonly known as: ROBAXIN Take 1 tablet (500 mg total) by mouth every 8 (eight) hours as needed for up to 5 days for muscle spasms.   nortriptyline 50  MG capsule Commonly known as: PAMELOR Take 50 mg by mouth 2 (two) times daily.   ondansetron 4 MG tablet Commonly known as: Zofran Take 1 tablet (4 mg total) by mouth every 8 (eight) hours as needed for nausea or vomiting.   oxyCODONE-acetaminophen 10-325 MG tablet Commonly known as: Percocet Take 1 tablet by mouth every 6 (six) hours as needed for up to 5 days for pain.   QUEtiapine 50 MG tablet Commonly known as: SEROQUEL Take 50 mg by mouth at bedtime.   traZODone 50 MG tablet Commonly known as: DESYREL Take 0.5-1 tablets (25-50 mg total) by mouth at bedtime as needed. for sleep   Viberzi 75 MG Tabs Generic drug: Eluxadoline Take 1 tablet (75 mg total) by mouth 2 (two) times daily. What changed:  when to take this reasons to take this        Diagnostic Studies: DG Cervical Spine 2 or 3 views Result Date: 06/19/2023 CLINICAL DATA:  Anterior cervical fusion. EXAM: CERVICAL SPINE - 2-3 VIEW COMPARISON:  None Available. FINDINGS: Two intraoperative fluoroscopic spot images provided. The total fluoroscopic time is 1 minutes, 3 seconds with a cumulative air Karma of 7.76 mGy. C5-C6 ACDF. IMPRESSION: C5-C6 ACDF. Electronically Signed   By: Elgie Collard M.D.   On: 06/19/2023 14:11   DG C-Arm 1-60 Min-No Report Result Date: 06/19/2023 Fluoroscopy was utilized by the requesting physician.  No radiographic interpretation.   DG C-Arm 1-60 Min-No Report Result Date: 06/19/2023 Fluoroscopy was utilized by the requesting physician.  No radiographic interpretation.   DG C-Arm 1-60 Min-No Report Result Date: 06/19/2023 Fluoroscopy was utilized by the requesting physician.  No radiographic interpretation.   ECHOCARDIOGRAM COMPLETE Result Date: 05/25/2023    ECHOCARDIOGRAM REPORT   Patient Name:   Sergio Garcia Date of Exam: 05/25/2023 Medical Rec #:  [id number]      Height:       74.0 in Accession #:    [id number]     Weight:       193.0 lb Date of Birth:  [birth year]       BSA:           2.141 m Patient Age:    47 years       BP:           124/74 mmHg Patient Gender: M              HR:           76 bpm. Exam Location:  [address] Procedure: 2D Echo, Cardiac Doppler, Color Doppler, 3D Echo and Strain Analysis Indications:    R07.9* Chest pain, unspecified  History:        Patient has prior history of Echocardiogram  examinations, most                 recent 11/15/2019. Signs/Symptoms:Shortness of Breath and                 Fatigue; Risk Factors:Family History of Coronary Artery Disease                 and Dyslipidemia. Precordial pain. PSVT. Prediabetes.                 Palpitations. Dizziness. DVT/pulmonary embolism.  Sonographer:    Cathie Beams RCS Referring Phys: [id number] CADENCE H FURTH IMPRESSIONS  1. Left ventricular ejection fraction, by estimation, is 55%-60%. The left ventricle has normal function. The left ventricle has no regional wall motion abnormalities. Left ventricular diastolic parameters were normal. GLS -21.8%.  2. Right ventricular systolic function is low normal. The right ventricular size is normal. The RV apex appears mildly hypokinetic.  3. The mitral valve is normal in structure. No evidence of mitral valve regurgitation. No evidence of mitral stenosis.  4. The aortic valve is normal in structure. Aortic valve regurgitation is trivial. No aortic stenosis is present.  5. Aortic dilatation noted. There is borderline dilatation of the ascending aorta, measuring 38 mm. There is borderline dilatation of the aortic root, measuring 40 mm.  6. The inferior vena cava is normal in size with greater than 50% respiratory variability, suggesting right atrial pressure of 3 mmHg. FINDINGS  Left Ventricle: Left ventricular ejection fraction, by estimation, is 55 to 60%. The left ventricle has normal function. The left ventricle has no regional wall motion abnormalities. The left ventricular internal cavity size was normal in size. There is  no left ventricular hypertrophy. Left  ventricular diastolic parameters were normal. Right Ventricle: The right ventricular size is normal. No increase in right ventricular wall thickness. Right ventricular systolic function is low normal. Left Atrium: Left atrial size was normal in size. Right Atrium: Right atrial size was normal in size. Pericardium: There is no evidence of pericardial effusion. Mitral Valve: The mitral valve is normal in structure. No evidence of mitral valve regurgitation. No evidence of mitral valve stenosis. Tricuspid Valve: The tricuspid valve is normal in structure. Tricuspid valve regurgitation is trivial. No evidence of tricuspid stenosis. Aortic Valve: The aortic valve is normal in structure. Aortic valve regurgitation is trivial. No aortic stenosis is present. Pulmonic Valve: The pulmonic valve was normal in structure. Pulmonic valve regurgitation is trivial. No evidence of pulmonic stenosis. Aorta: Aortic dilatation noted. There is borderline dilatation of the ascending aorta, measuring 38 mm. There is borderline dilatation of the aortic root, measuring 40 mm. Venous: The inferior vena cava is normal in size with greater than 50% respiratory variability, suggesting right atrial pressure of 3 mmHg. IAS/Shunts: No atrial level shunt detected by color flow Doppler.  LEFT VENTRICLE PLAX 2D LVIDd:         4.10 cm   Diastology LVIDs:         2.70 cm   LV e' medial:    6.96 cm/s LV PW:         1.10 cm   LV E/e' medial:  6.6 LV IVS:        1.00 cm   LV e' lateral:   8.92 cm/s LVOT diam:     2.00 cm   LV E/e' lateral: 5.2 LV SV:         41 LV SV Index:   19 LVOT Area:  3.14 cm                           3D Volume EF:                          3D EF:        57 %                          LV EDV:       111 ml                          LV ESV:       48 ml                          LV SV:        64 ml RIGHT VENTRICLE RV Basal diam:  2.90 cm RV S prime:     11.20 cm/s TAPSE (M-mode): 1.6 cm LEFT ATRIUM             Index        RIGHT ATRIUM            Index LA diam:        3.30 cm 1.54 cm/m   RA Area:     11.10 cm LA Vol (A2C):   33.4 ml 15.60 ml/m  RA Volume:   25.10 ml  11.72 ml/m LA Vol (A4C):   18.0 ml 8.41 ml/m LA Biplane Vol: 24.6 ml 11.49 ml/m  AORTIC VALVE LVOT Vmax:   75.00 cm/s LVOT Vmean:  52.200 cm/s LVOT VTI:    0.132 m  AORTA Ao Root diam: 4.00 cm Ao Asc diam:  3.80 cm MITRAL VALVE MV Area (PHT): 6.65 cm    SHUNTS MV Decel Time: 114 msec    Systemic VTI:  0.13 m MV E velocity: 46.00 cm/s  Systemic Diam: 2.00 cm MV A velocity: 59.90 cm/s MV E/A ratio:  0.77 Aditya Sabharwal Electronically signed by Dorthula Nettles Signature Date/Time: 05/25/2023/6:51:59 PM    Final     Discharge Instructions     Incentive spirometry RT   Complete by: As directed         Follow-up Information     Venita Lick, MD. Schedule an appointment as soon as possible for a visit in 2 week(s).   Specialty: Orthopedic Surgery Why: If symptoms worsen, For suture removal, For wound re-check Contact information: [address] [place] [address] [phone]                 Discharge Plan:  discharge to home  Disposition: Debria Garret is a very pleasant 48 year old gentleman who underwent a ACDF yesterday.  Patient surgery was uneventful.  Postoperatively he has been ambulating without difficulty, tolerating a regular diet, and voiding spontaneously.  Pain has been well-controlled.  Patient will be discharged home today.  Appropriate instructions and medications have been provided.  He will follow-up with me in 2 weeks for reevaluation.    Signed: Alvy Beal for Dr. Venita Lick [facility] [phone] 06/20/2023, 8:27 AM

## 2023-06-20 NOTE — Progress Notes (Signed)
Patient alert and oriented, voiding adequately, skin clean, dry and intact without evidence of skin break down, or symptoms of complications - no redness or edema noted, only slight tenderness at site.  Patient states pain is manageable at time of discharge. Room was checked and accounted for all patient's belongings; discharge instructions concerning her medications, incision care, follow up appointment and when to call the doctor as needed were all discussed with patient by RN and he expressed understanding on the instructions given.

## 2023-06-20 NOTE — Evaluation (Signed)
Physical Therapy Evaluation and Discharge  Patient Details Name: Sergio Garcia MRN: [id number] DOB: 1976/03/18 Today's Date: 06/20/2023  History of Present Illness  Pt is a 48 y/o male who presents s/p C5-C6 ACDF on 06/19/2023. PMH significant for DVT, enteritis, Gilbert's syndrome, PVD, pre-diabetes, prostatitis, PE, prior back surgery.   Clinical Impression  Patient evaluated by Physical Therapy with no further acute PT needs identified. All education has been completed and the patient has no further questions. Pt was able to demonstrate transfers and ambulation with gross modified independence to independence and no AD. Pt was educated on precautions, brace application/wearing schedule, appropriate activity progression, and car transfer. See below for any follow-up Physical Therapy or equipment needs. PT is signing off. Thank you for this referral.         If plan is discharge home, recommend the following:     Can travel by private vehicle        Equipment Recommendations None recommended by PT  Recommendations for Other Services       Functional Status Assessment Patient has had a recent decline in their functional status and demonstrates the ability to make significant improvements in function in a reasonable and predictable amount of time.     Precautions / Restrictions Precautions Precautions: Fall;Cervical Precaution Booklet Issued: Yes (comment) Precaution Comments: Reviewed handout and pt was cued for precautions during functional mobility. Required Braces or Orthoses: Cervical Brace Cervical Brace: Hard collar;Soft collar (Both present in room. Per pt, MD states he can use whichever he would like.) Restrictions Weight Bearing Restrictions Per Provider Order: No      Mobility  Bed Mobility Overal bed mobility: Independent                  Transfers Overall transfer level: Independent Equipment used: None                     Ambulation/Gait Ambulation/Gait assistance: Independent Gait Distance (Feet): 560 Feet Assistive device: None Gait Pattern/deviations: WFL(Within Functional Limits)       General Gait Details: Pt without unsteadiness or LOB. No assist required.  Stairs Stairs: Yes Stairs assistance: Modified independent (Device/Increase time) Stair Management: One rail Right Number of Stairs: 3 General stair comments: VC's for sequencing and general safety.  Wheelchair Mobility     Tilt Bed    Modified Rankin (Stroke Patients Only)       Balance Overall balance assessment: Independent                                           Pertinent Vitals/Pain Pain Assessment Pain Assessment: 0-10 Pain Score: 0-No pain Pain Intervention(s): Monitored during session    Home Living Family/patient expects to be discharged to:: Private residence Living Arrangements: Spouse/significant other;Children Available Help at Discharge: Family Type of Home: House Home Access: Stairs to enter Entrance Stairs-Rails: Right Entrance Stairs-Number of Steps: 2   Home Layout: One level Home Equipment: Shower seat - built in;Hand held shower head      Prior Function Prior Level of Function : Independent/Modified Independent                     Extremity/Trunk Assessment   Upper Extremity Assessment Upper Extremity Assessment: Overall WFL for tasks assessed    Lower Extremity Assessment Lower Extremity Assessment: Overall WFL for tasks assessed  Cervical / Trunk Assessment Cervical / Trunk Assessment: Neck Surgery  Communication   Communication Communication: No apparent difficulties Cueing Techniques: Verbal cues  Cognition Arousal: Alert Behavior During Therapy: WFL for tasks assessed/performed Overall Cognitive Status: Within Functional Limits for tasks assessed                                          General Comments      Exercises      Assessment/Plan    PT Assessment Patient does not need any further PT services  PT Problem List         PT Treatment Interventions      PT Goals (Current goals can be found in the Care Plan section)  Acute Rehab PT Goals Patient Stated Goal: Home today PT Goal Formulation: All assessment and education complete, DC therapy    Frequency       Co-evaluation               AM-PAC PT "6 Clicks" Mobility  Outcome Measure Help needed turning from your back to your side while in a flat bed without using bedrails?: None Help needed moving from lying on your back to sitting on the side of a flat bed without using bedrails?: None Help needed moving to and from a bed to a chair (including a wheelchair)?: None Help needed standing up from a chair using your arms (e.g., wheelchair or bedside chair)?: None Help needed to walk in hospital room?: None Help needed climbing 3-5 steps with a railing? : None 6 Click Score: 24    End of Session Equipment Utilized During Treatment: Gait belt;Cervical collar Activity Tolerance: Patient tolerated treatment well Patient left: in bed;with call bell/phone within reach;with family/visitor present Nurse Communication: Mobility status PT Visit Diagnosis: Unsteadiness on feet (R26.81);Difficulty in walking, not elsewhere classified (R26.2)    Time: 1610-9604 PT Time Calculation (min) (ACUTE ONLY): 16 min   Charges:   PT Evaluation $PT Eval Low Complexity: 1 Low   PT General Charges $$ ACUTE PT VISIT: 1 Visit         Conni Slipper, PT, DPT Acute Rehabilitation Services Secure Chat Preferred Office: [phone]   Marylynn Pearson 06/20/2023, 9:27 AM

## 2023-06-20 NOTE — Plan of Care (Signed)

## 2023-06-20 NOTE — Progress Notes (Signed)
Orthopedic Tech Progress Note Patient Details:  Sergio Garcia 1975-08-16 [id number]  Ortho Devices Type of Ortho Device: Soft collar Ortho Device/Splint Location: NECK Ortho Device/Splint Interventions: Ordered, Application, Adjustment   Post Interventions Patient Tolerated: Well Instructions Provided: Care of device  [name] 06/20/2023, 8:35 AM

## 2023-06-21 NOTE — Anesthesia Postprocedure Evaluation (Signed)
Anesthesia Post Note  Patient: Sergio Garcia  Procedure(s) Performed: ANTERIOR CERVICAL DECOMPRESSION/DISCECTOMY FUSION 1 LEVEL (Neck)     Patient location during evaluation: PACU Anesthesia Type: General Level of consciousness: awake and alert Pain management: pain level controlled Vital Signs Assessment: post-procedure vital signs reviewed and stable Respiratory status: spontaneous breathing, nonlabored ventilation and respiratory function stable Cardiovascular status: blood pressure returned to baseline and stable Postop Assessment: no apparent nausea or vomiting Anesthetic complications: no   No notable events documented.                  Betha Shadix

## 2023-11-08 ENCOUNTER — Ambulatory Visit: Admitting: Family Medicine

## 2023-11-08 ENCOUNTER — Encounter: Payer: Self-pay | Admitting: Family Medicine

## 2023-11-08 VITALS — BP 122/89 | HR 76 | Resp 14 | Ht 74.0 in | Wt 191.2 lb

## 2023-11-08 DIAGNOSIS — K580 Irritable bowel syndrome with diarrhea: Secondary | ICD-10-CM | POA: Diagnosis not present

## 2023-11-08 DIAGNOSIS — K219 Gastro-esophageal reflux disease without esophagitis: Secondary | ICD-10-CM

## 2023-11-08 DIAGNOSIS — F4310 Post-traumatic stress disorder, unspecified: Secondary | ICD-10-CM

## 2023-11-08 DIAGNOSIS — R7303 Prediabetes: Secondary | ICD-10-CM

## 2023-11-08 DIAGNOSIS — Z0001 Encounter for general adult medical examination with abnormal findings: Secondary | ICD-10-CM | POA: Diagnosis not present

## 2023-11-08 DIAGNOSIS — F419 Anxiety disorder, unspecified: Secondary | ICD-10-CM

## 2023-11-08 DIAGNOSIS — R17 Unspecified jaundice: Secondary | ICD-10-CM

## 2023-11-08 DIAGNOSIS — E782 Mixed hyperlipidemia: Secondary | ICD-10-CM

## 2023-11-08 DIAGNOSIS — F331 Major depressive disorder, recurrent, moderate: Secondary | ICD-10-CM

## 2023-11-08 DIAGNOSIS — F5105 Insomnia due to other mental disorder: Secondary | ICD-10-CM | POA: Diagnosis not present

## 2023-11-08 DIAGNOSIS — Z0000 Encounter for general adult medical examination without abnormal findings: Secondary | ICD-10-CM

## 2023-11-08 DIAGNOSIS — M5020 Other cervical disc displacement, unspecified cervical region: Secondary | ICD-10-CM

## 2023-11-08 MED ORDER — TRAZODONE HCL 50 MG PO TABS: 25.0000 mg | ORAL_TABLET | Freq: Every evening | ORAL | 1 refills | Status: DC | PRN

## 2023-11-08 MED ORDER — NORTRIPTYLINE HCL 50 MG PO CAPS: 50.0000 mg | ORAL_CAPSULE | Freq: Two times a day (BID) | ORAL | 3 refills | Status: DC

## 2023-11-08 MED ORDER — QUETIAPINE FUMARATE 50 MG PO TABS: 50.0000 mg | ORAL_TABLET | Freq: Every day | ORAL | 3 refills | Status: DC

## 2023-11-08 NOTE — Assessment & Plan Note (Signed)
 Status post fusion in January; no problems at this time.

## 2023-11-08 NOTE — Progress Notes (Signed)
 New patient visit   Patient: Sergio Garcia   DOB: Jun 22, 1975   48 y.o. Male  MRN: [id number] Visit Date: 11/08/2023  Today's healthcare provider: LAURAINE LOISE BUOY, DO   Chief Complaint  Patient presents with   Establish Care    Previous Doctor FABIENE Laura Last colonoscopy 2019-2020 unsure Would like a new GI doctor [facility] across from [place])   Subjective    Sergio Garcia is a 48 y.o. male who presents today as a new patient to establish care.  HPI HPI     Establish Care    Additional comments: Previous Doctor FABIENE Laura Last colonoscopy 2019-2020 unsure Would like a new GI doctor [facility] across from [place])      Last edited by Wilfred Hargis RAMAN, CMA on 11/08/2023  8:03 AM.      Sergio Garcia Ned is a 48 year old male with irritable bowel syndrome who presents for a transfer of care and GI consultation.  He is seeking a new gastroenterologist due to his previous doctor ceasing practice. He prefers a GI doctor located in the office across from [facility], where his cousin also receives care.  He has a history of irritable bowel syndrome (IBS) with predominant diarrhea and reflux. He is currently taking Viberzi  once a day, which sometimes alleviates symptoms but can also cause constipation. He has Zofran  available at home for nausea.  He has a past medical history of blood clots in his leg and lungs, which were discovered simultaneously when he experienced breathing difficulties. A CT scan of his chest confirmed the diagnosis. He was treated with Xarelto  for a year and now undergoes annual scans. No specific cause for the clots was identified.  He has high cholesterol and is prediabetic, though he is just below the threshold for diabetes. He walks 10,000 to 11,000 steps a day at work but admits to a diet that is 'kind of more on the unhealthy side.'  He has a long-standing history of elevated bilirubin, first noted in the mid-2000s when he was hospitalized  for severe abdominal pain. 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  He has a history of depression, PTSD, and anxiety, for which he takes nortriptyline  and Seroquel . He also uses trazodone  as needed at bedtime, which helps with sleep. He experiences occasional headaches and panic attacks, which can cause palpitations.  He underwent neck fusion surgery in January. He also had low back surgery in 2009 and gallbladder surgery in 2015.     Past Medical History:  Diagnosis Date   Anginal pain (HCC) 2024   Anxiety    COVID-19    07/04/20   Deep vein blood clot of left lower extremity (HCC) 2022   Depression    Enteritis    Gastritis    GERD (gastroesophageal reflux disease)    Gilbert's syndrome    hyperbilirubinemia   IBS (irritable bowel syndrome)    Inguinal hernia    Insomnia    Peripheral vascular disease (HCC)    Pre-diabetes    Prostatitis 2008   Pulmonary embolism (HCC) 2022   Past Surgical History:  Procedure Laterality Date   ANTERIOR CERVICAL DECOMP/DISCECTOMY FUSION N/A 06/19/2023   Procedure: ANTERIOR CERVICAL DECOMPRESSION/DISCECTOMY FUSION 1 LEVEL;  Surgeon: Burnetta Aures, MD;  Location: [facility] OR;  Service: Orthopedics;  Laterality: N/A;  150 min   BACK SURGERY  2009   2 ruptured  discs   CHOLECYSTECTOMY  01/31/2014   GB polyp, chronic cholecystitis    ESOPHAGOGASTRODUODENOSCOPY  09/2004   gastritis acute and duodenitis   ESOPHAGOGASTRODUODENOSCOPY  01/14/2014   Dr Jinny   ESOPHAGOGASTRODUODENOSCOPY N/A 09/16/2021   Procedure: ESOPHAGOGASTRODUODENOSCOPY (EGD);  Surgeon: Jinny Carmine, MD;  Location: [facility] ENDOSCOPY;  Service: Endoscopy;  Laterality: N/A;   EXPLORATORY LAPAROTOMY  05/2013   Dr Wonda   LASIK Bilateral 2012   RIGHT/LEFT HEART CATH AND CORONARY ANGIOGRAPHY N/A 10/18/2019   Procedure: RIGHT/LEFT HEART CATH AND CORONARY ANGIOGRAPHY;  Surgeon: Darron Deatrice LABOR, MD;   Location: [facility] INVASIVE CV LAB;  Service: Cardiovascular;  Laterality: N/A;   Family Status  Relation Name Status   Mother  Alive   Father  Alive   Brother  Alive   MGF  (Not Specified)   PGF  Alive   Mat Uncle  (Not Specified)   Cousin  (Not Specified)   Other  (Not Specified)   Neg Hx  (Not Specified)  No partnership data on file   Family History  Problem Relation Age of Onset   Colon polyps Mother    Cancer Mother        sinus cavity, on XRT   Asthma Mother    Heart disease Father    Heart attack Father        x 2   Asthma Brother    Pancreatic cancer Maternal Grandfather    Colon cancer Maternal Grandfather    Heart Problems Paternal Grandfather    Cancer Maternal Uncle    Stroke Cousin        age 60/44   Diabetes Other        strong FH d both sides of family    Mental illness Neg Hx    Social History   Socioeconomic History   Marital status: Divorced    Spouse name: Not on file   Number of children: 2   Years of education: Not on file   Highest education level: 12th grade  Occupational History   Occupation: Sports administrator: [employer]  Tobacco Use   Smoking status: Never   Smokeless tobacco: Never  Vaping Use   Vaping status: Never Used  Substance and Sexual Activity   Alcohol use: Yes    Comment: occassional social   Drug use: No   Sexual activity: Yes  Other Topics Concern   Not on file  Social History Narrative   Married.   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   Enjoys watching racing, Curator, deer hunting.             Social Drivers of Health   Financial Resource Strain: High Risk (11/07/2023)   Overall Financial Resource Strain (CARDIA)    Difficulty of Paying Living Expenses: Hard  Food Insecurity: Food Insecurity Present (11/07/2023)   Hunger Vital Sign    Worried About Running Out of Food in the Last Year: Often true    Ran Out of Food in the Last Year: Often true  Transportation  Needs: No Transportation Needs (11/07/2023)   PRAPARE - Administrator, Civil Service (Medical): No    Lack of Transportation (Non-Medical): No  Physical Activity: Sufficiently Active (11/07/2023)   Exercise Vital Sign    Days of Exercise per Week: 5 days    Minutes of Exercise per Session: 150+ min  Stress: Stress Concern Present (11/07/2023)   [employer]  of Occupational Health - Occupational Stress Questionnaire    Feeling of Stress: Very much  Social Connections: Socially Integrated (11/07/2023)   Social Connection and Isolation Panel    Frequency of Communication with Friends and Family: More than three times a week    Frequency of Social Gatherings with Friends and Family: Once a week    Attends Religious Services: More than 4 times per year    Active Member of [employer] or Organizations: Yes    Attends Engineer, structural: More than 4 times per year    Marital Status: Married   Outpatient Medications Prior to Visit  Medication Sig   ondansetron  (ZOFRAN ) 4 MG tablet Take 1 tablet (4 mg total) by mouth every 8 (eight) hours as needed for nausea or vomiting.   VIBERZI  75 MG TABS Take 1 tablet (75 mg total) by mouth 2 (two) times daily. (Patient taking differently: Take 75 mg by mouth 2 (two) times daily as needed (IBS).)   [DISCONTINUED] nortriptyline  (PAMELOR ) 50 MG capsule Take 50 mg by mouth 2 (two) times daily.   [DISCONTINUED] QUEtiapine  (SEROQUEL ) 50 MG tablet Take 50 mg by mouth at bedtime.   [DISCONTINUED] traZODone  (DESYREL ) 50 MG tablet Take 0.5-1 tablets (25-50 mg total) by mouth at bedtime as needed. for sleep   No facility-administered medications prior to visit.   Allergies  Allergen Reactions   Morphine And Codeine Anaphylaxis   Testosterone     Dvt/PE   Morphine Nausea And Vomiting   Tdap [Tetanus-Diphth-Acell Pertussis] Hives and Rash    Immunization History  Administered Date(s) Administered   Hepatitis B, ADULT 09/14/2017, 10/12/2017,  01/04/2018   Influenza Split 06/20/2011   Influenza,inj,Quad PF,6+ Mos 02/16/2015, 02/17/2016, 01/24/2018, 01/23/2019, 04/07/2021, 02/11/2022   Influenza-Unspecified 03/15/2013, 01/24/2018, 04/15/2020   PFIZER(Purple Top)SARS-COV-2 Vaccination 08/09/2019, 08/30/2019, 04/15/2020   PPD Test 09/11/2017   Td 05/08/2003   Tdap 06/20/2011, 01/04/2018    Health Maintenance  Topic Date Due   HPV VACCINES (1 - 3-dose SCDM series) Never done   Colonoscopy  12/24/2022   COVID-19 Vaccine (4 - 2024-25 season) 01/22/2023   INFLUENZA VACCINE  12/22/2023   DTaP/Tdap/Td (4 - Td or Tdap) 01/05/2028   Hepatitis B Vaccines  Completed   Hepatitis C Screening  Completed   HIV Screening  Completed   Meningococcal B Vaccine  Aged Out    Patient Care Team: Johnda Billiot, Lauraine SAILOR, DO as PCP - General (Family Medicine) Darron Deatrice LABOR, MD as PCP - Cardiology (Cardiology) Aneita Gwendlyn DASEN, MD (Inactive) (Gastroenterology) Vannie Delon LABOR, MD (Internal Medicine) Dessa Reyes ORN, MD (General Surgery) Antonette Angeline ORN, NP as Nurse Practitioner (Internal Medicine)  Review of Systems  Constitutional:  Negative for appetite change, chills, fatigue and fever.  HENT:  Negative for congestion, ear pain, hearing loss, nosebleeds and trouble swallowing.   Eyes:  Negative for pain and visual disturbance.  Respiratory:  Negative for cough, chest tightness and shortness of breath.   Cardiovascular:  Negative for chest pain, palpitations and leg swelling.  Gastrointestinal:  Positive for diarrhea (baseline - IBS). Negative for abdominal pain, blood in stool, constipation, nausea and vomiting.  Endocrine: Negative for polydipsia, polyphagia and polyuria.  Genitourinary:  Negative for dysuria and flank pain.  Musculoskeletal:  Negative for arthralgias, back pain, joint swelling, myalgias and neck stiffness.  Skin:  Negative for color change, rash and wound.  Neurological:  Negative for dizziness, tremors, seizures,  speech difficulty, weakness, light-headedness and headaches.  Psychiatric/Behavioral:  Negative for behavioral  problems, confusion, decreased concentration, dysphoric mood and sleep disturbance. The patient is not nervous/anxious.   All other systems reviewed and are negative.       Objective    BP 122/89 (BP Location: Left Arm, Patient Position: Sitting, Cuff Size: Normal)   Pulse 76   Resp 14   Ht 6' 2 (1.88 m)   Wt 191 lb 3.2 oz (86.7 kg)   SpO2 100%   BMI 24.55 kg/m     Physical Exam Vitals and nursing note reviewed.  Constitutional:      General: He is awake.     Appearance: Normal appearance.  HENT:     Head: Normocephalic and atraumatic.     Right Ear: Tympanic membrane, ear canal and external ear normal.     Left Ear: Tympanic membrane, ear canal and external ear normal.     Nose: Nose normal.     Mouth/Throat:     Mouth: Mucous membranes are moist.     Pharynx: Oropharynx is clear. No oropharyngeal exudate or posterior oropharyngeal erythema.   Eyes:     General: No scleral icterus.    Extraocular Movements: Extraocular movements intact.     Conjunctiva/sclera: Conjunctivae normal.     Pupils: Pupils are equal, round, and reactive to light.   Neck:     Thyroid : No thyromegaly or thyroid  tenderness.   Cardiovascular:     Rate and Rhythm: Normal rate and regular rhythm.     Pulses: Normal pulses.     Heart sounds: Normal heart sounds.  Pulmonary:     Effort: Pulmonary effort is normal. No tachypnea, bradypnea or respiratory distress.     Breath sounds: Normal breath sounds. No stridor. No wheezing, rhonchi or rales.  Abdominal:     General: Bowel sounds are normal. There is no distension.     Palpations: Abdomen is soft. There is no mass.     Tenderness: There is no abdominal tenderness. There is no guarding.     Hernia: No hernia is present.   Musculoskeletal:     Cervical back: Normal range of motion and neck supple.     Right lower leg: No edema.      Left lower leg: No edema.  Lymphadenopathy:     Cervical: No cervical adenopathy.   Skin:    General: Skin is warm and dry.   Neurological:     Mental Status: He is alert and oriented to person, place, and time. Mental status is at baseline.   Psychiatric:        Mood and Affect: Mood normal.        Behavior: Behavior normal.     Depression Screen    11/08/2023    8:31 AM 02/08/2023    8:57 AM 06/16/2022    4:08 PM 04/25/2022   11:33 AM  PHQ 2/9 Scores  PHQ - 2 Score 4 5 4 2   PHQ- 9 Score 18 18 17 9    Results for orders placed or performed in visit on 11/08/23  Comprehensive metabolic panel with GFR  Result Value Ref Range   Glucose 111 (H) 70 - 99 mg/dL   BUN 11 6 - 24 mg/dL   Creatinine, Ser 8.95 0.76 - 1.27 mg/dL   eGFR 90 >40 fO/fpw/8.26   BUN/Creatinine Ratio 11 9 - 20   Sodium 143 134 - 144 mmol/L   Potassium 5.1 3.5 - 5.2 mmol/L   Chloride 105 96 - 106 mmol/L   CO2 22 20 - 29  mmol/L   Calcium 9.8 8.7 - 10.2 mg/dL   Total Protein 6.9 6.0 - 8.5 g/dL   Albumin  4.4 4.1 - 5.1 g/dL   Globulin, Total 2.5 1.5 - 4.5 g/dL   Bilirubin Total 1.3 (H) 0.0 - 1.2 mg/dL   Alkaline Phosphatase 67 44 - 121 IU/L   AST 18 0 - 40 IU/L   ALT 18 0 - 44 IU/L  Hemoglobin A1c  Result Value Ref Range   Hgb A1c MFr Bld 6.2 (H) 4.8 - 5.6 %   Est. average glucose Bld gHb Est-mCnc 131 mg/dL  Lipid panel  Result Value Ref Range   Cholesterol, Total 182 100 - 199 mg/dL   Triglycerides 889 0 - 149 mg/dL   HDL 40 >60 mg/dL   VLDL Cholesterol Cal 20 5 - 40 mg/dL   LDL Chol Calc (NIH) 877 (H) 0 - 99 mg/dL   Chol/HDL Ratio 4.6 0.0 - 5.0 ratio    Assessment & Plan     Encounter for medical examination to establish care  Irritable bowel syndrome with diarrhea -     Ambulatory referral to Gastroenterology  Recurrent moderate major depressive disorder with anxiety (HCC) -     Nortriptyline  HCl; Take 1 capsule (50 mg total) by mouth 2 (two) times daily.  Dispense: 180 capsule; Refill:  3 -     QUEtiapine  Fumarate; Take 1 tablet (50 mg total) by mouth at bedtime.  Dispense: 90 tablet; Refill: 3  PTSD (post-traumatic stress disorder) -     traZODone  HCl; Take 0.5-1 tablets (25-50 mg total) by mouth at bedtime as needed. for sleep  Dispense: 90 tablet; Refill: 1 -     Nortriptyline  HCl; Take 1 capsule (50 mg total) by mouth 2 (two) times daily.  Dispense: 180 capsule; Refill: 3 -     QUEtiapine  Fumarate; Take 1 tablet (50 mg total) by mouth at bedtime.  Dispense: 90 tablet; Refill: 3  Insomnia due to mental condition -     traZODone  HCl; Take 0.5-1 tablets (25-50 mg total) by mouth at bedtime as needed. for sleep  Dispense: 90 tablet; Refill: 1  Gastroesophageal reflux disease without esophagitis -     Ambulatory referral to Gastroenterology  Prediabetes -     Hemoglobin A1c  Mixed hyperlipidemia -     Comprehensive metabolic panel with GFR -     Lipid panel  Cervical disc herniation Assessment & Plan: Status post fusion in January; no problems at this time.   Elevated bilirubin  Gilbert syndrome     Encounter for medical exam to establish care Physical exam overall unremarkable except as noted above. Routine lab work ordered as noted. Discussed vaccinations including COVID booster and HPV vaccine. Declined COVID booster, considering HPV vaccine series. - Offer HPV vaccine series, starting with the first dose today if he agrees.  Irritable Bowel Syndrome (IBS) with Diarrhea Chronic IBS with diarrhea. Viberzi  sometimes alleviates symptoms but can cause constipation. Uses Zofran  for nausea. Seeking new GI specialist. - Refer to gastroenterology for further management. - Continue Viberzi  as prescribed. - Continue Zofran  as needed for nausea.  Depression, PTSD, Anxiety Chronic depression, PTSD, and anxiety. On nortriptyline  and Seroquel  with variable symptom control. - Continue nortriptyline  and Seroquel . - Assess mental health status and adjust medications  as needed.  Insomnia Well-controlled with trazodone . Refill today.  Prediabetes Prediabetes with lifestyle factors contributing to risk. Physically active but diet leans towards unhealthy choices. - Order blood work to monitor glucose levels. - Encourage  reduction in carbohydrate intake to improve glycemic control.  Hyperlipidemia Long-standing hyperlipidemia with slightly elevated LDL levels. Aware of dietary habits contributing to cholesterol levels. - Order blood work to assess current lipid levels. - Encourage dietary modifications to reduce LDL levels.  History of Deep Vein Thrombosis (DVT) and Pulmonary Embolism (PE) Single episode of DVT and PE with no identifiable cause. Treated with Xarelto  for one year. No current anticoagulation needed.  Elevated bilirubin; Gilbert Syndrome Chronic elevated bilirubin levels consistent with Gilbert syndrome. Asymptomatic since mid-2000s. - Monitor bilirubin levels as part of routine blood work.    Follow-up Routine follow-up care discussed with no immediate need for frequent visits. - Schedule annual follow-up visit unless new issues arise. - Send referral to gastroenterology for further management of IBS.  Return in about 1 year (around 11/07/2024) for CPE.     I discussed the assessment and treatment plan with the patient  The patient was provided an opportunity to ask questions and all were answered. The patient agreed with the plan and demonstrated an understanding of the instructions.   The patient was advised to call back or seek an in-person evaluation if the symptoms worsen or if the condition fails to improve as anticipated.    LAURAINE LOISE BUOY, DO  [facility] [phone] (phone) [phone] (fax)  [facility]

## 2023-11-09 LAB — COMPREHENSIVE METABOLIC PANEL WITH GFR
ALT: 18 IU/L (ref 0–44)
AST: 18 IU/L (ref 0–40)
Albumin: 4.4 g/dL (ref 4.1–5.1)
Alkaline Phosphatase: 67 IU/L (ref 44–121)
BUN/Creatinine Ratio: 11 (ref 9–20)
BUN: 11 mg/dL (ref 6–24)
Bilirubin Total: 1.3 mg/dL — ABNORMAL HIGH (ref 0.0–1.2)
CO2: 22 mmol/L (ref 20–29)
Calcium: 9.8 mg/dL (ref 8.7–10.2)
Chloride: 105 mmol/L (ref 96–106)
Creatinine, Ser: 1.04 mg/dL (ref 0.76–1.27)
Globulin, Total: 2.5 g/dL (ref 1.5–4.5)
Glucose: 111 mg/dL — ABNORMAL HIGH (ref 70–99)
Potassium: 5.1 mmol/L (ref 3.5–5.2)
Sodium: 143 mmol/L (ref 134–144)
Total Protein: 6.9 g/dL (ref 6.0–8.5)
eGFR: 90 mL/min/{1.73_m2} (ref >59)

## 2023-11-09 LAB — LIPID PANEL
Chol/HDL Ratio: 4.6 ratio (ref 0.0–5.0)
Cholesterol, Total: 182 mg/dL (ref 100–199)
HDL: 40 mg/dL (ref >39)
LDL Chol Calc (NIH): 122 mg/dL — ABNORMAL HIGH (ref 0–99)
Triglycerides: 110 mg/dL (ref 0–149)
VLDL Cholesterol Cal: 20 mg/dL (ref 5–40)

## 2023-11-09 LAB — HEMOGLOBIN A1C
Est. average glucose Bld gHb Est-mCnc: 131 mg/dL
Hgb A1c MFr Bld: 6.2 % — ABNORMAL HIGH (ref 4.8–5.6)

## 2023-11-14 NOTE — Progress Notes (Incomplete)
 New patient visit   Patient: Sergio Garcia   DOB: June 09, 1975   47 y.o. Male  MRN: [id number] Visit Date: 11/08/2023  Today's healthcare provider: LAURAINE LOISE BUOY, DO   Chief Complaint  Patient presents with  . Establish Care    Previous Doctor R. Antonette Last colonoscopy 2019-2020 unsure Would like a new GI doctor [facility] across from [place])   Subjective    Sergio Garcia is a 48 y.o. male who presents today as a new patient to establish care.  HPI HPI     Establish Care    Additional comments: Previous Doctor FABIENE Antonette Last colonoscopy 2019-2020 unsure Would like a new GI doctor [facility] across from [facility])      Last edited by Wilfred Hargis RAMAN, CMA on 11/08/2023  8:03 AM.      Sergio Garcia is a 48 year old male with irritable bowel syndrome who presents for a transfer of care and GI consultation.  He is seeking a new gastroenterologist due to his previous doctor ceasing practice. He prefers a GI doctor located in the office across from [facility], where his cousin also receives care.  He has a history of irritable bowel syndrome (IBS) with predominant diarrhea and reflux. He is currently taking Viberzi  once a day, which sometimes alleviates symptoms but can also cause constipation. He has Zofran  available at home for nausea.  He has a past medical history of blood clots in his leg and lungs, which were discovered simultaneously when he experienced breathing difficulties. A CT scan of his chest confirmed the diagnosis. He was treated with Xarelto  for a year and now undergoes annual scans. No specific cause for the clots was identified.  He has high cholesterol and is prediabetic, though he is just below the threshold for diabetes. He walks 10,000 to 11,000 steps a day at work but admits to a diet that is 'kind of more on the unhealthy side.'  He has a long-standing history of elevated bilirubin, first noted in the mid-2000s when he was hospitalized  for severe abdominal pain. 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  He has a history of depression, PTSD, and anxiety, for which he takes nortriptyline  and Seroquel . He also uses trazodone  as needed at bedtime, which helps with sleep. He experiences occasional headaches and panic attacks, which can cause palpitations.  He underwent neck fusion surgery in January. He also had low back surgery in 2009 and gallbladder surgery in 2015.     Past Medical History:  Diagnosis Date  . Anginal pain (HCC) 2024  . Anxiety   . COVID-19    07/04/20  . Deep vein blood clot of left lower extremity (HCC) 2022  . Depression   . Enteritis   . Gastritis   . GERD (gastroesophageal reflux disease)   . Gilbert's syndrome    hyperbilirubinemia  . IBS (irritable bowel syndrome)   . Inguinal hernia   . Insomnia   . Peripheral vascular disease (HCC)   . Pre-diabetes   . Prostatitis 2008  . Pulmonary embolism (HCC) 2022   Past Surgical History:  Procedure Laterality Date  . ANTERIOR CERVICAL DECOMP/DISCECTOMY FUSION N/A 06/19/2023   Procedure: ANTERIOR CERVICAL DECOMPRESSION/DISCECTOMY FUSION 1 LEVEL;  Surgeon: Burnetta Aures, MD;  Location: [facility] OR;  Service: Orthopedics;  Laterality: N/A;  150 min  . BACK SURGERY  2009   2 ruptured  discs  . CHOLECYSTECTOMY  01/31/2014   GB polyp, chronic cholecystitis   . ESOPHAGOGASTRODUODENOSCOPY  09/2004   gastritis acute and duodenitis  . ESOPHAGOGASTRODUODENOSCOPY  01/14/2014   Dr Jinny  . ESOPHAGOGASTRODUODENOSCOPY N/A 09/16/2021   Procedure: ESOPHAGOGASTRODUODENOSCOPY (EGD);  Surgeon: Jinny Carmine, MD;  Location: [facility] ENDOSCOPY;  Service: Endoscopy;  Laterality: N/A;  . EXPLORATORY LAPAROTOMY  05/2013   Dr Wonda  . LASIK Bilateral 2012  . RIGHT/LEFT HEART CATH AND CORONARY ANGIOGRAPHY N/A 10/18/2019   Procedure: RIGHT/LEFT HEART CATH AND CORONARY ANGIOGRAPHY;  Surgeon: Darron Deatrice LABOR, MD;  Location: [facility] INVASIVE CV LAB;  Service: Cardiovascular;  Laterality: N/A;   Family Status  Relation Name Status  . Mother  Alive  . Father  Alive  . Brother  Alive  . MGF  (Not Specified)  . PGF  Alive  . Mat Uncle  (Not Specified)  . Cousin  (Not Specified)  . Other  (Not Specified)  . Neg Hx  (Not Specified)  No partnership data on file   Family History  Problem Relation Age of Onset  . Colon polyps Mother   . Cancer Mother        sinus cavity, on XRT  . Asthma Mother   . Heart disease Father   . Heart attack Father        x 2  . Asthma Brother   . Pancreatic cancer Maternal Grandfather   . Colon cancer Maternal Grandfather   . Heart Problems Paternal Grandfather   . Cancer Maternal Uncle   . Stroke Cousin        age 75/44  . Diabetes Other        strong FH d both sides of family   . Mental illness Neg Hx    Social History   Socioeconomic History  . Marital status: Divorced    Spouse name: Not on file  . Number of children: 2  . Years of education: Not on file  . Highest education level: 12th grade  Occupational History  . Occupation: Sports administrator: [employer]  Tobacco Use  . Smoking status: Never  . Smokeless tobacco: Never  Vaping Use  . Vaping status: Never Used  Substance and Sexual Activity  . Alcohol use: Yes    Comment: occassional social  . Drug use: No  . Sexual activity: Yes  Other Topics Concern  . Not on file  Social History Narrative   Married.   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   Enjoys watching racing, Curator, deer hunting.             Social Drivers of Health   Financial Resource Strain: High Risk (11/07/2023)   Overall Financial Resource Strain (CARDIA)   . Difficulty of Paying Living Expenses: Hard  Food Insecurity: Food Insecurity Present (11/07/2023)   Hunger Vital Sign   . Worried About Programme researcher, broadcasting/film/video in the Last Year: Often true   . Ran Out of  Food in the Last Year: Often true  Transportation Needs: No Transportation Needs (11/07/2023)   PRAPARE - Transportation   . Lack of Transportation (Medical): No   . Lack of Transportation (Non-Medical): No  Physical Activity: Sufficiently Active (11/07/2023)   Exercise Vital Sign   . Days of Exercise per Week: 5 days   . Minutes of Exercise per Session: 150+ min  Stress: Stress Concern Present (11/07/2023)   [employer]  of Occupational Health - Occupational Stress Questionnaire   . Feeling of Stress: Very much  Social Connections: Socially Integrated (11/07/2023)   Social Connection and Isolation Panel   . Frequency of Communication with Friends and Family: More than three times a week   . Frequency of Social Gatherings with Friends and Family: Once a week   . Attends Religious Services: More than 4 times per year   . Active Member of Clubs or Organizations: Yes   . Attends Banker Meetings: More than 4 times per year   . Marital Status: Married   Outpatient Medications Prior to Visit  Medication Sig  . nortriptyline  (PAMELOR ) 50 MG capsule Take 50 mg by mouth 2 (two) times daily.  . ondansetron  (ZOFRAN ) 4 MG tablet Take 1 tablet (4 mg total) by mouth every 8 (eight) hours as needed for nausea or vomiting.  . QUEtiapine  (SEROQUEL ) 50 MG tablet Take 50 mg by mouth at bedtime.  . traZODone  (DESYREL ) 50 MG tablet Take 0.5-1 tablets (25-50 mg total) by mouth at bedtime as needed. for sleep  . VIBERZI  75 MG TABS Take 1 tablet (75 mg total) by mouth 2 (two) times daily. (Patient taking differently: Take 75 mg by mouth 2 (two) times daily as needed (IBS).)   No facility-administered medications prior to visit.   Allergies  Allergen Reactions  . Morphine And Codeine Anaphylaxis  . Testosterone     Dvt/PE  . Morphine Nausea And Vomiting  . Tdap [Tetanus-Diphth-Acell Pertussis] Hives and Rash    Immunization History  Administered Date(s) Administered  . Hepatitis B,  ADULT 09/14/2017, 10/12/2017, 01/04/2018  . Influenza Split 06/20/2011  . Influenza,inj,Quad PF,6+ Mos 02/16/2015, 02/17/2016, 01/24/2018, 01/23/2019, 04/07/2021, 02/11/2022  . Influenza-Unspecified 03/15/2013, 01/24/2018, 04/15/2020  . PFIZER(Purple Top)SARS-COV-2 Vaccination 08/09/2019, 08/30/2019, 04/15/2020  . PPD Test 09/11/2017  . Td 05/08/2003  . Tdap 06/20/2011, 01/04/2018    Health Maintenance  Topic Date Due  . HPV VACCINES (1 - 3-dose SCDM series) Never done  . Colonoscopy  12/24/2022  . COVID-19 Vaccine (4 - 2024-25 season) 01/22/2023  . INFLUENZA VACCINE  12/22/2023  . DTaP/Tdap/Td (4 - Td or Tdap) 01/05/2028  . Hepatitis C Screening  Completed  . HIV Screening  Completed  . Meningococcal B Vaccine  Aged Out    Patient Care Team: Kieu Quiggle, Lauraine SAILOR, DO as PCP - General (Family Medicine) Darron Deatrice LABOR, MD as PCP - Cardiology (Cardiology) Aneita Gwendlyn DASEN, MD (Inactive) (Gastroenterology) Vannie Delon LABOR, MD (Internal Medicine) Dessa Reyes ORN, MD (General Surgery) Antonette Angeline ORN, NP as Nurse Practitioner (Internal Medicine)  Review of Systems  Constitutional:  Negative for appetite change, chills, fatigue and fever.  HENT:  Negative for congestion, ear pain, hearing loss, nosebleeds and trouble swallowing.   Eyes:  Negative for pain and visual disturbance.  Respiratory:  Negative for cough, chest tightness and shortness of breath.   Cardiovascular:  Negative for chest pain, palpitations and leg swelling.  Gastrointestinal:  Positive for diarrhea (baseline - IBS). Negative for abdominal pain, blood in stool, constipation, nausea and vomiting.  Endocrine: Negative for polydipsia, polyphagia and polyuria.  Genitourinary:  Negative for dysuria and flank pain.  Musculoskeletal:  Negative for arthralgias, back pain, joint swelling, myalgias and neck stiffness.  Skin:  Negative for color change, rash and wound.  Neurological:  Negative for dizziness, tremors,  seizures, speech difficulty, weakness, light-headedness and headaches.  Psychiatric/Behavioral:  Negative for behavioral problems, confusion, decreased concentration, dysphoric mood and sleep disturbance. The  patient is not nervous/anxious.   All other systems reviewed and are negative.   {Insert previous labs (optional):23779} {See past labs  Heme  Chem  Endocrine  Serology  Results Review (optional):1}   Objective    BP 122/89 (BP Location: Left Arm, Patient Position: Sitting, Cuff Size: Normal)   Pulse 76   Resp 14   Ht 6' 2 (1.88 m)   Wt 191 lb 3.2 oz (86.7 kg)   SpO2 100%   BMI 24.55 kg/m  {Insert last BP/Wt (optional):23777}{See vitals history (optional):1}   Physical Exam Vitals and nursing note reviewed.  Constitutional:      General: He is awake.     Appearance: Normal appearance.  HENT:     Head: Normocephalic and atraumatic.     Right Ear: Tympanic membrane, ear canal and external ear normal.     Left Ear: Tympanic membrane, ear canal and external ear normal.     Nose: Nose normal.     Mouth/Throat:     Mouth: Mucous membranes are moist.     Pharynx: Oropharynx is clear. No oropharyngeal exudate or posterior oropharyngeal erythema.   Eyes:     General: No scleral icterus.    Extraocular Movements: Extraocular movements intact.     Conjunctiva/sclera: Conjunctivae normal.     Pupils: Pupils are equal, round, and reactive to light.   Neck:     Thyroid : No thyromegaly or thyroid  tenderness.   Cardiovascular:     Rate and Rhythm: Normal rate and regular rhythm.     Pulses: Normal pulses.     Heart sounds: Normal heart sounds.  Pulmonary:     Effort: Pulmonary effort is normal. No tachypnea, bradypnea or respiratory distress.     Breath sounds: Normal breath sounds. No stridor. No wheezing, rhonchi or rales.  Abdominal:     General: Bowel sounds are normal. There is no distension.     Palpations: Abdomen is soft. There is no mass.     Tenderness:  There is no abdominal tenderness. There is no guarding.     Hernia: No hernia is present.   Musculoskeletal:     Cervical back: Normal range of motion and neck supple.     Right lower leg: No edema.     Left lower leg: No edema.  Lymphadenopathy:     Cervical: No cervical adenopathy.   Skin:    General: Skin is warm and dry.   Neurological:     Mental Status: He is alert and oriented to person, place, and time. Mental status is at baseline.   Psychiatric:        Mood and Affect: Mood normal.        Behavior: Behavior normal.     Depression Screen    02/08/2023    8:57 AM 06/16/2022    4:08 PM 04/25/2022   11:33 AM 12/15/2021   10:43 AM  PHQ 2/9 Scores  PHQ - 2 Score 5 4 2 2   PHQ- 9 Score 18 17 9 11    No results found for any visits on 11/08/23.  Assessment & Plan     Encounter for medical examination to establish care  Mixed hyperlipidemia -     Comprehensive metabolic panel with GFR -     Lipid panel  Irritable bowel syndrome with diarrhea -     Ambulatory referral to Gastroenterology  Gastroesophageal reflux disease without esophagitis -     Ambulatory referral to Gastroenterology  Prediabetes -  Hemoglobin A1c  PTSD (post-traumatic stress disorder)  Cervical disc herniation Assessment & Plan: Status post fusion in January; no problems at this time.   Insomnia due to mental condition  Elevated bilirubin     Encounter for medical exam to establish care Physical exam overall unremarkable except as noted above. Routine lab work ordered as noted. Discussed vaccinations including COVID booster and HPV vaccine. Declined COVID booster, considering HPV vaccine series. - Offer HPV vaccine series, starting with the first dose today if he agrees.  Irritable Bowel Syndrome (IBS) with Diarrhea Chronic IBS with diarrhea. Vibrazium sometimes alleviates symptoms but can cause constipation. Uses Zofran  for nausea. Seeking new GI specialist. - Refer to  gastroenterology for further management. - Continue Viberzi  as prescribed. - Continue Zofran  as needed for nausea.  Depression, PTSD, Anxiety Chronic depression, PTSD, and anxiety. On nortriptyline  and Seroquel  with variable symptom control. - Continue nortriptyline  and Seroquel . - Assess mental health status and adjust medications as needed.  Prediabetes Prediabetes with lifestyle factors contributing to risk. Physically active but diet leans towards unhealthy choices. - Order blood work to monitor glucose levels. - Encourage reduction in carbohydrate intake to improve glycemic control.  Hyperlipidemia Long-standing hyperlipidemia with slightly elevated LDL levels. Aware of dietary habits contributing to cholesterol levels. - Order blood work to assess current lipid levels. - Encourage dietary modifications to reduce LDL levels.  Deep Vein Thrombosis (DVT) and Pulmonary Embolism (PE) Single episode of DVT and PE with no identifiable cause. Treated with Xarelto  for one year. No current anticoagulation needed.  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 Asymptomatic since mid-2000s. - Monitor bilirubin levels as part of routine blood work.    Follow-up Routine follow-up care discussed with no immediate need for frequent visits. - Schedule annual follow-up visit unless new issues arise. - Send referral to gastroenterology for further management of IBS. ***  No follow-ups on file.     I discussed the assessment and treatment plan with the patient  The patient was provided an opportunity to ask questions and all were answered. The patient agreed with the plan and demonstrated an understanding of the instructions.   The patient was advised to call back or seek an in-person evaluation if the symptoms worsen or if the condition fails to improve as anticipated.    LAURAINE LOISE BUOY, DO  [facility] [phone]  (phone) [phone] (fax)  [facility]

## 2023-11-17 ENCOUNTER — Ambulatory Visit: Payer: Self-pay | Admitting: Family Medicine

## 2023-11-19 ENCOUNTER — Encounter: Payer: Self-pay | Admitting: Family Medicine

## 2023-12-13 ENCOUNTER — Encounter: Payer: Self-pay | Admitting: Gastroenterology

## 2023-12-18 ENCOUNTER — Encounter: Payer: Self-pay | Admitting: Family Medicine

## 2024-01-03 ENCOUNTER — Encounter: Payer: Self-pay | Admitting: Family Medicine

## 2024-01-03 ENCOUNTER — Ambulatory Visit: Admitting: Family Medicine

## 2024-01-03 VITALS — BP 116/82 | HR 70 | Temp 98.4°F | Ht 74.0 in | Wt 190.8 lb

## 2024-01-03 DIAGNOSIS — T887XXA Unspecified adverse effect of drug or medicament, initial encounter: Secondary | ICD-10-CM

## 2024-01-03 DIAGNOSIS — F4310 Post-traumatic stress disorder, unspecified: Secondary | ICD-10-CM

## 2024-01-03 DIAGNOSIS — R0609 Other forms of dyspnea: Secondary | ICD-10-CM

## 2024-01-03 DIAGNOSIS — K580 Irritable bowel syndrome with diarrhea: Secondary | ICD-10-CM | POA: Diagnosis not present

## 2024-01-03 DIAGNOSIS — F32A Depression, unspecified: Secondary | ICD-10-CM

## 2024-01-03 DIAGNOSIS — D367 Benign neoplasm of other specified sites: Secondary | ICD-10-CM | POA: Diagnosis not present

## 2024-01-03 DIAGNOSIS — F419 Anxiety disorder, unspecified: Secondary | ICD-10-CM

## 2024-01-03 DIAGNOSIS — M25539 Pain in unspecified wrist: Secondary | ICD-10-CM

## 2024-01-03 MED ORDER — NAPROXEN 500 MG PO TABS: 500.0000 mg | ORAL_TABLET | Freq: Two times a day (BID) | ORAL | 0 refills | Status: DC

## 2024-01-03 MED ORDER — QUETIAPINE FUMARATE ER 150 MG PO TB24: 150.0000 mg | ORAL_TABLET | Freq: Every day | ORAL | 1 refills | Status: DC

## 2024-01-03 NOTE — Progress Notes (Addendum)
 Established patient visit   Patient: Sergio Garcia   DOB: February 25, 1976   48 y.o. Male  MRN: [id number] Visit Date: 01/03/2024  Today's healthcare provider: LAURAINE LOISE BUOY, DO   Chief Complaint  Patient presents with   Medication Problem    Patient presents with medication problem with nortriptyline . Reports body shakes, extremely dry mouth and feeling like he was not emptying bladder fully when he urinated. Also reports chest pain and dizziness- has not had this since he stopped taking it. Reports that he stopped taking it and is back to normal now   Cyst    Reports noticing this about 3 weeks ago, thinks it may have been a bug bite originally. States it feels hard and is very sore and sensitive to touch. Not red or itchy    Wrist Pain    Pain in L wrist, reports that this started 1-2 weeks ago. Does not recall an injury, has pain with lifting objects    Subjective    HPI Sergio Garcia is a 48 year old male who presents with side effects from nortriptyline .  He experienced severe side effects from nortriptyline , including significant tremors that impaired his ability to write, dizziness, chest tightness, and difficulty emptying his bladder. Dizziness occurred after being upright for a while, necessitating support to prevent falling. These symptoms resolved after discontinuing nortriptyline .  He is currently taking Seroquel  and reports doing well on it. He also takes trazodone  as needed, usually a whole tablet at bedtime, and Viberzi  for IBS as needed. Viberzi  sometimes causes sharp stomach pains. He has an upcoming appointment with a new GI doctor in [place].  He has a history of blood clots in 05/2020, which he associates with receiving the COVID vaccine. He was hospitalized for a week and treated with Xarelto  for a year. He reports that he volunteers to have a CT scan about once a year to check that everything is still clear.  He experiences shortness of breath during strenuous  activities, which he describes as almost leading to panic attacks. He has not been to the gym since 2019 or 2020, which he attributes his decreased stamina to.  He reports left-sided ulnar wrist pain, which has been present for about two weeks. The pain is described as a strong ache, particularly when gripping objects. He has been using Tylenol  or ibuprofen as needed for relief.  He mentions a cyst that initially appeared as a bug bite before enlarging.  It has since decreased in size, though it remains hard and sore.       Medications: Outpatient Medications Prior to Visit  Medication Sig Note   ondansetron  (ZOFRAN ) 4 MG tablet Take 1 tablet (4 mg total) by mouth every 8 (eight) hours as needed for nausea or vomiting.    traZODone  (DESYREL ) 50 MG tablet Take 0.5-1 tablets (25-50 mg total) by mouth at bedtime as needed. for sleep    VIBERZI  75 MG TABS Take 1 tablet (75 mg total) by mouth 2 (two) times daily. (Patient taking differently: Take 75 mg by mouth 2 (two) times daily as needed (IBS).)    [DISCONTINUED] QUEtiapine  (SEROQUEL ) 50 MG tablet Take 1 tablet (50 mg total) by mouth at bedtime. 01/03/2024: back to original dose   [DISCONTINUED] nortriptyline  (PAMELOR ) 50 MG capsule Take 1 capsule (50 mg total) by mouth 2 (two) times daily. (Patient not taking: Reported on 01/03/2024) 01/03/2024: dizziness, trouble emptying bladder, dizziness, chest pain; all resolved with discontinuation  No facility-administered medications prior to visit.    Review of Systems  Respiratory:  Positive for shortness of breath (Some dyspnea on exertion with strenuous activity). Negative for cough and wheezing.   Cardiovascular:  Negative for chest pain, palpitations and leg swelling.  Gastrointestinal:  Positive for abdominal pain (Intermittent; see HPI) and diarrhea (r/t IBS-D).  Musculoskeletal:  Positive for arthralgias.  Neurological:  Negative for weakness and headaches.        Objective    BP  116/82 (BP Location: Right Arm, Patient Position: Sitting, Cuff Size: Normal) Comment: manual  Pulse 70   Temp 98.4 F (36.9 C) (Oral)   Ht 6' 2 (1.88 m)   Wt 190 lb 12.8 oz (86.5 kg)   SpO2 97%   BMI 24.50 kg/m     Physical Exam Vitals reviewed.  Constitutional:      General: He is not in acute distress.    Appearance: Normal appearance. He is not diaphoretic.  HENT:     Head: Normocephalic and atraumatic.  Eyes:     General: No scleral icterus.    Conjunctiva/sclera: Conjunctivae normal.  Cardiovascular:     Rate and Rhythm: Normal rate and regular rhythm.     Pulses: Normal pulses.     Heart sounds: Normal heart sounds. No murmur heard. Pulmonary:     Effort: Pulmonary effort is normal. No respiratory distress.     Breath sounds: Normal breath sounds. No wheezing or rhonchi.  Musculoskeletal:     Cervical back: Neck supple.     Right lower leg: No edema.     Left lower leg: No edema.  Lymphadenopathy:     Cervical: No cervical adenopathy.  Skin:    General: Skin is warm and dry.     Findings: No rash.      Neurological:     Mental Status: He is alert and oriented to person, place, and time. Mental status is at baseline.  Psychiatric:        Mood and Affect: Mood normal.        Behavior: Behavior normal.      The 10-year ASCVD risk score (Arnett DK, et al., 2019) is: 2.1%   Values used to calculate the score:     Age: 47 years     Clincally relevant sex: Male     Is Non-Hispanic African American: No     Diabetic: No     Tobacco smoker: No     Systolic Blood Pressure: 116 mmHg     Is BP treated: No     HDL Cholesterol: 40 mg/dL     Total Cholesterol: 182 mg/dL   No results found for any visits on 01/03/24.  Assessment & Plan    Anxiety and depression -     Ambulatory referral to Psychiatry -     QUEtiapine  Fumarate ER; Take 1 tablet (150 mg total) by mouth at bedtime.  Dispense: 90 tablet; Refill: 1  PTSD (post-traumatic stress disorder) -      Ambulatory referral to Psychiatry -     QUEtiapine  Fumarate ER; Take 1 tablet (150 mg total) by mouth at bedtime.  Dispense: 90 tablet; Refill: 1  Non-dose-related adverse reaction to medication, initial encounter  Irritable bowel syndrome with diarrhea Assessment & Plan: Currently on Viberzi  but experiences stabbing pains with it.   Cyst, dermoid, arm, left -     Ambulatory referral to General Surgery  Pain of ulnar side of wrist -     Naproxen ;  Take 1 tablet (500 mg total) by mouth 2 (two) times daily with a meal.  Dispense: 14 tablet; Refill: 0  Dyspnea on exertion       Non-dose-related adverse effects of medication (nortriptyline ) Nortriptyline  caused significant side effects including tremor, dizziness, chest pain, and urinary retention. Symptoms resolved after discontinuation. - Document adverse effects of nortriptyline  in medical record.  Depression and anxiety; post-traumatic stress disorder Continues to experience irritability at work and home, possibly related to depression, anxiety, and PTSD. Currently on Seroquel  and trazodone  as needed.  Patient is interested in doing [employer] testing.  Discussed benefits and drawbacks of testing. - Increase Seroquel  dosage and transition to extended release formulation. - Refer to counseling for PTSD, depression and anxiety. - Schedule follow-up in 4-8 weeks to assess response to medication adjustment. - Gave patient contact information for [employer] for him to determine exact cost (co-pay, if applicable) prior to ordering evaluation due to uncertain benefit of testing.  Irritable bowel syndrome with diarrhea and adverse effects of Viberzi  Experiences sharp stabbing pains in the stomach potentially related to Viberzi . IBS with diarrhea predominant symptoms. - Continue Viberzi  as needed. - Follow up with GI specialist in [place].  Defer to specialist management.  Left ulnar wrist pain, likely muscular strain Left wrist pain  likely due to muscular strain, exacerbated by gripping activities. Pain present for about two weeks. - Prescribe high-dose naproxen  for up to one week (minimum 5 days). - Advise against taking ibuprofen, Aleve , aspirin , Goody powders, or BC powders during naproxen  treatment.  Dyspnea on exertion Experiences shortness of breath during strenuous activities, possibly related to decreased stamina since stopping regular exercise. - Encourage slow, steady return to regular exercise to improve stamina.  Pulmonary embolism and deep vein thrombosis (2022) PE and DVT in January 2022, possibly related to COVID-19 vaccination. Completed one year of Xarelto  post-event. No recurrence of clots since then. No confirmed ASCVD; no evidence of atherosclerosis/CAD on multiple CTA chest (incl. 2024); no blockages/CAD on heart catheterization in 2021; will not initiate statin at this time.  Left dermoid cyst Dermoid cyst initially thought to be a bug bite, now improving but still hard and sore. Likely a true cyst that has been walled off by the body. - Referred to general surgery for cyst removal.    Return in about 6 weeks (around 02/14/2024) for Anx/Dep.      I discussed the assessment and treatment plan with the patient  The patient was provided an opportunity to ask questions and all were answered. The patient agreed with the plan and demonstrated an understanding of the instructions.   The patient was advised to call back or seek an in-person evaluation if the symptoms worsen or if the condition fails to improve as anticipated.    LAURAINE LOISE BUOY, DO  [facility] [phone] (phone) [phone] (fax)  [facility]

## 2024-01-03 NOTE — Patient Instructions (Signed)
 [employer]: [email or web address] [phone]

## 2024-01-03 NOTE — Assessment & Plan Note (Signed)
 Currently on Viberzi  but experiences stabbing pains with it.

## 2024-01-17 NOTE — Progress Notes (Deleted)
 Patient ID: Sergio Garcia, male   DOB: 27-Mar-1976, 48 y.o.   MRN: [id number]  Chief Complaint:  ***  History of Present Illness Sergio Garcia is a 48 y.o. male with ***.  Past Medical History Past Medical History:  Diagnosis Date   Allergy    Seasonal allergies   Anginal pain (HCC) 2024   Anxiety    COVID-19    07/04/20   Deep vein blood clot of left lower extremity (HCC) 2022   Depression    Enteritis    Gastritis    GERD (gastroesophageal reflux disease)    Gilbert's syndrome    hyperbilirubinemia   IBS (irritable bowel syndrome)    Inguinal hernia    Insomnia    Peripheral vascular disease (HCC)    Pre-diabetes    Prostatitis 2008   Pulmonary embolism (HCC) 2022      Past Surgical History:  Procedure Laterality Date   ANTERIOR CERVICAL DECOMP/DISCECTOMY FUSION N/A 06/19/2023   Procedure: ANTERIOR CERVICAL DECOMPRESSION/DISCECTOMY FUSION 1 LEVEL;  Surgeon: Burnetta Aures, MD;  Location: [facility] OR;  Service: Orthopedics;  Laterality: N/A;  150 min   BACK SURGERY  2009   2 ruptured discs   CHOLECYSTECTOMY  01/31/2014   GB polyp, chronic cholecystitis    ESOPHAGOGASTRODUODENOSCOPY  09/2004   gastritis acute and duodenitis   ESOPHAGOGASTRODUODENOSCOPY  01/14/2014   Dr Jinny   ESOPHAGOGASTRODUODENOSCOPY N/A 09/16/2021   Procedure: ESOPHAGOGASTRODUODENOSCOPY (EGD);  Surgeon: Jinny Carmine, MD;  Location: [facility] ENDOSCOPY;  Service: Endoscopy;  Laterality: N/A;   EXPLORATORY LAPAROTOMY  05/2013   Dr Wonda   EYE SURGERY     Lasik surgery 2012   LASIK Bilateral 2012   RIGHT/LEFT HEART CATH AND CORONARY ANGIOGRAPHY N/A 10/18/2019   Procedure: RIGHT/LEFT HEART CATH AND CORONARY ANGIOGRAPHY;  Surgeon: Darron Deatrice LABOR, MD;  Location: [facility] INVASIVE CV LAB;  Service: Cardiovascular;  Laterality: N/A;   SPINE SURGERY  2009, 2025   L4 / L5 in 2009, Neck fusion in 05/2023    Allergies  Allergen Reactions   Morphine And Codeine Anaphylaxis   Testosterone     Dvt/PE   Morphine  Nausea And Vomiting   Tdap [Tetanus-Diphth-Acell Pertussis] Hives and Rash    Current Outpatient Medications  Medication Sig Dispense Refill   naproxen  (NAPROSYN ) 500 MG tablet Take 1 tablet (500 mg total) by mouth 2 (two) times daily with a meal. 14 tablet 0   ondansetron  (ZOFRAN ) 4 MG tablet Take 1 tablet (4 mg total) by mouth every 8 (eight) hours as needed for nausea or vomiting. 20 tablet 0   QUEtiapine  Fumarate (SEROQUEL  XR) 150 MG 24 hr tablet Take 1 tablet (150 mg total) by mouth at bedtime. 90 tablet 1   traZODone  (DESYREL ) 50 MG tablet Take 0.5-1 tablets (25-50 mg total) by mouth at bedtime as needed. for sleep 90 tablet 1   VIBERZI  75 MG TABS Take 1 tablet (75 mg total) by mouth 2 (two) times daily. (Patient taking differently: Take 75 mg by mouth 2 (two) times daily as needed (IBS).) 180 tablet 0   No current facility-administered medications for this visit.    Family History Family History  Problem Relation Age of Onset   Colon polyps Mother    Cancer Mother        sinus cavity, on XRT   Asthma Mother    Heart disease Father    Heart attack Father        x 2   Asthma Brother  Pancreatic cancer Maternal Grandfather    Colon cancer Maternal Grandfather    Heart Problems Paternal Grandfather    Cancer Maternal Uncle    Stroke Cousin        age 27/44   Diabetes Other        strong FH d both sides of family    Mental illness Neg Hx       Social History Social History   Tobacco Use   Smoking status: Never   Smokeless tobacco: Never  Vaping Use   Vaping status: Never Used  Substance Use Topics   Alcohol use: Yes    Comment: occassional social   Drug use: No        ROS   Physical Exam There were no vitals taken for this visit.   CONSTITUTIONAL: Well developed, and nourished, appropriately responsive and aware without distress. ***  EYES: Sclera non-icteric.   EARS, NOSE, MOUTH AND THROAT: Mask worn.  *** The oropharynx is clear. Oral mucosa is  pink and moist.  Dentition: ***   Hearing is intact to voice.  NECK: Trachea is midline, and there is no jugular venous distension.  LYMPH NODES:  Lymph nodes in the neck are not appreciated. RESPIRATORY:  Lungs are clear, and breath sounds are equal bilaterally. *** Normal respiratory effort without pathologic use of accessory muscles. CARDIOVASCULAR: Heart is regular in rate and rhythm.  *** Well perfused.  GI: The abdomen is *** soft, nontender, and nondistended. There were no palpable masses. *** I did not appreciate hepatosplenomegaly. There were normal bowel sounds.  *** GU: *** MUSCULOSKELETAL:  Symmetrical muscle tone appreciated in all four extremities.    SKIN: Skin turgor is normal. No pathologic skin lesions appreciated.  NEUROLOGIC:  Motor and sensation appear grossly normal.  Cranial nerves are grossly without defect. PSYCH:  Alert and oriented to person, place and time. Affect is appropriate for situation.  Data Reviewed I have personally reviewed what is currently available of the patient's imaging, recent labs and medical records.   Labs:     Latest Ref Rng & Units 06/13/2023    2:56 PM 05/10/2023   10:21 AM 02/11/2023   11:07 AM  CBC  WBC 4.0 - 10.5 K/uL 7.8  7.9  6.2   Hemoglobin 13.0 - 17.0 g/dL 84.1  83.8  83.3   Hematocrit 39.0 - 52.0 % 46.9  49.0  49.9   Platelets 150 - 400 K/uL 296  268  298       Latest Ref Rng & Units 11/08/2023    8:39 AM 06/13/2023    2:56 PM 05/10/2023   10:21 AM  CMP  Glucose 70 - 99 mg/dL 888  82  899   BUN 6 - 24 mg/dL 11  14  13    Creatinine 0.76 - 1.27 mg/dL 8.95  8.82  9.04   Sodium 134 - 144 mmol/L 143  138  140   Potassium 3.5 - 5.2 mmol/L 5.1  4.2  4.5   Chloride 96 - 106 mmol/L 105  101  103   CO2 20 - 29 mmol/L 22  27  29    Calcium 8.7 - 10.2 mg/dL 9.8  9.2  9.4   Total Protein 6.0 - 8.5 g/dL 6.9   7.1   Total Bilirubin 0.0 - 1.2 mg/dL 1.3   1.6   Alkaline Phos 44 - 121 IU/L 67     AST 0 - 40 IU/L 18   17   ALT 0 -  44  IU/L 18   22    *** {Labs :18171}  Imaging: Radiological images personally reviewed:  *** Within last 24 hrs: No results found.  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   These notes generated with voice recognition software. I apologize for typographical errors.  Honor Leghorn M.D., FACS 01/17/2024, 5:04 PM

## 2024-01-18 ENCOUNTER — Ambulatory Visit: Payer: Self-pay | Admitting: Surgery

## 2024-01-25 ENCOUNTER — Encounter: Payer: Self-pay | Admitting: Family Medicine

## 2024-01-25 ENCOUNTER — Ambulatory Visit (INDEPENDENT_AMBULATORY_CARE_PROVIDER_SITE_OTHER): Admitting: Family Medicine

## 2024-01-25 VITALS — BP 121/83 | HR 92 | Resp 16 | Ht 74.0 in | Wt 195.0 lb

## 2024-01-25 DIAGNOSIS — F332 Major depressive disorder, recurrent severe without psychotic features: Secondary | ICD-10-CM | POA: Diagnosis not present

## 2024-01-25 DIAGNOSIS — F4310 Post-traumatic stress disorder, unspecified: Secondary | ICD-10-CM | POA: Diagnosis not present

## 2024-01-25 DIAGNOSIS — F419 Anxiety disorder, unspecified: Secondary | ICD-10-CM

## 2024-01-25 DIAGNOSIS — F410 Panic disorder [episodic paroxysmal anxiety] without agoraphobia: Secondary | ICD-10-CM

## 2024-01-25 DIAGNOSIS — Z635 Disruption of family by separation and divorce: Secondary | ICD-10-CM

## 2024-01-25 DIAGNOSIS — F32A Depression, unspecified: Secondary | ICD-10-CM

## 2024-01-25 MED ORDER — BUSPIRONE HCL 5 MG PO TABS: 5.0000 mg | ORAL_TABLET | Freq: Two times a day (BID) | ORAL | 0 refills | Status: DC | PRN

## 2024-01-25 MED ORDER — DULOXETINE HCL 20 MG PO CPEP: 20.0000 mg | ORAL_CAPSULE | Freq: Every day | ORAL | 0 refills | Status: DC
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
## 2024-02-01 ENCOUNTER — Other Ambulatory Visit: Payer: Self-pay | Admitting: Family Medicine

## 2024-02-01 DIAGNOSIS — F419 Anxiety disorder, unspecified: Secondary | ICD-10-CM

## 2024-02-01 DIAGNOSIS — F332 Major depressive disorder, recurrent severe without psychotic features: Secondary | ICD-10-CM

## 2024-02-01 DIAGNOSIS — F410 Panic disorder [episodic paroxysmal anxiety] without agoraphobia: Secondary | ICD-10-CM

## 2024-02-07 ENCOUNTER — Ambulatory Visit: Admitting: Gastroenterology

## 2024-02-07 NOTE — Progress Notes (Deleted)
 Sergio Garcia [id number] 1975/09/10   Chief Complaint:  Referring Provider: Donzella Lauraine SAILOR, DO Primary GI MD: Sergio Garcia (previous Dr. Jinny at [facility])  HPI: Sergio Garcia is a 48 y.o. male with past medical history of anxiety/depression, DVT/PE 2022, gastritis, enteritis, GERD, Gill Bears syndrome, IBS, PVD, prediabetes, cholecystectomy who presents today for a complaint of *** .     Most recently seen by Dr. Jinny at [facility] but has previously been seen here by Dr. Aneita, most recently in 2015.  Last visit with Dr. Jinny 07/29/2021.  Noted to have been seen by Dr.[name] [facility]) in 2006/2007, then again in 2020 with upper endoscopy and colonoscopy done at that time.  Reports not available.  Patient noted to have GERD not improved on PPI, as well as IBS.  Was started on dicyclomine  20 mg 3 times daily and Nexium  for acid reflux.  Was scheduled for upper endoscopy due to dysphagia with findings as noted below.    Due for colonoscopy?  Previous GI Procedures/Imaging   EGD 09/16/2021 - Small hiatal hernia.  - Normal stomach.  - Normal examined duodenum.  - Biopsy performed in the middle third of the esophagus.  - Dilation performed in the entire esophagus. Path: A. ESOPHAGUS, MID; COLD BIOPSY:  - BENIGN SQUAMOUS MUCOSA WITH NO SIGNIFICANT HISTOPATHOLOGIC CHANGE.  - NO INCREASE IN INTRAEPITHELIAL EOSINOPHILS (LESS THAN 2 PER HPF).  - NEGATIVE FOR DYSPLASIA AND MALIGNANCY.   EGD 01/14/2014 Normal esophagus Gastritis.  Biopsied. Normal examined duodenum  EGD 02/07/2013 Normal  Past Medical History:  Diagnosis Date   Allergy    Seasonal allergies   Anginal pain (HCC) 2024   Anxiety    COVID-19    07/04/20   Deep vein blood clot of left lower extremity (HCC) 2022   Depression    Enteritis    Gastritis    GERD (gastroesophageal reflux disease)    Gilbert's syndrome    hyperbilirubinemia   IBS (irritable bowel syndrome)    Inguinal hernia    Insomnia     Peripheral vascular disease (HCC)    Pre-diabetes    Prostatitis 2008   Pulmonary embolism (HCC) 2022    Past Surgical History:  Procedure Laterality Date   ANTERIOR CERVICAL DECOMP/DISCECTOMY FUSION N/A 06/19/2023   Procedure: ANTERIOR CERVICAL DECOMPRESSION/DISCECTOMY FUSION 1 LEVEL;  Surgeon: Sergio Aures, MD;  Location: [facility] OR;  Service: Orthopedics;  Laterality: N/A;  150 min   BACK SURGERY  2009   2 ruptured discs   CHOLECYSTECTOMY  01/31/2014   GB polyp, chronic cholecystitis    ESOPHAGOGASTRODUODENOSCOPY  09/2004   gastritis acute and duodenitis   ESOPHAGOGASTRODUODENOSCOPY  01/14/2014   Dr Sergio Garcia   ESOPHAGOGASTRODUODENOSCOPY N/A 09/16/2021   Procedure: ESOPHAGOGASTRODUODENOSCOPY (EGD);  Surgeon: Sergio Garcia Carmine, MD;  Location: [facility] ENDOSCOPY;  Service: Endoscopy;  Laterality: N/A;   EXPLORATORY LAPAROTOMY  05/2013   Dr Sergio Garcia   EYE SURGERY     Lasik surgery 2012   LASIK Bilateral 2012   RIGHT/LEFT HEART CATH AND CORONARY ANGIOGRAPHY N/A 10/18/2019   Procedure: RIGHT/LEFT HEART CATH AND CORONARY ANGIOGRAPHY;  Surgeon: Darron Sergio LABOR, MD;  Location: [facility] INVASIVE CV LAB;  Service: Cardiovascular;  Laterality: N/A;   SPINE SURGERY  2009, 2025   L4 / L5 in 2009, Neck fusion in 05/2023    Current Outpatient Medications  Medication Sig Dispense Refill   busPIRone  (BUSPAR ) 5 MG tablet TAKE 1 TABLET BY MOUTH 2 TIMES DAILY AS NEEDED. 180 tablet 1  DULoxetine  (CYMBALTA ) 20 MG capsule Take 1 capsule (20 mg total) by mouth daily. 90 capsule 0   naproxen  (NAPROSYN ) 500 MG tablet Take 1 tablet (500 mg total) by mouth 2 (two) times daily with a meal. 14 tablet 0   ondansetron  (ZOFRAN ) 4 MG tablet Take 1 tablet (4 mg total) by mouth every 8 (eight) hours as needed for nausea or vomiting. 20 tablet 0   QUEtiapine  Fumarate (SEROQUEL  XR) 150 MG 24 hr tablet Take 1 tablet (150 mg total) by mouth at bedtime. 90 tablet 1   traZODone  (DESYREL ) 50 MG tablet Take 0.5-1 tablets (25-50 mg  total) by mouth at bedtime as needed. for sleep 90 tablet 1   VIBERZI  75 MG TABS Take 1 tablet (75 mg total) by mouth 2 (two) times daily. (Patient taking differently: Take 75 mg by mouth 2 (two) times daily as needed (IBS).) 180 tablet 0   No current facility-administered medications for this visit.    Allergies as of 02/07/2024 - Review Complete 01/25/2024  Allergen Reaction Noted   Morphine and codeine Anaphylaxis 06/27/2013   Testosterone  06/26/2020   Morphine Nausea And Vomiting 07/15/2013   Tdap [tetanus-diphth-acell pertussis] Hives and Rash 06/21/2011    Family History  Problem Relation Age of Onset   Colon polyps Mother    Cancer Mother        sinus cavity, on XRT   Asthma Mother    Heart disease Father    Heart attack Father        x 2   Asthma Brother    Pancreatic cancer Maternal Grandfather    Colon cancer Maternal Grandfather    Heart Problems Paternal Grandfather    Cancer Maternal Uncle    Stroke Cousin        age 49/44   Diabetes Other        strong FH d both sides of family    Mental illness Neg Hx     Social History   Tobacco Use   Smoking status: Never   Smokeless tobacco: Never  Vaping Use   Vaping status: Never Used  Substance Use Topics   Alcohol use: Yes    Comment: occassional social   Drug use: No     Review of Systems:    Constitutional: No weight loss, fever, chills, weakness or fatigue Eyes: No change in vision Ears, Nose, Throat:  No change in hearing or congestion Skin: No rash or itching Cardiovascular: No chest pain, chest pressure or palpitations   Respiratory: No SOB or cough Gastrointestinal: See HPI and otherwise negative Genitourinary: No dysuria or change in urinary frequency Neurological: No headache, dizziness or syncope Musculoskeletal: No new muscle or joint pain Hematologic: No bleeding or bruising    Physical Exam:  Vital signs: There were no vitals taken for this visit.  Constitutional: NAD, Well  developed, Well nourished, alert and cooperative Head:  Normocephalic and atraumatic.  Eyes: No scleral icterus. Conjunctiva pink. Mouth: No oral lesions. Respiratory: Respirations even and unlabored. Lungs clear to auscultation bilaterally.  No wheezes, crackles, or rhonchi.  Cardiovascular:  Regular rate and rhythm. No murmurs. No peripheral edema. Gastrointestinal:  Soft, nondistended, nontender. No rebound or guarding. Normal bowel sounds. No appreciable masses or hepatomegaly. Rectal:  Not performed.  Neurologic:  Alert and oriented x4;  grossly normal neurologically.  Skin:   Dry and intact without significant lesions or rashes. Psychiatric: Oriented to person, place and time. Demonstrates good judgement and reason without abnormal affect or  behaviors.   RELEVANT LABS AND IMAGING: CBC    Component Value Date/Time   WBC 7.8 06/13/2023 1456   RBC 5.16 06/13/2023 1456   HGB 15.8 06/13/2023 1456   HGB 16.2 10/16/2019 1105   HCT 46.9 06/13/2023 1456   HCT 47.0 10/16/2019 1105   PLT 296 06/13/2023 1456   PLT 240 10/16/2019 1105   MCV 90.9 06/13/2023 1456   MCV 90 10/16/2019 1105   MCV 92 01/13/2014 0433   MCH 30.6 06/13/2023 1456   MCHC 33.7 06/13/2023 1456   RDW 12.3 06/13/2023 1456   RDW 12.3 10/16/2019 1105   RDW 13.4 01/13/2014 0433   LYMPHSABS 2.3 12/15/2021 1134   LYMPHSABS 1.6 01/24/2019 0817   LYMPHSABS 1.0 01/13/2014 0433   MONOABS 0.6 12/15/2021 1134   MONOABS 0.4 01/13/2014 0433   EOSABS 0.1 12/15/2021 1134   EOSABS 0.1 01/24/2019 0817   EOSABS 0.0 01/13/2014 0433   BASOSABS 0.0 12/15/2021 1134   BASOSABS 0.0 01/24/2019 0817   BASOSABS 0.0 01/13/2014 0433    CMP     Component Value Date/Time   NA 143 11/08/2023 0839   NA 140 01/13/2014 0433   K 5.1 11/08/2023 0839   K 4.4 01/13/2014 0433   CL 105 11/08/2023 0839   CL 107 01/13/2014 0433   CO2 22 11/08/2023 0839   CO2 29 01/13/2014 0433   GLUCOSE 111 (H) 11/08/2023 0839   GLUCOSE 82 06/13/2023 1456    GLUCOSE 134 (H) 01/13/2014 0433   BUN 11 11/08/2023 0839   BUN 10 01/13/2014 0433   CREATININE 1.04 11/08/2023 0839   CREATININE 0.95 05/10/2023 1021   CALCIUM 9.8 11/08/2023 0839   CALCIUM 8.3 (L) 01/13/2014 0433   PROT 6.9 11/08/2023 0839   PROT 6.1 (L) 01/13/2014 0433   ALBUMIN  4.4 11/08/2023 0839   ALBUMIN  3.2 (L) 01/13/2014 0433   AST 18 11/08/2023 0839   AST 17 01/13/2014 0433   ALT 18 11/08/2023 0839   ALT 12 (L) 01/13/2014 0433   ALKPHOS 67 11/08/2023 0839   ALKPHOS 44 (L) 01/13/2014 0433   BILITOT 1.3 (H) 11/08/2023 0839   BILITOT 2.2 (H) 01/13/2014 0433   GFRNONAA >60 06/13/2023 1456   GFRNONAA >60 01/13/2014 0433   GFRAA 105 10/16/2019 1105   GFRAA >60 01/13/2014 0433   Echocardiogram 05/25/2023 1. Left ventricular ejection fraction, by estimation, is 55% - 60% . The left ventricle has normal function. The left ventricle has no regional wall motion abnormalities. Left ventricular diastolic parameters were normal. GLS - 21. 8% .  2. Right ventricular systolic function is low normal. The right ventricular size is normal. The RV apex appears mildly hypokinetic.  3. The mitral valve is normal in structure. No evidence of mitral valve regurgitation. No evidence of mitral stenosis.  4. The aortic valve is normal in structure. Aortic valve regurgitation is trivial. No aortic stenosis is present.  5. Aortic dilatation noted. There is borderline dilatation of the ascending aorta, measuring 38 mm. There is borderline dilatation of the aortic root, measuring 40 mm.  6. The inferior vena cava is normal in size with greater than 50% respiratory variability, suggesting right atrial pressure of 3 mmHg.  Assessment/Plan:       Camie Furbish, PA-C [facility] 02/07/2024, 12:36 PM  Patient Care Team: Sergio Lauraine SAILOR, DO as PCP - General (Family Medicine) Darron Sergio LABOR, MD as PCP - Cardiology (Cardiology) Sergio Garcia Gwendlyn DASEN, MD (Inactive) (Gastroenterology) Vannie Delon LABOR, MD (Internal Medicine) [place],  Reyes ORN, MD (General Surgery) Antonette Angeline ORN, NP as Nurse Practitioner (Internal Medicine)

## 2024-02-16 ENCOUNTER — Ambulatory Visit (INDEPENDENT_AMBULATORY_CARE_PROVIDER_SITE_OTHER): Admitting: Family Medicine

## 2024-02-16 ENCOUNTER — Encounter: Payer: Self-pay | Admitting: Family Medicine

## 2024-02-16 VITALS — BP 120/92 | HR 75 | Ht 74.0 in | Wt 194.8 lb

## 2024-02-16 DIAGNOSIS — R0789 Other chest pain: Secondary | ICD-10-CM | POA: Diagnosis not present

## 2024-02-16 DIAGNOSIS — Z1211 Encounter for screening for malignant neoplasm of colon: Secondary | ICD-10-CM

## 2024-02-16 DIAGNOSIS — F419 Anxiety disorder, unspecified: Secondary | ICD-10-CM

## 2024-02-16 DIAGNOSIS — F331 Major depressive disorder, recurrent, moderate: Secondary | ICD-10-CM | POA: Diagnosis not present

## 2024-02-16 DIAGNOSIS — Z1212 Encounter for screening for malignant neoplasm of rectum: Secondary | ICD-10-CM

## 2024-02-16 MED ORDER — DULOXETINE HCL 20 MG PO CPEP: 40.0000 mg | ORAL_CAPSULE | Freq: Every day | ORAL | 0 refills | Status: DC

## 2024-02-16 NOTE — Progress Notes (Signed)
 Established patient visit   Patient: Sergio Garcia   DOB: August 07, 1975   48 y.o. Male  MRN: [id number] Visit Date: 02/16/2024  Today's healthcare provider: LAURAINE LOISE BUOY, DO   Chief Complaint  Patient presents with   Medical Management of Chronic Issues    Anxiety and Depression   Subjective    HPI Sergio Garcia is a 48 year old male who presents with stress-related symptoms and medication management.  He experiences a persistent tight and uncomfortable feeling in his chest and reports increased stress levels. He has a family history of heart problems on his father's side and is concerned about potential implications. No shortness of breath, but he experiences dizziness and lightheadedness with exertion, and a single instance of leg swelling after wearing socks, which has not recurred.  He is currently taking buspirone  and duloxetine  at night to manage his symptoms, which have helped him feel better, though he still sees room for improvement. He stopped taking trazodone  due to severe panic attacks, which resolved after discontinuation.  He mentions a previous echocardiogram conducted in January for preoperative clearance before a neck fusion surgery. He does not have a current cardiology appointment scheduled.  He has started seeing a counselor weekly and was referred to a psychiatrist, although the location is inconvenient.      Medications: Outpatient Medications Prior to Visit  Medication Sig   busPIRone  (BUSPAR ) 5 MG tablet TAKE 1 TABLET BY MOUTH 2 TIMES DAILY AS NEEDED.   naproxen  (NAPROSYN ) 500 MG tablet Take 1 tablet (500 mg total) by mouth 2 (two) times daily with a meal.   ondansetron  (ZOFRAN ) 4 MG tablet Take 1 tablet (4 mg total) by mouth every 8 (eight) hours as needed for nausea or vomiting.   QUEtiapine  Fumarate (SEROQUEL  XR) 150 MG 24 hr tablet Take 1 tablet (150 mg total) by mouth at bedtime.   VIBERZI  75 MG TABS Take 1 tablet (75 mg total) by mouth 2 (two)  times daily. (Patient taking differently: Take 75 mg by mouth 2 (two) times daily as needed (IBS).)   [DISCONTINUED] DULoxetine  (CYMBALTA ) 20 MG capsule Take 1 capsule (20 mg total) by mouth daily.   [DISCONTINUED] traZODone  (DESYREL ) 50 MG tablet Take 0.5-1 tablets (25-50 mg total) by mouth at bedtime as needed. for sleep   No facility-administered medications prior to visit.   Review of Systems  Respiratory:  Positive for chest tightness. Negative for cough, shortness of breath and wheezing.   Cardiovascular:  Negative for chest pain, palpitations and leg swelling.        Objective    BP (!) 120/92 (BP Location: Right Arm, Patient Position: Sitting, Cuff Size: Normal)   Pulse 75   Ht 6' 2 (1.88 m)   Wt 194 lb 12.8 oz (88.4 kg)   SpO2 99%   BMI 25.01 kg/m     Physical Exam Vitals reviewed.  Constitutional:      General: He is not in acute distress.    Appearance: Normal appearance. He is not diaphoretic.  HENT:     Head: Normocephalic and atraumatic.  Eyes:     General: No scleral icterus.    Conjunctiva/sclera: Conjunctivae normal.  Cardiovascular:     Rate and Rhythm: Normal rate and regular rhythm.     Pulses: Normal pulses.     Heart sounds: Normal heart sounds. No murmur heard. Pulmonary:     Effort: Pulmonary effort is normal. No respiratory distress.  Breath sounds: Normal breath sounds. No wheezing or rhonchi.  Musculoskeletal:     Cervical back: Neck supple.     Right lower leg: No edema.     Left lower leg: No edema.  Lymphadenopathy:     Cervical: No cervical adenopathy.  Skin:    General: Skin is warm and dry.     Findings: No rash.  Neurological:     Mental Status: He is alert and oriented to person, place, and time. Mental status is at baseline.  Psychiatric:        Mood and Affect: Mood normal.        Behavior: Behavior normal.      No results found for any visits on 02/16/24.  Assessment & Plan    Recurrent moderate major depressive  disorder with anxiety (HCC) -     DULoxetine  HCl; Take 2 capsules (40 mg total) by mouth daily.  Dispense: 180 capsule; Refill: 0  Chest tightness -     EKG 12-Lead  Encounter for colorectal cancer screening -     Ambulatory referral to Gastroenterology     Recurrent moderate major depressive disorder with anxiety Improvement with buspirone  and duloxetine ; considering duloxetine  dose increase for further symptom control. Panic attacks resolved post-trazodone  discontinuation. - Increase duloxetine  to 40 mg daily by taking two 20 mg capsules at night. - Continue buspirone  as prescribed. - Ensure duloxetine  prescription refill. - Start increased duloxetine  dose tonight. - Continue weekly counseling sessions. - Consider psychiatrist referral if needed.  Chest tightness Experiences chest tightness, ongoing since previous visit. Family history of heart issues and stress may contribute. Echocardiogram done in January 2025, overall unremarkable. EKG planned to rule out acute cardiac issues. - Perform EKG. - Encourage cardiology appointment scheduling. - Monitor symptoms post-duloxetine  increase; may defer cardiology appointment if symptoms improve.    Return in about 4 weeks (around 03/15/2024) for Anx/Dep.      I discussed the assessment and treatment plan with the patient  The patient was provided an opportunity to ask questions and all were answered. The patient agreed with the plan and demonstrated an understanding of the instructions.   The patient was advised to call back or seek an in-person evaluation if the symptoms worsen or if the condition fails to improve as anticipated.    LAURAINE LOISE BUOY, DO  [facility] [address] [phone] (phone) [phone] (fax)  [facility]

## 2024-02-16 NOTE — Patient Instructions (Addendum)
 Call and schedule with cardiology (Dr. Darron).

## 2024-03-22 ENCOUNTER — Ambulatory Visit (INDEPENDENT_AMBULATORY_CARE_PROVIDER_SITE_OTHER): Admitting: Family Medicine

## 2024-03-22 ENCOUNTER — Encounter: Payer: Self-pay | Admitting: Family Medicine

## 2024-03-22 VITALS — BP 114/81 | HR 116 | Temp 98.8°F | Ht 74.0 in | Wt 197.7 lb

## 2024-03-22 DIAGNOSIS — F331 Major depressive disorder, recurrent, moderate: Secondary | ICD-10-CM | POA: Diagnosis not present

## 2024-03-22 DIAGNOSIS — F419 Anxiety disorder, unspecified: Secondary | ICD-10-CM | POA: Diagnosis not present

## 2024-03-22 DIAGNOSIS — K580 Irritable bowel syndrome with diarrhea: Secondary | ICD-10-CM

## 2024-03-22 DIAGNOSIS — K529 Noninfective gastroenteritis and colitis, unspecified: Secondary | ICD-10-CM

## 2024-03-22 DIAGNOSIS — R6883 Chills (without fever): Secondary | ICD-10-CM | POA: Diagnosis not present

## 2024-03-22 DIAGNOSIS — R52 Pain, unspecified: Secondary | ICD-10-CM

## 2024-03-22 MED ORDER — DULOXETINE HCL 40 MG PO CPEP: 1.0000 | ORAL_CAPSULE | Freq: Every day | ORAL | 1 refills | Status: DC

## 2024-03-22 NOTE — Progress Notes (Unsigned)
 Established patient visit   Patient: Sergio Garcia   DOB: 06-08-1975   48 y.o. Male  MRN: [id number] Visit Date: 03/22/2024  Today's healthcare provider: LAURAINE LOISE BUOY, DO   Chief Complaint  Patient presents with   Medical Management of Chronic Issues    Patient is here today for anxiety and depression. States that he is not feeling well today, started this morning chills and body aches.  Feels very nauseous, has diarrhea watery-like and feeling some lightheadedness that comes and goes.   Subjective    HPI Sergio Garcia is a 47 year old male who presents with chills, body aches, and diarrhea.  He began experiencing chills, body aches, and diarrhea this morning, approximately two hours after arriving at work. He informed his boss and went home due to feeling unwell.  He has taken Viberzi , his IBS medication, which has slowed down the diarrhea but not completely resolved it. He has had five to six episodes of diarrhea today, described as watery and yellowish, with no blood present. He has not eaten much today, only half a peanut butter and jelly sandwich, and has been drinking fluids like Gatorade to stay hydrated.  He denies any recent antibiotic use and reports a poor appetite since last night. He also experienced a headache that started on both sides of his head and has since moved to the back of his head and neck, but he has no difficulty turning his head.  He mentions feeling dizzy and lightheaded off and on, in particular when he was getting up to get ready to come to the appointment, but these symptoms have not persisted.  He is currently taking Viberzi  for IBS and duloxetine  for depression/anxiety, and he sees a therapist weekly for mood management.  He reports he is doing well on this regimen.  No chest pain or shortness of breath. Dizziness and lightheadedness have come and gone.     {History (Optional):23778}  Medications: Outpatient Medications Prior to Visit   Medication Sig   busPIRone  (BUSPAR ) 5 MG tablet TAKE 1 TABLET BY MOUTH 2 TIMES DAILY AS NEEDED.   naproxen  (NAPROSYN ) 500 MG tablet Take 1 tablet (500 mg total) by mouth 2 (two) times daily with a meal.   ondansetron  (ZOFRAN ) 4 MG tablet Take 1 tablet (4 mg total) by mouth every 8 (eight) hours as needed for nausea or vomiting.   QUEtiapine  Fumarate (SEROQUEL  XR) 150 MG 24 hr tablet Take 1 tablet (150 mg total) by mouth at bedtime.   VIBERZI  75 MG TABS Take 1 tablet (75 mg total) by mouth 2 (two) times daily. (Patient taking differently: Take 75 mg by mouth 2 (two) times daily as needed (IBS).)   [DISCONTINUED] DULoxetine  (CYMBALTA ) 20 MG capsule Take 2 capsules (40 mg total) by mouth daily.   No facility-administered medications prior to visit.    Review of Systems  Constitutional:  Positive for appetite change, chills and fatigue. Negative for fever.  Respiratory: Negative.  Negative for cough, shortness of breath and wheezing.   Cardiovascular:  Negative for chest pain, palpitations and leg swelling.  Gastrointestinal:  Positive for diarrhea and nausea. Negative for constipation and vomiting.  Neurological:  Positive for dizziness and light-headedness. Negative for weakness and headaches.    {Insert previous labs (optional):23779} {See past labs  Heme  Chem  Endocrine  Serology  Results Review (optional):1}   Objective    BP 114/81 (BP Location: Right Arm, Patient Position: Sitting, Cuff Size:  Normal)   Pulse (!) 116   Temp 98.8 F (37.1 C) (Oral)   Ht 6' 2 (1.88 m)   Wt 197 lb 11.2 oz (89.7 kg)   SpO2 99%   BMI 25.38 kg/m  {Insert last BP/Wt (optional):23777}{See vitals history (optional):1}   Physical Exam Vitals and nursing note reviewed.  Constitutional:      General: He is not in acute distress.    Appearance: Normal appearance.  HENT:     Head: Normocephalic and atraumatic.  Eyes:     General: No scleral icterus.    Conjunctiva/sclera: Conjunctivae normal.   Cardiovascular:     Rate and Rhythm: Normal rate.  Pulmonary:     Effort: Pulmonary effort is normal.  Neurological:     Mental Status: He is alert and oriented to person, place, and time. Mental status is at baseline.  Psychiatric:        Mood and Affect: Mood normal.        Behavior: Behavior normal.      No results found for any visits on 03/22/24.  Assessment & Plan    Recurrent moderate major depressive disorder with anxiety (HCC) -     DULoxetine  HCl; Take 1 capsule (40 mg total) by mouth daily.  Dispense: 90 capsule; Refill: 1  Acute gastroenteritis  Chills -     POC COVID-19 BinaxNow -     POC Covid19/Flu A&B Antigen  Generalized body aches -     POC COVID-19 BinaxNow -     POC Covid19/Flu A&B Antigen  Irritable bowel syndrome with diarrhea      Recurrent moderate major depressive disorder with anxiety Significant improvement with current medication and therapy regimen.  No acute concerns.  Continue to monitor.   - Continue current medication regimen. - Continue weekly individual and group therapy sessions.  Acute gastroenteritis; chills; generalized body aches Acute diarrhea with dehydration risk. Negative COVID-19 test. No alarming symptoms like neck stiffness or chest pain. - Advise supportive care with OTC medications. - Encourage hydration with Pedialyte, Gatorade, or Powerade. - Recommend clear diet, avoid creamy soups. - Advise small, frequent meals. - Instruct to seek emergency care if neck stiffness or inability to move head occurs.  Irritable bowel syndrome with diarrhea Chronic IBS managed with Viberzi , generally effective in addressing his diarrhea but with mixed results during his current illness.    Return in about 6 weeks (around 05/06/2024) for Anx/Dep.      I discussed the assessment and treatment plan with the patient  The patient was provided an opportunity to ask questions and all were answered. The patient agreed with the plan and  demonstrated an understanding of the instructions.   The patient was advised to call back or seek an in-person evaluation if the symptoms worsen or if the condition fails to improve as anticipated.    LAURAINE LOISE BUOY, DO  [facility] [phone] (phone) [phone] (fax)  [facility]

## 2024-03-26 LAB — POC COVID19 BINAXNOW: SARS Coronavirus 2 Ag: NEGATIVE

## 2024-03-26 LAB — POCT INFLUENZA A/B
Influenza A, POC: NEGATIVE
Influenza B, POC: NEGATIVE

## 2024-03-29 ENCOUNTER — Telehealth: Payer: Self-pay

## 2024-03-29 NOTE — Telephone Encounter (Signed)
 Called patient to see if he was still needing the referral that was placed on 01/03/2024 for General Surgery.  He stated that he no longer needed it.

## 2024-04-24 ENCOUNTER — Ambulatory Visit: Attending: Medical | Admitting: Medical

## 2024-04-24 NOTE — Progress Notes (Deleted)
 Cardiology Office Note   Date:  04/24/2024  ID:  Sergio Garcia, DOB [birth year], MRN [id number] PCP: Donzella Lauraine SAILOR, DO  Sergio Garcia Cardiologist:  Deatrice Cage, MD { Click to update primary MD,subspecialty MD or APP then REFRESH:1}    History of Present Illness Sergio Garcia is a 48 y.o. 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    Remote treadmill test in 2019 at [facility] for atypical chest pain showed excellent exercise capacity, with the patient exercising for 11 minutes, and was negative for ischemia.    He was evaluated by Dr. Cage as a new patient in 05/2018 for palpitations and shortness of breath with associated extreme fatigue and dizziness. At that time, he reported a syncopal episofe, without injury, approximately 3 weeks prior while walking in his house. Heart monitor showed NSR with an average heart rate of 75bpm, 1 run of SVT lasting 4 beats along with rare PACs and PVCs. Echo showed an LVEF 60-65%, normal LVSF, diastolic dysfunction, mildly dilated aortic root and ascending aorta, no valvular abnormalities.    Patient was seen in May 2021 reporting exertional chest pain.  Lexiscan  showed no significant ischemia with T wave inversions noted on leads II, III, aVF, V5, and V6.  EF was 55 to 65%.  No significant coronary artery calcium noted on CT imaging.  Overall, this was a low risk study.   Patient was seen in follow-up in May 2021 with continued symptoms.  Right and left heart cath showed normal coronaries, normal LVEDP, right heart cath showed normal right and left filling pressures, normal pulmonary pressure, normal cardiac output.   He was seen in June 2021 reporting similar symptoms.  D-dimer was normal.  He underwent repeat echo which showed low normal LVSF with an EF of 50 to 55%,  no wall motion abnormalities, normal LV diastolic function, normal RV SF, trivial MR, and borderline dilation of the aortic root measuring 38 mm.  Repeat heart monitor showed predominantly normal sinus rhythm with an average heart rate of 76 bpm, 2 short runs of SVT with the longest lasting 8 beats with a rate of 144 bpm, rare PACs and PVCs.  CPX on 12/17/2019 showed his main functional limitation was due to deconditioning with no clear cardiopulmonary abnormality noted.   He was seen by his PCP in January 2022 reporting several day history of increasing shortness of breath and intermittent left-sided chest pain.  D-dimer was elevated and he was sent for CTA of the chest which showed subsegmental left-sided PE with evidence of right heart strain.  High-sensitivity troponin was negative x 2.  He was placed on IV heparin  and transition to oral Eliquis  prior to discharge.  Lower extremity ultrasound was positive for DVT in the left femoral vein. It was suspected DVT/PE or due to self initiating testosterone.   The patient was last seen 12/29/22 reporting sharp chest pain. CXR, echo and labs were ordered. Echo showed normal LVEF 55-60%, no WMA, low normal RVSF, apex appears mildly hypokinetic, borderline dilation of the ascending aorta measuring 38mm, borderline dilation of the aortic root measuring 40mm.  The patient was last seen 05/31/23 and was overall doing well from a cardiac perspective.   ROS: ***  Studies Reviewed      *** Risk Assessment/Calculations {Does this patient have ATRIAL FIBRILLATION?:[phone]}  No BP recorded.  {Refresh Note OR Click here to enter BP  :1}***       Physical Exam VS:  There were no vitals taken for this visit.       Wt Readings from Last 3 Encounters:  03/22/24 197 lb 11.2 oz (89.7 kg)  02/16/24 194 lb 12.8 oz (88.4 kg)  01/25/24 195 lb (88.5 kg)    GEN: Well nourished, well developed in no acute distress NECK: No JVD; No carotid bruits CARDIAC: ***RRR, no  murmurs, rubs, gallops RESPIRATORY:  Clear to auscultation without rales, wheezing or rhonchi  ABDOMEN: Soft, non-tender, non-distended EXTREMITIES:  No edema; No deformity   ASSESSMENT AND PLAN ***    {Are you ordering a CV Procedure (e.g. stress test, cath, DCCV, TEE, etc)?   Press F2        :[id number]}  Dispo: ***  Signed, Zeniyah Peaster VEAR Fishman, PA-C

## 2024-04-25 ENCOUNTER — Telehealth: Payer: Self-pay | Admitting: Family Medicine

## 2024-04-25 ENCOUNTER — Other Ambulatory Visit: Payer: Self-pay

## 2024-04-25 ENCOUNTER — Encounter: Payer: Self-pay | Admitting: Medical

## 2024-04-25 DIAGNOSIS — F331 Major depressive disorder, recurrent, moderate: Secondary | ICD-10-CM

## 2024-04-25 MED ORDER — DULOXETINE HCL 40 MG PO CPEP: 1.0000 | ORAL_CAPSULE | Freq: Every day | ORAL | 1 refills | Status: AC

## 2024-04-25 NOTE — Telephone Encounter (Signed)
 Converted to rx request

## 2024-04-25 NOTE — Telephone Encounter (Signed)
 [facility] [address] is requesting refills on Duloxetine  HCL DR 20 mg. #90

## 2024-05-14 ENCOUNTER — Encounter: Payer: Self-pay | Admitting: Family Medicine

## 2024-05-14 ENCOUNTER — Ambulatory Visit: Admitting: Family Medicine

## 2024-05-14 DIAGNOSIS — F32A Depression, unspecified: Secondary | ICD-10-CM

## 2024-05-14 DIAGNOSIS — F419 Anxiety disorder, unspecified: Secondary | ICD-10-CM | POA: Diagnosis not present

## 2024-05-14 DIAGNOSIS — F4310 Post-traumatic stress disorder, unspecified: Secondary | ICD-10-CM

## 2024-05-14 MED ORDER — QUETIAPINE FUMARATE ER 150 MG PO TB24: 150.0000 mg | ORAL_TABLET | Freq: Every day | ORAL | 1 refills | Status: AC

## 2024-05-14 NOTE — Patient Instructions (Signed)
 Please contact [facility] at the number listed below to schedule your colonoscopy.   [phone]

## 2024-05-14 NOTE — Progress Notes (Signed)
 "     Established patient visit   Patient: Sergio Garcia   DOB: 1975/10/12   48 y.o. Male  MRN: [id number] Visit Date: 05/14/2024  Today's healthcare provider: LAURAINE LOISE BUOY, DO   Chief Complaint  Patient presents with   Follow-up    Patient is here for a 6 week follow up on anxiety and depression.  States that he is doing good with it.   Subjective    HPI Sergio Garcia is a 48 year old male who presents for medication management and follow-up.  He is currently taking Buspirone  5 mg twice a day, Duloxetine  40 mg once daily, and Quetiapine  150 mg once daily at bedtime. He has completed his course of Naproxen  and is no longer experiencing the pain for which it was prescribed. However, he has been experiencing neck pain radiating into his right shoulder over the past two days. He has been managing this pain with OTC Tylenol /ibuprofen.  He continues to take Viberzi  for his IBS, which he takes immediately as needed. He has not used Zofran  recently.  He continues to attend individual counseling sessions every Thursday at 4 PM and attends group therapy every Tuesday from 6 to 8 PM. He finds these sessions beneficial and looks forward to them each week.  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 He reports no current issues with food insecurity, which was a concern in the past.      Medications: Show/hide medication list[1]       Objective    BP 123/84 (BP Location: Right Arm, Patient Position: Sitting, Cuff Size: Normal)   Pulse 85   Temp 98.5 F (36.9 C) (Oral)   Ht 6' 2 (1.88 m)   Wt 200 lb 3.2 oz (90.8 kg)   SpO2 99%   BMI 25.70 kg/m     Physical Exam Vitals and nursing note reviewed.  Constitutional:      General: He is not in acute distress.    Appearance: Normal appearance.  HENT:     Head: Normocephalic and atraumatic.  Eyes:     General: No scleral icterus.    Conjunctiva/sclera: Conjunctivae  normal.  Cardiovascular:     Rate and Rhythm: Normal rate.  Pulmonary:     Effort: Pulmonary effort is normal.  Neurological:     Mental Status: He is alert and oriented to person, place, and time. Mental status is at baseline.  Psychiatric:        Mood and Affect: Mood normal.        Behavior: Behavior normal.      No results found for any visits on 05/14/24.  Assessment & Plan    Anxiety and depression -     QUEtiapine  Fumarate ER; Take 1 tablet (150 mg total) by mouth at bedtime.  Dispense: 90 tablet; Refill: 1  PTSD (post-traumatic stress disorder) -     QUEtiapine  Fumarate ER; Take 1 tablet (150 mg total) by mouth at bedtime.  Dispense: 90 tablet; Refill: 1     Anxiety and depression Well-managed with current medication and counseling. He reported feeling good and consistently looking forward to therapy. - Continue buspirone  5 mg twice daily. - Continue quetiapine  150 mg at bedtime. - Continue duloxetine  40 mg daily. - Continue individual and group therapy sessions.  Post-traumatic stress disorder Managed with counseling. He reported therapy as a positive resource. - Continue individual and group therapy sessions.  Irritable bowel syndrome with diarrhea Managed with Viberzi  as needed. - Continue Viberzi  as needed. - Follows with GI; defer to specialist management.  Acute neck and right shoulder pain Likely due to sleeping position. Pain radiated from neck to shoulder, limiting range of motion. Preferred Tylenol  and stretching. - Continue OTC Tylenol /ibuprofen as needed for pain. - Encouraged stretching exercises to improve range of motion.  General health maintenance Due for colonoscopy, last performed in 2019 or 2020. - Schedule colonoscopy.    Return in about 6 months (around 11/08/2024) for CPE, Anx/Dep w/next provider.      I discussed the assessment and treatment plan with the patient  The patient was provided an opportunity to ask questions and all  were answered. The patient agreed with the plan and demonstrated an understanding of the instructions.   The patient was advised to call back or seek an in-person evaluation if the symptoms worsen or if the condition fails to improve as anticipated.    LAURAINE LOISE BUOY, DO  [facility] [phone] (phone) [phone] (fax)  [facility]    [1]  Outpatient Medications Prior to Visit  Medication Sig   busPIRone  (BUSPAR ) 5 MG tablet TAKE 1 TABLET BY MOUTH 2 TIMES DAILY AS NEEDED.   DULoxetine  HCl 40 MG CPEP Take 1 capsule (40 mg total) by mouth daily.   VIBERZI  75 MG TABS Take 1 tablet (75 mg total) by mouth 2 (two) times daily. (Patient taking differently: Take 75 mg by mouth 2 (two) times daily as needed (IBS).)   [DISCONTINUED] naproxen  (NAPROSYN ) 500 MG tablet Take 1 tablet (500 mg total) by mouth 2 (two) times daily with a meal.   [DISCONTINUED] ondansetron  (ZOFRAN ) 4 MG tablet Take 1 tablet (4 mg total) by mouth every 8 (eight) hours as needed for nausea or vomiting.   [DISCONTINUED] QUEtiapine  Fumarate (SEROQUEL  XR) 150 MG 24 hr tablet Take 1 tablet (150 mg total) by mouth at bedtime.   No facility-administered medications prior to visit.   "

## 2024-05-21 ENCOUNTER — Telehealth: Payer: Self-pay | Admitting: Gastroenterology

## 2024-05-21 NOTE — Telephone Encounter (Signed)
 Good afternoon Dr. Avram,    I received a call from this patient requesting to transfer his care from [facility] over to our practice due to that provider no longer seeing patient in the office and only seeing patient in the hospital setting. Patient records can be view on in EPIC. Would you please advise on Scheduling this patient.    Thank you.

## 2024-05-23 NOTE — Telephone Encounter (Signed)
 [facility] has new MD as of 12/1 and another starting 06/17/24  Given size of my practice and the new MD's I recommend he call there for an appointment

## 2024-05-24 NOTE — Telephone Encounter (Signed)
 Advised patient of the doctor's recommendations.

## 2024-05-25 ENCOUNTER — Encounter: Payer: Self-pay | Admitting: Family Medicine

## 2024-05-25 DIAGNOSIS — Z1212 Encounter for screening for malignant neoplasm of rectum: Secondary | ICD-10-CM

## 2024-06-05 ENCOUNTER — Telehealth: Payer: Self-pay

## 2024-06-05 ENCOUNTER — Other Ambulatory Visit: Payer: Self-pay

## 2024-06-05 DIAGNOSIS — Z1211 Encounter for screening for malignant neoplasm of colon: Secondary | ICD-10-CM

## 2024-06-05 MED ORDER — NA SULFATE-K SULFATE-MG SULF 17.5-3.13-1.6 GM/177ML PO SOLN: 1.0000 | Freq: Once | ORAL | 0 refills | Status: AC

## 2024-06-05 NOTE — Telephone Encounter (Signed)
 Gastroenterology Pre-Procedure Review  Request Date: 09/03/24 Requesting Physician: Dr. Melany  PATIENT REVIEW QUESTIONS: The patient responded to the following health history questions as indicated:    1. Are you having any GI issues? yes (IBS Appt has been scheduled to see Dr. Melany on 07/25/24) 2. Do you have a personal history of Polyps? no 3. Do you have a family history of Colon Cancer or Polyps? yes (mother and grandfather colon cancer and colon polyps) 4. Diabetes Mellitus? no 5. Joint replacements in the past 12 months?no 6. Major health problems in the past 3 months?no 7. Any artificial heart valves, MVP, or defibrillator?no    MEDICATIONS & ALLERGIES:    Patient reports the following regarding taking any anticoagulation/antiplatelet therapy:   Plavix, Coumadin, Eliquis , Xarelto , Lovenox, Pradaxa, Brilinta, or Effient? no Aspirin ? no  Patient confirms/reports the following medications:  Current Outpatient Medications  Medication Sig Dispense Refill   busPIRone  (BUSPAR ) 5 MG tablet TAKE 1 TABLET BY MOUTH 2 TIMES DAILY AS NEEDED. 180 tablet 1   DULoxetine  HCl 40 MG CPEP Take 1 capsule (40 mg total) by mouth daily. 90 capsule 1   QUEtiapine  Fumarate (SEROQUEL  XR) 150 MG 24 hr tablet Take 1 tablet (150 mg total) by mouth at bedtime. 90 tablet 1   VIBERZI  75 MG TABS Take 1 tablet (75 mg total) by mouth 2 (two) times daily. (Patient taking differently: Take 75 mg by mouth 2 (two) times daily as needed (IBS).) 180 tablet 0   No current facility-administered medications for this visit.    Patient confirms/reports the following allergies:  Allergies[1]  No orders of the defined types were placed in this encounter.   AUTHORIZATION INFORMATION Primary Insurance: 1D#: Group #:  Secondary Insurance: 1D#: Group #:  SCHEDULE INFORMATION: Date: 09/03/24 Time: Location: [facility]    [1]  Allergies Allergen Reactions   Morphine And Codeine Anaphylaxis   Testosterone      Dvt/PE   Morphine Nausea And Vomiting   Tdap [Tetanus-Diphth-Acell Pertussis] Hives and Rash

## 2024-06-18 ENCOUNTER — Ambulatory Visit: Admitting: Medical

## 2024-06-18 NOTE — Progress Notes (Unsigned)
 " Cardiology Office Note   Date:  06/18/2024  ID:  ELMO RIO, DOB May 23, 1976, MRN [id number] PCP: Donzella Lauraine SAILOR, DO  [facility] Providers Cardiologist:  Deatrice Cage, MD { Click to update primary MD,subspecialty MD or APP then REFRESH:1}    History of Present Illness Sergio Garcia is a 49 y.o. 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    Remote treadmill test in 2019 at [facility] for atypical chest pain showed excellent exercise capacity, with the patient exercising for 11 minutes, and was negative for ischemia.    He was evaluated by Dr. Cage as a new patient in 05/2018 for palpitations and shortness of breath with associated extreme fatigue and dizziness. At that time, he reported a syncopal episofe, without injury, approximately 3 weeks prior while walking in his house. Heart monitor showed NSR with an average heart rate of 75bpm, 1 run of SVT lasting 4 beats along with rare PACs and PVCs. Echo showed an LVEF 60-65%, normal LVSF, diastolic dysfunction, mildly dilated aortic root and ascending aorta, no valvular abnormalities.    Patient was seen in May 2021 reporting exertional chest pain.  Lexiscan  showed no significant ischemia with T wave inversions noted on leads II, III, aVF, V5, and V6.  EF was 55 to 65%.  No significant coronary artery calcium noted on CT imaging.  Overall, this was a low risk study.   Patient was seen in follow-up in May 2021 with continued symptoms.  Right and left heart cath showed normal coronaries, normal LVEDP, right heart cath showed normal right and left filling pressures, normal pulmonary pressure, normal cardiac output.   He was seen in June 2021 reporting similar symptoms.  D-dimer was normal.  He underwent repeat echo which showed low normal LVSF with an EF of 50 to 55%,  no wall motion abnormalities, normal LV diastolic function, normal RV SF, trivial MR, and borderline dilation of the aortic root measuring 38 mm.  Repeat heart monitor showed predominantly normal sinus rhythm with an average heart rate of 76 bpm, 2 short runs of SVT with the longest lasting 8 beats with a rate of 144 bpm, rare PACs and PVCs.  CPX on 12/17/2019 showed his main functional limitation was due to deconditioning with no clear cardiopulmonary abnormality noted.   He was seen by his PCP in January 2022 reporting several day history of increasing shortness of breath and intermittent left-sided chest pain.  D-dimer was elevated and he was sent for CTA of the chest which showed subsegmental left-sided PE with evidence of right heart strain.  High-sensitivity troponin was negative x 2.  He was placed on IV heparin  and transition to oral Eliquis  prior to discharge.  Lower extremity ultrasound was positive for DVT in the left femoral vein. It was suspected DVT/PE or due to self initiating testosterone.   The patient was seen 12/29/22 reporting sharp chest pain. CXR, echo and labs were ordered. Echo showed normal LVEF 55-60%, no WMA, low normal RVSF, apex appears mildly hypokinetic, borderline dilation of the ascending aorta measuring 38mm, borderline dilation of the aortic root measuring 40mm.   The patient was last seen 05/2022 for pre-operative cardiac evaluation.  ROS: ***  Studies Reviewed      *** Risk Assessment/Calculations {Does this patient have ATRIAL FIBRILLATION?[phone]} No BP recorded.  {Refresh  Note OR Click here to enter BP  :1}***       Physical Exam VS:  There were no vitals taken for this visit.       Wt Readings from Last 3 Encounters:  05/14/24 200 lb 3.2 oz (90.8 kg)  03/22/24 197 lb 11.2 oz (89.7 kg)  02/16/24 194 lb 12.8 oz (88.4 kg)    GEN: Well nourished, well developed in no acute distress NECK: No JVD; No carotid bruits CARDIAC: ***RRR, no murmurs, rubs,  gallops RESPIRATORY:  Clear to auscultation without rales, wheezing or rhonchi  ABDOMEN: Soft, non-tender, non-distended EXTREMITIES:  No edema; No deformity   ASSESSMENT AND PLAN ***    {Are you ordering a CV Procedure (e.g. stress test, cath, DCCV, TEE, etc)?   Press F2        :[id number]}  Dispo: ***  Signed, Psalms Olarte VEAR Fishman, PA-C   "

## 2024-06-26 ENCOUNTER — Ambulatory Visit: Admitting: Medical

## 2024-06-26 NOTE — Progress Notes (Unsigned)
 " Cardiology Office Note   Date:  06/26/2024  ID:  Sergio Garcia, DOB 1975-06-17, MRN [id number] PCP: Sergio Lauraine SAILOR, DO  [facility] Providers Cardiologist:  Deatrice Cage, MD { Click to update primary MD,subspecialty MD or APP then REFRESH:1}    History of Present Illness Sergio Garcia is a 49 y.o. 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    Remote treadmill test in 2019 at [facility] for atypical chest pain showed excellent exercise capacity, with the patient exercising for 11 minutes, and was negative for ischemia.    He was evaluated by Dr. Cage as a new patient in 05/2018 for palpitations and shortness of breath with associated extreme fatigue and dizziness. At that time, he reported a syncopal episofe, without injury, approximately 3 weeks prior while walking in his house. Heart monitor showed NSR with an average heart rate of 75bpm, 1 run of SVT lasting 4 beats along with rare PACs and PVCs. Echo showed an LVEF 60-65%, normal LVSF, diastolic dysfunction, mildly dilated aortic root and ascending aorta, no valvular abnormalities.    Patient was seen in May 2021 reporting exertional chest pain.  Lexiscan  showed no significant ischemia with T wave inversions noted on leads II, III, aVF, V5, and V6.  EF was 55 to 65%.  No significant coronary artery calcium noted on CT imaging.  Overall, this was a low risk study.   Patient was seen in follow-up in May 2021 with continued symptoms.  Right and left heart cath showed normal coronaries, normal LVEDP, right heart cath showed normal right and left filling pressures, normal pulmonary pressure, normal cardiac output.   He was seen in June 2021 reporting similar symptoms.  D-dimer was normal.  He underwent repeat echo which showed low normal LVSF with an EF of 50 to 55%,  no wall motion abnormalities, normal LV diastolic function, normal RV SF, trivial MR, and borderline dilation of the aortic root measuring 38 mm.  Repeat heart monitor showed predominantly normal sinus rhythm with an average heart rate of 76 bpm, 2 short runs of SVT with the longest lasting 8 beats with a rate of 144 bpm, rare PACs and PVCs.  CPX on 12/17/2019 showed his main functional limitation was due to deconditioning with no clear cardiopulmonary abnormality noted.   He was seen by his PCP in January 2022 reporting several day history of increasing shortness of breath and intermittent left-sided chest pain.  D-dimer was elevated and he was sent for CTA of the chest which showed subsegmental left-sided PE with evidence of right heart strain.  High-sensitivity troponin was negative x 2.  He was placed on IV heparin  and transition to oral Eliquis  prior to discharge.  Lower extremity ultrasound was positive for DVT in the left femoral vein. It was suspected DVT/PE or due to self initiating testosterone.   The patient was seen 12/29/22 reporting sharp chest pain. CXR, echo and labs were ordered. Echo showed normal LVEF 55-60%, no WMA, low normal RVSF, apex appears mildly hypokinetic, borderline dilation of the ascending aorta measuring 38mm, borderline dilation of the aortic root measuring 40mm.  The patient was last seen 05/31/23 for preop evaluation prior to cervical neck fusion.  ROS: ***  Studies Reviewed      *** Risk Assessment/Calculations {Does this patient have ATRIAL FIBRILLATION?:[phone]} No  BP recorded.  {Refresh Note OR Click here to enter BP  :1}***       Physical Exam VS:  There were no vitals taken for this visit.       Wt Readings from Last 3 Encounters:  05/14/24 200 lb 3.2 oz (90.8 kg)  03/22/24 197 lb 11.2 oz (89.7 kg)  02/16/24 194 lb 12.8 oz (88.4 kg)    GEN: Well nourished, well developed in no acute distress NECK: No JVD; No carotid bruits CARDIAC: ***RRR, no  murmurs, rubs, gallops RESPIRATORY:  Clear to auscultation without rales, wheezing or rhonchi  ABDOMEN: Soft, non-tender, non-distended EXTREMITIES:  No edema; No deformity   ASSESSMENT AND PLAN ***    {Are you ordering a CV Procedure (e.g. stress test, cath, DCCV, TEE, etc)?   Press F2        :[id number]}  Dispo: ***  Signed, Alexsis Kathman VEAR Fishman, PA-C   "

## 2024-06-28 ENCOUNTER — Encounter: Payer: Self-pay | Admitting: Medical

## 2024-07-25 ENCOUNTER — Ambulatory Visit: Admitting: Gastroenterology

## 2024-09-03 ENCOUNTER — Ambulatory Visit: Admit: 2024-09-03 | Admitting: Gastroenterology

## 2024-09-03 SURGERY — COLONOSCOPY
Anesthesia: General
